# Patient Record
Sex: Male | Born: 1935 | Race: White | Hispanic: No | Marital: Married | Smoking: Former smoker
Health system: Southern US, Community
[De-identification: ages and names within clinical notes are randomized; demographics above are authoritative.]

## PROBLEM LIST (undated history)

## (undated) DIAGNOSIS — E785 Hyperlipidemia, unspecified: Secondary | ICD-10-CM

## (undated) DIAGNOSIS — T7840XA Allergy, unspecified, initial encounter: Secondary | ICD-10-CM

## (undated) DIAGNOSIS — N529 Male erectile dysfunction, unspecified: Secondary | ICD-10-CM

## (undated) DIAGNOSIS — H3320 Serous retinal detachment, unspecified eye: Secondary | ICD-10-CM

## (undated) DIAGNOSIS — K3189 Other diseases of stomach and duodenum: Secondary | ICD-10-CM

## (undated) DIAGNOSIS — G450 Vertebro-basilar artery syndrome: Secondary | ICD-10-CM

## (undated) DIAGNOSIS — G3184 Mild cognitive impairment, so stated: Secondary | ICD-10-CM

## (undated) DIAGNOSIS — I10 Essential (primary) hypertension: Secondary | ICD-10-CM

## (undated) DIAGNOSIS — A0472 Enterocolitis due to Clostridium difficile, not specified as recurrent: Secondary | ICD-10-CM

## (undated) DIAGNOSIS — M1990 Unspecified osteoarthritis, unspecified site: Secondary | ICD-10-CM

## (undated) DIAGNOSIS — K529 Noninfective gastroenteritis and colitis, unspecified: Secondary | ICD-10-CM

## (undated) DIAGNOSIS — J45909 Unspecified asthma, uncomplicated: Secondary | ICD-10-CM

## (undated) DIAGNOSIS — I739 Peripheral vascular disease, unspecified: Secondary | ICD-10-CM

## (undated) DIAGNOSIS — I2510 Atherosclerotic heart disease of native coronary artery without angina pectoris: Secondary | ICD-10-CM

## (undated) DIAGNOSIS — K219 Gastro-esophageal reflux disease without esophagitis: Secondary | ICD-10-CM

## (undated) DIAGNOSIS — H269 Unspecified cataract: Secondary | ICD-10-CM

## (undated) DIAGNOSIS — I6529 Occlusion and stenosis of unspecified carotid artery: Secondary | ICD-10-CM

## (undated) DIAGNOSIS — R42 Dizziness and giddiness: Secondary | ICD-10-CM

## (undated) HISTORY — DX: Enterocolitis due to Clostridium difficile, not specified as recurrent: A04.72

## (undated) HISTORY — DX: Mild cognitive impairment of uncertain or unknown etiology: G31.84

## (undated) HISTORY — DX: Allergy, unspecified, initial encounter: T78.40XA

## (undated) HISTORY — DX: Peripheral vascular disease, unspecified: I73.9

## (undated) HISTORY — DX: Unspecified cataract: H26.9

## (undated) HISTORY — DX: Vertebro-basilar artery syndrome: G45.0

## (undated) HISTORY — PX: RETINAL DETACHMENT SURGERY: SHX105

## (undated) HISTORY — DX: Serous retinal detachment, unspecified eye: H33.20

## (undated) HISTORY — DX: Gastro-esophageal reflux disease without esophagitis: K21.9

## (undated) HISTORY — DX: Essential (primary) hypertension: I10

## (undated) HISTORY — PX: EYE SURGERY: SHX253

## (undated) HISTORY — DX: Atherosclerotic heart disease of native coronary artery without angina pectoris: I25.10

## (undated) HISTORY — DX: Hyperlipidemia, unspecified: E78.5

## (undated) HISTORY — DX: Unspecified osteoarthritis, unspecified site: M19.90

## (undated) HISTORY — PX: UPPER GASTROINTESTINAL ENDOSCOPY: SHX188

## (undated) HISTORY — DX: Unspecified asthma, uncomplicated: J45.909

## (undated) HISTORY — DX: Male erectile dysfunction, unspecified: N52.9

## (undated) HISTORY — DX: Occlusion and stenosis of unspecified carotid artery: I65.29

## (undated) HISTORY — DX: Dizziness and giddiness: R42

## (undated) HISTORY — PX: COLONOSCOPY: SHX174

## (undated) HISTORY — DX: Noninfective gastroenteritis and colitis, unspecified: K52.9

---
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
## 1999-12-06 ENCOUNTER — Encounter: Payer: Self-pay | Admitting: Family Medicine

## 1999-12-06 ENCOUNTER — Encounter: Admission: RE | Admit: 1999-12-06 | Discharge: 1999-12-06 | Payer: Self-pay | Admitting: Family Medicine

## 2000-11-09 ENCOUNTER — Encounter: Admission: RE | Admit: 2000-11-09 | Discharge: 2001-02-07 | Payer: Self-pay | Admitting: Internal Medicine

## 2001-02-26 ENCOUNTER — Encounter: Admission: RE | Admit: 2001-02-26 | Discharge: 2001-02-26 | Payer: Self-pay | Admitting: Family Medicine

## 2001-02-26 ENCOUNTER — Encounter: Payer: Self-pay | Admitting: Family Medicine

## 2001-06-01 ENCOUNTER — Encounter: Admission: RE | Admit: 2001-06-01 | Discharge: 2001-08-30 | Payer: Self-pay | Admitting: Internal Medicine

## 2003-04-25 ENCOUNTER — Encounter: Payer: Self-pay | Admitting: Family Medicine

## 2003-04-25 ENCOUNTER — Encounter: Admission: RE | Admit: 2003-04-25 | Discharge: 2003-04-25 | Payer: Self-pay | Admitting: Family Medicine

## 2003-10-28 DIAGNOSIS — I2510 Atherosclerotic heart disease of native coronary artery without angina pectoris: Secondary | ICD-10-CM

## 2003-10-28 HISTORY — DX: Atherosclerotic heart disease of native coronary artery without angina pectoris: I25.10

## 2004-03-11 ENCOUNTER — Ambulatory Visit (HOSPITAL_COMMUNITY): Admission: RE | Admit: 2004-03-11 | Discharge: 2004-03-12 | Payer: Self-pay | Admitting: Cardiology

## 2004-11-04 ENCOUNTER — Encounter: Admission: RE | Admit: 2004-11-04 | Discharge: 2004-11-04 | Payer: Self-pay | Admitting: Cardiology

## 2005-10-27 HISTORY — PX: PR VEIN BYPASS GRAFT,AORTO-FEM-POP: 35551

## 2006-10-06 ENCOUNTER — Ambulatory Visit: Payer: Self-pay | Admitting: Family Medicine

## 2006-10-06 LAB — CONVERTED CEMR LAB
ALT: 28 units/L (ref 0–40)
AST: 29 units/L (ref 0–37)
Albumin: 3.3 g/dL — ABNORMAL LOW (ref 3.5–5.2)
Alkaline Phosphatase: 55 units/L (ref 39–117)
BUN: 11 mg/dL (ref 6–23)
CO2: 31 meq/L (ref 19–32)
Calcium: 9.1 mg/dL (ref 8.4–10.5)
Chloride: 97 meq/L (ref 96–112)
Creatinine, Ser: 1.2 mg/dL (ref 0.4–1.5)
GFR calc non Af Amer: 64 mL/min
Glomerular Filtration Rate, Af Am: 77 mL/min/{1.73_m2}
Glucose, Bld: 94 mg/dL (ref 70–99)
Potassium: 3.4 meq/L — ABNORMAL LOW (ref 3.5–5.1)
Sodium: 138 meq/L (ref 135–145)
Total Bilirubin: 0.8 mg/dL (ref 0.3–1.2)
Total Protein: 7 g/dL (ref 6.0–8.3)

## 2006-10-19 ENCOUNTER — Ambulatory Visit: Payer: Self-pay | Admitting: Family Medicine

## 2006-10-19 LAB — CONVERTED CEMR LAB: Potassium: 3.6 meq/L (ref 3.5–5.1)

## 2006-10-29 ENCOUNTER — Ambulatory Visit: Payer: Self-pay | Admitting: Gastroenterology

## 2006-11-17 ENCOUNTER — Ambulatory Visit: Payer: Self-pay | Admitting: Family Medicine

## 2006-11-17 LAB — CONVERTED CEMR LAB
BUN: 13 mg/dL (ref 6–23)
GFR calc Af Amer: 70 mL/min
Potassium: 4 meq/L (ref 3.5–5.1)

## 2006-11-20 ENCOUNTER — Encounter (INDEPENDENT_AMBULATORY_CARE_PROVIDER_SITE_OTHER): Payer: Self-pay | Admitting: *Deleted

## 2006-11-20 ENCOUNTER — Ambulatory Visit: Payer: Self-pay | Admitting: Gastroenterology

## 2006-11-20 LAB — HM COLONOSCOPY

## 2006-11-23 ENCOUNTER — Encounter: Payer: Self-pay | Admitting: Gastroenterology

## 2006-12-24 ENCOUNTER — Ambulatory Visit: Payer: Self-pay | Admitting: Gastroenterology

## 2006-12-24 LAB — CONVERTED CEMR LAB
ALT: 37 units/L (ref 0–40)
Albumin: 3.8 g/dL (ref 3.5–5.2)
Alkaline Phosphatase: 54 units/L (ref 39–117)
BUN: 16 mg/dL (ref 6–23)
Basophils Absolute: 0 10*3/uL (ref 0.0–0.1)
CO2: 32 meq/L (ref 19–32)
Calcium: 9.1 mg/dL (ref 8.4–10.5)
Eosinophils Relative: 2.5 % (ref 0.0–5.0)
Ferritin: 58 ng/mL (ref 22.0–322.0)
GFR calc Af Amer: 172 mL/min
HCT: 40.9 % (ref 39.0–52.0)
MCHC: 34.1 g/dL (ref 30.0–36.0)
Neutrophils Relative %: 65.7 % (ref 43.0–77.0)
RBC: 4.61 M/uL (ref 4.22–5.81)
RDW: 12.7 % (ref 11.5–14.6)
Saturation Ratios: 24.9 % (ref 20.0–50.0)
Sed Rate: 10 mm/hr (ref 0–20)
TSH: 2.39 microintl units/mL (ref 0.35–5.50)
Tissue Transglutaminase Ab, IgA: <3 units (ref <5)
Total Protein: 6.8 g/dL (ref 6.0–8.3)
WBC: 5.8 10*3/uL (ref 4.5–10.5)

## 2006-12-25 ENCOUNTER — Encounter: Payer: Self-pay | Admitting: Gastroenterology

## 2006-12-30 ENCOUNTER — Ambulatory Visit: Payer: Self-pay | Admitting: Vascular Surgery

## 2007-01-22 ENCOUNTER — Ambulatory Visit: Payer: Self-pay | Admitting: Family Medicine

## 2007-01-22 LAB — CONVERTED CEMR LAB
BUN: 14 mg/dL (ref 6–23)
Bilirubin, Direct: 0.1 mg/dL (ref 0.0–0.3)
CO2: 33 meq/L — ABNORMAL HIGH (ref 19–32)
GFR calc Af Amer: 108 mL/min
Glucose, Bld: 88 mg/dL (ref 70–99)
Potassium: 3.8 meq/L (ref 3.5–5.1)
Sodium: 142 meq/L (ref 135–145)
TSH: 2.78 microintl units/mL (ref 0.35–5.50)
Total Bilirubin: 1 mg/dL (ref 0.3–1.2)
Total Protein: 6.9 g/dL (ref 6.0–8.3)

## 2007-01-26 ENCOUNTER — Ambulatory Visit: Payer: Self-pay | Admitting: Gastroenterology

## 2007-03-10 ENCOUNTER — Ambulatory Visit: Payer: Self-pay | Admitting: Family Medicine

## 2007-03-10 LAB — CONVERTED CEMR LAB
Basophils Relative: 0.5 % (ref 0.0–1.0)
Eosinophils Absolute: 0.1 10*3/uL (ref 0.0–0.6)
Eosinophils Relative: 2 % (ref 0.0–5.0)
Monocytes Relative: 10.7 % (ref 3.0–11.0)
Platelets: 247 10*3/uL (ref 150–400)
RBC: 4.93 M/uL (ref 4.22–5.81)
WBC: 6.1 10*3/uL (ref 4.5–10.5)

## 2007-03-17 DIAGNOSIS — K5289 Other specified noninfective gastroenteritis and colitis: Secondary | ICD-10-CM | POA: Insufficient documentation

## 2007-03-17 DIAGNOSIS — I10 Essential (primary) hypertension: Secondary | ICD-10-CM | POA: Insufficient documentation

## 2007-03-17 DIAGNOSIS — E785 Hyperlipidemia, unspecified: Secondary | ICD-10-CM | POA: Insufficient documentation

## 2007-03-23 ENCOUNTER — Ambulatory Visit: Payer: Self-pay | Admitting: Psychology

## 2007-03-30 ENCOUNTER — Ambulatory Visit: Payer: Self-pay | Admitting: Gastroenterology

## 2007-04-09 ENCOUNTER — Encounter (INDEPENDENT_AMBULATORY_CARE_PROVIDER_SITE_OTHER): Payer: Self-pay | Admitting: Family Medicine

## 2007-04-27 DIAGNOSIS — G3184 Mild cognitive impairment, so stated: Secondary | ICD-10-CM | POA: Insufficient documentation

## 2007-06-08 ENCOUNTER — Telehealth (INDEPENDENT_AMBULATORY_CARE_PROVIDER_SITE_OTHER): Payer: Self-pay | Admitting: *Deleted

## 2007-06-29 ENCOUNTER — Ambulatory Visit: Payer: Self-pay | Admitting: Gastroenterology

## 2007-08-02 ENCOUNTER — Ambulatory Visit: Payer: Self-pay | Admitting: Internal Medicine

## 2007-08-02 DIAGNOSIS — I739 Peripheral vascular disease, unspecified: Secondary | ICD-10-CM | POA: Insufficient documentation

## 2007-08-02 DIAGNOSIS — G450 Vertebro-basilar artery syndrome: Secondary | ICD-10-CM | POA: Insufficient documentation

## 2007-08-02 DIAGNOSIS — R42 Dizziness and giddiness: Secondary | ICD-10-CM | POA: Insufficient documentation

## 2007-08-17 ENCOUNTER — Telehealth (INDEPENDENT_AMBULATORY_CARE_PROVIDER_SITE_OTHER): Payer: Self-pay | Admitting: *Deleted

## 2007-08-18 ENCOUNTER — Ambulatory Visit: Payer: Self-pay | Admitting: Family Medicine

## 2007-08-19 ENCOUNTER — Telehealth (INDEPENDENT_AMBULATORY_CARE_PROVIDER_SITE_OTHER): Payer: Self-pay | Admitting: *Deleted

## 2007-08-19 ENCOUNTER — Ambulatory Visit: Payer: Self-pay | Admitting: Family Medicine

## 2007-08-19 DIAGNOSIS — H60509 Unspecified acute noninfective otitis externa, unspecified ear: Secondary | ICD-10-CM | POA: Insufficient documentation

## 2007-08-20 ENCOUNTER — Telehealth (INDEPENDENT_AMBULATORY_CARE_PROVIDER_SITE_OTHER): Payer: Self-pay | Admitting: *Deleted

## 2007-08-27 ENCOUNTER — Telehealth (INDEPENDENT_AMBULATORY_CARE_PROVIDER_SITE_OTHER): Payer: Self-pay | Admitting: *Deleted

## 2007-08-30 ENCOUNTER — Ambulatory Visit: Payer: Self-pay | Admitting: Family Medicine

## 2007-09-28 ENCOUNTER — Encounter (INDEPENDENT_AMBULATORY_CARE_PROVIDER_SITE_OTHER): Payer: Self-pay | Admitting: Family Medicine

## 2007-10-18 ENCOUNTER — Ambulatory Visit: Payer: Self-pay | Admitting: Family Medicine

## 2007-10-18 DIAGNOSIS — J069 Acute upper respiratory infection, unspecified: Secondary | ICD-10-CM | POA: Insufficient documentation

## 2007-12-13 ENCOUNTER — Ambulatory Visit: Payer: Self-pay | Admitting: Family Medicine

## 2007-12-14 ENCOUNTER — Telehealth (INDEPENDENT_AMBULATORY_CARE_PROVIDER_SITE_OTHER): Payer: Self-pay | Admitting: *Deleted

## 2007-12-14 ENCOUNTER — Encounter (INDEPENDENT_AMBULATORY_CARE_PROVIDER_SITE_OTHER): Payer: Self-pay | Admitting: *Deleted

## 2007-12-14 LAB — CONVERTED CEMR LAB
ALT: 32 units/L (ref 0–53)
Chloride: 104 meq/L (ref 96–112)
Cholesterol: 120 mg/dL (ref 0–200)
Creatinine, Ser: 1.1 mg/dL (ref 0.4–1.5)
Glucose, Bld: 103 mg/dL — ABNORMAL HIGH (ref 70–99)
HDL: 43.1 mg/dL (ref >39.0)
LDL Cholesterol: 64 mg/dL (ref 0–99)
Potassium: 4.4 meq/L (ref 3.5–5.1)
Sodium: 141 meq/L (ref 135–145)
Triglycerides: 66 mg/dL (ref 0–149)
VLDL: 13 mg/dL (ref 0–40)

## 2007-12-20 ENCOUNTER — Telehealth (INDEPENDENT_AMBULATORY_CARE_PROVIDER_SITE_OTHER): Payer: Self-pay | Admitting: Family Medicine

## 2007-12-20 ENCOUNTER — Ambulatory Visit: Payer: Self-pay | Admitting: Family Medicine

## 2007-12-20 ENCOUNTER — Encounter: Admission: RE | Admit: 2007-12-20 | Discharge: 2007-12-20 | Payer: Self-pay | Admitting: Family Medicine

## 2008-01-11 ENCOUNTER — Ambulatory Visit: Payer: Self-pay | Admitting: Vascular Surgery

## 2008-10-06 ENCOUNTER — Telehealth (INDEPENDENT_AMBULATORY_CARE_PROVIDER_SITE_OTHER): Payer: Self-pay | Admitting: *Deleted

## 2008-12-05 ENCOUNTER — Ambulatory Visit: Payer: Self-pay | Admitting: Family Medicine

## 2008-12-06 ENCOUNTER — Encounter (INDEPENDENT_AMBULATORY_CARE_PROVIDER_SITE_OTHER): Payer: Self-pay | Admitting: *Deleted

## 2008-12-06 LAB — CONVERTED CEMR LAB
Albumin: 4.1 g/dL (ref 3.5–5.2)
BUN: 16 mg/dL (ref 6–23)
Calcium: 9.5 mg/dL (ref 8.4–10.5)
Chloride: 102 meq/L (ref 96–112)
Cholesterol: 112 mg/dL (ref 0–200)
Creatinine, Ser: 1.2 mg/dL (ref 0.4–1.5)
GFR calc Af Amer: 77 mL/min
GFR calc non Af Amer: 63 mL/min
LDL Cholesterol: 56 mg/dL (ref 0–99)
Total Bilirubin: 1.1 mg/dL (ref 0.3–1.2)
Total CHOL/HDL Ratio: 3
Triglycerides: 94 mg/dL (ref 0–149)
VLDL: 19 mg/dL (ref 0–40)

## 2009-06-05 ENCOUNTER — Ambulatory Visit: Payer: Self-pay | Admitting: Family Medicine

## 2009-06-05 DIAGNOSIS — L989 Disorder of the skin and subcutaneous tissue, unspecified: Secondary | ICD-10-CM | POA: Insufficient documentation

## 2009-06-06 ENCOUNTER — Encounter (INDEPENDENT_AMBULATORY_CARE_PROVIDER_SITE_OTHER): Payer: Self-pay | Admitting: *Deleted

## 2009-06-06 LAB — CONVERTED CEMR LAB
ALT: 39 units/L (ref 0–53)
Albumin: 4.1 g/dL (ref 3.5–5.2)
BUN: 17 mg/dL (ref 6–23)
Basophils Relative: 0.3 % (ref 0.0–3.0)
Bilirubin, Direct: 0.2 mg/dL (ref 0.0–0.3)
Chloride: 104 meq/L (ref 96–112)
Cholesterol: 110 mg/dL (ref 0–200)
Eosinophils Relative: 4 % (ref 0.0–5.0)
GFR calc non Af Amer: 69.94 mL/min (ref >60)
HCT: 43.9 % (ref 39.0–52.0)
HDL: 41.2 mg/dL (ref >39.00)
LDL Cholesterol: 52 mg/dL (ref 0–99)
Lymphs Abs: 1.7 10*3/uL (ref 0.7–4.0)
Monocytes Relative: 9.4 % (ref 3.0–12.0)
PSA: 2.37 ng/mL (ref 0.10–4.00)
Platelets: 181 10*3/uL (ref 150.0–400.0)
Potassium: 4.3 meq/L (ref 3.5–5.1)
RBC: 4.93 M/uL (ref 4.22–5.81)
Sodium: 143 meq/L (ref 135–145)
TSH: 2.3 microintl units/mL (ref 0.35–5.50)
Total Protein: 7.1 g/dL (ref 6.0–8.3)
Triglycerides: 84 mg/dL (ref 0.0–149.0)
VLDL: 16.8 mg/dL (ref 0.0–40.0)
WBC: 6.9 10*3/uL (ref 4.5–10.5)

## 2009-06-26 ENCOUNTER — Telehealth (INDEPENDENT_AMBULATORY_CARE_PROVIDER_SITE_OTHER): Payer: Self-pay | Admitting: *Deleted

## 2009-07-26 ENCOUNTER — Encounter: Payer: Self-pay | Admitting: Family Medicine

## 2009-09-27 ENCOUNTER — Encounter: Payer: Self-pay | Admitting: Internal Medicine

## 2009-11-23 ENCOUNTER — Ambulatory Visit: Payer: Self-pay | Admitting: Internal Medicine

## 2009-11-23 ENCOUNTER — Ambulatory Visit: Payer: Self-pay | Admitting: Family

## 2009-11-23 DIAGNOSIS — R053 Chronic cough: Secondary | ICD-10-CM | POA: Insufficient documentation

## 2009-11-23 DIAGNOSIS — R05 Cough: Secondary | ICD-10-CM

## 2009-12-05 ENCOUNTER — Ambulatory Visit: Payer: Self-pay | Admitting: Family

## 2009-12-05 LAB — CONVERTED CEMR LAB
ALT: 28 U/L
AST: 28 U/L
Albumin: 4 g/dL
Alkaline Phosphatase: 61 U/L
BUN: 16 mg/dL
Bilirubin, Direct: 0.1 mg/dL
CO2: 31 meq/L
Calcium: 9.2 mg/dL
Chloride: 104 meq/L
Cholesterol: 104 mg/dL
Creatinine, Ser: 0.9 mg/dL
GFR calc non Af Amer: 88.04 mL/min
Glucose, Bld: 76 mg/dL
HDL: 41.4 mg/dL
LDL Cholesterol: 42 mg/dL
Potassium: 4.1 meq/L
Sodium: 140 meq/L
Total Bilirubin: 0.5 mg/dL
Total CHOL/HDL Ratio: 3
Total Protein: 6.9 g/dL
Triglycerides: 102 mg/dL
VLDL: 20.4 mg/dL

## 2010-07-11 ENCOUNTER — Ambulatory Visit: Payer: Self-pay | Admitting: Family Medicine

## 2010-08-14 ENCOUNTER — Ambulatory Visit: Payer: Self-pay | Admitting: Vascular Surgery

## 2010-10-04 ENCOUNTER — Encounter: Payer: Self-pay | Admitting: Family Medicine

## 2010-10-04 ENCOUNTER — Encounter (INDEPENDENT_AMBULATORY_CARE_PROVIDER_SITE_OTHER): Payer: Self-pay | Admitting: *Deleted

## 2010-10-04 LAB — CONVERTED CEMR LAB
Alkaline Phosphatase: 61 units/L
BUN: 15 mg/dL
CO2, serum: 33 mmol/L
Chloride, Serum: 104 mmol/L
Cholesterol: 118 mg/dL
Creatinine, Ser: 0.97 mg/dL
HDL: 51 mg/dL
LDL Cholesterol: 49 mg/dL
Potassium, serum: 4.5 mmol/L
Sodium, serum: 140 mmol/L
Total Bilirubin: 0.6 mg/dL
Triglycerides: 83 mg/dL

## 2010-10-15 ENCOUNTER — Encounter (INDEPENDENT_AMBULATORY_CARE_PROVIDER_SITE_OTHER): Payer: Self-pay | Admitting: *Deleted

## 2010-10-29 ENCOUNTER — Ambulatory Visit
Admission: RE | Admit: 2010-10-29 | Discharge: 2010-10-29 | Payer: Self-pay | Source: Home / Self Care | Attending: Family Medicine | Admitting: Family Medicine

## 2010-11-17 ENCOUNTER — Encounter: Payer: Self-pay | Admitting: Neurology

## 2010-11-26 NOTE — Assessment & Plan Note (Signed)
Summary: 2 week fu/kdc   Vital Signs:  Patient profile:   75 year old male Weight:      159 pounds Pulse rate:   74 / minute BP sitting:   128 / 70  (left arm)  Vitals Entered By: Doristine Devoid (December 05, 2009 10:45 AM) CC: 2 week roa    CC:  2 week roa .  History of Present Illness: Mr [name] is a 77 year old male who presents today for follow up of his cough.  Last visit his was treated with zithromax, nasonex and as needed tessalon.  Notes that his symptoms have resolved.  Notes that his post-nasal drip has improved with the nasonex.  Denies fever.    Allergies: No Known Drug Allergies  Physical Exam  General:  Well-developed,well-nourished,in no acute distress; alert,appropriate and cooperative throughout examination Neck:  No deformities, masses, or tenderness noted. Lungs:  Normal respiratory effort, chest expands symmetrically. Lungs are clear to auscultation, no crackles or wheezes. Heart:  Normal rate and regular rhythm. S1 and S2 normal without gallop, murmur, click, rub or other extra sounds.   Impression & Recommendations:  Problem # 1:  COUGH (ICD-786.2) Will continue nasonex (patient given sample 0 maa 50 exp 07/2011)  I suspect that his cough was being exacerbated by post-nasal drip.    Problem # 2:  HYPERTENSION (ICD-401.9) Assessment: Unchanged Continue current meds, will check electrolytes as pt is on HCTZ His updated medication list for this problem includes:    Hydrochlorothiazide 25 Mg Tabs (Hydrochlorothiazide) .Marland Kitchen... 1 by mouth qd    Coreg 6.25 Mg Tabs (Carvedilol) .Marland Kitchen... Take one tablet two times a day  BP today: 128/70 Prior BP: 130/68 (11/23/2009)  Labs Reviewed: K+: 4.3 (06/05/2009) Creat: : 1.1 (06/05/2009)   Chol: 110 (06/05/2009)   HDL: 41.20 (06/05/2009)   LDL: 52 (06/05/2009)   TG: 84.0 (06/05/2009)  Orders: Venipuncture ([address]) TLB-BMP (Basic Metabolic Panel-BMET) (80048-METABOL)  Problem # 3:  HYPERLIPIDEMIA  (ICD-272.4) Assessment: Comment Only plan to check FLP and LFT's His updated medication list for this problem includes:    Lipitor 40 Mg Tabs (Atorvastatin calcium) .Marland Kitchen... 1 by mouth qhs    Zetia 10 Mg Tabs (Ezetimibe) .Marland Kitchen... 1 by mouth qd  Orders: TLB-Hepatic/Liver Function Pnl (80076-HEPATIC) TLB-Lipid Panel (80061-LIPID)  Complete Medication List: 1)  Lipitor 40 Mg Tabs (Atorvastatin calcium) .Marland Kitchen.. 1 by mouth qhs 2)  Hydrochlorothiazide 25 Mg Tabs (Hydrochlorothiazide) .Marland Kitchen.. 1 by mouth qd 3)  Zetia 10 Mg Tabs (Ezetimibe) .Marland Kitchen.. 1 by mouth qd 4)  Aricept 10 Mg Tabs (Donepezil hcl) .Marland Kitchen.. 1 by mouth qd 5)  Coreg 6.25 Mg Tabs (Carvedilol) .... Take one tablet two times a day 6)  Nasonex 50 Mcg/act Susp (Mometasone furoate) .... 2 sprays each nostril daily  Patient Instructions: 1)  Please complete lab work today prior to leaving 2)  Call if you develop recurrent cough. 3)  Follow up in 6 months for yearly exam- sooner if problems or concerns 4)  Keep your follow up appointment with Dr. Mayford Knife Prescriptions: NASONEX 50 MCG/ACT SUSP (MOMETASONE FUROATE) 2 sprays each nostril daily  #1 x 2   Entered and Authorized by:   Lemont Fillers FNP   Signed by:   Lemont Fillers FNP on 12/05/2009   Method used:   Electronically to        [facility] [phone]* (retail)       [address]       [place]  [place], [place]  [address]       Ph: [phone]       Fax: [phone]   RxID:   [id number]

## 2010-11-26 NOTE — Assessment & Plan Note (Signed)
Summary: FLU VAC.CBS   Nurse Visit   Allergies: No Known Drug Allergies  Orders Added: 1)  Flu Vaccine 42yrs + MEDICARE PATIENTS [Q2039] 2)  Administration Flu vaccine - MCR [G0008] Flu Vaccine Consent Questions     Do you have a history of severe allergic reactions to this vaccine? no    Any prior history of allergic reactions to egg and/or gelatin? no    Do you have a sensitivity to the preservative Thimersol? no    Do you have a past history of Guillan-Barre Syndrome? no    Do you currently have an acute febrile illness? no    Have you ever had a severe reaction to latex? no    Vaccine information given and explained to patient? yes    Are you currently pregnant? no    Lot Number:[id number]   Exp Date:04/26/2011   Site Given  Left Deltoid IM

## 2010-11-26 NOTE — Assessment & Plan Note (Signed)
Summary: cough/kdc   Vital Signs:  Patient profile:   76 year old male Weight:      162 pounds O2 Sat:      96 % on Room air Temp:     98.6 degrees F oral Pulse rate:   72 / minute BP sitting:   130 / 68  (left arm)  Vitals Entered By: Doristine Devoid (November 23, 2009 9:12 AM)  O2 Flow:  Room air  History of Present Illness: Mr [name] is a 76 year old male who presents today with c/o cough. This cough has been present for "months".  But notes that it became worse last night.  He does cough up phlegm, but has not noted color or character of phlegm "because I swallow it."  Denies associated fever.   Notes that his cough is worse at night.  Notes that he took 3 doses of Tussin DM last night without improvement.  Never smoked.  Denies any history of asthma.  He thinks that he may have some post- nasal drip, but denies significant nasal congestion.  Notes that when he does blow his nose it is clear.  He denies any symptoms of reflux.   Allergies: No Known Drug Allergies  Physical Exam  General:  Elderly white male, NAD Head:  Normocephalic and atraumatic without obvious abnormalities. No apparent alopecia or balding. Ears:  TM's partially occluded by cerumen Mouth:  Oral mucosa and oropharynx without lesions or exudates.   Neck:  No deformities, masses, or tenderness noted. Lungs:  Normal respiratory effort, chest expands symmetrically. Lungs are clear to auscultation, no crackles or wheezes. Heart:  Normal rate and regular rhythm. S1 and S2 normal without gallop, murmur, click, rub or other extra sounds.   Impression & Recommendations:  Problem # 1:  COUGH (ICD-786.2) Prescription given for nasonex- 0 MAA 49, exp oct 2012 (2 samples) I suspect post-nasal drip is playing a role.  Will give trial of nasonex.  Will also cover empirically for bronchitis with zithromax and order baseline chest x-ray to further evaluate.  Will treat cough with tessalon as needed.  Patient instructed to f/u  in 2 weeks, sooner if fever or symptoms worsen.   Orders: T-2 View CXR (71020TC)  Complete Medication List: 1)  Lipitor 40 Mg Tabs (Atorvastatin calcium) .Marland Kitchen.. 1 by mouth qhs 2)  Hydrochlorothiazide 25 Mg Tabs (Hydrochlorothiazide) .Marland Kitchen.. 1 by mouth qd 3)  Zetia 10 Mg Tabs (Ezetimibe) .Marland Kitchen.. 1 by mouth qd 4)  Aricept 10 Mg Tabs (Donepezil hcl) .Marland Kitchen.. 1 by mouth qd 5)  Coreg 6.25 Mg Tabs (Carvedilol) .... Take one tablet two times a day 6)  Zithromax Z-pak 250 Mg Tabs (Azithromycin) .... 2 tabs by mouth today, then one tablet by mouth daily x 4 more days. 7)  Tessalon Perles 100 Mg Caps (Benzonatate) .... One cap by mouth three times a day as needed cough 8)  Nasonex 50 Mcg/act Susp (Mometasone furoate) .... 2 sprays each nostril daily  Patient Instructions: 1)  Your chest x-ray may be completed a the [place] office at [address] or at the [facility] in [place] at the corner of [address] and [address]. 2)  Call if your symptoms worsen or if you develop fever over 101. 3)  Please schedule a follow-up appointment in 2 weeks. Prescriptions: TESSALON PERLES 100 MG CAPS (BENZONATATE) one cap by mouth three times a day as needed cough  #30 x 1   Entered and  Authorized by:   Lemont Fillers FNP   Signed by:   Lemont Fillers FNP on 11/23/2009   Method used:   Electronically to        [facility] [phone]* (retail)       [address]       [place], [place]  [address]       Ph: [phone]       Fax: [phone]   RxID:   [id number] ZITHROMAX Z-PAK 250 MG TABS (AZITHROMYCIN) 2 tabs by mouth today, then one tablet by mouth daily x 4 more days.  #1 pack x 0   Entered and Authorized by:   Lemont Fillers FNP   Signed by:   Lemont Fillers FNP on 11/23/2009   Method used:   Electronically to        [facility] [phone]* (retail)       [address]       [place], [place]  [address]       Ph: [phone]       Fax: [phone]   RxID:    [id number]

## 2010-11-26 NOTE — Letter (Signed)
   [facility] [address] [place], [place] [address] [phone]     December 05, 2009   [name] [address] [place], [place] [address]  RE:  LAB RESULTS  Dear  [name],  The following is an interpretation of your most recent lab tests.  Please take note of any instructions provided or changes to medications that have resulted from your lab work.  ELECTROLYTES:  Good - no changes needed  KIDNEY FUNCTION TESTS:  Good - no changes needed  LIVER FUNCTION TESTS:  Good - no changes needed  LIPID PANEL:  Good - no changes needed Triglyceride: 102.0   Cholesterol: 104   LDL: 42   HDL: 41.40   Chol/HDL%:  3   DIABETIC STUDIES:  Excellent - no changes needed Blood Glucose: 76     CBC:  Good - no changes needed   Sincerely Yours,    Lemont Fillers FNP

## 2010-11-28 NOTE — Assessment & Plan Note (Signed)
Summary: shingles shot///sph   Nurse Visit   Allergies: No Known Drug Allergies  Immunizations Administered:  Zostavax # 1:    Vaccine Type: Zostavax    Site: right [place]    Mfr: Merck    Dose: 0.53ml     Route: [place]    Given by: Jeremy Johann CMA    Exp. Date: 08/28/2011    Lot #: [id number]    VIS given: 08/08/05 given October 29, 2010.  Orders Added: 1)  Zoster (Shingles) Vaccine Live [90736] 2)  Admin 1st Vaccine [phone]

## 2010-11-28 NOTE — Miscellaneous (Signed)
   Clinical Lists Changes  Observations: Added new observation of TRIGLYC TOT: 83 mg/dL (16/07/9603 54:09) Added new observation of LDL: 49 mg/dL (81/19/1478 29:56) Added new observation of HDL: 51 mg/dL (21/30/8657 84:69) Added new observation of CHOLESTEROL: 118 mg/dL (62/95/2841 32:44) Added new observation of BILI TOTAL: 0.6 mg/dL (10/29/7251 66:44) Added new observation of ALK PHOS: 61 units/L (10/04/2010 15:28) Added new observation of SGPT (ALT): 33 units/L (10/04/2010 15:28) Added new observation of SGOT (AST): 34 units/L (10/04/2010 15:28) Added new observation of ALBUMIN: 4.4 g/dL (03/47/4259 56:38) Added new observation of CALCIUM: 9.0 mg/dL (75/64/3329 51:88) Added new observation of GLUCOSE SER: 98 mg/dL (41/66/0630 16:01) Added new observation of CREATININE: 0.97 mg/dL (09/32/3557 32:20) Added new observation of BUN: 15 mg/dL (25/42/7062 37:62) Added new observation of CO2 TOTAL: 33 mmol/L (10/04/2010 15:28) Added new observation of CHLORIDE: 104 mmol/L (10/04/2010 15:28) Added new observation of POTASSIUM: 4.5 mmol/L (10/04/2010 15:28) Added new observation of SODIUM: 140 mmol/L (10/04/2010 15:28)

## 2011-01-13 ENCOUNTER — Telehealth: Payer: Self-pay | Admitting: Family Medicine

## 2011-01-23 NOTE — Progress Notes (Signed)
Summary: Refill--Lipitor/[facility]  Phone Note Refill Request Message from:  Patient on January 13, 2011 9:48 AM  Refills Requested: Medication #1:  LIPITOR 40 MG  TABS 1 by mouth qhs Patient stopped by the office and requested refills of the above be sent to [facility].  Initial call taken by: Lucious Groves CMA,  January 13, 2011 9:48 AM    Prescriptions: LIPITOR 40 MG  TABS (ATORVASTATIN CALCIUM) 1 by mouth qhs  #90 Tablet x 2   Entered by:   Lucious Groves CMA   Authorized by:   Neena Rhymes MD   Signed by:   Lucious Groves CMA on 01/13/2011   Method used:   Electronically to        [facility]* (retail)             ,          Ph: [phone]       Fax: [phone]   RxID:   [id number]

## 2011-02-26 ENCOUNTER — Encounter: Payer: Self-pay | Admitting: Family Medicine

## 2011-02-26 ENCOUNTER — Ambulatory Visit (INDEPENDENT_AMBULATORY_CARE_PROVIDER_SITE_OTHER): Payer: Medicare Other | Admitting: Family Medicine

## 2011-02-26 VITALS — BP 150/64 | Temp 98.3°F | Wt 158.4 lb

## 2011-02-26 DIAGNOSIS — L237 Allergic contact dermatitis due to plants, except food: Secondary | ICD-10-CM | POA: Insufficient documentation

## 2011-02-26 DIAGNOSIS — L255 Unspecified contact dermatitis due to plants, except food: Secondary | ICD-10-CM

## 2011-02-26 MED ORDER — PREDNISONE 20 MG PO TABS: ORAL_TABLET | ORAL | Status: DC

## 2011-02-26 NOTE — Patient Instructions (Signed)
Take the Prednisone as directed for itching and inflammation- take w/ food to avoid upset stomach.  Take both tabs at the same time Continue the Triamcinolone cream as needed for the itching Benadryl at night to help w/ sleep and itch Make sure that everything that came in contact w/ the plant oil has been washed Call with any questions or concerns Hang in there!!!

## 2011-02-26 NOTE — Assessment & Plan Note (Signed)
Given extensive involvement of R arm will start steroid burst to control sxs.  Reviewed supportive care and red flags that should prompt return.  Pt expressed understanding and is in agreement w/ plan.

## 2011-02-26 NOTE — Progress Notes (Signed)
  Subjective:    Patient ID: [name], male    DOB: [birth year], 76 y.o.   MRN: [id number]  HPI Poison [place]- was pulling poison ivy out of the yard on Saturday and 'it got the best of me'.  Very itchy.  Isolated to R forearm and elbow.  Used gloves.  But was wearing short sleeved shirt while grabbing it to bag.  Has been using Triamcinolone/Eucerin mix and Zyrtec for itching w/ some relief.   Review of Systems For ROS see HPI     Objective:   Physical Exam  Skin: Skin is warm and dry. Rash noted.       + erythematous, vesicular rash on R wrist, forearm, elbow.  No other areas involved.          Assessment & Plan:

## 2011-02-27 ENCOUNTER — Telehealth: Payer: Self-pay | Admitting: Family Medicine

## 2011-02-27 MED ORDER — TRIAMCINOLONE ACETONIDE 0.1 % EX OINT: TOPICAL_OINTMENT | Freq: Two times a day (BID) | CUTANEOUS | Status: AC

## 2011-02-27 NOTE — Telephone Encounter (Signed)
Pt needs a rx for triamcinolone cream. Send to  [facility] on [address].

## 2011-02-27 NOTE — Telephone Encounter (Signed)
Pt.notified

## 2011-02-27 NOTE — Telephone Encounter (Signed)
Ok for triamcinolone 0.1% ointment.  Apply bid to affected area.  Disp 1 large tube w/ 1 refill.

## 2011-02-27 NOTE — Telephone Encounter (Signed)
Please advise of rx above.

## 2011-03-07 ENCOUNTER — Telehealth: Payer: Self-pay | Admitting: Family Medicine

## 2011-03-07 MED ORDER — PREDNISONE 20 MG PO TABS: ORAL_TABLET | ORAL | Status: DC

## 2011-03-07 NOTE — Telephone Encounter (Signed)
Pt wants to know if he can have his Prednisone 20 mg refilled because he still has poison ivy on his right arm. Original RX has no refills.[facility] [address]

## 2011-03-07 NOTE — Telephone Encounter (Signed)
Ok to refill or do you prefer ov?

## 2011-03-07 NOTE — Telephone Encounter (Signed)
Pt.notified

## 2011-03-07 NOTE — Telephone Encounter (Signed)
Ok for refill but if sxs don't improve w/ this round will need OV

## 2011-03-11 NOTE — Procedures (Signed)
CAROTID DUPLEX EXAM   INDICATION:  Carotid stenosis.   HISTORY:  Diabetes:  No.  Cardiac:  Stent, 2005.  Hypertension:  Yes.  Smoking:  No.  Previous Surgery:  CV History:  Amaurosis Fugax , Paresthesias , Hemiparesis                                       RIGHT             LEFT  Brachial systolic pressure:         125               130  Brachial Doppler waveforms:         Within normal limits                Within normal limits  Vertebral direction of flow:        Antegrade         Antegrade  DUPLEX VELOCITIES (cm/sec)  CCA peak systolic                   105               110  ECA peak systolic                   108               104  ICA peak systolic                   92                172  ICA end diastolic                   24                48  PLAQUE MORPHOLOGY:                  Heterogenous      Heterogenous  PLAQUE AMOUNT:                      Minimal           Minimal  PLAQUE LOCATION:                    Bifurcation       ICA   IMPRESSION:  1. Right internal carotid artery velocities suggest 1% to 39%      stenosis.  2. Left internal carotid artery velocities suggest 40% to 59%      stenosis.   ___________________________________________  Di Kindle. Edilia Bo, M.D.   EM/MEDQ  D:  08/14/2010  T:  08/14/2010  Job:  [id number]

## 2011-03-11 NOTE — Procedures (Signed)
CAROTID DUPLEX EXAM   INDICATION:  Followup of known carotid artery disease.  Patient is  asymptomatic.   HISTORY:  Diabetes:  No.  Cardiac:  Stent in 2005.  Hypertension:  Yes.  Smoking:  No.  Previous Surgery:  CV History:  Amaurosis Fugax No, Paresthesias No, Hemiparesis No                                       RIGHT             LEFT  Brachial systolic pressure:         120               124  Brachial Doppler waveforms:         WNL               WNL  Vertebral direction of flow:        Antegrade         Antegrade  DUPLEX VELOCITIES (cm/sec)  CCA peak systolic                   96                108  ECA peak systolic                   119               99  ICA peak systolic                   117               221  ICA end diastolic                   21                62  PLAQUE MORPHOLOGY:                  Heterogenous      Heterogenous  PLAQUE AMOUNT:                      Mild              Moderate-to-severe  PLAQUE LOCATION:                    Bifurcation, ICA  Bifurcation, ICA   IMPRESSION:  1. Right 40-59% internal carotid artery stenosis (low end of range).  2. Right 60-79% internal carotid artery stenosis.  3. Patent external carotid arteries.  4. Bilateral antegrade flow and vertebral arteries.   ___________________________________________  Di Kindle. Edilia Bo, M.D.   PB/MEDQ  D:  01/11/2008  T:  01/11/2008  Job:  [id number]

## 2011-03-11 NOTE — Assessment & Plan Note (Signed)
OFFICE VISIT   AUSTEN, OYSTER  DOB:  1935/02/06                                       08/14/2010  OZHYQ#:[id number]   I saw patient in the office today for continued follow-up of his carotid  disease.  He was last seen in 2008.  His most recent carotid duplex scan  was in March 2009.  He had a 40% to 59% right carotid stenosis in the  low end of that range, and on the left side he had evidence of a 60% to  79% stenosis.  He comes in for a routine follow-up visit.  Since I saw  him last, he has had no history of stroke, TIAs, expressive or receptive  aphasia, or amaurosis fugax.   His past medical history is significant for hypertension and  hypercholesterolemia, both of which are stable on his current  medications.  He denies any history of diabetes, history of previous  myocardial infarction, history of congestive heart failure, or history  of COPD.   SOCIAL HISTORY:  He is married.  He has 3 children.  He does not smoke  cigarettes.   REVIEW OF SYSTEMS:  CARDIOVASCULAR:  He had no chest pain, chest  pressure, palpitations, or arrhythmias.  PULMONARY:  He has had no productive cough, bronchitis, asthma, or  wheezing.   PHYSICAL EXAMINATION:  This is a pleasant 76 year old gentleman who  appears his stated age.  Blood pressure is 146/70.  Heart rate is 64,  saturation 97%.  Lungs are clear bilaterally to auscultation without  rales, rhonchi, or wheezing.  On cardiovascular exam, I do not detect  any carotid bruits.  He has a regular rate and rhythm.  He has palpable  femoral pulses with warm, well-perfused feet.  No ischemic ulcers.  Abdomen is soft and nontender with normal-pitched bowel sounds.  Musculoskeletal:  There are no major deformities or cyanosis.  Neurologic:  He has no focal weakness or paresthesias.   I independently interpreted his carotid duplex scan, which shows a less  than 39% right carotid stenosis and a 40% to 59% left carotid  stenosis.  His velocities have actually decreased, compared to his most recent  study.   I think it is safe to have a follow-up in 1 year, and I have ordered a  follow-up carotid duplex scan in 1 year.  I will see him back in 2 years  unless he has a change in his duplex scan.  He knows to call sooner if  he has problems.  In the meantime, he knows to continue taking his  aspirin.     Di Kindle. Edilia Bo, M.D.  Electronically Signed   CSD/MEDQ  D:  08/14/2010  T:  08/15/2010  Job:  [id number]

## 2011-03-11 NOTE — Assessment & Plan Note (Signed)
[facility]                                 ON-CALL NOTE   NAME:[name], [name]                          MRN:          [id number]  DATE:12/12/2007                            DOB:          10/13/1939    PHONE NUMBER:  [phone].  Patient of Dr. Blossom Hoops.   Wife calling because he is in severe pain.  Advised them take to  emergency room for evaluation.     Jeffrey A. Tawanna Cooler, MD  Electronically Signed    JAT/MedQ  DD: 12/12/2007  DT: 12/13/2007  Job #: [id number]

## 2011-03-11 NOTE — Letter (Signed)
March 30, 2007    [name], M.D.  [name]  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 He was on Canasa suppositories but has  been able to discontinue this preparation. I have asked him to continue  Lialda 2.4 grams a day for another 3 months and then to check back with  me for followup. He is to call if he has any recurrent proctitis type of  symptoms. Thank you for referring me this most interesting, pleasant  patient.    Sincerely,      Vania Rea. Jarold Motto, MD, Caleen Essex, FAGA  Electronically Signed    DRP/MedQ  DD: 03/30/2007  DT: 03/30/2007  Job #: [id number]

## 2011-03-14 NOTE — Assessment & Plan Note (Signed)
[facility]                        [name] NOTE   NAME:[name], [name]                          MRN:          [id number]  DATE:11/17/2006                            DOB:          08-18-1935    REASON FOR VISIT:  Followup on diarrhea.   [name] is a 76 year old male who saw me back in December with  diarrhea.  Subsequently, the stool culture came back with C. difficile.  He was treated with Flagyl and at the same time was referred to  Gastroenterology.  At the time that he saw Gastroenterology, the patient  reported that his symptoms had continued and additional antibiotics were  added.  His symptoms have not improved significantly, so he is scheduled  for a colonoscopy this Friday.  The patient is otherwise doing well and  has no other specific complaints except for dry skin.  The patient was  seen by Dr. Terri Piedra in the past for a similar problem and was prescribed  a lotion.  The patient believes that he still has the bottle at home.   MEDICATIONS:  1. Zetia 10 mg.  2. Hydrochlorothiazide 25 mg.  3. Lipitor 40 mg.  4. Coreg 6.25 mg b.i.d.   OBJECTIVE:  Weight 152, pulse 74, blood pressure 130/80.  GENERAL:  We have a pleasant male in no acute distress.  LUNGS:  Clear.  HEART:  Regular rate and rhythm.  ABDOMEN:  Soft and non-distended.  No palpable masses.  No  hepatosplenomegaly.  No rebound or guarding.   IMPRESSION:  1. 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  He is followed closely by Gastroenterology and      is scheduled to have a colonoscopy this Friday.  2. Xerosis.   PLAN:  1. Patient to follow up with me as needed and will continue with      Gastroenterology.  2. In regards to his dry skin, he is going to call me with lotion that      was prescribed by Dr. Terri Piedra and I will refill if appropriate.      Additionally, I reviewed skin care including good hydration  and      moisturizing of the skin.     [name], M.D.  Electronically Signed    LA/MedQ  DD: 11/17/2006  DT: 11/17/2006  Job #: [id number]

## 2011-03-14 NOTE — Assessment & Plan Note (Signed)
[facility]                         GASTROENTEROLOGY OFFICE NOTE   NAME:[name], [name]                          MRN:          [id number]  DATE:12/24/2006                            DOB:          [birth year]    Haron is doing better but still has several loose bowel movements a day  after completing courses of metronidazole and Xifaxan therapy for  presumed C. difficile disease. When he had a colonoscopy on January 25,  his colon exam was negative except for some mild proctitis confirmed by  rectal biopsies. Repeat C. difficile toxin on November 24, 2006 was  negative.   The patient relates that he has had loose stools for at least a year,  and he feels this is possibly related to his Coreg medication. He  additionally takes Zetia 10 mg a day, Lipitor 40 mg a day, HCTZ 25 mg a  day, and 325 mg of aspirin a day. He is currently using Align probiotic  therapy and Canasa suppositories 1 g at night.   He denies systemic complaints or any upper GI hepatobiliary problems. He  is following a fairly regular diet and denies any specific food  intolerances.   PHYSICAL EXAMINATION:  VITAL SIGNS:  He weighs 153 pounds which is down  3 pounds from October 29, 2006. Blood pressure is 138/68 and pulse was 80  and regular.  GENERAL:  Not repeated.   ASSESSMENT:  I am concerned about Mr. Negron continued diarrhea  despite treatment for C. difficile. It certainly is possible he has  underlying inflammatory bowel disease versus other occult causes of  chronic diarrhea. It certainly is possible this is a drug side effect  problem.   RECOMMENDATIONS:  1. Complete screening labs including IBD serologies.  2. Multiple stool exams including culture, O&P, repeat C. difficile      toxin, [place] fat staining.  3. Check serum trypsin level, sprue panel, serum carotin, and      metabolic profiles.  4. Continue all other medication as listed above with GI followup in  several weeks time.     Vania Rea. Jarold Motto, MD, Caleen Essex, FAGA  Electronically Signed    DRP/MedQ  DD: 12/24/2006  DT: 12/24/2006  Job #: [id number]   cc:   [name], M.D.

## 2011-03-14 NOTE — Cardiovascular Report (Signed)
NAME:  [name], [name] NO.:  [id number]   MEDICAL RECORD NO.:  [id number]                   PATIENT TYPE:  OIB   LOCATION:  6532                                 FACILITY:  [facility]   PHYSICIAN:  Francisca December, M.D.               DATE OF BIRTH:  Sep 08, 1935   DATE OF PROCEDURE:  03/11/2004  DATE OF DISCHARGE:                              CARDIAC CATHETERIZATION   PROCEDURE:  Percutaneous intervention/drug-eluting stent implantation  proximal right coronary artery.   CARDIOLOGIST:  Francisca December, M.D.   INDICATIONS:  [name] is a 76 year old man with typical exertional chest  pain and abnormal Cardiolite indicative of inferior wall ischemia.  Dr.  Armanda Magic has completed coronary angiography revealing a subtotal  stenosis of the proximal right coronary.  She is to undergo catheter based  revascularization at this time.   DESCRIPTION OF PROCEDURE:  Through the previously placed 6 French catheter  sheath a 6 [place] FR4 guiding catheter was advanced to the ascending aorta  where the right coronary os was engaged.  A 0.014 inch Sci-Med luge  intracoronary guidewire was passed across the lesion in the proximal right  coronary without difficulty.  The lesion was primarily stented using a  3.5/13 mm Cypher drug-eluting intracoronary stent.  This was deployed across  the lesion and inflated to a peak pressure of 14 atmospheres for 45 seconds.  The stent deployment balloon was removed, and a 3.75/8 mm Sci-Med Quantum  Maverick intracoronary balloon was placed within the stented segment.  This  was inflated to a peak pressure of 20 atmospheres for 30 seconds.  This  balloon was deflated and removed.  Following confirmation of adequate  patency in orthogonal views both and with and without the guidewire in place  the guiding catheter was removed.  Hemostasis was achieved by deployment of  the AngioSeal percutaneous closure system.  The patient was  transported to  the recovery area in stable condition with an intact distal pulse.   ANGIOGRAPHY:  As mentioned, the lesion treated was in the proximal portion  of the right coronary, about 5-6 mm in length, concentric and subtotally  stenotic.  This was about 95%.  Following balloon dilatation and stent  implantation in this low risk lesion, there was no residual stenosis.   FINAL IMPRESSION:  1. Atherosclerotic coronary vascular disease, single vessel.  2. Status post successful percutaneous intervention/drug-eluting stent     implantation proximal right coronary artery.                                               Francisca December, M.D.    JHE/MEDQ  D:  03/11/2004  T:  03/11/2004  Job:  [id number]   cc:   Armanda Magic, M.D.  [address], [address]  [place], [place] [address]  Fax: [phone]   Maryla Morrow. Modesto Charon, M.D.  [address]  [place]  [place] [address]  Fax: [phone]

## 2011-03-14 NOTE — Assessment & Plan Note (Signed)
[facility]                         GASTROENTEROLOGY OFFICE NOTE   NAME:[name], [name]                          MRN:          [id number]  DATE:10/29/2006                            DOB:          Jun 07, 1935    [name] is a 76 year old white male referred by Dr. Blossom [name] for  diarrhea of approximately 3 months' duration.   [name] has no abdominal pain, cramping, or systemic complaints.  He  has had 3 months of 8 to 10 bowel movements a day, a small-volume watery-  type stool without blood in his stool.  He has had no fever, chills,  arthritis, rashes, or other systemic complaints.  He denies infectious  disease exposure, travel, or sick family members at home.  He does have  grandchildren, but does not change diapers.  He has multiple cats at  home, but no dogs.  He has not used well water or mountain stream water.   He was seen by Dr. Blossom [name] and had a stool exam, which showed positive  Clostridium difficile toxin.  He has been started on metronidazole 250  mg t.i.d. for the last 2 days, and has had some improvement.  He denies  upper GI or hepatobiliary complaints.  He has no history of immune  deficiency or other known medical problems along those lines.   PAST MEDICAL HISTORY:  He does suffer from essential hypertension, has  had previous coronary artery disease with stent placed several years  ago.  In addition, he has hypercholesterolemia.  He had an appendectomy  in 1956.   FAMILY HISTORY:  Remarkable for colon cancer in his father.   SOCIAL HISTORY:  He is married.  He lives with his wife.  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  He does not smoke.  He uses  ethanol socially.  He denies problems with alcoholism.   REVIEW OF SYSTEMS:  Noncontributory.  Without active cardiovascular,  pulmonary, or systemic complaints.  Review of systems otherwise  noncontributory.   EXAMINATION:  He is a healthy-appearing nontoxic white  male in no  distress.  Appearing his stated age.  He is 5 feet 6 inches tall and weighs 155 pounds.  His blood pressure is  108/70 and pulse was 72 and regular.  I could not appreciate stigmata of chronic liver disease.  CHEST:  Clear.  No murmurs, gallops, or rubs noted.  He was in a regular rhythm.  There is no hepatosplenomegaly, abdominal masses, tenderness, or  distension.  Bowel sounds were normal.  Peripheral extremities were unremarkable.  Mental status was clear.  RECTAL:  Exam was deferred.   ASSESSMENT:  1. Clostridium difficile colitis of unexplained etiology without any      history whatsoever of immune deficiency or antibiotic exposure.  He      recently had laboratory data that showed normal CBC and metabolic      profile, except a slightly low albumin of 3.3g%.  It is certainly      possible that he has underlying inflammatory bowel disease,      although  I do think this is unlikely.  2. Family history of colon carcinoma in his father with need for      screening colonoscopy.  3. Hypertensive cardiovascular disease, coronary artery disease with      previous stenting in May of 2005.   RECOMMENDATIONS:  1. Metronidazole 250 mg q.i.d. for 10 days, along with Florastor      probiotic therapy in the form of saccharomyces replacement therapy.  2. Colonoscopy in 3 weeks' time.  3. Other medications as per Dr. Blossom [name].     Vania Rea. Jarold Motto, MD, Caleen Essex, FAGA  Electronically Signed    DRP/MedQ  DD: 10/29/2006  DT: 10/29/2006  Job #: [id number]   cc:   [name], M.D.

## 2011-03-14 NOTE — Assessment & Plan Note (Signed)
[name]                        [name] NOTE   NAME:[name], [name]                          MRN:          [id number]  DATE:01/22/2007                            DOB:          May 04, 1935    [name] presents today with his son.  It appears that his family is  concerned about his memory difficulty.  According to his son, [name]  appears to be having significant memory lapse.  It has been getting  worse over the last several months.  Of note, he was seen back in August  at [facility] where they did a pretty extensive evaluation which was  unremarkable.  He was thus recommended to a neurologist, but at that  point, patient did not follow up.  Additionally, they recommended  psychiatric evaluation.   [name] states that he feels his symptoms are due to significant  stressors at work.  He feels overwhelmed.  [name] states that he is  just not able to focus and this may be attributed to his lapse in  memory.  His son disagrees and feels it may be something else.  There is  some concern about significant mood swings, irritability and crying  spells.  There is no suicidal or homicidal ideations.   PAST MEDICAL HISTORY:  1. Hypertension.  2. Hyperlipidemia.  3. Coronary artery disease status post stent.  4. Colitis.   PAST SURGICAL HISTORY:  Appendectomy.   MEDICATIONS:  1. Zetia 10 mg.  2. Hydrochlorothiazide 20 mg.  3. Lipitor 40 mg.  4. Coreg 6.25 mg b.i.d.  5. Aspirin 81 mg.   REVIEW OF SYSTEMS:  As per HPI.   OBJECTIVE:  GENERAL APPEARANCE:  This is a pleasant male whose mood  appears slightly down but answers questions appropriately.  VITAL SIGNS:  Weight 165.8, pulse 78, blood pressure 142/72.  HEENT:  Unremarkable.  NECK:  Supple.  No lymphadenopathy, carotid bruits or JVD.  LUNGS:  Clear.  CARDIOVASCULAR:  Regular rate and rhythm.  No murmurs, rubs, or gallops.  NEUROLOGIC:  Patient alert and oriented x3.   Patient able to draw the  face of a clock at 4:15 without difficulty.   IMPRESSION:  A 76 year old male with a one-year history of memory loss.  There is significant family concern, although patient attributes it to  increased stress at work.  Etiology includes Alzheimer's versus  depression or a combination of both.  I reviewed medical records from  [place] and CT scan was performed which was unremarkable except for  sinusitis.  He also had some routine blood work which was also  unremarkable.   PLAN:  1. I did have a long discussion with both [name] and his son      regarding further evaluation.  I did discuss my differential but      patient was very adamant that he was not depressed.  He      nevertheless did agree to have further evaluation by neurology and      I will refer him to Dr. Clarisse Gouge.  2. Will obtain some  routine blood work including comprehensive      metabolic profile, CBC, RPR, Lyme titer, TSH, B12 and folate.  3. Further recommendations after the above evaluation.     [name], M.D.  Electronically Signed    LA/MedQ  DD: 01/22/2007  DT: 01/22/2007  Job #: [id number]

## 2011-03-14 NOTE — Cardiovascular Report (Signed)
NAME:  [name], [name] 03/11/2004                  ACCOUNT NO.:  [id number]   MEDICAL RECORD NO.:  [id number]                   PATIENT TYPE:  OIB   LOCATION:  2852                                 FACILITY:  [facility]   PHYSICIAN:  Armanda Magic, M.D.                  DATE OF BIRTH:  Jun 11, 1935   DATE OF PROCEDURE:  DATE OF DISCHARGE:                              CARDIAC CATHETERIZATION   PROCEDURE:  Left heart catheterization, coronary angiography, left  ventriculography.   CARDIOLOGIST:  Armanda Magic, M.D.   REFERRING PHYSICIAN:  Francis P. Modesto Charon, M.D.   INDICATIONS:  Chest pain and abnormal Cardiolite.   COMPLICATIONS:  None.   INTRAVENOUS ACCESS:  Via right femoral vein, 8 French sheath.   INDICATIONS:  This is a very pleasant 76 year old white male with a history  of hypertension and hyperlipidemia as well as peripheral vascular disease  with known 70% left carotid ICA stenosis followed by Dr. Edilia Bo who  presented with complaints of substernal chest pain.  Stress Cardiolite  showed reversible perfusion defect in the inferior wall with normal LV  function.  The patient now presents for cardiac catheterization.   DESCRIPTION OF PROCEDURE:  The patient was brought to the cardiac  catheterization laboratory in the fasting nonsedated state.  Informed  consent was obtained.  The patient was connected to continuous heart rate  and pulse oximetry monitoring, intermittent blood pressure monitoring.  The  right groin was prepped and draped in a sterile fashion.  Xylocaine 1% was  used for local anesthesia.  Using the modified Seldinger technique, a 6  French sheath was placed in the right femoral artery.  Under fluoroscopic a  7.5 [place] JL4 catheter was placed in the left coronary artery.  Multiple  cine films were taken in the 30 degree RAO and 40 degree LAO views.  The  catheter was then exchanged out over a guidewire for a 6 [place] JR4 catheter  which was placed under fluoroscopic  guidance to the right coronary artery.  Multiple cine films were taken in 30 degree RAO and 40 degree LAO views.  There was a proximal lesion in the RCA and 200 mcg of intracoronary  nitroglycerin was given.  Repeat coronary angiography was performed in the  30 degree RAO and 40 degree LAO views of the RCA.  The catheter was then  exchanged out over a guidewire for a 6 French angled pigtail catheter which  placed under fluoroscopic guidance in the left ventricular cavity.  The left  ventriculography was performed in a 30 degree RAO view using a total of 30  mL of contrast at 15 mL per second.  The catheter was then pulled back  across the aortic valve with no significant gradient.  At the end of the  procedure, the patient went on to PCI for Dr. Amil Amen of the RCA.   RESULTS:  1. The left main coronary artery is widely patent and bifurcates into the  left anterior descending artery and left circumflex artery.  2. The left anterior descending artery is widely patent throughout its     course until the mid distal portion where there is a 40% narrowing with     myocardial bridging.  There is a diagonal that arises from the proximal     LAD which is a very diagonal and it bifurcates into two daughter vessels     both of which are widely patent.  3. The left circumflex is widely patent throughout its course and transverse     the AV groove.  It gives rise to three obtuse marginal branches all of     which are widely patent.  4. The right coronary artery has a 95% proximal narrowing and then gives     rise to an acute marginal branch which is widely patent.  The mid RCA has     a 30% narrowing just after the takeoff of the acute marginal and then     bifurcates distally until the posterior descending and posterior lateral     RA both of which are widely patent.  5. Left ventriculography shows normal left ventricular systolic function.     The aortic pressure is 106/64 mmHg.  LV pressure is  106/11 mmHg, LVEDP 15     mmHg.   ASSESSMENT:  1. One vessel obstructive coronary disease.  2. Normal left ventricular function.  3. Hypertension.  4. Hyperlipidemia on Lipitor.   PLAN:  1. PCI of RCA per Dr. Amil Amen.  2. Aspirin and Plavix.  3. Continue other medications.                                               Armanda Magic, M.D.    TT/MEDQ  D:  03/11/2004  T:  03/11/2004  Job:  [id number]   cc:   Thelma Barge P. Modesto Charon, M.D.  [address]  [place]  [place] [address]  Fax: [phone]

## 2011-03-14 NOTE — Assessment & Plan Note (Signed)
[facility]                        [name] NOTE   NAME:[name], [name]                          MRN:          [id number]  DATE:10/06/2006                            DOB:          04-29-1935    REASON FOR VISIT:  Diarrhea for two months.   Mr. Simonis is a 76 year old male here to establish as a new patient.  He  reports that over the last two months he has been having diarrhea.  Frequency of bowel movements up to 10 times per day.  He reports that  two months ago his Coreg was switched over to a generic and he felt that  led to the change in his bowel movement.  One month ago, he was switched  back to the original Coreg but his symptoms continued.  He denies any  constipation, nausea, vomiting, weight loss, fevers or chills.  He  denies any change in diet or anorexia.   PAST MEDICAL HISTORY:  1. Hypertension.  2. Hyperlipidemia.  3. Coronary artery disease, status post stent of the RCA.  4. Erectile dysfunction.  5. Seborrheic keratosis.  6. History of Lyme disease in 2006.   PAST SURGICAL HISTORY:  1. Appendectomy in 1955.  2. Cardiac catheterization status post stent of the RCA in 2005.   MEDICATIONS:  1. Zetia 10 mg daily.  2. Hydrochlorothiazide 25 mg.  3. Lipitor 40 mg.  4. Coreg 6.25 mg b.i.d.  5. Keflex and hydrocodone p.r.n. that were previously prescribed by      his dentist.   ALLERGIES:  No known drug allergies.   FAMILY HISTORY:  Father passed away secondary to a stroke.  Also had a  history of colon cancer.  Mother passed away secondary to a stroke.  He  has one brother who was alive and well with a history of hyperlipidemia.  The patient is retired, married with three children who are alive and  well.  He quit cigars many years ago.  He drinks a glass of wine daily.   REVIEW OF SYSTEMS:  As per HPI, otherwise unremarkable.   OBJECTIVE:  Weight 150, temperature 97.8, pulse 72, blood pressure  130/80.  GENERAL:  We have a pleasant male in no acute distress, questions  appropriately.  Oropharynx was benign.  Neck was supple.  No  lymphadenopathy, carotid bruits or JVD.  LUNGS:  Clear.  HEART:  Regular rate and rhythm with normal S1-S2.  No murmurs, gallops,  or rubs heard on my examination today.  ABDOMEN:  Soft and nondistended.  No palpable.  No hepatosplenomegaly.  No rebound or guarding.   IMPRESSION:  A 76 year old male presenting with a two-month history of  diarrhea.  Etiology unclear.  He denied any recent travels or exposures  to any individuals with similar symptoms.   PLAN:  1. We will obtain a CBC, comprehensive metabolic profile and stool      cultures.  2. Further recommendations after review of the above data.  Most      likely will refer the patient to gastroenterology.  I reviewed some  of his medical records and noted that his last colon cancer      screening was done in 2001 with a flexible sigmoidoscopy.  3. The patient is to follow up with me in four to six weeks to review      any other health maintenance issues and other concerns.  The      patient agreed with plan and will follow up accordingly.     [name], M.D.  Electronically Signed    LA/MedQ  DD: 10/07/2006  DT: 10/08/2006  Job #: [id number]

## 2011-08-13 ENCOUNTER — Other Ambulatory Visit (INDEPENDENT_AMBULATORY_CARE_PROVIDER_SITE_OTHER): Payer: Medicare Other | Admitting: *Deleted

## 2011-08-13 DIAGNOSIS — I6529 Occlusion and stenosis of unspecified carotid artery: Secondary | ICD-10-CM

## 2011-08-25 ENCOUNTER — Ambulatory Visit (INDEPENDENT_AMBULATORY_CARE_PROVIDER_SITE_OTHER): Payer: Medicare Other | Admitting: Family Medicine

## 2011-08-25 ENCOUNTER — Encounter: Payer: Self-pay | Admitting: Family Medicine

## 2011-08-25 VITALS — BP 132/84 | HR 64 | Temp 98.1°F | Ht 66.0 in | Wt 159.6 lb

## 2011-08-25 DIAGNOSIS — Z23 Encounter for immunization: Secondary | ICD-10-CM

## 2011-08-25 DIAGNOSIS — R21 Rash and other nonspecific skin eruption: Secondary | ICD-10-CM

## 2011-08-25 MED ORDER — NYSTATIN-TRIAMCINOLONE 100000-0.1 UNIT/GM-% EX OINT: TOPICAL_OINTMENT | Freq: Two times a day (BID) | CUTANEOUS | Status: AC

## 2011-08-25 NOTE — Patient Instructions (Signed)
This appears to be a fungal rash vs something you came in contact with Use the Mycolog ointment- this will treat both- twice daily If no improvement in the next 2 weeks- call me and we'll set you up w/ dermatology Call with any questions or concerns Hang in there!!!

## 2011-08-25 NOTE — Assessment & Plan Note (Signed)
Area is most consistent w/ contact dermatitis vs fungal infxn.  Start Mycolog ointment to tx both.  Reviewed supportive care and red flags that should prompt return.  Pt expressed understanding and is in agreement w/ plan.

## 2011-08-25 NOTE — Progress Notes (Signed)
  Subjective:    Patient ID: [name], male    DOB: August 10, 1935, 76 y.o.   MRN: [id number]  HPI Rash- went to [facility] 10 days ago, noticed the next morning.  Called on Thursday, was told nothing was available.  Showed up on Friday asking to be worked in- was told no.  Located on abdomen.  Feels rash has spread.  Denies itching.  No pain.  Has area on R upper thigh.  Was initially itchy.   Review of Systems For ROS see HPI     Objective:   Physical Exam  Vitals reviewed. Constitutional: He appears well-developed and well-nourished.  Skin: Skin is warm and dry. Rash (very erythematous macular/papular rash on R abdomen, nearly confluent.  similar area on R thigh) noted. There is erythema.          Assessment & Plan:

## 2011-08-27 ENCOUNTER — Encounter: Payer: Self-pay | Admitting: Vascular Surgery

## 2011-08-28 ENCOUNTER — Other Ambulatory Visit: Payer: Self-pay | Admitting: Vascular Surgery

## 2011-08-28 DIAGNOSIS — I6529 Occlusion and stenosis of unspecified carotid artery: Secondary | ICD-10-CM

## 2011-09-01 ENCOUNTER — Ambulatory Visit (INDEPENDENT_AMBULATORY_CARE_PROVIDER_SITE_OTHER): Payer: Medicare Other | Admitting: Family Medicine

## 2011-09-01 ENCOUNTER — Encounter: Payer: Self-pay | Admitting: Family Medicine

## 2011-09-01 DIAGNOSIS — R05 Cough: Secondary | ICD-10-CM

## 2011-09-01 DIAGNOSIS — R059 Cough, unspecified: Secondary | ICD-10-CM

## 2011-09-01 DIAGNOSIS — R21 Rash and other nonspecific skin eruption: Secondary | ICD-10-CM

## 2011-09-01 MED ORDER — AZITHROMYCIN 250 MG PO TABS: 250.0000 mg | ORAL_TABLET | Freq: Every day | ORAL | Status: DC

## 2011-09-01 MED ORDER — BENZONATATE 200 MG PO CAPS: 200.0000 mg | ORAL_CAPSULE | Freq: Three times a day (TID) | ORAL | Status: DC | PRN

## 2011-09-01 MED ORDER — GUAIFENESIN-CODEINE 100-10 MG/5ML PO SYRP: 10.0000 mL | ORAL_SOLUTION | Freq: Two times a day (BID) | ORAL | Status: DC | PRN

## 2011-09-01 NOTE — Patient Instructions (Signed)
If the rash does not improve in the next 10-14 days- call and we'll send you to derm (i think it looks MUCH better) Start the Azithromycin for probable bronchitis Use the cough pills for daytime, the syrup for night Add Mucinex to thin your congestion Drink plenty of fluids REST! Hang in there!!!

## 2011-09-01 NOTE — Assessment & Plan Note (Signed)
Most likely bronchitis.  Start Azithro.  Reviewed supportive care and red flags that should prompt return.  Pt expressed understanding and is in agreement w/ plan.

## 2011-09-01 NOTE — Assessment & Plan Note (Signed)
Despite pt's concern that area is spreading it appears to be improving and drying up.  Advised him to continue topical medicine and if no improvement will refer to derm.  Pt expressed understanding and is in agreement w/ plan.

## 2011-09-01 NOTE — Progress Notes (Signed)
  Subjective:    Patient ID: [name], male    DOB: 02-Nov-1934, 76 y.o.   MRN: [id number]  HPI Rash- pt feels area has increased in size, is using cream as directed.  URI- 2-3 days of cough, last night cough kept him awake.  Denies sore throat, + chest congestion.  Cough is worse w/ lying down.  No fevers.  No nasal congestion, no ear pain, facial pain.  + sick contacts.   Review of Systems For ROS see HPI     Objective:   Physical Exam  Vitals reviewed. Constitutional: He appears well-developed and well-nourished. No distress.  HENT:  Head: Normocephalic and atraumatic.  Right Ear: Tympanic membrane normal.  Left Ear: Tympanic membrane normal.  Nose: No mucosal edema or rhinorrhea. Right sinus exhibits no maxillary sinus tenderness and no frontal sinus tenderness. Left sinus exhibits no maxillary sinus tenderness and no frontal sinus tenderness.  Mouth/Throat: Mucous membranes are normal. No oropharyngeal exudate, posterior oropharyngeal edema or posterior oropharyngeal erythema.  Eyes: Conjunctivae and EOM are normal. Pupils are equal, round, and reactive to light.  Neck: Normal range of motion. Neck supple.  Cardiovascular: Normal rate, regular rhythm and normal heart sounds.   Pulmonary/Chest: Effort normal and breath sounds normal. No respiratory distress. He has no wheezes.       + hacking cough  Lymphadenopathy:    He has no cervical adenopathy.  Skin: Skin is warm and dry. Rash (large area on abdomen is much less red, starting to dry up- no evidence of spread) noted.          Assessment & Plan:

## 2011-11-10 ENCOUNTER — Ambulatory Visit (INDEPENDENT_AMBULATORY_CARE_PROVIDER_SITE_OTHER): Payer: Medicare Other | Admitting: Family Medicine

## 2011-11-10 ENCOUNTER — Encounter: Payer: Self-pay | Admitting: Family Medicine

## 2011-11-10 VITALS — BP 115/75 | HR 74 | Temp 98.8°F | Ht 66.0 in | Wt 158.8 lb

## 2011-11-10 DIAGNOSIS — J069 Acute upper respiratory infection, unspecified: Secondary | ICD-10-CM

## 2011-11-10 MED ORDER — BENZONATATE 200 MG PO CAPS: 200.0000 mg | ORAL_CAPSULE | Freq: Three times a day (TID) | ORAL | Status: AC | PRN

## 2011-11-10 MED ORDER — GUAIFENESIN-CODEINE 100-10 MG/5ML PO SYRP: 10.0000 mL | ORAL_SOLUTION | Freq: Two times a day (BID) | ORAL | Status: DC | PRN

## 2011-11-10 MED ORDER — AZITHROMYCIN 250 MG PO TABS: 250.0000 mg | ORAL_TABLET | Freq: Every day | ORAL | Status: AC

## 2011-11-10 NOTE — Progress Notes (Signed)
  Subjective:    Patient ID: [name], male    DOB: 09-12-1935, 77 y.o.   MRN: [id number]  HPI Cough- sxs started Friday.  Worse when lying cough.  Cough is dry.  No fevers.  Mild nasal congestion.  No facial pressure, ear pain.  + sick contacts.  Taking mucinex and left over cough med (cheratussin) w/ some relief.   Review of Systems For ROS see HPI     Objective:   Physical Exam  Constitutional: He appears well-developed and well-nourished. No distress.  HENT:  Head: Normocephalic and atraumatic.  Right Ear: Tympanic membrane normal.  Left Ear: Tympanic membrane normal.  Nose: No mucosal edema or rhinorrhea. Right sinus exhibits no maxillary sinus tenderness and no frontal sinus tenderness. Left sinus exhibits no maxillary sinus tenderness and no frontal sinus tenderness.  Mouth/Throat: Mucous membranes are normal. No oropharyngeal exudate, posterior oropharyngeal edema or posterior oropharyngeal erythema.  Eyes: Conjunctivae and EOM are normal. Pupils are equal, round, and reactive to light.  Neck: Normal range of motion. Neck supple.  Cardiovascular: Normal rate, regular rhythm and normal heart sounds.   Pulmonary/Chest: Effort normal. No respiratory distress. He has wheezes (faint end expiratory wheezes).       No cough heard  Lymphadenopathy:    He has no cervical adenopathy.  Skin: Skin is warm and dry.          Assessment & Plan:

## 2011-11-10 NOTE — Patient Instructions (Signed)
This is most likely a bronchitis Start the Azithromycin as directed Continue the mucinex to thin your chest and nasal congestion Drink plenty of fluids Cough meds as needed- syrup will make you drowsy, pills will not REST! Hang in there!!!

## 2011-11-10 NOTE — Assessment & Plan Note (Signed)
Pt's wheezes and cough consistent w/ bronchitis.  Start abx.  Reviewed supportive care and red flags that should prompt return.  Pt expressed understanding and is in agreement w/ plan.

## 2012-05-19 ENCOUNTER — Telehealth: Payer: Self-pay | Admitting: Family Medicine

## 2012-05-19 MED ORDER — ATORVASTATIN CALCIUM 40 MG PO TABS: 40.0000 mg | ORAL_TABLET | Freq: Every day | ORAL | Status: DC

## 2012-05-19 NOTE — Telephone Encounter (Signed)
Refill: Atorvastatin tab 40 mg. Take 1 tablet at bedtime. 90 day supply °

## 2012-05-19 NOTE — Telephone Encounter (Signed)
rx sent to pharmacy by e-script for #90 per verbal from MD Tabori Called pt to schedule CPE per no recent CPE noted, pt accepted apt for CPE on 06-23-12 at 8:30am, pt aware to be fasting, MD Beverely Low made aware that apt has been scheduled

## 2012-05-24 NOTE — Telephone Encounter (Signed)
Pt., walked in stated he spoke to [facility] this morning and they told him they have not heard from [place] and have attempted to contact us twice last week as well as a fax sent on Friday 7.26.13 Pt has only 2-pills left and would like medication called to # [phone] per order# [id number] Patient cb# [phone], cell [phone]

## 2012-05-24 NOTE — Telephone Encounter (Signed)
Noted RX sent via escribe for #90 on 05-19-12, incoming fax from [facility] noted on 05-21-12  form filled out and faxed, have re-faxed form again today to advise rush and 2nd time sent.

## 2012-06-23 ENCOUNTER — Encounter: Payer: Self-pay | Admitting: Family Medicine

## 2012-06-23 ENCOUNTER — Ambulatory Visit (INDEPENDENT_AMBULATORY_CARE_PROVIDER_SITE_OTHER): Payer: Medicare Other | Admitting: Family Medicine

## 2012-06-23 ENCOUNTER — Encounter: Payer: Self-pay | Admitting: *Deleted

## 2012-06-23 VITALS — BP 108/75 | HR 71 | Temp 98.3°F | Ht 65.0 in | Wt 154.8 lb

## 2012-06-23 DIAGNOSIS — Z0000 Encounter for general adult medical examination without abnormal findings: Secondary | ICD-10-CM | POA: Insufficient documentation

## 2012-06-23 DIAGNOSIS — E785 Hyperlipidemia, unspecified: Secondary | ICD-10-CM

## 2012-06-23 DIAGNOSIS — I10 Essential (primary) hypertension: Secondary | ICD-10-CM

## 2012-06-23 DIAGNOSIS — Z125 Encounter for screening for malignant neoplasm of prostate: Secondary | ICD-10-CM

## 2012-06-23 LAB — CBC WITH DIFFERENTIAL/PLATELET
Eosinophils Relative: 4 % (ref 0.0–5.0)
HCT: 44.7 % (ref 39.0–52.0)
Hemoglobin: 14.9 g/dL (ref 13.0–17.0)
Lymphs Abs: 1.5 10*3/uL (ref 0.7–4.0)
MCV: 91.5 fl (ref 78.0–100.0)
Monocytes Absolute: 0.6 10*3/uL (ref 0.1–1.0)
Monocytes Relative: 10 % (ref 3.0–12.0)
Neutro Abs: 3.3 10*3/uL (ref 1.4–7.7)
Platelets: 174 10*3/uL (ref 150.0–400.0)
RDW: 13.7 % (ref 11.5–14.6)

## 2012-06-23 LAB — BASIC METABOLIC PANEL
BUN: 17 mg/dL (ref 6–23)
CO2: 27 mEq/L (ref 19–32)
Chloride: 103 mEq/L (ref 96–112)
Creatinine, Ser: 1.1 mg/dL (ref 0.4–1.5)
Glucose, Bld: 90 mg/dL (ref 70–99)

## 2012-06-23 LAB — PSA: PSA: 2.76 ng/mL (ref 0.10–4.00)

## 2012-06-23 LAB — HEPATIC FUNCTION PANEL
ALT: 33 U/L (ref 0–53)
Albumin: 4 g/dL (ref 3.5–5.2)
Total Bilirubin: 1 mg/dL (ref 0.3–1.2)
Total Protein: 6.7 g/dL (ref 6.0–8.3)

## 2012-06-23 LAB — TSH: TSH: 2.67 u[IU]/mL (ref 0.35–5.50)

## 2012-06-23 LAB — LIPID PANEL: Cholesterol: 105 mg/dL (ref 0–200)

## 2012-06-23 NOTE — Progress Notes (Signed)
  Subjective:    Patient ID: [name], male    DOB: 11-06-1934, 77 y.o.   MRN: [id number]  HPI Here today for CPE.  Risk Factors: Hyperlipidemia- chronic problem, on lipitor and zetia daily.  Denies abd pain, N/V, myalgias HTN- chronic problem, excellent BP control on HCTZ, Coreg.  No CP, SOB, HAs, visual changes, edema. CAD- following w/ Dr Mayford Knife yearly. Physical Activity: will exercise intermittently Fall Risk: very low, steady on feet Depression: no sxs Hearing: normal to conversational tones and whispered voice at 6 ft ADL's: independent Cognitive: normal linear thought process, memory and attention intact Home Safety: safe at home, lives w/ wife Height, Weight, BMI, Visual Acuity: see vitals, vision corrected to 20/20 s/p detached retina surgery Counseling: UTD on colonoscopy. Labs Ordered: See A&P Care Plan: See A&P    Review of Systems Patient reports no vision/hearing changes, anorexia, fever ,adenopathy, persistant/recurrent hoarseness, swallowing issues, chest pain, palpitations, edema, persistant/recurrent cough, hemoptysis, dyspnea (rest,exertional, paroxysmal nocturnal), gastrointestinal  bleeding (melena, rectal bleeding), abdominal pain, excessive heart burn, GU symptoms (dysuria, hematuria, voiding/incontinence issues) syncope, focal weakness, memory loss, numbness & tingling, skin/hair/nail changes, depression, anxiety, abnormal bruising/bleeding, musculoskeletal symptoms/signs.     Objective:   Physical Exam BP 108/75  Pulse 71  Temp 98.3 F (36.8 C) (Oral)  Ht 5\' 5"  (1.651 m)  Wt 154 lb 12.8 oz (70.217 kg)  BMI 25.76 kg/m2  SpO2 95%  General Appearance:    Alert, cooperative, no distress, appears stated age  Head:    Normocephalic, without obvious abnormality, atraumatic  Eyes:    PERRL, conjunctiva/corneas clear, EOM's intact, fundi    benign, both eyes       Ears:    Normal TM's and external ear canals, both ears  Nose:   Nares normal, septum midline,  mucosa normal, no drainage   or sinus tenderness  Throat:   Lips, mucosa, and tongue normal; teeth and gums normal  Neck:   Supple, symmetrical, trachea midline, no adenopathy;       thyroid:  No enlargement/tenderness/nodules  Back:     Symmetric, no curvature, ROM normal, no CVA tenderness  Lungs:     Clear to auscultation bilaterally, respirations unlabored  Chest wall:    No tenderness or deformity  Heart:    Regular rate and rhythm, S1 and S2 normal, no murmur, rub   or gallop  Abdomen:     Soft, non-tender, bowel sounds active all four quadrants,    no masses, no organomegaly  Genitalia:    Normal male without lesion, discharge or tenderness  Rectal:    Normal tone, normal prostate, no masses or tenderness  Extremities:   Extremities normal, atraumatic, no cyanosis or edema  Pulses:   2+ and symmetric all extremities  Skin:   Skin color, texture, turgor normal, no rashes or lesions  Lymph nodes:   Cervical, supraclavicular, and axillary nodes normal  Neurologic:   CNII-XII intact. Normal strength, sensation and reflexes      throughout          Assessment & Plan:

## 2012-06-23 NOTE — Patient Instructions (Addendum)
Follow up in 6 months to recheck cholesterol and BP We'll notify you of your lab results and make any changes if needed You look great!  Keep up the good work! Happy [birth year] Birthday!!!

## 2012-07-02 NOTE — Assessment & Plan Note (Signed)
Chronic problem.  Adequate control.  Asymptomatic.  Check labs.  No anticipated changes. 

## 2012-07-02 NOTE — Assessment & Plan Note (Signed)
Pt's PE WNL.  UTD on health maintenance.  Check labs.  Anticipatory guidance provided.  

## 2012-07-02 NOTE — Assessment & Plan Note (Signed)
Chronic problem.  Tolerating statin w/out difficulty.  Check labs.  Adjust meds prn  

## 2012-08-09 ENCOUNTER — Ambulatory Visit (INDEPENDENT_AMBULATORY_CARE_PROVIDER_SITE_OTHER): Payer: Medicare Other | Admitting: *Deleted

## 2012-08-09 DIAGNOSIS — Z23 Encounter for immunization: Secondary | ICD-10-CM

## 2012-08-24 ENCOUNTER — Other Ambulatory Visit: Payer: Self-pay | Admitting: Family Medicine

## 2012-08-24 MED ORDER — ATORVASTATIN CALCIUM 40 MG PO TABS: 40.0000 mg | ORAL_TABLET | Freq: Every day | ORAL | Status: DC

## 2012-08-24 NOTE — Telephone Encounter (Signed)
refill Atorvastatin (Tab) 40 MG Take 1 tablet (40 mg total) by mouth daily.  #90 last wrt 7.24.13 #90--last ov V70-06/23/12

## 2012-08-24 NOTE — Telephone Encounter (Signed)
rx sent to pharmacy by e-script Per recent CPE

## 2012-08-25 ENCOUNTER — Ambulatory Visit: Payer: Medicare Other

## 2012-08-25 ENCOUNTER — Other Ambulatory Visit: Payer: Medicare Other

## 2012-09-01 ENCOUNTER — Ambulatory Visit: Payer: Medicare Other | Admitting: Neurosurgery

## 2012-09-01 ENCOUNTER — Other Ambulatory Visit: Payer: Medicare Other

## 2012-09-01 ENCOUNTER — Encounter: Payer: Self-pay | Admitting: Neurosurgery

## 2012-09-02 ENCOUNTER — Encounter: Payer: Self-pay | Admitting: Neurosurgery

## 2012-09-02 ENCOUNTER — Ambulatory Visit (INDEPENDENT_AMBULATORY_CARE_PROVIDER_SITE_OTHER): Payer: Medicare Other | Admitting: Neurosurgery

## 2012-09-02 ENCOUNTER — Other Ambulatory Visit (INDEPENDENT_AMBULATORY_CARE_PROVIDER_SITE_OTHER): Payer: Medicare Other | Admitting: *Deleted

## 2012-09-02 VITALS — BP 129/72 | HR 59 | Resp 16 | Ht 66.0 in | Wt 156.0 lb

## 2012-09-02 DIAGNOSIS — I6529 Occlusion and stenosis of unspecified carotid artery: Secondary | ICD-10-CM

## 2012-09-02 NOTE — Progress Notes (Signed)
[facility] Carotid Office Note  CC: Carotid surveillance Referring Physician: Edilia Bo  History of Present Illness: 77 year old male patient of Dr. Adele Dan with no history of carotid intervention. The patient denies any signs or symptoms of CVA, TIA, amaurosis fugax or neural deficit. The patient denies any new medical diagnoses or recent surgery.  Past Medical History  Diagnosis Date  . Hyperlipidemia   . Hypertension   . CAD (coronary artery disease)   . Peripheral vascular disease   . Vertigo   . Cognitive impairment, mild, so stated   . Colitis   . Vertebrobasilar artery syndrome     ROS: [x]  Positive   [ ]  Denies    General: [ ]  Weight loss, [ ]  Fever, [ ]  chills Neurologic: [ ]  Dizziness, [ ]  Blackouts, [ ]  Seizure [ ]  Stroke, [ ]  "Mini stroke", [ ]  Slurred speech, [ ]  Temporary blindness; [ ]  weakness in arms or legs, [ ]  Hoarseness Cardiac: [ ]  Chest pain/pressure, [ ]  Shortness of breath at rest [ ]  Shortness of breath with exertion, [ ]  Atrial fibrillation or irregular heartbeat Vascular: [ ]  Pain in legs with walking, [ ]  Pain in legs at rest, [ ]  Pain in legs at night,  [ ]  Non-healing ulcer, [ ]  Blood clot in vein/DVT,   Pulmonary: [ ]  Home oxygen, [ ]  Productive cough, [ ]  Coughing up blood, [ ]  Asthma,  [ ]  Wheezing Musculoskeletal:  [ ]  Arthritis, [ ]  Low back pain, [ ]  Joint pain Hematologic: [ ]  Easy Bruising, [ ]  Anemia; [ ]  Hepatitis Gastrointestinal: [ ]  Blood in stool, [ ]  Gastroesophageal Reflux/heartburn, [ ]  Trouble swallowing Urinary: [ ]  chronic Kidney disease, [ ]  on HD - [ ]  MWF or [ ]  TTHS, [ ]  Burning with urination, [ ]  Difficulty urinating Skin: [ ]  Rashes, [ ]  Wounds Psychological: [ ]  Anxiety, [ ]  Depression   Social History History  Substance Use Topics  . Smoking status: Never Smoker   . Smokeless tobacco: Not on file  . Alcohol Use: Yes     Comment: wine nightly    Family History Family History    Problem Relation Age of Onset  . Stroke Mother   . Hyperlipidemia Mother   . Stroke Father   . Hyperlipidemia Father   . Hypertension Father     No Known Allergies  Current Outpatient Prescriptions  Medication Sig Dispense Refill  . atorvastatin (LIPITOR) 40 MG tablet Take 1 tablet (40 mg total) by mouth daily.  90 tablet  1  . carvedilol (COREG) 6.25 MG tablet Take 6.25 mg by mouth 2 (two) times daily.        . cephALEXin (KEFLEX) 500 MG capsule       . clobetasol cream (TEMOVATE) 0.05 %       . donepezil (ARICEPT) 10 MG tablet Take 10 mg by mouth daily.        Marland Kitchen ezetimibe (ZETIA) 10 MG tablet Take 10 mg by mouth daily.        . hydrochlorothiazide 25 MG tablet Take 25 mg by mouth daily.        . mometasone (NASONEX) 50 MCG/ACT nasal spray 2 sprays by Nasal route daily.        Marland Kitchen triamcinolone cream (KENALOG) 0.1 %         Physical Examination  Filed Vitals:   09/02/12 0924  BP: 129/72  Pulse: 59  Resp:     Body  mass index is 25.18 kg/(m^2).  General:  WDWN in NAD Gait: Normal HEENT: WNL Eyes: Pupils equal Pulmonary: normal non-labored breathing , without Rales, rhonchi,  wheezing Cardiac: RRR, without  Murmurs, rubs or gallops; Abdomen: soft, NT, no masses Skin: no rashes, ulcers noted  Vascular Exam Pulses: 3+ radial pulses bilaterally Carotid bruits: Carotid pulses to auscultation no bruits are heard Extremities without ischemic changes, no Gangrene , no cellulitis; no open wounds;  Musculoskeletal: no muscle wasting or atrophy   Neurologic: A&O X 3; Appropriate Affect ; SENSATION: normal; MOTOR FUNCTION:  moving all extremities equally. Speech is fluent/normal  Non-Invasive Vascular Imaging CAROTID DUPLEX 09/02/2012  Right ICA 0 - 19% stenosis Left ICA 20 - 39 % stenosis   ASSESSMENT/PLAN: Minimal bilateral carotid stenosis, asymptomatic, the patient will followup in one year with repeat carotid duplex. The patient's questions were encouraged and  answered, he is in agreement with this plan.  Lauree Chandler ANP      Clinic MD: Darrick Penna*

## 2012-09-02 NOTE — Addendum Note (Signed)
Addended by: Sharee Pimple on: 09/02/2012 04:15 PM   Modules accepted: Orders

## 2013-01-12 ENCOUNTER — Telehealth: Payer: Self-pay | Admitting: Family Medicine

## 2013-01-12 MED ORDER — ATORVASTATIN CALCIUM 40 MG PO TABS: ORAL_TABLET | ORAL | Status: DC

## 2013-01-12 NOTE — Telephone Encounter (Signed)
Refill: Atorvastatin tab 40 mg. Take 1 by mouth daily. 90 day supply

## 2013-01-27 ENCOUNTER — Ambulatory Visit (INDEPENDENT_AMBULATORY_CARE_PROVIDER_SITE_OTHER): Payer: Medicare Other | Admitting: Family Medicine

## 2013-01-27 ENCOUNTER — Encounter: Payer: Self-pay | Admitting: Family Medicine

## 2013-01-27 VITALS — BP 142/90 | HR 67 | Temp 98.1°F | Ht 66.0 in | Wt 155.8 lb

## 2013-01-27 DIAGNOSIS — M67919 Unspecified disorder of synovium and tendon, unspecified shoulder: Secondary | ICD-10-CM

## 2013-01-27 DIAGNOSIS — S20211A Contusion of right front wall of thorax, initial encounter: Secondary | ICD-10-CM

## 2013-01-27 DIAGNOSIS — M75102 Unspecified rotator cuff tear or rupture of left shoulder, not specified as traumatic: Secondary | ICD-10-CM | POA: Insufficient documentation

## 2013-01-27 DIAGNOSIS — S20219A Contusion of unspecified front wall of thorax, initial encounter: Secondary | ICD-10-CM

## 2013-01-27 MED ORDER — NAPROXEN 500 MG PO TABS: 500.0000 mg | ORAL_TABLET | Freq: Two times a day (BID) | ORAL | Status: AC

## 2013-01-27 MED ORDER — HYDROCODONE-ACETAMINOPHEN 5-325 MG PO TABS: 1.0000 | ORAL_TABLET | Freq: Four times a day (QID) | ORAL | Status: DC | PRN

## 2013-01-27 NOTE — Patient Instructions (Addendum)
We'll call you with your ortho appt Heating pad on the ribs ICE on the shoulder Naproxen twice daily w/ food for 7-10 days (anti-inflammatory) [place] as needed for pain Call with any questions or concerns Hang in there!

## 2013-01-27 NOTE — Assessment & Plan Note (Signed)
New.  Pt declines xray to assess for fx.  Heat prn.  vicodin prn.  Reviewed supportive care and red flags that should prompt return.  Pt expressed understanding and is in agreement w/ plan.

## 2013-01-27 NOTE — Assessment & Plan Note (Signed)
New.  Pt w/ clear rotator cuff sxs and + impingement signs.  Start scheduled NSAIDs.  Refer to ortho for complete evaluation.  Pt expressed understanding and is in agreement w/ plan.

## 2013-01-27 NOTE — Progress Notes (Signed)
  Subjective:    Patient ID: [name], male    DOB: [birth year], 78 y.o.   MRN: [id number]  HPI Rib pain- fell backwards off an elevated platform and hit a bench on R side.  Has injured ribs before.  Pain is 'horrific'.  Able to breathe comfortably.  Not interested in xray to assess for fx- 'there's nothing you can do for it anyway'  L shoulder pain- has been using Blu Emu w/out relief.  Developed pain 2 months ago.  Pain is worse w/ overhead motion, abduction, forward flexion.  Pain over deltoid.  Difficult to fall asleep but pain is not waking pt.  No weakness or numbness of arm or hand.  R hand dominant.  No known injury.   Review of Systems For ROS see HPI     Objective:   Physical Exam  Vitals reviewed. Constitutional: He is oriented to person, place, and time. He appears well-developed and well-nourished. No distress.  Cardiovascular: Intact distal pulses.   Pulmonary/Chest: Effort normal and breath sounds normal. No respiratory distress. He has no wheezes. He has no rales. He exhibits tenderness (over R ribs in mid-axillary line.  + hematoma present.  no bony abnormality or step-off noted).  Musculoskeletal:  L shoulder- normal visual inspection, no TTP over clavicle or AC joint.  + impingement signs.  Pain w/ forward flexion, abduction, internal rotation.  Neurological: He is alert and oriented to person, place, and time. He has normal reflexes. Coordination normal.          Assessment & Plan:

## 2013-05-31 ENCOUNTER — Other Ambulatory Visit: Payer: Self-pay | Admitting: *Deleted

## 2013-06-01 ENCOUNTER — Other Ambulatory Visit (INDEPENDENT_AMBULATORY_CARE_PROVIDER_SITE_OTHER): Payer: Medicare Other

## 2013-06-01 DIAGNOSIS — E785 Hyperlipidemia, unspecified: Secondary | ICD-10-CM

## 2013-06-01 DIAGNOSIS — I10 Essential (primary) hypertension: Secondary | ICD-10-CM

## 2013-06-01 LAB — HEPATIC FUNCTION PANEL
Bilirubin, Direct: 0 mg/dL (ref 0.0–0.3)
Total Bilirubin: 0.7 mg/dL (ref 0.3–1.2)
Total Protein: 6.8 g/dL (ref 6.0–8.3)

## 2013-06-01 LAB — LIPID PANEL
HDL: 41 mg/dL (ref >39.00)
LDL Cholesterol: 48 mg/dL (ref 0–99)
VLDL: 20.8 mg/dL (ref 0.0–40.0)

## 2013-06-01 LAB — BASIC METABOLIC PANEL
BUN: 16 mg/dL (ref 6–23)
Chloride: 101 mEq/L (ref 96–112)
Glucose, Bld: 82 mg/dL (ref 70–99)
Potassium: 3.5 mEq/L (ref 3.5–5.1)
Sodium: 139 mEq/L (ref 135–145)

## 2013-06-01 NOTE — Telephone Encounter (Signed)
Rx Refilled for # 90 05/31/2013.  And the patient has been scheduled to come in for labs 06/01/13.

## 2013-06-03 ENCOUNTER — Other Ambulatory Visit: Payer: Self-pay | Admitting: *Deleted

## 2013-06-03 MED ORDER — ATORVASTATIN CALCIUM 40 MG PO TABS: ORAL_TABLET | ORAL | Status: DC

## 2013-06-08 NOTE — Progress Notes (Signed)
Left message on voice mail that labs look great, no changes to current regime.

## 2013-06-13 ENCOUNTER — Encounter: Payer: Self-pay | Admitting: Lab

## 2013-06-14 ENCOUNTER — Ambulatory Visit (INDEPENDENT_AMBULATORY_CARE_PROVIDER_SITE_OTHER): Payer: Medicare Other | Admitting: Internal Medicine

## 2013-06-14 VITALS — BP 170/80 | HR 67 | Temp 98.4°F | Wt 154.8 lb

## 2013-06-14 DIAGNOSIS — M25561 Pain in right knee: Secondary | ICD-10-CM

## 2013-06-14 DIAGNOSIS — M25569 Pain in unspecified knee: Secondary | ICD-10-CM

## 2013-06-14 NOTE — Progress Notes (Signed)
  Subjective:    Patient ID: [name], male    DOB: [birth year], 78 y.o.   MRN: [id number]  HPI Acute visit 3 weeks ago there was some swelling and a bruise at the  proximal popliteal area on the right.  This swelling is now gone but he has pain with knee flexion.  Past Medical History  Diagnosis Date  . Hyperlipidemia   . Hypertension   . CAD (coronary artery disease)   . Peripheral vascular disease   . Vertigo   . Cognitive impairment, mild, so stated   . Colitis   . Vertebrobasilar artery syndrome    Past Surgical History  Procedure Laterality Date  . Appendectomy    . Pr vein bypass graft,aorto-fem-pop  2007   Review of Systems Denies any knee injury. No cough pain or swelling No claudication. No anterior knee pain.     Objective:   Physical Exam BP 170/80  Pulse 67  Temp(Src) 98.4 F (36.9 C) (Oral)  Wt 154 lb 12.8 oz (70.217 kg)  BMI 25 kg/m2  SpO2 95%  General -- alert, well-developed, NAD.   Extremities-- no pretibial edema bilaterally ; + Pedal pulses bilaterally, all toes normal color, warm, and good cap refill. Left knee normal Right knee: No effusion, redness, range of motion is normal but he did complain with pain with forced flexion. Popliteal area on the right is slightly puffy without mass, ecchymoses. Neurologic-- alert & oriented X3. Speech, gait normal.  Psych-- Cognition and judgment appear intact. Alert and cooperative with normal attention span and concentration. not anxious appearing and not depressed appearing.      Assessment & Plan:   R knee pain, Posterior right knee pain, Baker's cyst related? The patient has a history of peripheral vascular disease, vascular exam is good today. Plan:  Refer to [facility] orthopedic Tylenol as needed Call anytime if he has swelling in the popliteal area or pain distally from the knee

## 2013-06-15 ENCOUNTER — Encounter: Payer: Self-pay | Admitting: Internal Medicine

## 2013-08-11 ENCOUNTER — Ambulatory Visit (INDEPENDENT_AMBULATORY_CARE_PROVIDER_SITE_OTHER): Payer: Medicare Other | Admitting: Family Medicine

## 2013-08-11 ENCOUNTER — Encounter: Payer: Self-pay | Admitting: Family Medicine

## 2013-08-11 VITALS — BP 130/80 | HR 70 | Temp 98.3°F | Resp 16 | Wt 155.5 lb

## 2013-08-11 DIAGNOSIS — L97519 Non-pressure chronic ulcer of other part of right foot with unspecified severity: Secondary | ICD-10-CM | POA: Insufficient documentation

## 2013-08-11 DIAGNOSIS — L97509 Non-pressure chronic ulcer of other part of unspecified foot with unspecified severity: Secondary | ICD-10-CM

## 2013-08-11 DIAGNOSIS — Z23 Encounter for immunization: Secondary | ICD-10-CM

## 2013-08-11 NOTE — Progress Notes (Signed)
  Subjective:    Patient ID: [name], male    DOB: [birth year], 78 y.o.   MRN: [id number]  HPI Toe pain- R foot.  Pt w/ bunion of great toe, has corn/rough area on 1st IP joint that is pushing on/ulcerating PIP joint of 2nd toe.  Has been using Neosporin.  No pain of great toe.  + TTP over ulcer on 2nd toe.  Pt has been on 30 days of Keflex   Review of Systems For ROS see HPI     Objective:   Physical Exam  Vitals reviewed. Constitutional: He appears well-developed and well-nourished. No distress.  Cardiovascular: Intact distal pulses.   Musculoskeletal:  Bunion of R great toe Arthritic change w/ overlying callous of 1st IP joint w/ corresponding ulceration of 2nd PIP joint Base of ulcer is clean w/ surrounding callous  Skin: Skin is warm and dry.          Assessment & Plan:

## 2013-08-11 NOTE — Patient Instructions (Signed)
Apply peroxide to area, pat dry, apply neosporin and cover w/ bandaid Use your toe spacer Go to your podiatry appt tomorrow Call with any questions or concerns Hang in there!!!

## 2013-08-12 ENCOUNTER — Ambulatory Visit (INDEPENDENT_AMBULATORY_CARE_PROVIDER_SITE_OTHER): Payer: Medicare Other

## 2013-08-12 ENCOUNTER — Ambulatory Visit: Payer: Self-pay

## 2013-08-12 DIAGNOSIS — M2011 Hallux valgus (acquired), right foot: Secondary | ICD-10-CM

## 2013-08-12 DIAGNOSIS — L97509 Non-pressure chronic ulcer of other part of unspecified foot with unspecified severity: Secondary | ICD-10-CM

## 2013-08-12 DIAGNOSIS — M79609 Pain in unspecified limb: Secondary | ICD-10-CM

## 2013-08-12 DIAGNOSIS — M2040 Other hammer toe(s) (acquired), unspecified foot: Secondary | ICD-10-CM

## 2013-08-12 DIAGNOSIS — M2010 Hallux valgus (acquired), unspecified foot: Secondary | ICD-10-CM

## 2013-08-12 DIAGNOSIS — M2041 Other hammer toe(s) (acquired), right foot: Secondary | ICD-10-CM

## 2013-08-12 MED ORDER — SILVER SULFADIAZINE 1 % EX CREA: TOPICAL_CREAM | Freq: Every day | CUTANEOUS | Status: DC

## 2013-08-12 MED ORDER — CEPHALEXIN 500 MG PO CAPS: 500.0000 mg | ORAL_CAPSULE | Freq: Three times a day (TID) | ORAL | Status: DC

## 2013-08-12 NOTE — Patient Instructions (Signed)
ANTIBACTERIAL SOAP INSTRUCTIONS  THE DAY AFTER PROCEDURE  Please follow the instructions your doctor has marked.   Shower as usual. Before getting out, place a drop of antibacterial liquid soap (Dial) on a wet, clean washcloth.  Gently wipe washcloth over affected area.  Afterward, rinse the area with warm water.  Blot the area dry with a soft cloth and cover with antibiotic ointment (neosporin, polysporin, bacitracin) and band aid or gauze and tape  Place 3-4 drops of antibacterial liquid soap in a quart of warm tap water.  Submerge foot into water for 20 minutes.  If bandage was applied after your procedure, leave on to allow for easy lift off, then remove and continue with soak for the remaining time.  Next, blot area dry with a soft cloth and cover with a bandage.  Apply other medications as directed by your doctor, such as cortisporin otic solution (eardrops) or neosporin antibiotic ointment  Apply Silvadene cream and gauze dressing as instructed daily after soaking

## 2013-08-12 NOTE — Progress Notes (Signed)
  Subjective:    Patient ID: [name], male    DOB: [birth year], 78 y.o.   MRN: [id number]  Toe Pain  The pain is present in the right toes. The pain is at a severity of 0/10. The pain is mild.   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 This caused an irritation and ulceration with a keratosis second digit right foot. Patient been using a toe spacer   Review of Systems  Constitutional: Negative.   HENT: Negative.   Eyes: Negative.   Respiratory: Negative.   Cardiovascular: Negative.   Gastrointestinal: Negative.   Endocrine: Negative.   Genitourinary: Negative.   Musculoskeletal: Negative.   Skin: Negative.   Allergic/Immunologic: Negative.   Neurological: Negative.   Hematological: Negative.   Psychiatric/Behavioral: Negative.        Objective:   Physical Exam Vascular exam as follows pedal pulses difficult to palpate DP nonpalpable bilateral 0/4, PT +2/4 bilateral. Temperature cool turgor normal capillary refill timed slightly delayed to the hallux and lesser digits 4-5 seconds bilateral. Neurologically epicritic and proprioceptive sensations. He intact there is normal plantar response and DTRs. Patient does complain of night cramps. Patient is also has a history of coronary disease had stent placements every years ago done by Dr. Edilia Bo for coronary artery disease. Dermatologically skin color pigment normal hair growth diminished distally nail somewhat criptotic bilateral there is keratosis of ulceration proximal IP joint second digit right foot secondary to lateral deviation of the right hallux and bunion deformity. 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 Possible fracture of the second  digit is noted mild osteopenia on x-rays    Assessment & Plan:  Assessment ulcer with localized cellulitis second toe right foot secondary to hammertoe and bunion deformity. Plan at this time is the ulcer site is debrided down to the dermal subdermal junction Silvadene and gauze dressing are applied at this time. Tube foam. padding was dispensed a cushioned bunion and separate the toes wound care daily soaking instructions are given to patient and prescription for antibiotic is given. Followup with in 2 weeks for reevaluation patient may be candidate for surgical intervention/bunion hammertoe correction some point in the future.  Alvan Dame DPM

## 2013-08-26 ENCOUNTER — Ambulatory Visit (INDEPENDENT_AMBULATORY_CARE_PROVIDER_SITE_OTHER): Payer: Medicare Other

## 2013-08-26 VITALS — BP 134/65 | HR 68 | Resp 16

## 2013-08-26 DIAGNOSIS — M2041 Other hammer toe(s) (acquired), right foot: Secondary | ICD-10-CM

## 2013-08-26 DIAGNOSIS — M2010 Hallux valgus (acquired), unspecified foot: Secondary | ICD-10-CM

## 2013-08-26 DIAGNOSIS — L97519 Non-pressure chronic ulcer of other part of right foot with unspecified severity: Secondary | ICD-10-CM

## 2013-08-26 DIAGNOSIS — L97509 Non-pressure chronic ulcer of other part of unspecified foot with unspecified severity: Secondary | ICD-10-CM

## 2013-08-26 DIAGNOSIS — M2011 Hallux valgus (acquired), right foot: Secondary | ICD-10-CM

## 2013-08-26 DIAGNOSIS — M2040 Other hammer toe(s) (acquired), unspecified foot: Secondary | ICD-10-CM

## 2013-08-26 NOTE — Progress Notes (Signed)
  Subjective:    Patient ID: [name], male    DOB: 1935-06-30, 78 y.o.   MRN: [id number]  HPI Comments:  "It's doing better"   Follow up ulcer - 2nd toe right   ulcer medial surface second toe left foot is improving has been cleansing and applying Silvadene as instructed in daily dressing changes. Patient continues to have lateral deviation of hallux with deformity. Patient states he Dr. Edilia Bo for his cardiovascular followup I encouraged him to discuss the possible lower extremity arterial Doppler for evaluation of his lower extremity vascular status.    Review of Systems deferred at this time     Objective:   Physical Exam No changes since last visit pedal pulses palpable DP plus one over 4 and thready PT +2 over 4 bilateral skin temperature warm turgor normal there is also hemorrhage a keratosis medial surface second digit right which is debrided down to the dermal level no discharge or drainage decreased edema and erythema noted. There is still residual HAV deformity and rigid digital contractures noted. Patient would continue the a candidate for future bunion correction literature about bunions dispensed at this time.      Assessment & Plan:  Resolving/improving ulceration second toe right secondary digital deformity and bunion deformity. Patient will followup with Dr. Edilia Bo and get vascular assessment some point in the future. Followup in the future on an as-needed basis for possible surgical consult for bunion correction. Will maintain local wound care Silvadene and Band-Aid dressing to the toe. Maintain tubercle padding to keep the toe cushioned and separated. Maintain accommodative shoe gear at all time.  Alvan Dame DPM

## 2013-08-26 NOTE — Patient Instructions (Signed)

## 2013-08-27 NOTE — Assessment & Plan Note (Signed)
New.  Pt has been on 30 days of abx.  No evidence of current infxn but due to depth of ulcer and surrounding callous formation, will refer to podiatry urgently for tx.  Reviewed wound care.  Reviewed supportive care and red flags that should prompt return.  Pt expressed understanding and is in agreement w/ plan.

## 2013-09-06 ENCOUNTER — Encounter: Payer: Self-pay | Admitting: Family

## 2013-09-07 ENCOUNTER — Ambulatory Visit (HOSPITAL_COMMUNITY)
Admission: RE | Admit: 2013-09-07 | Discharge: 2013-09-07 | Disposition: A | Discharge status: Home / Self Care | Payer: Medicare Other | Source: Ambulatory Visit | Attending: Family | Admitting: Family

## 2013-09-07 ENCOUNTER — Ambulatory Visit (INDEPENDENT_AMBULATORY_CARE_PROVIDER_SITE_OTHER): Payer: Medicare Other | Admitting: Family

## 2013-09-07 ENCOUNTER — Encounter: Payer: Self-pay | Admitting: Family

## 2013-09-07 DIAGNOSIS — I6529 Occlusion and stenosis of unspecified carotid artery: Secondary | ICD-10-CM

## 2013-09-07 NOTE — Addendum Note (Signed)
Addended by: Lorin Mercy K on: 09/07/2013 11:00 AM   Modules accepted: Orders

## 2013-09-07 NOTE — Progress Notes (Signed)
Established Carotid Patient  History of Present Illness  [name] is a 78 y.o. male patient that Dr. Edilia Bo has been following for known carotid stenosis, he returns today for routine surveillance.  Patient has not had previous CEA. Saw Dr. Mayford Knife for chest pain, had cardiac stent placed in 2006 or 2007 and had no further chest pain, denies hx of MI. He has been followed yearly for carotid surveillance since his cardiac stent placement.  Patient has Negative history of TIA or stroke symptom.  The patient denies amaurosis fugax or monocular blindness.  The patient  denies facial drooping.  Pt. denies hemiplegia.  The patient denies receptive or expressive aphasia.  Pt. denies extremity weakness.   reports New Medical or Surgical History: planning right bunionectomy.  Pt Diabetic: No Pt smoker: non-smoker  Pt meds include: Statin : Yes Betablocker: Yes ASA: Yes Other anticoagulants/antiplatelets: no   Past Medical History  Diagnosis Date  . Hyperlipidemia   . Hypertension   . CAD (coronary artery disease)   . Peripheral vascular disease   . Vertigo   . Cognitive impairment, mild, so stated   . Colitis   . Vertebrobasilar artery syndrome   . Arthritis   . Elevated cholesterol     Social History History  Substance Use Topics  . Smoking status: Never Smoker   . Smokeless tobacco: Not on file  . Alcohol Use: Yes     Comment: wine nightly    Family History Family History  Problem Relation Age of Onset  . Stroke Mother   . Hyperlipidemia Mother   . Stroke Father   . Hyperlipidemia Father   . Hypertension Father     Surgical History Past Surgical History  Procedure Laterality Date  . Appendectomy    . Pr vein bypass graft,aorto-fem-pop  2007    No Known Allergies  Current Outpatient Prescriptions  Medication Sig Dispense Refill  . aspirin 325 MG tablet Take 325 mg by mouth at bedtime.      Marland Kitchen atorvastatin (LIPITOR) 40 MG tablet 1 TAB BY MOUTH  DAILY---LABS ARE DUE NOW  90 tablet  1  . carvedilol (COREG) 6.25 MG tablet Take 6.25 mg by mouth 2 (two) times daily.        . cephALEXin (KEFLEX) 500 MG capsule Take 1 capsule (500 mg total) by mouth 3 (three) times daily.  30 capsule  0  . clobetasol cream (TEMOVATE) 0.05 %       . donepezil (ARICEPT) 10 MG tablet Take 10 mg by mouth daily.        Marland Kitchen ezetimibe (ZETIA) 10 MG tablet Take 10 mg by mouth daily.        . fish oil-omega-3 fatty acids 1000 MG capsule Take 1 g by mouth 2 (two) times daily.      . hydrochlorothiazide 25 MG tablet Take 25 mg by mouth daily.        Marland Kitchen LOTEMAX 0.5 % GEL       . mometasone (NASONEX) 50 MCG/ACT nasal spray 2 sprays by Nasal route daily.        . naproxen (NAPROSYN) 500 MG tablet Take 1 tablet (500 mg total) by mouth 2 (two) times daily with a meal.  60 tablet  0  . silver sulfADIAZINE (SILVADENE) 1 % cream Apply topically daily.  50 g  0  . triamcinolone cream (KENALOG) 0.1 %        No current facility-administered medications for this visit.    Review of  Systems : [x]  Positive   [ ]  Denies  General:[ ]  Weight loss,  [ ]  Weight gain, [ ]  Loss of appetite, [ ]  Fever, [ ]  chills  Neurologic: [ ]  Dizziness, [ ]  Blackouts, [ ]  Headaches, [ ]  Seizure [ ]  Stroke, [ ]  "Mini stroke", [ ]  Slurred speech, [ ]  Temporary blindness;  [ ] weakness,  Ear/Nose/Throat: [ ]  Change in hearing, [ ]  Nose bleeds, [ ]  Hoarseness  Vascular:[ ]  Pain in legs with walking, [ ]  Pain in feet while lying flat , [ ]   Non-healing ulcer, [ ]  Blood clot in vein,    Pulmonary: [ ]  Home oxygen, [ ]   Productive cough, [ ]  Bronchitis, [ ]  Coughing up blood,  [ ]  Asthma, [ ]  Wheezing  Musculoskeletal:  [ ]  Arthritis, [ ]  Joint pain, [ ]  low back pain  Cardiac: [ ]  Chest pain, [ ]  Shortness of breath when lying flat, [ ]  Shortness of breath with exertion, [ ]  Palpitations, [ ]  Heart murmur, [ ]   Atrial fibrillation  Hematologic:[ ]  Easy Bruising, [ ]  Anemia; [ ]   Hepatitis  Psychiatric: [ ]   Depression, [ ]  Anxiety   Gastrointestinal: [ ]  Black stool, [ ]  Blood in stool, [ ]  Peptic ulcer disease,  [ ]  Gastroesophageal Reflux, [ ]  Trouble swallowing, [ ]  Diarrhea, [ ]  Constipation  Urinary: [ ]  chronic Kidney disease, [ ]  on HD, [ ]  Burning with urination, [ ]  Frequent urination, [ ]  Difficulty urinating;   Skin: [ ]  Rashes, [ ]  Wounds    Physical Examination  Filed Vitals:   09/07/13 1004  BP: 138/69  Pulse: 59  Resp: 16   Filed Weights   09/07/13 1004  Weight: 152 lb (68.947 kg)   Body mass index is 24.55 kg/(m^2).  General: WDWN male in NAD GAIT: normal Eyes: PERRLA Pulmonary:  CTAB, Negative  Rales, Negative rhonchi, & Negative wheezing.  Cardiac: regular Rhythm ,  Negative Murmurs.  VASCULAR EXAM Carotid Bruits Left Right   Negative Negative   Radial pulses are 3+ palpable and equal.                                                                                                                            LE Pulses LEFT RIGHT       POPLITEAL   palpable    palpable       POSTERIOR TIBIAL   palpable    palpable        DORSALIS PEDIS      ANTERIOR TIBIAL not palpable   palpable        PERONEAL  Palpable   not Palpable     Gastrointestinal: soft, nontender, BS WNL, no r/g,  negative masses.  Musculoskeletal: Negative muscle atrophy/wasting. M/S 5/5 throughout, Extremities without ischemic changes. Right foot prominent bunion. Note blister on medial aspect right third toe that pt. states is new; note scabbed area on medial aspect right second  toe with no signs of infection.  Neurologic: A&O X 3; Appropriate Affect ; SENSATION ;normal;  Speech is normal CN 2-12 intact, Pain and light touch intact in extremities, Motor exam as listed above.   Non-Invasive Vascular Imaging CAROTID DUPLEX 09/07/2013   Right ICA: <40% stenosis. Left ICA: <40% stenosis.  Previous carotid studies demonstrated: RICA no stenosis, LICA  <40% stenosis.  Assessment: [name] is a 78 y.o. male who presents with asymptomatic  minimal bilateral ICA  Stenosis. A year ago he had no plaque in right ICA by Duplex exam, seems to have minimal plaque on today's exam.  Patient states his podiatrist wants to know if he has adequate circulation in his right foot for healing after a bunionectomy. Based on today's exam and ROS, and without the benefit of ABI's or Duplex exam of right leg, it appears that he has no risk factors for poor wound healing, has no signs of ischemia in either foot.  Plan: Follow-up in 1 year with Carotid Duplex scan.   I discussed in depth with the patient the nature of atherosclerosis, and emphasized the importance of maximal medical management including strict control of blood pressure, blood glucose, and lipid levels, obtaining regular exercise, and continued cessation of smoking.  The patient is aware that without maximal medical management the underlying atherosclerotic disease process will progress, limiting the benefit of any interventions. The patient was given information about stroke prevention and what symptoms should prompt the patient to seek immediate medical care. Thank you for allowing [place] to participate in this patient's care.  Charisse March, RN, MSN, FNP-C [facility] Office: [phone]  Clinic Physician: Edilia Bo  09/07/2013 9:09 AM

## 2013-09-07 NOTE — Patient Instructions (Signed)
Stroke Prevention Some medical conditions and behaviors are associated with an increased chance of having a stroke. You may prevent a stroke by making healthy choices and managing medical conditions. Reduce your risk of having a stroke by:  Staying physically active. Get at least 30 minutes of activity on most or all days.  Not smoking. It may also be helpful to avoid exposure to secondhand smoke.  Limiting alcohol use. Moderate alcohol use is considered to be:  No more than 2 drinks per day for men.  No more than 1 drink per day for nonpregnant women.  Eating healthy foods.  Include 5 or more servings of fruits and vegetables a day.  Certain diets may be prescribed to address high blood pressure, high cholesterol, diabetes, or obesity.  Managing your cholesterol levels.  A low-saturated fat, low-trans fat, low-cholesterol, and high-fiber diet may control cholesterol levels.  Take any prescribed medicines to control cholesterol as directed by your caregiver.  Managing your diabetes.  A controlled-carbohydrate, controlled-sugar diet is recommended to manage diabetes.  Take any prescribed medicines to control diabetes as directed by your caregiver.  Controlling your high blood pressure (hypertension).  A low-salt (sodium), low-saturated fat, low-trans fat, and low-cholesterol diet is recommended to manage high blood pressure.  Take any prescribed medicines to control hypertension as directed by your caregiver.  Maintaining a healthy weight.  A reduced-calorie, low-sodium, low-saturated fat, low-trans fat, low-cholesterol diet is recommended to manage weight.  Stopping drug abuse.  Avoiding birth control pills.  Talk to your caregiver about the risks of taking birth control pills if you are over 35 years old, smoke, get migraines, or have ever had a blood clot.  Getting evaluated for sleep disorders (sleep apnea).  Talk to your caregiver about getting a sleep evaluation  if you snore a lot or have excessive sleepiness.  Taking medicines as directed by your caregiver.  For some people, aspirin or blood thinners (anticoagulants) are helpful in reducing the risk of forming abnormal blood clots that can lead to stroke. If you have the irregular heart rhythm of atrial fibrillation, you should be on a blood thinner unless there is a good reason you cannot take them.  Understand all your medicine instructions. SEEK IMMEDIATE MEDICAL CARE IF:   You have sudden weakness or numbness of the face, arm, or leg, especially on one side of the body.  You have sudden confusion.  You have trouble speaking (aphasia) or understanding.  You have sudden trouble seeing in one or both eyes.  You have sudden trouble walking.  You have dizziness.  You have a loss of balance or coordination.  You have a sudden, severe headache with no known cause.  You have new chest pain or an irregular heartbeat. Any of these symptoms may represent a serious problem that is an emergency. Do not wait to see if the symptoms will go away. Get medical help right away. Call your local emergency services (911 in [place] Do not drive yourself to the hospital. Document Released: 11/20/2004 Document Revised: 01/05/2012 Document Reviewed: 04/15/2013 [employer] Patient Information 2014 [employer].  

## 2013-09-14 ENCOUNTER — Telehealth: Payer: Self-pay

## 2013-09-14 NOTE — Telephone Encounter (Signed)
Flu vaccine--07/2013 Tdap--11/2007 Shingles--10/2010 PNA-past due CCS---10/2006--Dr Jarold Motto PSA--05/2012--2.76

## 2013-09-15 ENCOUNTER — Encounter: Payer: Self-pay | Admitting: Family Medicine

## 2013-09-15 ENCOUNTER — Encounter: Payer: Self-pay | Admitting: General Practice

## 2013-09-15 ENCOUNTER — Ambulatory Visit (INDEPENDENT_AMBULATORY_CARE_PROVIDER_SITE_OTHER): Payer: Medicare Other | Admitting: Family Medicine

## 2013-09-15 VITALS — BP 140/70 | HR 62 | Temp 98.2°F | Resp 16 | Ht 66.0 in | Wt 153.0 lb

## 2013-09-15 DIAGNOSIS — I10 Essential (primary) hypertension: Secondary | ICD-10-CM

## 2013-09-15 DIAGNOSIS — E785 Hyperlipidemia, unspecified: Secondary | ICD-10-CM

## 2013-09-15 DIAGNOSIS — Z0000 Encounter for general adult medical examination without abnormal findings: Secondary | ICD-10-CM

## 2013-09-15 DIAGNOSIS — H6120 Impacted cerumen, unspecified ear: Secondary | ICD-10-CM

## 2013-09-15 DIAGNOSIS — H6121 Impacted cerumen, right ear: Secondary | ICD-10-CM

## 2013-09-15 DIAGNOSIS — Z125 Encounter for screening for malignant neoplasm of prostate: Secondary | ICD-10-CM

## 2013-09-15 LAB — BASIC METABOLIC PANEL
BUN: 17 mg/dL (ref 6–23)
Chloride: 101 mEq/L (ref 96–112)
GFR: 78.06 mL/min (ref >60.00)
Potassium: 3.5 mEq/L (ref 3.5–5.1)
Sodium: 138 mEq/L (ref 135–145)

## 2013-09-15 LAB — CBC WITH DIFFERENTIAL/PLATELET
Basophils Absolute: 0 10*3/uL (ref 0.0–0.1)
Eosinophils Absolute: 0.2 10*3/uL (ref 0.0–0.7)
Lymphocytes Relative: 24.8 % (ref 12.0–46.0)
MCHC: 33.9 g/dL (ref 30.0–36.0)
MCV: 89.7 fl (ref 78.0–100.0)
Monocytes Absolute: 0.7 10*3/uL (ref 0.1–1.0)
Neutrophils Relative %: 61.9 % (ref 43.0–77.0)
Platelets: 175 10*3/uL (ref 150.0–400.0)
RBC: 4.77 Mil/uL (ref 4.22–5.81)
RDW: 13.7 % (ref 11.5–14.6)

## 2013-09-15 LAB — HEPATIC FUNCTION PANEL
ALT: 32 U/L (ref 0–53)
AST: 33 U/L (ref 0–37)
Bilirubin, Direct: 0.1 mg/dL (ref 0.0–0.3)
Total Bilirubin: 0.9 mg/dL (ref 0.3–1.2)

## 2013-09-15 LAB — PSA: PSA: 2.48 ng/mL (ref 0.10–4.00)

## 2013-09-15 LAB — LIPID PANEL
HDL: 44.9 mg/dL (ref >39.00)
LDL Cholesterol: 51 mg/dL (ref 0–99)
Triglycerides: 99 mg/dL (ref 0.0–149.0)
VLDL: 19.8 mg/dL (ref 0.0–40.0)

## 2013-09-15 LAB — TSH: TSH: 2.24 u[IU]/mL (ref 0.35–5.50)

## 2013-09-15 NOTE — Patient Instructions (Signed)
Follow up in 6 months to recheck BP and cholesterol We'll notify you of your lab results and make any changes if needed Keep up the good work!  You look great! Drip peroxide into the ear daily to dissolve the wax Call with any questions or concerns Happy Holidays!!!

## 2013-09-15 NOTE — Progress Notes (Signed)
  Subjective:    Patient ID: [name], male    DOB: May 01, 1935, 78 y.o.   MRN: [id number]  HPI Pre visit review using our clinic review tool, if applicable. No additional management support is needed unless otherwise documented below in the visit note.  Here today for CPE.  Risk Factors: HTN- chronic problem, on HCTZ, Coreg.  No CP, SOB, HAs, visual changes, edema.  Hyperlipidemia- chronic problem, on Zetia, Lipitor, fish oil.  No abd pain, N/V, myalgias.  CAD- chronic problem, following w/ cardiology.  Goal is BP and lipid control.  Physical Activity: exercising regularly Fall Risk: low Depression: no current sxs Hearing: normal to conversational tones and whispered voice ADL's: independent Cognitive: normal linear thought process, memory and attention intact Home Safety: safe at home, lives w/ wife Height, Weight, BMI, Visual Acuity: see vitals, vision corrected to 20/20 w/ glasses Counseling: UTD on colonoscopy Labs Ordered: See A&P Care Plan: See A&P    Review of Systems Patient reports no vision/hearing changes, anorexia, fever ,adenopathy, persistant/recurrent hoarseness, swallowing issues, chest pain, palpitations, edema, persistant/recurrent cough, hemoptysis, dyspnea (rest,exertional, paroxysmal nocturnal), gastrointestinal  bleeding (melena, rectal bleeding), abdominal pain, excessive heart burn, GU symptoms (dysuria, hematuria, voiding/incontinence issues) syncope, focal weakness, memory loss, numbness & tingling, skin/hair/nail changes, depression, anxiety, abnormal bruising/bleeding, musculoskeletal symptoms/signs.     Objective:   Physical Exam BP 140/70  Pulse 62  Temp(Src) 98.2 F (36.8 C) (Oral)  Resp 16  Ht 5\' 6"  (1.676 m)  Wt 153 lb (69.4 kg)  BMI 24.71 kg/m2  SpO2 98%  General Appearance:    Alert, cooperative, no distress, appears stated age  Head:    Normocephalic, without obvious abnormality, atraumatic  Eyes:    PERRL, conjunctiva/corneas clear,  EOM's intact, fundi    benign, both eyes       Ears:    R TM obscured by cerumen, large amount removed w/ curette   L TM WNL  Nose:   Nares normal, septum midline, mucosa normal, no drainage   or sinus tenderness  Throat:   Lips, mucosa, and tongue normal; teeth and gums normal  Neck:   Supple, symmetrical, trachea midline, no adenopathy;       thyroid:  No enlargement/tenderness/nodules  Back:     Symmetric, no curvature, ROM normal, no CVA tenderness  Lungs:     Clear to auscultation bilaterally, respirations unlabored  Chest wall:    No tenderness or deformity  Heart:    Regular rate and rhythm, S1 and S2 normal, no murmur, rub   or gallop  Abdomen:     Soft, non-tender, bowel sounds active all four quadrants,    no masses, no organomegaly  Genitalia:    Normal male without lesion, discharge or tenderness  Rectal:    Normal tone, normal prostate, no masses or tenderness  Extremities:   Extremities normal, atraumatic, no cyanosis or edema  Pulses:   2+ and symmetric all extremities  Skin:   Skin color, texture, turgor normal, no rashes or lesions  Lymph nodes:   Cervical, supraclavicular, and axillary nodes normal  Neurologic:   CNII-XII intact. Normal strength, sensation and reflexes      throughout          Assessment & Plan:

## 2013-09-15 NOTE — Assessment & Plan Note (Addendum)
New.  R ear.  Unsuccessful attempt at curette.  Unsuccessful irrigation.  Pt to use H2O2 drops in ear to soften and dissolve cerumen.  Pt expressed understanding and is in agreement w/ plan.

## 2013-09-15 NOTE — Telephone Encounter (Signed)
No CB pre visit 

## 2013-09-18 NOTE — Assessment & Plan Note (Signed)
Pt's PE WNL w/ exception of R ear cerumen.  UTD on health maintenance.  Check labs.  Anticipatory guidance provided.

## 2013-09-18 NOTE — Assessment & Plan Note (Signed)
Chronic problem.  Adequate control.  Asymptomatic.  Check labs.  No anticipated med changes 

## 2013-09-18 NOTE — Assessment & Plan Note (Signed)
Chronic problem.  Tolerating meds w/out difficulty.  Check labs.  Adjust meds prn  

## 2013-09-26 ENCOUNTER — Ambulatory Visit: Payer: Medicare Other | Admitting: Cardiology

## 2013-09-30 ENCOUNTER — Ambulatory Visit (INDEPENDENT_AMBULATORY_CARE_PROVIDER_SITE_OTHER): Payer: Medicare Other

## 2013-09-30 VITALS — BP 138/73 | HR 68

## 2013-09-30 DIAGNOSIS — M2011 Hallux valgus (acquired), right foot: Secondary | ICD-10-CM

## 2013-09-30 DIAGNOSIS — L97509 Non-pressure chronic ulcer of other part of unspecified foot with unspecified severity: Secondary | ICD-10-CM

## 2013-09-30 DIAGNOSIS — M2010 Hallux valgus (acquired), unspecified foot: Secondary | ICD-10-CM

## 2013-09-30 NOTE — Progress Notes (Signed)
   Subjective:    Patient ID: Marcas Bowsher, male    DOB: 1935/07/31, 78 y.o.   MRN: [id number]  HPI my second toe on the right foot and is much better    Review of Systems no new findings     Objective:   Physical Exam Neurovascular status intact and unchanged. The ulcer medial second digit right foot is resolved there is no discharge or drainage. Maintain tubercle padding which is issued to patient this time. Patient also some dry eschar tissue and most eczematous or psoriatic type plaque distal tuft of the digits sub-first MTP joint right. No new signs of infection no ulceration noted patient continues to have problems with bunion deformity and hammertoe deformities will maintain padding and accommodative shoes       Assessment & Plan:  Result ulcer second toe right foot. Dispensed and tubercle padding to keep the area cushioned. For dry fissured skin areas maintain a lotion such as Eucerin or any other hand lotion is appropriate. Keep the tubercle padding on her toes maintain accommodative shoes at all times discharge to an as-needed basis for future followup  Alvan Dame DPM

## 2013-09-30 NOTE — Patient Instructions (Signed)
ANTIBACTERIAL SOAP INSTRUCTIONS  THE DAY AFTER PROCEDURE  Please follow the instructions your doctor has marked.   Shower as usual. Before getting out, place a drop of antibacterial liquid soap (Dial) on a wet, clean washcloth.  Gently wipe washcloth over affected area.  Afterward, rinse the area with warm water.  Blot the area dry with a soft cloth and cover with antibiotic ointment (neosporin, polysporin, bacitracin) and band aid or gauze and tape  Place 3-4 drops of antibacterial liquid soap in a quart of warm tap water.  Submerge foot into water for 20 minutes.  If bandage was applied after your procedure, leave on to allow for easy lift off, then remove and continue with soak for the remaining time.  Next, blot area dry with a soft cloth and cover with a bandage.  Apply other medications as directed by your doctor, such as cortisporin otic solution (eardrops) or neosporin antibiotic ointment  Use any lotion on a daily basis for the dry skin areas consider Eucerin cream or Vaseline intensive care cream or lotion.

## 2013-10-07 ENCOUNTER — Encounter: Payer: Self-pay | Admitting: Cardiology

## 2013-10-07 ENCOUNTER — Ambulatory Visit (INDEPENDENT_AMBULATORY_CARE_PROVIDER_SITE_OTHER): Payer: Medicare Other | Admitting: Cardiology

## 2013-10-07 VITALS — BP 126/66 | HR 70 | Ht 66.0 in | Wt 154.0 lb

## 2013-10-07 DIAGNOSIS — E785 Hyperlipidemia, unspecified: Secondary | ICD-10-CM

## 2013-10-07 DIAGNOSIS — I10 Essential (primary) hypertension: Secondary | ICD-10-CM

## 2013-10-07 DIAGNOSIS — I2510 Atherosclerotic heart disease of native coronary artery without angina pectoris: Secondary | ICD-10-CM

## 2013-10-07 NOTE — Progress Notes (Signed)
[address] [place], [place]  [address] Phone: [phone] Fax:  [phone]  Date:  10/07/2013   ID:  Kamryn Gauthier, DOB 1935-07-27, MRN [id number]  PCP:  Neena Rhymes, MD  Cardiologist:  Armanda Magic, MD     History of Present Illness: [name] is a 78 y.o. male with a history of ASCAD, HTN and dyslipidemia who presents today for followup.  He is doing well.  He denies any chest pain, SOB, DOE, LE edema, dizziness, palpitations or syncope.   Wt Readings from Last 3 Encounters:  10/07/13 154 lb (69.854 kg)  09/15/13 153 lb (69.4 kg)  09/07/13 152 lb (68.947 kg)     Past Medical History  Diagnosis Date  . Hyperlipidemia   . Hypertension   . Peripheral vascular disease   . Vertigo   . Cognitive impairment, mild, so stated   . Colitis   . Vertebrobasilar artery syndrome   . Arthritis   . Elevated cholesterol   . Carotid artery occlusion   . DVT (deep venous thrombosis)   . CAD (coronary artery disease) 2005    s/p PCI of RCA  . Erectile dysfunction   . GERD (gastroesophageal reflux disease)     Current Outpatient Prescriptions  Medication Sig Dispense Refill  . aspirin 325 MG tablet Take 325 mg by mouth at bedtime.      Marland Kitchen atorvastatin (LIPITOR) 40 MG tablet 1 TAB BY MOUTH DAILY---LABS ARE DUE NOW  90 tablet  1  . carvedilol (COREG) 6.25 MG tablet Take 6.25 mg by mouth 2 (two) times daily.        . clobetasol cream (TEMOVATE) 0.05 %       . donepezil (ARICEPT) 10 MG tablet Take 10 mg by mouth daily.        Marland Kitchen ezetimibe (ZETIA) 10 MG tablet Take 10 mg by mouth daily.        . fish oil-omega-3 fatty acids 1000 MG capsule Take 1 g by mouth 2 (two) times daily.      . hydrochlorothiazide 25 MG tablet Take 25 mg by mouth daily.        Marland Kitchen LOTEMAX 0.5 % GEL       . naproxen (NAPROSYN) 500 MG tablet Take 1 tablet (500 mg total) by mouth 2 (two) times daily with a meal.  60 tablet  0  . mometasone (NASONEX) 50 MCG/ACT nasal spray 2 sprays by Nasal route  daily.        . silver sulfADIAZINE (SILVADENE) 1 % cream Apply topically daily.  50 g  0  . triamcinolone cream (KENALOG) 0.1 %        No current facility-administered medications for this visit.    Allergies:   No Known Allergies  Social History:  The patient  reports that he has quit smoking. He has never used smokeless tobacco. He reports that he drinks alcohol. He reports that he does not use illicit drugs.   Family History:  The patient's family history includes Hyperlipidemia in his father and mother; Hypertension in his father and mother; Stroke in his father and mother.   ROS:  Please see the history of present illness.      All other systems reviewed and negative.   PHYSICAL EXAM: VS:  BP 126/66  Pulse 70  Ht 5\' 6"  (1.676 m)  Wt 154 lb (69.854 kg)  BMI 24.87 kg/m2 Well nourished, well developed, in no acute distress HEENT: normal Neck: no JVD Cardiac:  normal S1, S2; RRR; no murmur Lungs:  clear to auscultation bilaterally, no wheezing, rhonchi or rales Abd: soft, nontender, no hepatomegaly Ext: no edema Skin: warm and dry Neuro:  CNs 2-12 intact, no focal abnormalities noted  EKG:  NSR with no ST changes     ASSESSMENT AND PLAN:  1. ASCAD  - continue ASA 2. HTN - well controlled  - continue carvedilol/HCTZ 3. Dyslipidemia - LDL at goal  - continue Zetia/fish oil and Lipitor  Followup with me in 1 year  Signed, Armanda Magic, MD 10/07/2013 8:35 AM

## 2013-10-07 NOTE — Patient Instructions (Signed)
Your physician recommends that you continue on your current medications as directed. Please refer to the Current Medication list given to you today.  Your physician wants you to follow-up in: 1 Year with Dr Turner You will receive a reminder letter in the mail two months in advance. If you don't receive a letter, please call our office to schedule the follow-up appointment.  

## 2013-11-14 ENCOUNTER — Other Ambulatory Visit: Payer: Self-pay

## 2013-11-14 MED ORDER — HYDROCHLOROTHIAZIDE 25 MG PO TABS: 25.0000 mg | ORAL_TABLET | Freq: Every day | ORAL | Status: DC

## 2013-12-02 ENCOUNTER — Other Ambulatory Visit: Payer: Self-pay | Admitting: General Practice

## 2013-12-02 MED ORDER — ATORVASTATIN CALCIUM 40 MG PO TABS: ORAL_TABLET | ORAL | Status: DC

## 2013-12-06 ENCOUNTER — Other Ambulatory Visit: Payer: Self-pay | Admitting: *Deleted

## 2013-12-06 NOTE — Telephone Encounter (Signed)
Rx was refilled on (12-02-13).//AB/CMA

## 2013-12-14 ENCOUNTER — Telehealth: Payer: Self-pay

## 2013-12-14 ENCOUNTER — Other Ambulatory Visit: Payer: Self-pay | Admitting: General Practice

## 2013-12-14 MED ORDER — ATORVASTATIN CALCIUM 40 MG PO TABS: ORAL_TABLET | ORAL | Status: DC

## 2013-12-14 NOTE — Telephone Encounter (Signed)
Spoke with pt and advised that we will check labs at next appt.

## 2013-12-14 NOTE — Telephone Encounter (Signed)
The patient called extremely upset because he states he received his Lipitor 40mg  medication through [facility] after attempting to have it refilled for several weeks.  He is wanting to know why there are no refills on this medication.  He states he usually receives this medication with several refills attached.  He is hoping for a call back as soon as possible -   Callback - [phone] , cell - [phone]

## 2014-01-25 ENCOUNTER — Other Ambulatory Visit: Payer: Self-pay

## 2014-01-25 MED ORDER — EZETIMIBE 10 MG PO TABS: 10.0000 mg | ORAL_TABLET | Freq: Every day | ORAL | Status: DC

## 2014-01-25 MED ORDER — CARVEDILOL 6.25 MG PO TABS: 6.2500 mg | ORAL_TABLET | Freq: Two times a day (BID) | ORAL | Status: DC

## 2014-08-03 ENCOUNTER — Encounter: Payer: Self-pay | Admitting: Cardiology

## 2014-08-10 ENCOUNTER — Other Ambulatory Visit: Payer: Self-pay

## 2014-08-10 MED ORDER — HYDROCHLOROTHIAZIDE 25 MG PO TABS: 25.0000 mg | ORAL_TABLET | Freq: Every day | ORAL | Status: DC

## 2014-09-08 ENCOUNTER — Encounter: Payer: Self-pay | Admitting: General Practice

## 2014-09-08 ENCOUNTER — Other Ambulatory Visit: Payer: Self-pay | Admitting: General Practice

## 2014-09-08 MED ORDER — ATORVASTATIN CALCIUM 40 MG PO TABS: ORAL_TABLET | ORAL | Status: DC

## 2014-09-12 ENCOUNTER — Encounter: Payer: Self-pay | Admitting: Family

## 2014-09-13 ENCOUNTER — Ambulatory Visit: Payer: Medicare Other | Admitting: Family

## 2014-09-13 ENCOUNTER — Ambulatory Visit (INDEPENDENT_AMBULATORY_CARE_PROVIDER_SITE_OTHER): Payer: Medicare Other | Admitting: Family

## 2014-09-13 ENCOUNTER — Ambulatory Visit (HOSPITAL_COMMUNITY)
Admission: RE | Admit: 2014-09-13 | Discharge: 2014-09-13 | Disposition: A | Discharge status: Home / Self Care | Payer: Medicare Other | Source: Ambulatory Visit | Attending: Family | Admitting: Family

## 2014-09-13 ENCOUNTER — Other Ambulatory Visit (HOSPITAL_COMMUNITY): Payer: Medicare Other

## 2014-09-13 ENCOUNTER — Encounter: Payer: Self-pay | Admitting: Family

## 2014-09-13 VITALS — BP 125/68 | HR 69 | Resp 16 | Ht 66.0 in | Wt 154.0 lb

## 2014-09-13 DIAGNOSIS — I6523 Occlusion and stenosis of bilateral carotid arteries: Secondary | ICD-10-CM | POA: Diagnosis not present

## 2014-09-13 NOTE — Patient Instructions (Signed)
Stroke Prevention Some medical conditions and behaviors are associated with an increased chance of having a stroke. You may prevent a stroke by making healthy choices and managing medical conditions. HOW CAN I REDUCE MY RISK OF HAVING A STROKE?   Stay physically active. Get at least 30 minutes of activity on most or all days.  Do not smoke. It may also be helpful to avoid exposure to secondhand smoke.  Limit alcohol use. Moderate alcohol use is considered to be:  No more than 2 drinks per day for men.  No more than 1 drink per day for nonpregnant women.  Eat healthy foods. This involves:  Eating 5 or more servings of fruits and vegetables a day.  Making dietary changes that address high blood pressure (hypertension), high cholesterol, diabetes, or obesity.  Manage your cholesterol levels.  Making food choices that are high in fiber and low in saturated fat, trans fat, and cholesterol may control cholesterol levels.  Take any prescribed medicines to control cholesterol as directed by your health care provider.  Manage your diabetes.  Controlling your carbohydrate and sugar intake is recommended to manage diabetes.  Take any prescribed medicines to control diabetes as directed by your health care provider.  Control your hypertension.  Making food choices that are low in salt (sodium), saturated fat, trans fat, and cholesterol is recommended to manage hypertension.  Take any prescribed medicines to control hypertension as directed by your health care provider.  Maintain a healthy weight.  Reducing calorie intake and making food choices that are low in sodium, saturated fat, trans fat, and cholesterol are recommended to manage weight.  Stop drug abuse.  Avoid taking birth control pills.  Talk to your health care provider about the risks of taking birth control pills if you are over 35 years old, smoke, get migraines, or have ever had a blood clot.  Get evaluated for sleep  disorders (sleep apnea).  Talk to your health care provider about getting a sleep evaluation if you snore a lot or have excessive sleepiness.  Take medicines only as directed by your health care provider.  For some people, aspirin or blood thinners (anticoagulants) are helpful in reducing the risk of forming abnormal blood clots that can lead to stroke. If you have the irregular heart rhythm of atrial fibrillation, you should be on a blood thinner unless there is a good reason you cannot take them.  Understand all your medicine instructions.  Make sure that other conditions (such as anemia or atherosclerosis) are addressed. SEEK IMMEDIATE MEDICAL CARE IF:   You have sudden weakness or numbness of the face, arm, or leg, especially on one side of the body.  Your face or eyelid droops to one side.  You have sudden confusion.  You have trouble speaking (aphasia) or understanding.  You have sudden trouble seeing in one or both eyes.  You have sudden trouble walking.  You have dizziness.  You have a loss of balance or coordination.  You have a sudden, severe headache with no known cause.  You have new chest pain or an irregular heartbeat. Any of these symptoms may represent a serious problem that is an emergency. Do not wait to see if the symptoms will go away. Get medical help at once. Call your local emergency services (911 in [place] Do not drive yourself to the hospital. Document Released: 11/20/2004 Document Revised: 02/27/2014 Document Reviewed: 04/15/2013 Exit[employer] Patient Information 2015 [employer]. This information is not intended to replace advice given   to you by your health care provider. Make sure you discuss any questions you have with your health care provider.  

## 2014-09-13 NOTE — Progress Notes (Signed)
Established Carotid Patient   History of Present Illness  [name] is a 79 y.o. male patient that Dr. Edilia Boickson has followed yearly for carotid artery surveillance since his cardiac stent placement and returns today for follow up.  Patient has not had previous carotid artery intervention. Saw Dr. Mayford Knifeurner for chest pain, had cardiac stent placed in 2006 or 2007 and had no further chest pain, denies hx of MI.  Patient has Negative history of TIA or stroke symptom. The patient denies amaurosis fugax or monocular blindness. The patient denies facial drooping. Pt. denies hemiplegia. The patient denies receptive or expressive aphasia. Pt. denies extremity weakness.  The patient denies claudication symptoms with walking.  Pt reports no New Medical or Surgical History.  Pt Diabetic: No Pt smoker: non-smoker  Pt meds include: Statin : Yes Betablocker: Yes ASA: Yes Other anticoagulants/antiplatelets: no  Past Medical History  Diagnosis Date  . Hyperlipidemia   . Hypertension   . Peripheral vascular disease   . Vertigo   . Cognitive impairment, mild, so stated   . Colitis   . Vertebrobasilar artery syndrome   . Arthritis   . Elevated cholesterol   . Carotid artery occlusion   . DVT (deep venous thrombosis)   . CAD (coronary artery disease) 2005    s/p PCI of RCA  . Erectile dysfunction   . GERD (gastroesophageal reflux disease)     Social History History  Substance Use Topics  . Smoking status: Former Games developermoker  . Smokeless tobacco: Never Used  . Alcohol Use: Yes     Comment: wine nightly    Family History Family History  Problem Relation Age of Onset  . Stroke Mother   . Hyperlipidemia Mother   . Hypertension Mother   . Stroke Father   . Hyperlipidemia Father   . Hypertension Father     Surgical History Past Surgical History  Procedure Laterality Date  . Appendectomy    . Pr vein bypass graft,aorto-fem-pop  2007    No Known Allergies  Current  Outpatient Prescriptions  Medication Sig Dispense Refill  . aspirin 325 MG tablet Take 325 mg by mouth at bedtime.    Marland Kitchen. atorvastatin (LIPITOR) 40 MG tablet 1 TAB BY MOUTH DAILY 90 tablet 0  . carvedilol (COREG) 6.25 MG tablet Take 1 tablet (6.25 mg total) by mouth 2 (two) times daily. 180 tablet 2  . clobetasol cream (TEMOVATE) 0.05 %     . donepezil (ARICEPT) 10 MG tablet Take 10 mg by mouth daily.      Marland Kitchen. ezetimibe (ZETIA) 10 MG tablet Take 1 tablet (10 mg total) by mouth daily. 90 tablet 3  . fish oil-omega-3 fatty acids 1000 MG capsule Take 1 g by mouth 2 (two) times daily.    . hydrochlorothiazide (HYDRODIURIL) 25 MG tablet Take 1 tablet (25 mg total) by mouth daily. 90 tablet 2  . LOTEMAX 0.5 % GEL     . mometasone (NASONEX) 50 MCG/ACT nasal spray 2 sprays by Nasal route daily.      . silver sulfADIAZINE (SILVADENE) 1 % cream Apply topically daily. 50 g 0  . triamcinolone cream (KENALOG) 0.1 %      No current facility-administered medications for this visit.    Review of Systems : See HPI for pertinent positives and negatives.  Physical Examination  Filed Vitals:   09/13/14 1016 09/13/14 1019  BP: 143/74 125/68  Pulse: 69 69  Resp:  16  Height:  5\' 6"  (1.676 m)  Weight:  154 lb (69.854 kg)  SpO2:  98%   Body mass index is 24.87 kg/(m^2).  General: WDWN male in NAD GAIT: normal Eyes: PERRLA Pulmonary: CTAB, Negative Rales, Negative rhonchi, & Negative wheezing.  Cardiac: regular Rhythm , Negative Murmurs.  VASCULAR EXAM Carotid Bruits Left Right   Negative Negative  Radial pulses are 2+ palpable and equal. Aorta is not palpable.      LE Pulses LEFT RIGHT   POPLITEAL not  palpable   not palpable   POSTERIOR TIBIAL  palpable   palpable    DORSALIS PEDIS  ANTERIOR TIBIAL not palpable   palpable     Gastrointestinal: soft, nontender, BS WNL, no r/g, no palpated masses.  Musculoskeletal: Negative muscle atrophy/wasting. M/S 5/5 throughout, Extremities without ischemic changes. No peripheral edema.   Neurologic: A&O X 3; Appropriate Affect ; SENSATION ;normal;  Speech is normal CN 2-12 intact, Pain and light touch intact in extremities, Motor exam as listed above.    Non-Invasive Vascular Imaging CAROTID DUPLEX 09/13/2014   CEREBROVASCULAR DUPLEX EVALUATION    INDICATION: Carotid artery stenosis     PREVIOUS INTERVENTION(S): NA    DUPLEX EXAM:     RIGHT  LEFT  Peak Systolic Velocities (cm/s) End Diastolic Velocities (cm/s) Plaque LOCATION Peak Systolic Velocities (cm/s) End Diastolic Velocities (cm/s) Plaque  106 16  CCA PROXIMAL 117 12   105 17 HT CCA MID 122 13 HT  101 20 HT CCA DISTAL 111 15 HM  125 11 HT ECA 108 11   142 24 HT ICA PROXIMAL 158 30 HM  112 28  ICA MID 160 30   124 19  ICA DISTAL 105 31     1.35 ICA / CCA Ratio (PSV) 1.42  Antegrade Vertebral Flow Antegrade  146 Brachial Systolic Pressure (mmHg) 140  Triphasic Brachial Artery Waveforms Triphasic    Plaque Morphology:  HM = Homogeneous, HT = Heterogeneous, CP = Calcific Plaque, SP = Smooth Plaque, IP = Irregular Plaque     ADDITIONAL FINDINGS:     IMPRESSION: Bilateral internal carotid artery stenosis present in the less than 40% range.    Compared to the previous exam:  Unchanged since previous study on 09/07/2013.      Assessment: [name] is a 79 y.o. male who presents with asymptomatic minimal bilateral ICA stenosis.  Unchanged since previous study on 09/07/2013.    Plan: Follow-up in 1 year with Carotid Duplex.   I discussed in depth with the patient the nature of atherosclerosis, and emphasized the importance of maximal medical management including strict control of blood pressure, blood glucose, and lipid levels, obtaining regular exercise, and continued  cessation of smoking.  The patient is aware that without maximal medical management the underlying atherosclerotic disease process will progress, limiting the benefit of any interventions. The patient was given information about stroke prevention and what symptoms should prompt the patient to seek immediate medical care. Thank you for allowing us to participate in this patient's care.  Charisse MarchSuzanne Marnae Madani, RN, MSN, FNP-C [facility] [place] Office: [phone]  Clinic Physician: Edilia BoDickson   09/13/2014 10:00 AM

## 2014-09-29 ENCOUNTER — Encounter: Payer: Self-pay | Admitting: Family Medicine

## 2014-09-29 ENCOUNTER — Ambulatory Visit (INDEPENDENT_AMBULATORY_CARE_PROVIDER_SITE_OTHER): Payer: Medicare Other | Admitting: Family Medicine

## 2014-09-29 VITALS — BP 124/76 | HR 74 | Temp 98.2°F | Resp 16 | Wt 154.1 lb

## 2014-09-29 DIAGNOSIS — I10 Essential (primary) hypertension: Secondary | ICD-10-CM

## 2014-09-29 DIAGNOSIS — E785 Hyperlipidemia, unspecified: Secondary | ICD-10-CM

## 2014-09-29 NOTE — Assessment & Plan Note (Signed)
Chronic problem.  Tolerating statin and Zetia w/o difficulty.  Check labs.  Adjust meds prn  

## 2014-09-29 NOTE — Assessment & Plan Note (Signed)
Chronic problem.  Well controlled.  Asymptomatic.  Check labs.  No anticipated med changes. 

## 2014-09-29 NOTE — Progress Notes (Signed)
   Subjective:    Patient ID: Viviana SimplerJack Denise, male    DOB: [birth year], 79 y.o.   MRN: [id number]  HPI HTN- chronic problem, on HCTZ and Coreg.  Denies CP, SOB, HAs, visual changes, edema.  Hyperlipidemia- chronic problem, on Lipitor and Zetia.  No abd pain, N/V, myalgias.  UTD on flu shot   Review of Systems For ROS see HPI     Objective:   Physical Exam  Constitutional: He is oriented to person, place, and time. He appears well-developed and well-nourished. No distress.  HENT:  Head: Normocephalic and atraumatic.  Eyes: Conjunctivae and EOM are normal. Pupils are equal, round, and reactive to light.  Neck: Normal range of motion. Neck supple. No thyromegaly present.  Cardiovascular: Normal rate, regular rhythm, normal heart sounds and intact distal pulses.   No murmur heard. Pulmonary/Chest: Effort normal and breath sounds normal. No respiratory distress.  Abdominal: Soft. Bowel sounds are normal. He exhibits no distension.  Musculoskeletal: He exhibits no edema.  Lymphadenopathy:    He has no cervical adenopathy.  Neurological: He is alert and oriented to person, place, and time. No cranial nerve deficit.  Skin: Skin is warm and dry.  Psychiatric: He has a normal mood and affect. His behavior is normal.  Vitals reviewed.         Assessment & Plan:

## 2014-09-29 NOTE — Progress Notes (Signed)
Pre visit review using our clinic review tool, if applicable. No additional management support is needed unless otherwise documented below in the visit note. 

## 2014-10-01 LAB — BASIC METABOLIC PANEL
BUN: 20 mg/dL (ref 6–23)
CHLORIDE: 102 meq/L (ref 96–112)
CO2: 31 mEq/L (ref 19–32)
Calcium: 9.4 mg/dL (ref 8.4–10.5)
Creatinine, Ser: 1.1 mg/dL (ref 0.4–1.5)
GFR: 66.83 mL/min (ref >60.00)
GLUCOSE: 89 mg/dL (ref 70–99)
Potassium: 5 mEq/L (ref 3.5–5.1)
Sodium: 140 mEq/L (ref 135–145)

## 2014-10-01 LAB — HEPATIC FUNCTION PANEL
ALBUMIN: 4.2 g/dL (ref 3.5–5.2)
ALT: 42 U/L (ref 0–53)
AST: 42 U/L — ABNORMAL HIGH (ref 0–37)
Alkaline Phosphatase: 58 U/L (ref 39–117)
BILIRUBIN TOTAL: 1.1 mg/dL (ref 0.2–1.2)
Bilirubin, Direct: 0.1 mg/dL (ref 0.0–0.3)
Total Protein: 6.8 g/dL (ref 6.0–8.3)

## 2014-10-01 LAB — CBC WITH DIFFERENTIAL/PLATELET
BASOS PCT: 0.3 % (ref 0.0–3.0)
Basophils Absolute: 0 10*3/uL (ref 0.0–0.1)
Eosinophils Absolute: 0.1 10*3/uL (ref 0.0–0.7)
Eosinophils Relative: 2 % (ref 0.0–5.0)
HCT: 46 % (ref 39.0–52.0)
Hemoglobin: 15.3 g/dL (ref 13.0–17.0)
Lymphocytes Relative: 28.1 % (ref 12.0–46.0)
Lymphs Abs: 1.7 10*3/uL (ref 0.7–4.0)
MCHC: 33.4 g/dL (ref 30.0–36.0)
MCV: 90.1 fl (ref 78.0–100.0)
MONOS PCT: 8.2 % (ref 3.0–12.0)
Monocytes Absolute: 0.5 10*3/uL (ref 0.1–1.0)
Neutro Abs: 3.6 10*3/uL (ref 1.4–7.7)
Neutrophils Relative %: 61.4 % (ref 43.0–77.0)
Platelets: 192 10*3/uL (ref 150.0–400.0)
RBC: 5.1 Mil/uL (ref 4.22–5.81)
RDW: 13.4 % (ref 11.5–15.5)
WBC: 5.9 10*3/uL (ref 4.0–10.5)

## 2014-10-01 LAB — LIPID PANEL
Cholesterol: 113 mg/dL (ref 0–200)
HDL: 49.4 mg/dL (ref >39.00)
LDL Cholesterol: 46 mg/dL (ref 0–99)
NonHDL: 63.6
TRIGLYCERIDES: 86 mg/dL (ref 0.0–149.0)
Total CHOL/HDL Ratio: 2
VLDL: 17.2 mg/dL (ref 0.0–40.0)

## 2014-10-02 ENCOUNTER — Encounter: Payer: Self-pay | Admitting: General Practice

## 2014-10-09 ENCOUNTER — Ambulatory Visit: Payer: Medicare Other | Admitting: Cardiology

## 2014-10-31 ENCOUNTER — Ambulatory Visit (INDEPENDENT_AMBULATORY_CARE_PROVIDER_SITE_OTHER): Payer: Medicare Other | Admitting: Cardiology

## 2014-10-31 ENCOUNTER — Encounter: Payer: Self-pay | Admitting: Cardiology

## 2014-10-31 VITALS — BP 146/68 | HR 76 | Ht 66.0 in | Wt 157.0 lb

## 2014-10-31 DIAGNOSIS — R011 Cardiac murmur, unspecified: Secondary | ICD-10-CM

## 2014-10-31 DIAGNOSIS — I2510 Atherosclerotic heart disease of native coronary artery without angina pectoris: Secondary | ICD-10-CM

## 2014-10-31 DIAGNOSIS — E785 Hyperlipidemia, unspecified: Secondary | ICD-10-CM

## 2014-10-31 DIAGNOSIS — I10 Essential (primary) hypertension: Secondary | ICD-10-CM

## 2014-10-31 NOTE — Patient Instructions (Addendum)
Your physician recommends that you continue on your current medications as directed. Please refer to the Current Medication list given to you today.  Your physician has requested that you have an echocardiogram. Echocardiography is a painless test that uses sound waves to create images of your heart. It provides your doctor with information about the size and shape of your heart and how well your heart's chambers and valves are working. This procedure takes approximately one hour. There are no restrictions for this procedure.  Your physician wants you to follow-up in: 1 year with Dr. Turner.  You will receive a reminder letter in the mail two months in advance. If you don't receive a letter, please call our office to schedule the follow-up appointment.  

## 2014-10-31 NOTE — Progress Notes (Signed)
[address] [place], [place]  [address] Phone: [phone] Fax:  [phone]  Date:  10/31/2014   ID:  [name] Pickup, DOB [birth year], MRN [id number]  PCP:  Neena RhymesKatherine Tabori, MD  Cardiologist:  Armanda Magicraci Turner, MD    History of Present Illness: [name] Toole is a 80 y.o. male with a history of ASCAD, HTN and dyslipidemia who presents today for followup. He is doing well. He denies any chest pain, SOB, DOE, LE edema, dizziness, palpitations or syncope.    Wt Readings from Last 3 Encounters:  10/31/14 157 lb (71.215 kg)  09/29/14 154 lb 2 oz (69.911 kg)  09/13/14 154 lb (69.854 kg)     Past Medical History  Diagnosis Date  . Hyperlipidemia   . Hypertension   . Peripheral vascular disease   . Vertigo   . Cognitive impairment, mild, so stated   . Colitis   . Vertebrobasilar artery syndrome   . Arthritis   . Elevated cholesterol   . Carotid artery occlusion   . DVT (deep venous thrombosis)   . CAD (coronary artery disease) 2005    s/p PCI of RCA  . Erectile dysfunction   . GERD (gastroesophageal reflux disease)     Current Outpatient Prescriptions  Medication Sig Dispense Refill  . aspirin 325 MG tablet Take 325 mg by mouth at bedtime.    Marland Kitchen. atorvastatin (LIPITOR) 40 MG tablet 1 TAB BY MOUTH DAILY 90 tablet 0  . carvedilol (COREG) 6.25 MG tablet Take 1 tablet (6.25 mg total) by mouth 2 (two) times daily. 180 tablet 2  . donepezil (ARICEPT) 10 MG tablet Take 10 mg by mouth daily.      Marland Kitchen. ezetimibe (ZETIA) 10 MG tablet Take 1 tablet (10 mg total) by mouth daily. 90 tablet 3  . fish oil-omega-3 fatty acids 1000 MG capsule Take 1 g by mouth 2 (two) times daily.    . hydrochlorothiazide (HYDRODIURIL) 25 MG tablet Take 1 tablet (25 mg total) by mouth daily. 90 tablet 2   No current facility-administered medications for this visit.    Allergies:   No Known Allergies  Social History:  The patient  reports that he quit smoking about 53 years ago. He has never used  smokeless tobacco. He reports that he drinks alcohol. He reports that he does not use illicit drugs.   Family History:  The patient's family history includes Deep vein thrombosis in his father and mother; Hyperlipidemia in his father and mother; Hypertension in his father and mother; Stroke in his father and mother.   ROS:  Please see the history of present illness.      All other systems reviewed and negative.   PHYSICAL EXAM: VS:  BP 146/68 mmHg  Pulse 76  Ht 5\' 6"  (1.676 m)  Wt 157 lb (71.215 kg)  BMI 25.35 kg/m2 Well nourished, well developed, in no acute distress HEENT: normal Neck: no JVD Cardiac:  normal S1, S2; RRR; 2/6 SM at RUSB to LLSB Lungs:  clear to auscultation bilaterally, no wheezing, rhonchi or rales Abd: soft, nontender, no hepatomegaly Ext: no edema Skin: warm and dry Neuro:  CNs 2-12 intact, no focal abnormalities noted  EKG:  NSR with no ST changes      ASSESSMENT AND PLAN:  1. ASCAD with no angina - continue ASA but decrease to 81mg  daily 2. HTN - borderline controlled.   - continue carvedilol/HCTZ 3. Dyslipidemia - LDL at goal - continue Zetia/fish oil and Lipitor  4.  Heart murmur - will get 2D echo to evaluate  Followup with me in 1 year   Signed, Armanda Magic, MD [facility] 10/31/2014 3:06 PM

## 2014-11-02 ENCOUNTER — Ambulatory Visit (HOSPITAL_COMMUNITY): Payer: Medicare Other | Attending: Cardiology

## 2014-11-02 DIAGNOSIS — R011 Cardiac murmur, unspecified: Secondary | ICD-10-CM

## 2014-11-02 DIAGNOSIS — E785 Hyperlipidemia, unspecified: Secondary | ICD-10-CM | POA: Insufficient documentation

## 2014-11-02 DIAGNOSIS — Z87891 Personal history of nicotine dependence: Secondary | ICD-10-CM | POA: Diagnosis not present

## 2014-11-02 DIAGNOSIS — K529 Noninfective gastroenteritis and colitis, unspecified: Secondary | ICD-10-CM | POA: Insufficient documentation

## 2014-11-02 DIAGNOSIS — R42 Dizziness and giddiness: Secondary | ICD-10-CM | POA: Diagnosis not present

## 2014-11-02 DIAGNOSIS — I071 Rheumatic tricuspid insufficiency: Secondary | ICD-10-CM | POA: Diagnosis not present

## 2014-11-02 DIAGNOSIS — I10 Essential (primary) hypertension: Secondary | ICD-10-CM | POA: Diagnosis not present

## 2014-11-02 DIAGNOSIS — I2510 Atherosclerotic heart disease of native coronary artery without angina pectoris: Secondary | ICD-10-CM | POA: Insufficient documentation

## 2014-11-02 DIAGNOSIS — R05 Cough: Secondary | ICD-10-CM | POA: Insufficient documentation

## 2014-11-02 NOTE — Progress Notes (Signed)
2D Echo completed. 11/02/2014 

## 2014-11-06 ENCOUNTER — Other Ambulatory Visit: Payer: Self-pay

## 2014-11-06 MED ORDER — CARVEDILOL 6.25 MG PO TABS: 6.2500 mg | ORAL_TABLET | Freq: Two times a day (BID) | ORAL | Status: DC

## 2014-11-10 ENCOUNTER — Encounter: Payer: Self-pay | Admitting: Family Medicine

## 2014-11-10 ENCOUNTER — Ambulatory Visit (INDEPENDENT_AMBULATORY_CARE_PROVIDER_SITE_OTHER): Payer: Medicare Other | Admitting: Family Medicine

## 2014-11-10 VITALS — BP 134/84 | HR 87 | Temp 98.4°F | Resp 16 | Wt 154.0 lb

## 2014-11-10 DIAGNOSIS — J069 Acute upper respiratory infection, unspecified: Secondary | ICD-10-CM

## 2014-11-10 MED ORDER — BENZONATATE 200 MG PO CAPS: 200.0000 mg | ORAL_CAPSULE | Freq: Three times a day (TID) | ORAL | Status: DC | PRN

## 2014-11-10 MED ORDER — GUAIFENESIN-CODEINE 100-10 MG/5ML PO SYRP: 10.0000 mL | ORAL_SOLUTION | Freq: Three times a day (TID) | ORAL | Status: DC | PRN

## 2014-11-10 NOTE — Progress Notes (Signed)
Pre visit review using our clinic review tool, if applicable. No additional management support is needed unless otherwise documented below in the visit note. 

## 2014-11-10 NOTE — Progress Notes (Signed)
   Subjective:    Patient ID: [name], male    DOB: 1935-10-08, 80 y.o.   MRN: [id number]  HPI Cough- worse at night, 'i cough all night'.  Chest is now sore from coughing.  sxs started 4-5 days ago.  Taking OTC Mucinex DM, Tussin CF, Tussin DM w/o relief.  No fevers.  No sinus pain/pressure.  Taking Zyrtec daily.  No ear pain.  No known sick contacts.  No hx of asthma or COPD.   Review of Systems For ROS see HPI     Objective:   Physical Exam  Constitutional: He appears well-developed and well-nourished. No distress.  HENT:  Head: Normocephalic and atraumatic.  Right Ear: Tympanic membrane normal.  Left Ear: Tympanic membrane normal.  Nose: No mucosal edema or rhinorrhea. Right sinus exhibits no maxillary sinus tenderness and no frontal sinus tenderness. Left sinus exhibits no maxillary sinus tenderness and no frontal sinus tenderness.  Mouth/Throat: Mucous membranes are normal. No oropharyngeal exudate, posterior oropharyngeal edema or posterior oropharyngeal erythema.  Eyes: Conjunctivae and EOM are normal. Pupils are equal, round, and reactive to light.  Neck: Normal range of motion. Neck supple.  Cardiovascular: Normal rate, regular rhythm and normal heart sounds.   Pulmonary/Chest: Effort normal and breath sounds normal. No respiratory distress. He has no wheezes.  + hacking cough  Lymphadenopathy:    He has no cervical adenopathy.  Skin: Skin is warm and dry.  Vitals reviewed.         Assessment & Plan:

## 2014-11-10 NOTE — Patient Instructions (Signed)
Follow up as needed This appears to be a viral/allergy combo Drink plenty of fluids Continue the Zyrtec daily Use the Tessalon in combination w/ the Mucinex DM for daytime cough Use the Cheratussin for nights/weekends REST! Call with any questions or concerns Have a great time at the game!!!  Keep Pounding!

## 2014-11-10 NOTE — Assessment & Plan Note (Signed)
Pt's sxs and PE consistent w/ viral/allergy combo.  He shows no signs of bacterial infxn and hx has no red flags.  Start cough meds for symptomatic relief and reviewed supportive care.  Pt expressed understanding and is in agreement w/ plan.

## 2014-12-08 ENCOUNTER — Other Ambulatory Visit: Payer: Self-pay | Admitting: Family Medicine

## 2014-12-08 NOTE — Telephone Encounter (Signed)
Med filled.  

## 2015-01-12 ENCOUNTER — Telehealth: Payer: Self-pay | Admitting: General Practice

## 2015-01-12 NOTE — Telephone Encounter (Signed)
Ok for [employer] at nurse visit

## 2015-01-12 NOTE — Telephone Encounter (Signed)
Pt wife wanted to make sure it was ok for pt to receive prevnar 13 vaccination? Pneumovax was given in 2003.

## 2015-01-24 ENCOUNTER — Telehealth: Payer: Self-pay | Admitting: Family Medicine

## 2015-01-24 NOTE — Telephone Encounter (Signed)
Caller name: Pere[place]ine Relation to pt: self Call back number: [phone] Pharmacy:  Reason for call:   Patient is wanting to get the pneumonia injection? Please advise.

## 2015-01-24 NOTE — Telephone Encounter (Signed)
Appointment scheduled for tomorrow. Please enter order. Thanks!

## 2015-01-24 NOTE — Telephone Encounter (Signed)
Ok for [employer] 13 ok per provider for nurse visit.

## 2015-01-25 ENCOUNTER — Ambulatory Visit (INDEPENDENT_AMBULATORY_CARE_PROVIDER_SITE_OTHER): Payer: Medicare Other | Admitting: *Deleted

## 2015-01-25 DIAGNOSIS — Z23 Encounter for immunization: Secondary | ICD-10-CM

## 2015-01-25 NOTE — Progress Notes (Signed)
Pre visit review using our clinic review tool, if applicable. No additional management support is needed unless otherwise documented below in the visit note.  Patient tolerated injection well.  

## 2015-03-05 ENCOUNTER — Telehealth: Payer: Self-pay | Admitting: Cardiology

## 2015-03-05 ENCOUNTER — Telehealth: Payer: Self-pay

## 2015-03-05 DIAGNOSIS — E785 Hyperlipidemia, unspecified: Secondary | ICD-10-CM

## 2015-03-05 NOTE — Telephone Encounter (Signed)
Walk in pt form- pt left Note[facility]

## 2015-03-05 NOTE — Telephone Encounter (Signed)
He is already on a statin.  Please find out if he is willing to increase Lipitor to 80mg  daily

## 2015-03-05 NOTE — Telephone Encounter (Signed)
Received walk-in form from patient about changing Zetia.  Patient st he talked to his pharmacy and Zetia is too expensive. He can no longer afford it. He is requesting a change to Lovastatin or Pravastatin as therapeutic alternatives that are less expensive.   To Dr. Mayford Knifeurner.

## 2015-03-06 MED ORDER — ATORVASTATIN CALCIUM 80 MG PO TABS: 80.0000 mg | ORAL_TABLET | Freq: Every day | ORAL | Status: DC

## 2015-03-06 NOTE — Telephone Encounter (Signed)
Instructed patient to INCREASE LIPITOR to 80 mg daily and to STOP ZETIA. FLP and ALT scheduled for July 12. Patient agrees with treatment plan.

## 2015-03-06 NOTE — Telephone Encounter (Signed)
Left message to call back  

## 2015-03-12 ENCOUNTER — Telehealth: Payer: Self-pay | Admitting: Family Medicine

## 2015-03-12 NOTE — Telephone Encounter (Signed)
Pre Visit letter sent  °

## 2015-03-22 ENCOUNTER — Encounter: Payer: Self-pay | Admitting: *Deleted

## 2015-03-22 ENCOUNTER — Telehealth: Payer: Self-pay | Admitting: *Deleted

## 2015-03-22 NOTE — Telephone Encounter (Signed)
Pre-Visit Call completed with patient and chart updated.   Pre-Visit Info documented in Specialty Comments under [employer].    

## 2015-03-23 ENCOUNTER — Encounter: Payer: Self-pay | Admitting: Family Medicine

## 2015-03-23 ENCOUNTER — Ambulatory Visit (INDEPENDENT_AMBULATORY_CARE_PROVIDER_SITE_OTHER): Payer: Medicare Other | Admitting: Family Medicine

## 2015-03-23 VITALS — BP 132/82 | HR 76 | Temp 98.0°F | Resp 16 | Ht 66.0 in | Wt 153.2 lb

## 2015-03-23 DIAGNOSIS — Z0000 Encounter for general adult medical examination without abnormal findings: Secondary | ICD-10-CM

## 2015-03-23 DIAGNOSIS — E785 Hyperlipidemia, unspecified: Secondary | ICD-10-CM | POA: Diagnosis not present

## 2015-03-23 DIAGNOSIS — Z125 Encounter for screening for malignant neoplasm of prostate: Secondary | ICD-10-CM

## 2015-03-23 DIAGNOSIS — I2510 Atherosclerotic heart disease of native coronary artery without angina pectoris: Secondary | ICD-10-CM | POA: Diagnosis not present

## 2015-03-23 DIAGNOSIS — G3184 Mild cognitive impairment, so stated: Secondary | ICD-10-CM | POA: Diagnosis not present

## 2015-03-23 DIAGNOSIS — I10 Essential (primary) hypertension: Secondary | ICD-10-CM | POA: Diagnosis not present

## 2015-03-23 DIAGNOSIS — I6529 Occlusion and stenosis of unspecified carotid artery: Secondary | ICD-10-CM

## 2015-03-23 LAB — CBC WITH DIFFERENTIAL/PLATELET
BASOS ABS: 0 10*3/uL (ref 0.0–0.1)
BASOS PCT: 0.6 % (ref 0.0–3.0)
EOS PCT: 1 % (ref 0.0–5.0)
Eosinophils Absolute: 0.1 10*3/uL (ref 0.0–0.7)
HCT: 44.3 % (ref 39.0–52.0)
Hemoglobin: 14.9 g/dL (ref 13.0–17.0)
LYMPHS ABS: 1.7 10*3/uL (ref 0.7–4.0)
LYMPHS PCT: 32.1 % (ref 12.0–46.0)
MCHC: 33.5 g/dL (ref 30.0–36.0)
MCV: 89.3 fl (ref 78.0–100.0)
MONO ABS: 0.5 10*3/uL (ref 0.1–1.0)
MONOS PCT: 9.9 % (ref 3.0–12.0)
NEUTROS PCT: 56.4 % (ref 43.0–77.0)
Neutro Abs: 3 10*3/uL (ref 1.4–7.7)
PLATELETS: 177 10*3/uL (ref 150.0–400.0)
RBC: 4.96 Mil/uL (ref 4.22–5.81)
RDW: 13.4 % (ref 11.5–15.5)
WBC: 5.3 10*3/uL (ref 4.0–10.5)

## 2015-03-23 LAB — TSH: TSH: 2.56 u[IU]/mL (ref 0.35–4.50)

## 2015-03-23 LAB — HEPATIC FUNCTION PANEL
ALT: 37 U/L (ref 0–53)
AST: 37 U/L (ref 0–37)
Albumin: 4.6 g/dL (ref 3.5–5.2)
Alkaline Phosphatase: 70 U/L (ref 39–117)
BILIRUBIN TOTAL: 0.9 mg/dL (ref 0.2–1.2)
Bilirubin, Direct: 0.1 mg/dL (ref 0.0–0.3)
Total Protein: 7.3 g/dL (ref 6.0–8.3)

## 2015-03-23 LAB — BASIC METABOLIC PANEL
BUN: 17 mg/dL (ref 6–23)
CALCIUM: 9.4 mg/dL (ref 8.4–10.5)
CO2: 31 meq/L (ref 19–32)
Chloride: 99 mEq/L (ref 96–112)
Creatinine, Ser: 1.01 mg/dL (ref 0.40–1.50)
GFR: 75.97 mL/min (ref >60.00)
Glucose, Bld: 91 mg/dL (ref 70–99)
POTASSIUM: 3.7 meq/L (ref 3.5–5.1)
SODIUM: 138 meq/L (ref 135–145)

## 2015-03-23 LAB — LIPID PANEL
Cholesterol: 140 mg/dL (ref 0–200)
HDL: 44.3 mg/dL (ref >39.00)
LDL Cholesterol: 64 mg/dL (ref 0–99)
NONHDL: 95.7
Total CHOL/HDL Ratio: 3
Triglycerides: 159 mg/dL — ABNORMAL HIGH (ref 0.0–149.0)
VLDL: 31.8 mg/dL (ref 0.0–40.0)

## 2015-03-23 LAB — PSA: PSA: 5.15 ng/mL — ABNORMAL HIGH (ref 0.10–4.00)

## 2015-03-23 NOTE — Assessment & Plan Note (Signed)
Chronic problem.  Following w/ Dr Mayford Knifeurner (Cards).  Currently asymptomatic.  On beta blocker, ASA, statin.  Will follow along.

## 2015-03-23 NOTE — Assessment & Plan Note (Signed)
Chronic problem.  Stable.  Following w/ Dr Vela ProseLewitt.  On Aricept daily to prevent additional losses.

## 2015-03-23 NOTE — Assessment & Plan Note (Signed)
Chronic problem.  Currently asymptomatic.  Following w/ Dr Edilia Boickson.  UTD on imaging.  On ASA daily and statin.  Will follow along.

## 2015-03-23 NOTE — Progress Notes (Signed)
   Subjective:    Patient ID: [name], male    DOB: [birth year], 80 y.o.   MRN: [id number]  HPI Here today for CPE.  Risk Factors: CAD- chronic problem, following w/ Dr Mayford Knifeurner.  On beta blocker and ASA daily.  Denies CP, SOB. HTN- chronic problem, adequate control on HCTZ, Coreg.  Denies CP, SOB, HAs, visual changes, edema. Hyperlipidemia- chronic problem, on Lipitor.  Cards increased to 80mg  daily.  Denies abd pain, N/V, myalgias. Carotid artery stenosis- UTD on carotid artery dopplers (Nov 15), Following w/ Dr Edilia Boickson Physical Activity: exercising regularly Fall Risk: low risk Depression: no current sxs Hearing: normal to conversational tones, mildly decreased to whispered voice ADL's: independent Cognitive: mild cognitive impairment, on Aricept from Dr Vela ProseLewitt (Neuro) Home Safety: safe at home, lives w/ wife Height, Weight, BMI, Visual Acuity: see vitals, vision corrected to 20/20 w/ glasses Counseling: UTD on immunizations, UTD on colonoscopy (2008[facility] [place]) Labs Ordered: See A&P Care Plan: See A&P    Review of Systems Patient reports no vision/hearing changes, anorexia, fever ,adenopathy, persistant/recurrent hoarseness, swallowing issues, chest pain, palpitations, edema, persistant/recurrent cough, hemoptysis, dyspnea (rest,exertional, paroxysmal nocturnal), gastrointestinal  bleeding (melena, rectal bleeding), abdominal pain, excessive heart burn, GU symptoms (dysuria, hematuria, voiding/incontinence issues) syncope, focal weakness, memory loss, numbness & tingling, skin/hair/nail changes, depression, anxiety, abnormal bruising/bleeding, musculoskeletal symptoms/signs.     Objective:   Physical Exam BP 132/82 mmHg  Pulse 76  Temp(Src) 98 F (36.7 C) (Oral)  Resp 16  Ht 5\' 6"  (1.676 m)  Wt 153 lb 4 oz (69.514 kg)  BMI 24.75 kg/m2  SpO2 96%  General Appearance:    Alert, cooperative, no distress, appears stated age  Head:    Normocephalic, without obvious abnormality,  atraumatic  Eyes:    PERRL, conjunctiva/corneas clear, EOM's intact, fundi    benign, both eyes       Ears:    Normal TM's and external ear canals, both ears  Nose:   Nares normal, septum midline, mucosa normal, no drainage   or sinus tenderness  Throat:   Lips, mucosa, and tongue normal; teeth and gums normal  Neck:   Supple, symmetrical, trachea midline, no adenopathy;       thyroid:  No enlargement/tenderness/nodules  Back:     Symmetric, no curvature, ROM normal, no CVA tenderness  Lungs:     Clear to auscultation bilaterally, respirations unlabored  Chest wall:    No tenderness or deformity  Heart:    Regular rate and rhythm, S1 and S2 normal, no murmur, rub   or gallop  Abdomen:     Soft, non-tender, bowel sounds active all four quadrants,    no masses, no organomegaly  Genitalia:    Normal male without lesion, discharge or tenderness  Rectal:    Normal tone, normal prostate, no masses or tenderness  Extremities:   Extremities normal, atraumatic, no cyanosis or edema  Pulses:   2+ and symmetric all extremities  Skin:   Skin color, texture, turgor normal, no rashes or lesions  Lymph nodes:   Cervical, supraclavicular, and axillary nodes normal  Neurologic:   CNII-XII intact. Normal strength, sensation and reflexes      throughout          Assessment & Plan:

## 2015-03-23 NOTE — Progress Notes (Signed)
Pre visit review using our clinic review tool, if applicable. No additional management support is needed unless otherwise documented below in the visit note. 

## 2015-03-23 NOTE — Assessment & Plan Note (Signed)
Chronic problem.  Adequate control.  Asymptomatic.  Check labs.  No anticipated med changes 

## 2015-03-23 NOTE — Assessment & Plan Note (Signed)
Pt's PE WNL.  UTD on colonoscopy- due 2018.  UTD on vaccines.  Care team updated.  Check labs.  Anticipatory guidance provided.

## 2015-03-23 NOTE — Patient Instructions (Signed)
Follow up in 6 months to recheck BP and cholesterol We'll notify you of your lab results and make any changes if needed You are up to date on all your vaccines and are not due for colonoscopy until 2018 Keep up the good work!  You look great! Call with any questions or concerns Have a great time at the beach!!!

## 2015-03-23 NOTE — Assessment & Plan Note (Signed)
Chronic problem.  Tolerating statin w/o difficulty.  Check labs.  Adjust meds prn  

## 2015-03-27 ENCOUNTER — Other Ambulatory Visit: Payer: Self-pay | Admitting: Family Medicine

## 2015-03-27 DIAGNOSIS — R972 Elevated prostate specific antigen [PSA]: Secondary | ICD-10-CM

## 2015-04-02 ENCOUNTER — Encounter: Payer: Medicare Other | Admitting: Family Medicine

## 2015-04-06 ENCOUNTER — Other Ambulatory Visit (INDEPENDENT_AMBULATORY_CARE_PROVIDER_SITE_OTHER): Payer: Medicare Other

## 2015-04-06 ENCOUNTER — Other Ambulatory Visit: Payer: Self-pay | Admitting: Family Medicine

## 2015-04-06 DIAGNOSIS — R972 Elevated prostate specific antigen [PSA]: Secondary | ICD-10-CM

## 2015-04-06 LAB — PSA: PSA: 4.62 ng/mL — ABNORMAL HIGH (ref 0.10–4.00)

## 2015-04-10 ENCOUNTER — Encounter: Payer: Self-pay | Admitting: Family Medicine

## 2015-04-27 ENCOUNTER — Other Ambulatory Visit: Payer: Self-pay

## 2015-04-27 MED ORDER — HYDROCHLOROTHIAZIDE 25 MG PO TABS: 25.0000 mg | ORAL_TABLET | Freq: Every day | ORAL | Status: DC

## 2015-05-03 ENCOUNTER — Telehealth: Payer: Self-pay

## 2015-05-03 NOTE — Telephone Encounter (Signed)
Patient presents to the office for explanation concerning Urology referral. Reviewed lab results with patient over the last year. Explained the PSA and why we would refer. Patient voices understanding and agrees with plan.

## 2015-05-08 ENCOUNTER — Other Ambulatory Visit (INDEPENDENT_AMBULATORY_CARE_PROVIDER_SITE_OTHER): Payer: Medicare Other | Admitting: *Deleted

## 2015-05-08 DIAGNOSIS — E785 Hyperlipidemia, unspecified: Secondary | ICD-10-CM

## 2015-05-08 LAB — LIPID PANEL
CHOL/HDL RATIO: 3
Cholesterol: 129 mg/dL (ref 0–200)
HDL: 41 mg/dL (ref >39.00)
LDL CALC: 60 mg/dL (ref 0–99)
NONHDL: 88
TRIGLYCERIDES: 139 mg/dL (ref 0.0–149.0)
VLDL: 27.8 mg/dL (ref 0.0–40.0)

## 2015-05-08 LAB — ALT: ALT: 36 U/L (ref 0–53)

## 2015-06-13 ENCOUNTER — Encounter: Payer: Self-pay | Admitting: Family Medicine

## 2015-06-13 ENCOUNTER — Ambulatory Visit (INDEPENDENT_AMBULATORY_CARE_PROVIDER_SITE_OTHER): Payer: Medicare Other | Admitting: Family Medicine

## 2015-06-13 VITALS — BP 136/86 | HR 90 | Temp 98.1°F | Resp 16 | Wt 156.1 lb

## 2015-06-13 DIAGNOSIS — M5431 Sciatica, right side: Secondary | ICD-10-CM

## 2015-06-13 MED ORDER — PREDNISONE 10 MG PO TABS
ORAL_TABLET | ORAL | Status: DC
Start: 2015-06-13 — End: 2015-09-10

## 2015-06-13 NOTE — Progress Notes (Signed)
   Subjective:    Patient ID: [name], male    DOB: [birth year], 80 y.o.   MRN: [id number]  HPI Hip Pain- pt localizes pain to R sciatic notch.  Pain will radiate down R leg.  Pain is intermittent.  Pain improves w/ leaning forward and shuffling.  Pain worse w/ standing upright.  sxs started ~1 month ago.  Pt denies heavy lifting or change in activity level.  No weakness or numbness of R leg.  Pain is described as 'lightning like burning'  'it has a strong hurt to it'.  Pain is worse after prolonged sitting.   Review of Systems For ROS see HPI     Objective:   Physical Exam  Constitutional: He appears well-developed and well-nourished. No distress.  HENT:  Head: Normocephalic and atraumatic.  Cardiovascular: Intact distal pulses.   Musculoskeletal: He exhibits no edema or tenderness (no TTP over sciatic notch today but pt reports this is where it hurts).  Neurological: He has normal reflexes. No cranial nerve deficit. Coordination normal.  (-) SLR  Skin: Skin is warm and dry.  Psychiatric: He has a normal mood and affect. His behavior is normal. Thought content normal.  Vitals reviewed.         Assessment & Plan:

## 2015-06-13 NOTE — Patient Instructions (Signed)
Follow up as needed Start the Prednisone as directed.  Take w/ food. Heating pad as needed for pain/stiffness Call with any questions or concerns Happy Bel[employer] Birthday!!!

## 2015-06-13 NOTE — Progress Notes (Signed)
Pre visit review using our clinic review tool, if applicable. No additional management support is needed unless otherwise documented below in the visit note. 

## 2015-06-17 NOTE — Assessment & Plan Note (Signed)
New.  Pt's pain- although not present on PE today- is consistent w/ sciatica.  Start pred taper.  If no improvement, will need referral to ortho for complete evaluation and tx.  Reviewed supportive care and red flags that should prompt return.  Pt expressed understanding and is in agreement w/ plan.

## 2015-07-11 ENCOUNTER — Ambulatory Visit: Payer: Medicare Other

## 2015-07-11 DIAGNOSIS — Z23 Encounter for immunization: Secondary | ICD-10-CM

## 2015-07-11 MED ORDER — INFLUENZA VAC SPLIT QUAD 0.5 ML IM SUSY
0.5000 mL | PREFILLED_SYRINGE | Freq: Once | INTRAMUSCULAR | Status: AC
  Administered 2015-07-11: 0.5 mL via INTRAMUSCULAR

## 2015-08-10 ENCOUNTER — Other Ambulatory Visit: Payer: Self-pay

## 2015-08-10 MED ORDER — CARVEDILOL 6.25 MG PO TABS: 6.2500 mg | ORAL_TABLET | Freq: Two times a day (BID) | ORAL | Status: DC

## 2015-08-13 ENCOUNTER — Other Ambulatory Visit: Payer: Self-pay | Admitting: Cardiology

## 2015-08-13 MED ORDER — CARVEDILOL 6.25 MG PO TABS: 6.2500 mg | ORAL_TABLET | Freq: Two times a day (BID) | ORAL | Status: DC

## 2015-09-10 ENCOUNTER — Encounter: Payer: Self-pay | Admitting: Family Medicine

## 2015-09-10 ENCOUNTER — Encounter (INDEPENDENT_AMBULATORY_CARE_PROVIDER_SITE_OTHER): Payer: Self-pay

## 2015-09-10 ENCOUNTER — Ambulatory Visit (INDEPENDENT_AMBULATORY_CARE_PROVIDER_SITE_OTHER): Payer: Medicare Other | Admitting: Family Medicine

## 2015-09-10 VITALS — BP 130/78 | HR 76 | Temp 98.0°F | Resp 16 | Ht 66.0 in | Wt 157.0 lb

## 2015-09-10 DIAGNOSIS — E785 Hyperlipidemia, unspecified: Secondary | ICD-10-CM

## 2015-09-10 DIAGNOSIS — I10 Essential (primary) hypertension: Secondary | ICD-10-CM

## 2015-09-10 LAB — BASIC METABOLIC PANEL
BUN: 18 mg/dL (ref 6–23)
CHLORIDE: 104 meq/L (ref 96–112)
CO2: 32 mEq/L (ref 19–32)
CREATININE: 0.98 mg/dL (ref 0.40–1.50)
Calcium: 9.4 mg/dL (ref 8.4–10.5)
GFR: 78.57 mL/min (ref >60.00)
Glucose, Bld: 95 mg/dL (ref 70–99)
POTASSIUM: 4.7 meq/L (ref 3.5–5.1)
Sodium: 142 mEq/L (ref 135–145)

## 2015-09-10 LAB — HEPATIC FUNCTION PANEL
ALBUMIN: 4.2 g/dL (ref 3.5–5.2)
ALT: 38 U/L (ref 0–53)
AST: 35 U/L (ref 0–37)
Alkaline Phosphatase: 58 U/L (ref 39–117)
BILIRUBIN TOTAL: 0.7 mg/dL (ref 0.2–1.2)
Bilirubin, Direct: 0.1 mg/dL (ref 0.0–0.3)
Total Protein: 6.6 g/dL (ref 6.0–8.3)

## 2015-09-10 LAB — CBC WITH DIFFERENTIAL/PLATELET
BASOS PCT: 0.6 % (ref 0.0–3.0)
Basophils Absolute: 0 10*3/uL (ref 0.0–0.1)
EOS ABS: 0.1 10*3/uL (ref 0.0–0.7)
Eosinophils Relative: 1.4 % (ref 0.0–5.0)
HEMATOCRIT: 42.6 % (ref 39.0–52.0)
HEMOGLOBIN: 14.3 g/dL (ref 13.0–17.0)
Lymphocytes Relative: 29.7 % (ref 12.0–46.0)
Lymphs Abs: 1.5 10*3/uL (ref 0.7–4.0)
MCHC: 33.5 g/dL (ref 30.0–36.0)
MCV: 90.1 fl (ref 78.0–100.0)
Monocytes Absolute: 0.5 10*3/uL (ref 0.1–1.0)
Monocytes Relative: 10.6 % (ref 3.0–12.0)
Neutro Abs: 2.9 10*3/uL (ref 1.4–7.7)
Neutrophils Relative %: 57.7 % (ref 43.0–77.0)
Platelets: 184 10*3/uL (ref 150.0–400.0)
RBC: 4.73 Mil/uL (ref 4.22–5.81)
RDW: 13.5 % (ref 11.5–15.5)
WBC: 5.1 10*3/uL (ref 4.0–10.5)

## 2015-09-10 LAB — LIPID PANEL
Cholesterol: 123 mg/dL (ref 0–200)
HDL: 48.2 mg/dL (ref >39.00)
LDL Cholesterol: 58 mg/dL (ref 0–99)
NONHDL: 74.67
Total CHOL/HDL Ratio: 3
Triglycerides: 81 mg/dL (ref 0.0–149.0)
VLDL: 16.2 mg/dL (ref 0.0–40.0)

## 2015-09-10 NOTE — Progress Notes (Signed)
   Subjective:    Patient ID: [name], male    DOB: 1935/04/08, 80 y.o.   MRN: [id number]  HPI HTN- chronic problem, on Coreg and HCTZ daily.  No CP, SOB, HAs, visual changes, edema.  Hyperlipidemia- chronic problem, Lipitor and fish oil daily.  No abd pain, N/V, myalgias.  Exercising regularly.   Review of Systems For ROS see HPI     Objective:   Physical Exam  Constitutional: He is oriented to person, place, and time. He appears well-developed and well-nourished. No distress.  HENT:  Head: Normocephalic and atraumatic.  Eyes: Conjunctivae and EOM are normal. Pupils are equal, round, and reactive to light.  Neck: Normal range of motion. Neck supple. No thyromegaly present.  Cardiovascular: Normal rate, regular rhythm, normal heart sounds and intact distal pulses.   No murmur heard. Pulmonary/Chest: Effort normal and breath sounds normal. No respiratory distress.  Abdominal: Soft. Bowel sounds are normal. He exhibits no distension.  Musculoskeletal: He exhibits no edema.  Lymphadenopathy:    He has no cervical adenopathy.  Neurological: He is alert and oriented to person, place, and time. No cranial nerve deficit.  Skin: Skin is warm and dry.  Psychiatric: He has a normal mood and affect. His behavior is normal.  Vitals reviewed.         Assessment & Plan:

## 2015-09-10 NOTE — Assessment & Plan Note (Signed)
Chronic problem.  Well controlled.  Asymptomatic.  Check labs.  No anticipated med changes. 

## 2015-09-10 NOTE — Assessment & Plan Note (Signed)
Chronic problem.  Tolerating statin w/o difficulty.  Check labs.  Adjust meds prn  

## 2015-09-10 NOTE — Progress Notes (Signed)
Pre visit review using our clinic review tool, if applicable. No additional management support is needed unless otherwise documented below in the visit note. 

## 2015-09-10 NOTE — Patient Instructions (Signed)
Schedule your complete physical in 6 months We'll notify you of your lab results and make any changes if needed Keep up the good work on healthy diet and regular exercise- you look great! Call with any questions or concerns If you want to join us at the new [place] office, any scheduled appointments will automatically transfer and we will see you at [address], [place], [place] [address] (OPENING 10/30/15) Happy Holidays!!! 

## 2015-09-19 ENCOUNTER — Ambulatory Visit: Payer: Medicare Other | Admitting: Family

## 2015-09-19 ENCOUNTER — Other Ambulatory Visit (HOSPITAL_COMMUNITY): Payer: Medicare Other

## 2015-10-25 ENCOUNTER — Encounter: Payer: Self-pay | Admitting: Family

## 2015-11-02 ENCOUNTER — Ambulatory Visit (HOSPITAL_COMMUNITY)
Admission: RE | Admit: 2015-11-02 | Discharge: 2015-11-02 | Disposition: A | Discharge status: Home / Self Care | Payer: Medicare Other | Source: Ambulatory Visit | Attending: Family | Admitting: Family

## 2015-11-02 ENCOUNTER — Ambulatory Visit (INDEPENDENT_AMBULATORY_CARE_PROVIDER_SITE_OTHER): Payer: Medicare Other | Admitting: Family

## 2015-11-02 ENCOUNTER — Encounter: Payer: Self-pay | Admitting: Family

## 2015-11-02 VITALS — BP 125/70 | HR 64 | Temp 99.9°F | Resp 16 | Ht 66.0 in | Wt 154.0 lb

## 2015-11-02 DIAGNOSIS — I6523 Occlusion and stenosis of bilateral carotid arteries: Secondary | ICD-10-CM

## 2015-11-02 DIAGNOSIS — I10 Essential (primary) hypertension: Secondary | ICD-10-CM | POA: Insufficient documentation

## 2015-11-02 DIAGNOSIS — E785 Hyperlipidemia, unspecified: Secondary | ICD-10-CM | POA: Insufficient documentation

## 2015-11-02 NOTE — Progress Notes (Signed)
Chief Complaint: Extracranial Carotid Artery Stenosis   History of Present Illness  [name] is a 81 y.o. male patient that Dr. Edilia [name] has followed yearly for carotid artery surveillance since his cardiac stent placement and returns today for follow up.  Patient has not had previous carotid artery intervention. He saw Dr. Mayford [name] for chest pain, had cardiac stent placed in 2006 or 2007 and had no further chest pain, denies hx of MI.  He denies any history of TIA or stroke symptoms.Specifically the patient denies a history of amaurosis fugax or monocular blindness, unilateral facial drooping, hemiparesis, or receptive or expressive aphasia.   The patient denies claudication symptoms with walking.  Pt reports New Medical or Surgical History: sustained dog bites about 2 weeks ago and was treated at an urgent care center.  Pt Diabetic: No Pt smoker: non-smoker  Pt meds include: Statin : Yes Betablocker: Yes ASA: Yes Other anticoagulants/antiplatelets: no    Past Medical History  Diagnosis Date  . Hyperlipidemia   . Hypertension   . Peripheral vascular disease (HCC)   . Vertigo   . Cognitive impairment, mild, so stated   . Colitis   . Vertebrobasilar artery syndrome   . Arthritis   . Elevated cholesterol   . Carotid artery occlusion   . DVT (deep venous thrombosis) (HCC)   . CAD (coronary artery disease) 2005    s/p PCI of RCA  . Erectile dysfunction   . GERD (gastroesophageal reflux disease)     Social History Social History  Substance Use Topics  . Smoking status: Former Smoker    Quit date: 05/13/1961  . Smokeless tobacco: Never Used  . Alcohol Use: Yes     Comment: wine nightly    Family History Family History  Problem Relation Age of Onset  . Stroke Mother   . Hyperlipidemia Mother   . Hypertension Mother   . Deep vein thrombosis Mother   . Stroke Father   . Hyperlipidemia Father   . Hypertension Father   . Deep vein thrombosis Father      Surgical History Past Surgical History  Procedure Laterality Date  . Pr vein bypass graft,aorto-fem-pop  2007  . Appendectomy      Pt. was 81 yrs old    No Known Allergies  Current Outpatient Prescriptions  Medication Sig Dispense Refill  . aspirin 81 MG tablet Take 81 mg by mouth daily.    Marland Kitchen. atorvastatin (LIPITOR) 80 MG tablet Take 1 tablet (80 mg total) by mouth daily. 90 tablet 3  . carvedilol (COREG) 6.25 MG tablet Take 1 tablet (6.25 mg total) by mouth 2 (two) times daily. 180 tablet 2  . donepezil (ARICEPT) 10 MG tablet Take 10 mg by mouth daily.      . fish oil-omega-3 fatty acids 1000 MG capsule Take 1 g by mouth 2 (two) times daily.    . hydrochlorothiazide (HYDRODIURIL) 25 MG tablet Take 1 tablet (25 mg total) by mouth daily. 90 tablet 2   No current facility-administered medications for this visit.    Review of Systems : See HPI for pertinent positives and negatives.  Physical Examination  Filed Vitals:   11/02/15 1310 11/02/15 1312 11/02/15 1316  BP: 137/69 142/75 125/70  Pulse: 64 64 64  Temp: 99.9 F (37.7 C)    Resp: 16    Height: 5\' 6"  (1.676 m)    Weight: 154 lb (69.854 kg)    SpO2: 97%     Body mass index is  24.87 kg/(m^2).   General: WDWN male in NAD GAIT: normal Eyes: PERRLA Pulmonary: CTAB, no rales,  rhonchi, or wheezing.  Cardiac: regular rhythm, no detected murmur  VASCULAR EXAM Carotid Bruits Left Right   Negative Negative  Radial pulses are 2+ palpable and equal. Aorta is not palpable.      LE Pulses LEFT RIGHT   POPLITEAL not palpable   not palpable   POSTERIOR TIBIAL  palpable   palpable    DORSALIS PEDIS  ANTERIOR TIBIAL not palpable  palpable     Gastrointestinal: soft, nontender, BS WNL, no r/g, no palpated  masses.  Musculoskeletal: No muscle atrophy/wasting. M/S 5/5 throughout, Extremities without ischemic changes. No peripheral edema.  Neurologic: A&O X 3; Appropriate Affect ; SENSATION ;normal;  Speech is normal CN 2-12 intact, Pain and light touch intact in extremities, Motor exam as listed above.          Non-Invasive Vascular Imaging CAROTID DUPLEX 11/02/2015   CEREBROVASCULAR DUPLEX EVALUATION    INDICATION: Carotid artery disease     PREVIOUS INTERVENTION(S):     DUPLEX EXAM:     RIGHT  LEFT  Peak Systolic Velocities (cm/s) End Diastolic Velocities (cm/s) Plaque LOCATION Peak Systolic Velocities (cm/s) End Diastolic Velocities (cm/s) Plaque  100 14  CCA PROXIMAL 140 21   108 22 HT CCA MID 132 20 HT  82 16 HT CCA DISTAL 90 20 HM  110 6 HT ECA 108 6   98 20 HT ICA PROXIMAL 192 43 HT  124 31  ICA MID 202 54   103 23  ICA DISTAL 153 40     1.14 ICA / CCA Ratio (PSV) 1.53  Antegrade  Vertebral Flow Antegrade    Brachial Systolic Pressure (mmHg)   Multiphasic (Subclavian artery) Brachial Artery Waveforms Multiphasic (Subclavian artery)    Plaque Morphology:  HM = Homogeneous, HT = Heterogeneous, CP = Calcific Plaque, SP = Smooth Plaque, IP = Irregular Plaque     ADDITIONAL FINDINGS:     IMPRESSION: Right internal carotid artery velocities suggest a <40% stenosis.  Left internal carotid artery velocities suggest a 40-59% stenosis.     Compared to the previous exam:  Disease progression involving the left internal carotid artery when compared to the previous exam on 09/13/2014.      Assessment: [name] is a 81 y.o. male who has no history of stroke or TIA. Today's carotid duplex suggests <40% right ICA stenosis and  40-59% stenosis.  Disease progression involving the left internal carotid artery when compared to the previous exam on 09/13/2014.   Plan: Follow-up in 1 year with Carotid Duplex scan.   I discussed in depth with the patient the nature of  atherosclerosis, and emphasized the importance of maximal medical management including strict control of blood pressure, blood glucose, and lipid levels, obtaining regular exercise, and continued cessation of smoking.  The patient is aware that without maximal medical management the underlying atherosclerotic disease process will progress, limiting the benefit of any interventions. The patient was given information about stroke prevention and what symptoms should prompt the patient to seek immediate medical care. Thank you for allowing [place] to participate in this patient's care.  Charisse March, RN, MSN, FNP-C [facility] Office: [phone]  Clinic Physician: Imogene Burn  11/02/2015 1:06 PM

## 2015-11-02 NOTE — Progress Notes (Signed)
Filed Vitals:   11/02/15 1310 11/02/15 1312 11/02/15 1316  BP: 137/69 142/75 125/70  Pulse: 64 64 64  Temp: 99.9 F (37.7 C)    Resp: 16    Height: 5\' 6"  (1.676 m)    Weight: 154 lb (69.854 kg)    SpO2: 97%

## 2015-11-02 NOTE — Patient Instructions (Signed)
Stroke Prevention Some medical conditions and behaviors are associated with an increased chance of having a stroke. You may prevent a stroke by making healthy choices and managing medical conditions. HOW CAN I REDUCE MY RISK OF HAVING A STROKE?   Stay physically active. Get at least 30 minutes of activity on most or all days.  Do not smoke. It may also be helpful to avoid exposure to secondhand smoke.  Limit alcohol use. Moderate alcohol use is considered to be:  No more than 2 drinks per day for men.  No more than 1 drink per day for nonpregnant women.  Eat healthy foods. This involves:  Eating 5 or more servings of fruits and vegetables a day.  Making dietary changes that address high blood pressure (hypertension), high cholesterol, diabetes, or obesity.  Manage your cholesterol levels.  Making food choices that are high in fiber and low in saturated fat, trans fat, and cholesterol may control cholesterol levels.  Take any prescribed medicines to control cholesterol as directed by your health care provider.  Manage your diabetes.  Controlling your carbohydrate and sugar intake is recommended to manage diabetes.  Take any prescribed medicines to control diabetes as directed by your health care provider.  Control your hypertension.  Making food choices that are low in salt (sodium), saturated fat, trans fat, and cholesterol is recommended to manage hypertension.  Ask your health care provider if you need treatment to lower your blood pressure. Take any prescribed medicines to control hypertension as directed by your health care provider.  If you are 18-39 years of age, have your blood pressure checked every 3-5 years. If you are 40 years of age or older, have your blood pressure checked every year.  Maintain a healthy weight.  Reducing calorie intake and making food choices that are low in sodium, saturated fat, trans fat, and cholesterol are recommended to manage  weight.  Stop drug abuse.  Avoid taking birth control pills.  Talk to your health care provider about the risks of taking birth control pills if you are over 35 years old, smoke, get migraines, or have ever had a blood clot.  Get evaluated for sleep disorders (sleep apnea).  Talk to your health care provider about getting a sleep evaluation if you snore a lot or have excessive sleepiness.  Take medicines only as directed by your health care provider.  For some people, aspirin or blood thinners (anticoagulants) are helpful in reducing the risk of forming abnormal blood clots that can lead to stroke. If you have the irregular heart rhythm of atrial fibrillation, you should be on a blood thinner unless there is a good reason you cannot take them.  Understand all your medicine instructions.  Make sure that other conditions (such as anemia or atherosclerosis) are addressed. SEEK IMMEDIATE MEDICAL CARE IF:   You have sudden weakness or numbness of the face, arm, or leg, especially on one side of the body.  Your face or eyelid droops to one side.  You have sudden confusion.  You have trouble speaking (aphasia) or understanding.  You have sudden trouble seeing in one or both eyes.  You have sudden trouble walking.  You have dizziness.  You have a loss of balance or coordination.  You have a sudden, severe headache with no known cause.  You have new chest pain or an irregular heartbeat. Any of these symptoms may represent a serious problem that is an emergency. Do not wait to see if the symptoms will   go away. Get medical help at once. Call your local emergency services (911 in [place] Do not drive yourself to the hospital.   This information is not intended to replace advice given to you by your health care provider. Make sure you discuss any questions you have with your health care provider.   Document Released: 11/20/2004 Document Revised: 11/03/2014 Document Reviewed:  04/15/2013 Elsevier Interactive Patient Education 2016 Elsevier Inc.  

## 2015-12-05 ENCOUNTER — Telehealth: Payer: Self-pay | Admitting: Family Medicine

## 2015-12-05 NOTE — Telephone Encounter (Signed)
Caller name: Bonita Quin Relationship to patient: [phone] Can be reached: Pharmacy:  Reason for call: Request refill on  Medication   predniSONE (DELTASONE) 10 MG tablet [[id number]]       predniSONE (DELTASONE) 10 MG tablet [[id number]] DISCONTINUED     Order Details    Dose, Route, Frequency: As Directed    Dispense Quantity:  18 tablet Refills:  0 Fills Remaining:  0          Sig: 3 tabs x3 days and then 2 tabs x3 days and then 1 tab x3 days. Take w/ food.         Discontinue Date:  09/10/2015 4401 Discontinue User:  Jackson Latino, CMA Discontinue Reason:  --   Written Date:  06/13/15 Expiration Date:  06/12/16     Start Date:  06/13/15 End Date:  09/10/15     Ordering Provider:  -- Authorizing Provider:  Sheliah Hatch, MD Ordering User:  Sheliah Hatch, MD                    Pharmacy:  [facility] [phone] - [place], [place] - [address]      Pharmacy Comments:  --         Quantity Remaining:  0 tablet Quantity Filled:  0 tablet              Order Class     Normal     All Administrations of predniSONE (DELTASONE) 10 MG tablet     No Administrations Recorded              Warnings History     No Interaction Warnings Shown      Order History  Outpatient    Date/Time Action Taken User Additional Information    06/13/15 1429 Sign Sheliah Hatch, MD     09/10/15 0853 Discontinue Geannie Risen, CMA       This Order Has Been Discontinued     Order Status By On Reason    Discontinued Geannie Risen, CMA 09/10/15 0853 None      Medication Detail       Disp Refills Start End      predniSONE (DELTASONE) 10 MG tablet (Discontinued) 18 tablet 0 06/13/2015 09/10/2015     Sig: 3 tabs x3 days and then 2 tabs x3 days and then 1 tab x3 days. Take w/ food.     E-Prescribing Status: Receipt confirmed by pharmacy (06/13/2015 2:29 PM EDT)

## 2015-12-06 ENCOUNTER — Encounter: Payer: Self-pay | Admitting: Family Medicine

## 2015-12-06 ENCOUNTER — Ambulatory Visit (INDEPENDENT_AMBULATORY_CARE_PROVIDER_SITE_OTHER): Payer: Medicare Other | Admitting: Family Medicine

## 2015-12-06 VITALS — BP 150/70 | HR 64 | Temp 97.5°F | Ht 66.0 in | Wt 156.2 lb

## 2015-12-06 DIAGNOSIS — M5431 Sciatica, right side: Secondary | ICD-10-CM | POA: Diagnosis not present

## 2015-12-06 MED ORDER — PREDNISONE 10 MG PO TABS
ORAL_TABLET | ORAL | Status: DC
Start: 2015-12-06 — End: 2016-02-29

## 2015-12-06 NOTE — Progress Notes (Signed)
   Subjective:    Patient ID: [name], male    DOB: 1935-01-03, 81 y.o.   MRN: [id number]  HPI R sided sciatica- pt has hx of similar that responded well in the past to prednisone.  sxs returned 4-5 days ago.  Pain is R sided, radiates into upper thigh.  'there's a hitch'.  No pain w/ forward flexion.  + pain w/ extension.  No weakness, numbness, changes to bowel or bladder habits.   Review of Systems For ROS see HPI     Objective:   Physical Exam  Constitutional: He is oriented to person, place, and time. He appears well-developed and well-nourished. No distress.  HENT:  Head: Normocephalic and atraumatic.  Cardiovascular: Intact distal pulses.   Musculoskeletal: He exhibits tenderness. Edema: TTP over R sciatic notch.  Neurological: He is alert and oriented to person, place, and time. He has normal reflexes.  (-) SLR bilaterally  Skin: Skin is warm and dry.  Psychiatric: He has a normal mood and affect. His behavior is normal. Thought content normal.  Vitals reviewed.         Assessment & Plan:

## 2015-12-06 NOTE — Assessment & Plan Note (Signed)
Recurrent issue for pt.  (-) SLR bilaterally.  + TTP over R sciatic notch.  Restart Prednisone as this has worked in the past.  Reviewed supportive care and red flags that should prompt return.  Pt expressed understanding and is in agreement w/ plan.

## 2015-12-06 NOTE — Patient Instructions (Signed)
Follow up as needed Start the prednisone as directed- take w/ food Heating pad for pain relief Call with any questions or concerns If you want to join [place] at the new [place] office, any scheduled appointments will automatically transfer and we will see you at [address] [place] [address] [place], [place] [address] [facility]) Happy Valentine's Day!

## 2015-12-06 NOTE — Progress Notes (Signed)
Pre visit review using our clinic review tool, if applicable. No additional management support is needed unless otherwise documented below in the visit note. 

## 2015-12-18 DIAGNOSIS — G3184 Mild cognitive impairment, so stated: Secondary | ICD-10-CM | POA: Diagnosis not present

## 2015-12-18 DIAGNOSIS — M545 Low back pain: Secondary | ICD-10-CM | POA: Diagnosis not present

## 2015-12-18 DIAGNOSIS — R030 Elevated blood-pressure reading, without diagnosis of hypertension: Secondary | ICD-10-CM | POA: Diagnosis not present

## 2015-12-19 DIAGNOSIS — M545 Low back pain: Secondary | ICD-10-CM | POA: Diagnosis not present

## 2015-12-19 DIAGNOSIS — M62830 Muscle spasm of back: Secondary | ICD-10-CM | POA: Diagnosis not present

## 2015-12-19 DIAGNOSIS — M5431 Sciatica, right side: Secondary | ICD-10-CM | POA: Diagnosis not present

## 2015-12-19 DIAGNOSIS — M9903 Segmental and somatic dysfunction of lumbar region: Secondary | ICD-10-CM | POA: Diagnosis not present

## 2015-12-24 DIAGNOSIS — M545 Low back pain: Secondary | ICD-10-CM | POA: Diagnosis not present

## 2015-12-24 DIAGNOSIS — M62830 Muscle spasm of back: Secondary | ICD-10-CM | POA: Diagnosis not present

## 2015-12-24 DIAGNOSIS — M5431 Sciatica, right side: Secondary | ICD-10-CM | POA: Diagnosis not present

## 2015-12-24 DIAGNOSIS — M9903 Segmental and somatic dysfunction of lumbar region: Secondary | ICD-10-CM | POA: Diagnosis not present

## 2015-12-26 DIAGNOSIS — M62830 Muscle spasm of back: Secondary | ICD-10-CM | POA: Diagnosis not present

## 2015-12-26 DIAGNOSIS — M9903 Segmental and somatic dysfunction of lumbar region: Secondary | ICD-10-CM | POA: Diagnosis not present

## 2015-12-26 DIAGNOSIS — M5431 Sciatica, right side: Secondary | ICD-10-CM | POA: Diagnosis not present

## 2015-12-26 DIAGNOSIS — M545 Low back pain: Secondary | ICD-10-CM | POA: Diagnosis not present

## 2015-12-31 DIAGNOSIS — M9903 Segmental and somatic dysfunction of lumbar region: Secondary | ICD-10-CM | POA: Diagnosis not present

## 2015-12-31 DIAGNOSIS — M5431 Sciatica, right side: Secondary | ICD-10-CM | POA: Diagnosis not present

## 2015-12-31 DIAGNOSIS — M62830 Muscle spasm of back: Secondary | ICD-10-CM | POA: Diagnosis not present

## 2015-12-31 DIAGNOSIS — M545 Low back pain: Secondary | ICD-10-CM | POA: Diagnosis not present

## 2016-01-10 DIAGNOSIS — M62830 Muscle spasm of back: Secondary | ICD-10-CM | POA: Diagnosis not present

## 2016-01-10 DIAGNOSIS — M545 Low back pain: Secondary | ICD-10-CM | POA: Diagnosis not present

## 2016-01-10 DIAGNOSIS — M9903 Segmental and somatic dysfunction of lumbar region: Secondary | ICD-10-CM | POA: Diagnosis not present

## 2016-01-10 DIAGNOSIS — M5431 Sciatica, right side: Secondary | ICD-10-CM | POA: Diagnosis not present

## 2016-01-16 DIAGNOSIS — M62830 Muscle spasm of back: Secondary | ICD-10-CM | POA: Diagnosis not present

## 2016-01-16 DIAGNOSIS — M545 Low back pain: Secondary | ICD-10-CM | POA: Diagnosis not present

## 2016-01-16 DIAGNOSIS — M5431 Sciatica, right side: Secondary | ICD-10-CM | POA: Diagnosis not present

## 2016-01-16 DIAGNOSIS — M9903 Segmental and somatic dysfunction of lumbar region: Secondary | ICD-10-CM | POA: Diagnosis not present

## 2016-01-23 DIAGNOSIS — M545 Low back pain: Secondary | ICD-10-CM | POA: Diagnosis not present

## 2016-01-23 DIAGNOSIS — M5431 Sciatica, right side: Secondary | ICD-10-CM | POA: Diagnosis not present

## 2016-01-23 DIAGNOSIS — M9903 Segmental and somatic dysfunction of lumbar region: Secondary | ICD-10-CM | POA: Diagnosis not present

## 2016-01-23 DIAGNOSIS — M62830 Muscle spasm of back: Secondary | ICD-10-CM | POA: Diagnosis not present

## 2016-01-30 DIAGNOSIS — M62830 Muscle spasm of back: Secondary | ICD-10-CM | POA: Diagnosis not present

## 2016-01-30 DIAGNOSIS — M5431 Sciatica, right side: Secondary | ICD-10-CM | POA: Diagnosis not present

## 2016-01-30 DIAGNOSIS — M9903 Segmental and somatic dysfunction of lumbar region: Secondary | ICD-10-CM | POA: Diagnosis not present

## 2016-01-30 DIAGNOSIS — M545 Low back pain: Secondary | ICD-10-CM | POA: Diagnosis not present

## 2016-02-06 DIAGNOSIS — M9903 Segmental and somatic dysfunction of lumbar region: Secondary | ICD-10-CM | POA: Diagnosis not present

## 2016-02-06 DIAGNOSIS — M5431 Sciatica, right side: Secondary | ICD-10-CM | POA: Diagnosis not present

## 2016-02-06 DIAGNOSIS — M62830 Muscle spasm of back: Secondary | ICD-10-CM | POA: Diagnosis not present

## 2016-02-06 DIAGNOSIS — M545 Low back pain: Secondary | ICD-10-CM | POA: Diagnosis not present

## 2016-02-22 ENCOUNTER — Other Ambulatory Visit: Payer: Self-pay | Admitting: *Deleted

## 2016-02-22 ENCOUNTER — Telehealth: Payer: Self-pay | Admitting: Cardiology

## 2016-02-22 MED ORDER — HYDROCHLOROTHIAZIDE 25 MG PO TABS: 25.0000 mg | ORAL_TABLET | Freq: Every day | ORAL | Status: DC

## 2016-02-22 NOTE — Telephone Encounter (Signed)
Pt see's Dr. Mayford Knifeurner in [place]. Please review for refill.

## 2016-02-22 NOTE — Telephone Encounter (Signed)
°  New Prob    *STAT* If patient is at the pharmacy, call can be transferred to refill team.   1. Which medications need to be refilled? (please list name of each medication and dose if known) hydrochlorothiazide (HYDRODIURIL) 25 MG tablet   2. Which pharmacy/location (including street and city if local pharmacy) is medication to be sent to? [facility]  3. Do they need a 30 day or 90 day supply? 90 days   Appointment scheduled for June 5 with Dr. Mayford Knifeurner.

## 2016-02-29 ENCOUNTER — Encounter: Payer: Self-pay | Admitting: Family Medicine

## 2016-02-29 ENCOUNTER — Ambulatory Visit (INDEPENDENT_AMBULATORY_CARE_PROVIDER_SITE_OTHER): Payer: Medicare Other | Admitting: Family Medicine

## 2016-02-29 VITALS — BP 138/82 | HR 79 | Temp 98.1°F | Resp 16 | Ht 66.0 in | Wt 156.0 lb

## 2016-02-29 DIAGNOSIS — M5431 Sciatica, right side: Secondary | ICD-10-CM | POA: Diagnosis not present

## 2016-02-29 MED ORDER — PREDNISONE 10 MG PO TABS: ORAL_TABLET | ORAL | Status: DC

## 2016-02-29 NOTE — Assessment & Plan Note (Signed)
Recurrent problem for pt.  No longer resolving w/ prednisone.  Restart prednisone and refer to Ortho for ongoing management.  Pt expressed understanding and is in agreement w/ plan.

## 2016-02-29 NOTE — Patient Instructions (Signed)
Follow up as needed Start the Prednisone as directed- take w/ food Tylenol as needed for pain (no ibuprofen due to the prednisone) We'll call you with your ortho appt for the sciatica Call with any questions or concerns Hang in there!!!

## 2016-02-29 NOTE — Progress Notes (Signed)
   Subjective:    Patient ID: [name], male    DOB: 19-Nov-1934, 81 y.o.   MRN: [id number]  HPI Sciatica- pt last treated for this in Feb.  sxs have worsened over the last month.  Icing area nightly w/ mild relief.  No weakness or numbness of R leg.  No bowel or bladder incontinence.  Some relief w/ chiropractic tx's but this is only temporary.  Worse w/ leaning forward.  Painful to turn over at night but able to lie flat.   Review of Systems For ROS see HPI     Objective:   Physical Exam  Constitutional: He is oriented to person, place, and time. He appears well-developed and well-nourished. No distress.  HENT:  Head: Normocephalic and atraumatic.  Cardiovascular: Intact distal pulses.   Musculoskeletal: He exhibits tenderness (TTP over R sciatic notch). He exhibits no edema.  Neurological: He is alert and oriented to person, place, and time. He has normal reflexes. No cranial nerve deficit. Coordination normal.  (-) SLR bilaterally  Vitals reviewed.         Assessment & Plan:

## 2016-02-29 NOTE — Progress Notes (Signed)
Pre visit review using our clinic review tool, if applicable. No additional management support is needed unless otherwise documented below in the visit note. 

## 2016-03-03 ENCOUNTER — Encounter: Payer: Self-pay | Admitting: Family Medicine

## 2016-03-03 ENCOUNTER — Telehealth: Payer: Self-pay | Admitting: Family Medicine

## 2016-03-03 DIAGNOSIS — M5431 Sciatica, right side: Secondary | ICD-10-CM

## 2016-03-03 NOTE — Telephone Encounter (Signed)
Referral placed.

## 2016-03-03 NOTE — Telephone Encounter (Signed)
Ok to refer to Dr Katrinka BlazingSmith

## 2016-03-03 NOTE — Telephone Encounter (Signed)
Wife states that they do not want to go to Dr Ethelene Halamos, however pt wants a referral to Dr. Antoine PrimasZachary [name] with [facility].

## 2016-03-03 NOTE — Telephone Encounter (Signed)
Ok to change referral? Or can we just schedule pt?

## 2016-03-04 DIAGNOSIS — R972 Elevated prostate specific antigen [PSA]: Secondary | ICD-10-CM | POA: Diagnosis not present

## 2016-03-05 ENCOUNTER — Telehealth: Payer: Self-pay | Admitting: Cardiology

## 2016-03-05 NOTE — Telephone Encounter (Signed)
NEW MESSAGE   *STAT* If patient is at the pharmacy, call can be transferred to refill team.   1. Which medications need to be refilled? (please list name of each medication and dose if known) atorvastatin 80mg   2. Which pharmacy/location (including street and city if local pharmacy) is medication to be sent to? [facility] [phone]  3. Do they need a 30 day or 90 day supply? 90 day supply with 3 refills

## 2016-03-12 DIAGNOSIS — Z0000 Encounter for general adult medical examination without abnormal findings: Secondary | ICD-10-CM | POA: Diagnosis not present

## 2016-03-12 DIAGNOSIS — N401 Enlarged prostate with lower urinary tract symptoms: Secondary | ICD-10-CM | POA: Diagnosis not present

## 2016-03-12 DIAGNOSIS — R972 Elevated prostate specific antigen [PSA]: Secondary | ICD-10-CM | POA: Diagnosis not present

## 2016-03-12 DIAGNOSIS — N138 Other obstructive and reflux uropathy: Secondary | ICD-10-CM | POA: Diagnosis not present

## 2016-03-21 ENCOUNTER — Ambulatory Visit: Payer: Medicare Other | Admitting: Family Medicine

## 2016-03-26 ENCOUNTER — Encounter: Payer: Self-pay | Admitting: Family Medicine

## 2016-03-26 ENCOUNTER — Ambulatory Visit (INDEPENDENT_AMBULATORY_CARE_PROVIDER_SITE_OTHER): Payer: Medicare Other | Admitting: Family Medicine

## 2016-03-26 ENCOUNTER — Ambulatory Visit (INDEPENDENT_AMBULATORY_CARE_PROVIDER_SITE_OTHER)
Admission: RE | Admit: 2016-03-26 | Discharge: 2016-03-26 | Disposition: A | Discharge status: Home / Self Care | Payer: Medicare Other | Source: Ambulatory Visit | Attending: Family Medicine | Admitting: Family Medicine

## 2016-03-26 VITALS — BP 128/72 | HR 72 | Ht 66.0 in | Wt 160.0 lb

## 2016-03-26 DIAGNOSIS — M48062 Spinal stenosis, lumbar region with neurogenic claudication: Secondary | ICD-10-CM | POA: Insufficient documentation

## 2016-03-26 DIAGNOSIS — M5136 Other intervertebral disc degeneration, lumbar region: Secondary | ICD-10-CM | POA: Diagnosis not present

## 2016-03-26 DIAGNOSIS — M4806 Spinal stenosis, lumbar region: Secondary | ICD-10-CM | POA: Diagnosis not present

## 2016-03-26 MED ORDER — MELOXICAM 15 MG PO TABS: 15.0000 mg | ORAL_TABLET | Freq: Every day | ORAL | Status: DC

## 2016-03-26 MED ORDER — GABAPENTIN 100 MG PO CAPS: 200.0000 mg | ORAL_CAPSULE | Freq: Every day | ORAL | Status: DC

## 2016-03-26 NOTE — Progress Notes (Signed)
Pre visit review using our clinic review tool, if applicable. No additional management support is needed unless otherwise documented below in the visit note. 

## 2016-03-26 NOTE — Assessment & Plan Note (Addendum)
I believe the patient's signs and symptoms is actually consistent with more of a unilateral spinal stenosis. Differential also includes a gluteal tendinitis or lateral piriformis syndrome. These are less likely. Patient has more pain with extension of the back and does have more of the radicular symptoms going into the right buttocks but not going down the leg. No significant weakness. Patient's antalgic gait is concerning for possible hip pathology as well but patient does not have any groin pain with testing. We discussed icing regimen. Patient will start on gabapentin low dose as well as a short course of anti-inflammatories. X-rays are pending. Patient will start some over-the-counter natural supplementations as well as in follow-up with me again in 2-3 weeks for further evaluation. At that time we will either start home exercises or formal physical therapy. If worsening symptoms we may need to consider advanced imaging.

## 2016-03-26 NOTE — Progress Notes (Signed)
Tawana ScaleZach Airyana Sprunger D.O. [facility] [address] [place], [place] [address] Phone: [phone] Subjective:    I'm seeing this patient by the request  of:  Neena RhymesKatherine Tabori, MD   CC: Low back pain with radiation  OZH:[id number]:Subjective Viviana SimplerJack Kaman is a 81 y.o. male coming in with complaint of low back pain. Did greater than 3 months. Seems to be worsening. Did respond well to prednisone at first. Patient has had 3 rounds of prednisone each tenderness worked less and less. States that the pain seems to be more in the lower back and radiates to his right buttocks area. Denies going down his leg. States that it feels better when he walks in a flexed state and worse when he stands up straight. Denies any weakness but sometimes feels that he has to sit down quickly or he may fall. Denies any swelling or any numbness. States that it is very uncomfortable at night rates the severity pain is 8 out of 10. Does not hurt and without significant movement.   .    Past Medical History  Diagnosis Date  . Hyperlipidemia   . Hypertension   . Peripheral vascular disease (HCC)   . Vertigo   . Cognitive impairment, mild, so stated   . Colitis   . Vertebrobasilar artery syndrome   . Arthritis   . Elevated cholesterol   . Carotid artery occlusion   . DVT (deep venous thrombosis) (HCC)   . CAD (coronary artery disease) 2005    s/p PCI of RCA  . Erectile dysfunction   . GERD (gastroesophageal reflux disease)    Past Surgical History  Procedure Laterality Date  . Pr vein bypass graft,aorto-fem-pop  2007  . Appendectomy      Pt. was 81 yrs old   Social History   Social History  . Marital Status: Married    Spouse Name: N/A  . Number of Children: N/A  . Years of Education: N/A   Social History Main Topics  . Smoking status: Former Smoker    Quit date: 05/13/1961  . Smokeless tobacco: Never Used  . Alcohol Use: 0.0 oz/week    0 Standard drinks or equivalent per week     Comment: wine nightly    . Drug Use: No  . Sexual Activity: Not Asked   Other Topics Concern  . None   Social History Narrative   No Known Allergies Family History  Problem Relation Age of Onset  . Stroke Mother   . Hyperlipidemia Mother   . Hypertension Mother   . Deep vein thrombosis Mother   . Stroke Father   . Hyperlipidemia Father   . Hypertension Father   . Deep vein thrombosis Father     Past medical history, social, surgical and family history all reviewed in electronic medical record.  No pertanent information unless stated regarding to the chief complaint.   Review of Systems: No headache, visual changes, nausea, vomiting, diarrhea, constipation, dizziness, abdominal pain, skin rash, fevers, chills, night sweats, weight loss, swollen lymph nodes, body aches, joint swelling, muscle aches, chest pain, shortness of breath, mood changes.   Objective Blood pressure 128/72, pulse 72, height 5\' 6"  (1.676 m), weight 160 lb (72.576 kg), SpO2 96 %.  General: No apparent distress alert and oriented x3 mood and affect normal, dressed appropriately.  HEENT: Pupils equal, extraocular movements intact  Respiratory: Patient's speak in full sentences and does not appear short of breath  Cardiovascular: No lower extremity edema, non tender, no  erythema  Skin: Warm dry intact with no signs of infection or rash on extremities or on axial skeleton.  Abdomen: Soft nontender  Neuro: Cranial nerves II through XII are intact, neurovascularly intact in all extremities with 2+ DTRs and 2+ pulses.  Lymph: No lymphadenopathy of posterior or anterior cervical chain or axillae bilaterally.  Gait Significant antalgic gait patient was to walk in a flexed position. MSK:  Non tender with full range of motion and good stability and symmetric strength and tone of shoulders, elbows, wrist, hip, knee and ankles bilaterally.  Back Exam:  Inspection: Unremarkable  Motion: Flexion of 30, extension of 15, rotation of 15  bilaterally and side bending to 20 bilaterally. Rotation to 45 deg bilaterally  SLR laying: Mild positive right side XSLR laying: Negative  Palpable tenderness: None. FABER: Positive right side Sensory change: Gross sensation intact to all lumbar and sacral dermatomes.  Reflexes: 2+ at both patellar tendons, 2+ at achilles tendons, Babinski's downgoing.  Strength at foot  Plantar-flexion: 5/5 Dorsi-flexion: 5/5 Eversion: 5/5 Inversion: 5/5  Leg strength  5 Out of 5 strength for seems to be symmetric.    Impression and Recommendations:     This case required medical decision making of moderate complexity.      Note: This dictation was prepared with Dragon dictation along with smaller phrase technology. Any transcriptional errors that result from this process are unintentional.

## 2016-03-26 NOTE — Patient Instructions (Signed)
Very nice to meet you Xray downstairs today  Ice 20 minutes 2 times daily. Usually after activity and before bed. Gabapentin 100mg  at night for 3 nights then 200mg  thereafter Meloxicam daily for 10 days then as needed Take tylenol 500 mg three times a day is the best evidence based medicine we have for arthritis.  Vitamin D 2000 IU daily Tumeric 500mg  twice daily.  Tart cherry extract at night any dose.  Water aerobics and cycling with low resistance are the best two types of exercise for arthritis. Come back and see me in 3 weeks.

## 2016-03-27 ENCOUNTER — Other Ambulatory Visit: Payer: Self-pay | Admitting: *Deleted

## 2016-03-27 MED ORDER — ATORVASTATIN CALCIUM 80 MG PO TABS: 80.0000 mg | ORAL_TABLET | Freq: Every day | ORAL | Status: DC

## 2016-03-28 ENCOUNTER — Telehealth: Payer: Self-pay | Admitting: Cardiology

## 2016-03-28 NOTE — Telephone Encounter (Signed)
[facility]

## 2016-03-31 ENCOUNTER — Encounter: Payer: Self-pay | Admitting: Cardiology

## 2016-03-31 ENCOUNTER — Ambulatory Visit (INDEPENDENT_AMBULATORY_CARE_PROVIDER_SITE_OTHER): Payer: Medicare Other | Admitting: Cardiology

## 2016-03-31 VITALS — BP 164/66 | HR 63 | Ht 66.0 in | Wt 160.0 lb

## 2016-03-31 DIAGNOSIS — I2510 Atherosclerotic heart disease of native coronary artery without angina pectoris: Secondary | ICD-10-CM

## 2016-03-31 DIAGNOSIS — I6529 Occlusion and stenosis of unspecified carotid artery: Secondary | ICD-10-CM | POA: Diagnosis not present

## 2016-03-31 DIAGNOSIS — E785 Hyperlipidemia, unspecified: Secondary | ICD-10-CM | POA: Diagnosis not present

## 2016-03-31 DIAGNOSIS — I2583 Coronary atherosclerosis due to lipid rich plaque: Principal | ICD-10-CM

## 2016-03-31 DIAGNOSIS — I10 Essential (primary) hypertension: Secondary | ICD-10-CM

## 2016-03-31 LAB — LIPID PANEL
CHOL/HDL RATIO: 4.4 ratio (ref <=5.0)
Cholesterol: 189 mg/dL (ref 125–200)
HDL: 43 mg/dL (ref >=40)
LDL CALC: 103 mg/dL (ref <130)
Triglycerides: 215 mg/dL — ABNORMAL HIGH (ref <150)
VLDL: 43 mg/dL — ABNORMAL HIGH (ref <30)

## 2016-03-31 LAB — HEPATIC FUNCTION PANEL
ALBUMIN: 4.1 g/dL (ref 3.6–5.1)
ALK PHOS: 75 U/L (ref 40–115)
ALT: 27 U/L (ref 9–46)
AST: 26 U/L (ref 10–35)
BILIRUBIN DIRECT: 0.1 mg/dL (ref <=0.2)
BILIRUBIN TOTAL: 0.5 mg/dL (ref 0.2–1.2)
Indirect Bilirubin: 0.4 mg/dL (ref 0.2–1.2)
Total Protein: 6.1 g/dL (ref 6.1–8.1)

## 2016-03-31 LAB — BASIC METABOLIC PANEL
BUN: 21 mg/dL (ref 7–25)
CHLORIDE: 103 mmol/L (ref 98–110)
CO2: 27 mmol/L (ref 20–31)
CREATININE: 0.92 mg/dL (ref 0.70–1.18)
Calcium: 8.9 mg/dL (ref 8.6–10.3)
GLUCOSE: 85 mg/dL (ref 65–99)
Potassium: 4.3 mmol/L (ref 3.5–5.3)
Sodium: 139 mmol/L (ref 135–146)

## 2016-03-31 NOTE — Patient Instructions (Addendum)
Medication Instructions:  Your physician recommends that you continue on your current medications as directed. Please refer to the Current Medication list given to you today.   Labwork: TODAY: BMET, BNP, Lipids  Testing/Procedures: None  Follow-Up: Your physician wants you to follow-up in: 1 year with Dr. Mayford Knifeurner. You will receive a reminder letter in the mail two months in advance. If you don't receive a letter, please call our office to schedule the follow-up appointment.   Any Other Special Instructions Will Be Listed Below (If Applicable). Please check your blood pressure daily for a week and call with results.    If you need a refill on your cardiac medications before your next appointment, please call your pharmacy.

## 2016-03-31 NOTE — Progress Notes (Signed)
Cardiology Office Note    Date:  03/31/2016   ID:  [name] Zobel, DOB 04-12-1935, MRN [id number]  PCP:  Neena RhymesKatherine Tabori, MD  Cardiologist:  Armanda Magicraci Maddix Kliewer, MD   Chief Complaint  Patient presents with  . Coronary Artery Disease  . Hypertension  . Hyperlipidemia    History of Present Illness:  [name] Henry is a 81 y.o. male with a history of ASCAD, HTN,  Carotid artery stenosis  and dyslipidemia who presents today for followup. He is doing well. He denies any chest pain, SOB, DOE, LE edema, dizziness, palpitations or syncope.  He has been having problems with back pain and sciatica.   Past Medical History  Diagnosis Date  . Hyperlipidemia   . Hypertension   . Peripheral vascular disease (HCC)   . Vertigo   . Cognitive impairment, mild, so stated   . Colitis   . Vertebrobasilar artery syndrome   . Arthritis   . Carotid artery stenosis     40-59% left and < 40% right by dopplers 10/2015  . DVT (deep venous thrombosis) (HCC)   . CAD (coronary artery disease) 2005    s/p PCI of RCA  . Erectile dysfunction   . GERD (gastroesophageal reflux disease)     Past Surgical History  Procedure Laterality Date  . Pr vein bypass graft,aorto-fem-pop  2007  . Appendectomy      Pt. was 81 yrs old    Current Medications: Outpatient Prescriptions Prior to Visit  Medication Sig Dispense Refill  . aspirin 81 MG tablet Take 81 mg by mouth daily.    Marland Kitchen. atorvastatin (LIPITOR) 80 MG tablet Take 1 tablet (80 mg total) by mouth daily. 90 tablet 0  . carvedilol (COREG) 6.25 MG tablet Take 1 tablet (6.25 mg total) by mouth 2 (two) times daily. 180 tablet 2  . donepezil (ARICEPT) 10 MG tablet Take 10 mg by mouth daily.      . fish oil-omega-3 fatty acids 1000 MG capsule Take 1 g by mouth 2 (two) times daily.    Marland Kitchen. gabapentin (NEURONTIN) 100 MG capsule Take 2 capsules (200 mg total) by mouth at bedtime. 60 capsule 3  . hydrochlorothiazide (HYDRODIURIL) 25 MG tablet Take 1 tablet (25 mg total) by  mouth daily. 90 tablet 0  . meloxicam (MOBIC) 15 MG tablet Take 1 tablet (15 mg total) by mouth daily. 30 tablet 0   No facility-administered medications prior to visit.     Allergies:   Review of patient's allergies indicates no known allergies.   Social History   Social History  . Marital Status: Married    Spouse Name: N/A  . Number of Children: N/A  . Years of Education: N/A   Social History Main Topics  . Smoking status: Former Smoker    Quit date: 05/13/1961  . Smokeless tobacco: Never Used  . Alcohol Use: 0.0 oz/week    0 Standard drinks or equivalent per week     Comment: wine nightly  . Drug Use: No  . Sexual Activity: Not Asked   Other Topics Concern  . None   Social History Narrative     Family History:  The patient's family history includes Deep vein thrombosis in his father and mother; Hyperlipidemia in his father and mother; Hypertension in his father and mother; Stroke in his father and mother.   ROS:   Please see the history of present illness.    ROS All other systems reviewed and are negative.   PHYSICAL EXAM:  VS:  BP 164/66 mmHg  Pulse 63  Ht  (1.676 m)  Wt 160 lb (72.576 kg)  BMI 25.84 kg/m2   GEN: Well nourished, well developed, in no acute distress HEENT: normal Neck: no JVD, carotid bruits, or masses Cardiac: RRR; no murmurs, rubs, or gallops,no edema.  Intact distal pulses bilaterally.  Respiratory:  clear to auscultation bilaterally, normal work of breathing GI: soft, nontender, nondistended, + BS MS: no deformity or atrophy Skin: warm and dry, no rash Neuro:  Alert and Oriented x 3, Strength and sensation are intact Psych: euthymic mood, full affect  Wt Readings from Last 3 Encounters:  03/31/16 160 lb (72.576 kg)  03/26/16 160 lb (72.576 kg)  02/29/16 156 lb (70.761 kg)      Studies/Labs Reviewed:   EKG:  EKG is  ordered today.  The ekg ordered today demonstrates NSR with septal infarct and no ST changes.  Recent  Labs: 09/10/2015: ALT 38; BUN 18; Creatinine, Ser 0.98; Hemoglobin 14.3; Platelets 184.0; Potassium 4.7; Sodium 142   Lipid Panel    Component Value Date/Time   CHOL 123 09/10/2015 0920   TRIG 81.0 09/10/2015 0920   HDL 48.20 09/10/2015 0920   CHOLHDL 3 09/10/2015 0920   VLDL 16.2 09/10/2015 0920   LDLCALC 58 09/10/2015 0920    Additional studies/ records that were reviewed today include:  none    ASSESSMENT:    1. Coronary artery disease due to lipid rich plaque   2. Carotid artery stenosis, unspecified laterality   3. Essential hypertension   4. Hyperlipidemia      PLAN:  In order of problems listed above:  1. ASCAD s/p PCI of the RCA remotely with no angina.  Continue ASA/statin/BB. 2. Carotid artery stenosis - moderate by dopplers 10/2015.  Repeat dopplers in 1 year at Dr. Adele Dan office.  Continue ASA/statin. 3. HTN - BP borderline controlled on current medical regimen. Continue BB/diuretic.  I have asked him to check his BP daily for a week and call with the results.  4. Hyperlipidemia - LDL goal < 70.  Check FLP and ALT.  Continue statin.    Medication Adjustments/Labs and Tests Ordered: Current medicines are reviewed at length with the patient today.  Concerns regarding medicines are outlined above.  Medication changes, Labs and Tests ordered today are listed in the Patient Instructions below.  There are no Patient Instructions on file for this visit.   Signed, Armanda Magic, MD  03/31/2016 10:24 AM    [facility] [address] [place], [place], [place]  [address] Phone: [phone]; Fax: [phone]

## 2016-04-02 ENCOUNTER — Telehealth: Payer: Self-pay

## 2016-04-02 DIAGNOSIS — E785 Hyperlipidemia, unspecified: Secondary | ICD-10-CM

## 2016-04-02 NOTE — Telephone Encounter (Signed)
-----   Message from Quintella Reichertraci R Turner, MD sent at 04/01/2016 12:58 PM EDT ----- LDL  Not at goal of < 70 - add zetia 10mg  daily and repeat FLp and ALT In 8 weeks

## 2016-04-02 NOTE — Telephone Encounter (Signed)
Informed patient of results and verbal understanding expressed.  Patient st he has not taken his Lipitor in a month because the refill department did not send in refills. He st he came in the office 5/10 and requested refills but they were not sent in until June 1. He received the medicine today and will take his first dose tonight. Instructed patient to keep medications the same and scheduled repeat FLP and ALT 7/19. Patient agrees with treatment plan.

## 2016-04-08 ENCOUNTER — Ambulatory Visit (INDEPENDENT_AMBULATORY_CARE_PROVIDER_SITE_OTHER): Payer: Medicare Other | Admitting: Family Medicine

## 2016-04-08 ENCOUNTER — Other Ambulatory Visit: Payer: Self-pay

## 2016-04-08 VITALS — BP 122/82 | HR 66 | Wt 159.0 lb

## 2016-04-08 DIAGNOSIS — M47818 Spondylosis without myelopathy or radiculopathy, sacral and sacrococcygeal region: Secondary | ICD-10-CM | POA: Insufficient documentation

## 2016-04-08 DIAGNOSIS — M533 Sacrococcygeal disorders, not elsewhere classified: Secondary | ICD-10-CM

## 2016-04-08 DIAGNOSIS — G8929 Other chronic pain: Secondary | ICD-10-CM

## 2016-04-08 DIAGNOSIS — M48062 Spinal stenosis, lumbar region with neurogenic claudication: Secondary | ICD-10-CM

## 2016-04-08 DIAGNOSIS — M12819 Other specific arthropathies, not elsewhere classified, unspecified shoulder: Secondary | ICD-10-CM

## 2016-04-08 DIAGNOSIS — M4806 Spinal stenosis, lumbar region: Secondary | ICD-10-CM | POA: Diagnosis not present

## 2016-04-08 NOTE — Assessment & Plan Note (Signed)
Patient given injection today. Hopefully this is therapeutic as well as diagnostic. Patient does not have any significant improvement with this I think that there is a possibility of spinal stenosis. Patient is having this pain chronically and it is affecting daily activities. No significant improvement over the course the next 72 hours I do feel that advance imaging for an MRI to rule out spinal stenosis would be recommended. Patient will be: At that time and we'll see how he is doing. Discussed that he would be a candidate for an epidural. He is not on a blood thinner. Patient was still avoid any surgical intervention. Patient did have fairly good response to the injection and hopefully that this will take care of the majority the pain. Continue same medications.

## 2016-04-08 NOTE — Patient Instructions (Addendum)
Good to see you  With your symptoms I am concern it could be coming from your back  We will try an injection today  If not a lot better then we will need MRI of your back, especially if it starts going down your legs more.  Call me in a week and if not better we will order the MRI.  Continue the other medications.  Keep the appointment on the 26th in case but we may be moving it depending how you are doing Enjoy the beach.

## 2016-04-08 NOTE — Progress Notes (Signed)
Robert ScaleZach [name] D.O. [facility] [address] [name], [place] [address] Phone: [phone] Subjective:    I'm seeing this patient by the request  of:  Neena RhymesKatherine Tabori, MD   CC: Low back pain with radiation Follow-up  BJY:[id number]:Subjective Robert SimplerJack [name] is a 81 y.o. male coming in with complaint of low back pain. Patient is having back pain with radiculopathy. Concern was for spinal stenosis with neurogenic claudication. Patient did have x-rays showing degenerative disc disease at L4-L5 and significant loss. Patient elected to try conservative therapy which included home exercises, icing protocol, meloxicam and gabapentin. Patient states He has not made any significant improvement at this time. States that it seems to be more localized than what it was previously and may be decreased radicular symptoms down the leg. Patient states it seems to be more on the right posterior aspect. Points right over the right sacroiliac joint. States anytime he tries to straighten up the pain seems to be somewhat worse. Rates the severity of pain as 6 out of 10 and possibly worsening. It is starting to affect daily activities and first steps in the morning has been very severe. .    Past Medical History  Diagnosis Date  . Hyperlipidemia   . Hypertension   . Peripheral vascular disease (HCC)   . Vertigo   . Cognitive impairment, mild, so stated   . Colitis   . Vertebrobasilar artery syndrome   . Arthritis   . Carotid artery stenosis     40-59% left and < 40% right by dopplers 10/2015  . DVT (deep venous thrombosis) (HCC)   . CAD (coronary artery disease) 2005    s/p PCI of RCA  . Erectile dysfunction   . GERD (gastroesophageal reflux disease)    Past Surgical History  Procedure Laterality Date  . Pr vein bypass graft,aorto-fem-pop  2007  . Appendectomy      Pt. was 81 yrs old   Social History   Social History  . Marital Status: Married    Spouse Name: N/A  . Number of Children: N/A  .  Years of Education: N/A   Social History Main Topics  . Smoking status: Former Smoker    Quit date: 05/13/1961  . Smokeless tobacco: Never Used  . Alcohol Use: 0.0 oz/week    0 Standard drinks or equivalent per week     Comment: wine nightly  . Drug Use: No  . Sexual Activity: Not on file   Other Topics Concern  . Not on file   Social History Narrative   No Known Allergies Family History  Problem Relation Age of Onset  . Stroke Mother   . Hyperlipidemia Mother   . Hypertension Mother   . Deep vein thrombosis Mother   . Stroke Father   . Hyperlipidemia Father   . Hypertension Father   . Deep vein thrombosis Father     Past medical history, social, surgical and family history all reviewed in electronic medical record.  No pertanent information unless stated regarding to the chief complaint.   Review of Systems: No headache, visual changes, nausea, vomiting, diarrhea, constipation, dizziness, abdominal pain, skin rash, fevers, chills, night sweats, weight loss, swollen lymph nodes, body aches, joint swelling, muscle aches, chest pain, shortness of breath, mood changes.   Objective Blood pressure 122/82, pulse 66, weight 159 lb (72.122 kg).  General: No apparent distress alert and oriented x3 mood and affect normal, dressed appropriately.  HEENT: Pupils equal, extraocular movements intact  Respiratory:  Patient's speak in full sentences and does not appear short of breath  Cardiovascular: No lower extremity edema, non tender, no erythema  Skin: Warm dry intact with no signs of infection or rash on extremities or on axial skeleton.  Abdomen: Soft nontender  Neuro: Cranial nerves II through XII are intact, neurovascularly intact in all extremities with 2+ DTRs and 2+ pulses.  Lymph: No lymphadenopathy of posterior or anterior cervical chain or axillae bilaterally.  Gait Significant antalgic gait patient was to walk in a flexed position.Possibly more flexed than previous  exam MSK:  Non tender with full range of motion and good stability and symmetric strength and tone of shoulders, elbows, wrist, hip, knee and ankles bilaterally.  Back Exam:  Inspection: Unremarkable  Motion: Flexion of 30, extension of 15, rotation of 15 bilaterally and side bending to 20 bilaterally. Rotation to 45 deg bilaterally  SLR laying: Mild positive right XSLR laying: Negative  Palpable tenderness: Severe tenderness over the right SI joint FABER: Positive right side Sensory change: Gross sensation intact to all lumbar and sacral dermatomes.  Reflexes: 2+ at both patellar tendons, 2+ at achilles tendons, Babinski's downgoing.  Strength at foot  Plantar-flexion: 5/5 Dorsi-flexion: 5/5 Eversion: 5/5 Inversion: 5/5  Leg strength  5 Out of 5 strength for seems to be symmetric.  Procedure: Real-time Ultrasound Guided Injection of right sacroiliac joint Device: GE Logiq E  Ultrasound guided injection is preferred based studies that show increased duration, increased effect, greater accuracy, decreased procedural pain, increased response rate, and decreased cost with ultrasound guided versus blind injection.  Verbal informed consent obtained.  Time-out conducted.  Noted no overlying erythema, induration, or other signs of local infection.  Skin prepped in a sterile fashion.  Local anesthesia: Topical Ethyl chloride.  With sterile technique and under real time ultrasound guidance:  With a 21-gauge 2 inch no patient was injected with a total of 0.5 mL of 0.5% Marcaine and 0.5 mL of Kenalog 40 mg/dL. Completed without difficulty  Pain immediately resolved suggesting accurate placement of the medication.  Advised to call if fevers/chills, erythema, induration, drainage, or persistent bleeding.  Images permanently stored and available for review in the ultrasound unit.  Impression: Technically successful ultrasound guided injection.    Impression and Recommendations:     This  case required medical decision making of moderate complexity.      Note: This dictation was prepared with Dragon dictation along with smaller phrase technology. Any transcriptional errors that result from this process are unintentional.

## 2016-04-11 ENCOUNTER — Telehealth: Payer: Self-pay

## 2016-04-11 ENCOUNTER — Other Ambulatory Visit: Payer: Self-pay

## 2016-04-11 DIAGNOSIS — M48061 Spinal stenosis, lumbar region without neurogenic claudication: Secondary | ICD-10-CM

## 2016-04-11 NOTE — Telephone Encounter (Signed)
We need MRI of lumbar [place] to rule out spinal stenosis.

## 2016-04-11 NOTE — Telephone Encounter (Signed)
Spoke with patient. Ordering MRI

## 2016-04-11 NOTE — Telephone Encounter (Signed)
Patient called and said you asked him to call and say how he was feeling after the injection he had this week. He states Tuesday after it he was feeling better. But by the next day he wasn't feeling the same. States he is still hurting. Please follow up on what he needs to do now.

## 2016-04-21 ENCOUNTER — Encounter: Payer: Self-pay | Admitting: Family Medicine

## 2016-04-21 ENCOUNTER — Ambulatory Visit: Payer: Medicare Other | Admitting: Family Medicine

## 2016-04-21 ENCOUNTER — Ambulatory Visit (INDEPENDENT_AMBULATORY_CARE_PROVIDER_SITE_OTHER): Payer: Medicare Other | Admitting: Family Medicine

## 2016-04-21 VITALS — BP 128/70 | HR 71 | Ht 66.0 in | Wt 155.0 lb

## 2016-04-21 DIAGNOSIS — M4806 Spinal stenosis, lumbar region: Secondary | ICD-10-CM

## 2016-04-21 DIAGNOSIS — M47818 Spondylosis without myelopathy or radiculopathy, sacral and sacrococcygeal region: Secondary | ICD-10-CM

## 2016-04-21 DIAGNOSIS — M48062 Spinal stenosis, lumbar region with neurogenic claudication: Secondary | ICD-10-CM

## 2016-04-21 DIAGNOSIS — M12819 Other specific arthropathies, not elsewhere classified, unspecified shoulder: Secondary | ICD-10-CM | POA: Diagnosis not present

## 2016-04-21 MED ORDER — MELOXICAM 15 MG PO TABS: 15.0000 mg | ORAL_TABLET | Freq: Every day | ORAL | Status: DC

## 2016-04-21 NOTE — Progress Notes (Signed)
Pre visit review using our clinic review tool, if applicable. No additional management support is needed unless otherwise documented below in the visit note. 

## 2016-04-21 NOTE — Patient Instructions (Signed)
Good to see you  I should have the info on Monday from the MRI and will be contacting you.  If an injection will help we will schedule it.  Keep doing everything else.  Refilled meloxicam  If we have the injection I want to see you again 2 weeks late.r

## 2016-04-21 NOTE — Assessment & Plan Note (Signed)
With patient failing conservative therapy and not responding to the injection in the sacroiliac joint to do feel that advance imaging is warranted. MRI is ordered today to further evaluate patient's lumbar spine. Patient has not made any significant change otherwise. Does have known degenerative disc disease and I'm looking for an possible spinal stenosis as well as nerve root impingement. Depending on level of impingement we make consider injection. Patient is willing to do this. Encourage him to potentially try the gabapentin on a more regular basis. Patient has been doing the meloxicam intermittently but we did recently today. Patient will follow-up with me again in 2 weeks after an injection if necessary.  Spent  25 minutes with patient face-to-face and had greater than 50% of counseling including as described above in assessment and plan.

## 2016-04-21 NOTE — Assessment & Plan Note (Signed)
Failed injection. We'll continue to monitor but likely more radicular symptoms from the lumbar spine.

## 2016-04-21 NOTE — Progress Notes (Signed)
[name] [address] [place], [place] [address] Phone: [phone] Subjective:    I'm seeing this patient by the request  of:  Neena RhymesKatherine Tabori, MD   CC: Low back pain with radiation Follow-up  BJY:[id number]:Subjective Robert SimplerJack [name] is a 81 y.o. male coming in with complaint of low back pain. Patient is having back pain with radiculopathy. Concern was for spinal stenosis with neurogenic claudication. Patient did have x-rays showing degenerative disc disease at L4-L5 and significant loss. Patient and failed all conservative therapy. Patient attempted to have a sacroiliac joint injection. Patient states that he can have 24 hours of complete resolution of pain and then it came back immediately. States that some mild radiation into the right buttocks now. States that when he stands for long amount of time he can give him discomfort. Has failed all other conservative therapy. States that it is affecting daily activities. Even standing long amount of time can give him difficulty. States even finding a comfortable position at night can be hard and sometimes he does not sleep in the bed due to the extreme extension causing more pain. .    Past Medical History  Diagnosis Date  . Hyperlipidemia   . Hypertension   . Peripheral vascular disease (HCC)   . Vertigo   . Cognitive impairment, mild, so stated   . Colitis   . Vertebrobasilar artery syndrome   . Arthritis   . Carotid artery stenosis     40-59% left and < 40% right by dopplers 10/2015  . DVT (deep venous thrombosis) (HCC)   . CAD (coronary artery disease) 2005    s/p PCI of RCA  . Erectile dysfunction   . GERD (gastroesophageal reflux disease)    Past Surgical History  Procedure Laterality Date  . Pr vein bypass graft,aorto-fem-pop  2007  . Appendectomy      Pt. was 81 yrs old   Social History   Social History  . Marital Status: Married    Spouse Name: N/A  . Number of Children: N/A  . Years of  Education: N/A   Social History Main Topics  . Smoking status: Former Smoker    Quit date: 05/13/1961  . Smokeless tobacco: Never Used  . Alcohol Use: 0.0 oz/week    0 Standard drinks or equivalent per week     Comment: wine nightly  . Drug Use: No  . Sexual Activity: Not Asked   Other Topics Concern  . None   Social History Narrative   No Known Allergies Family History  Problem Relation Age of Onset  . Stroke Mother   . Hyperlipidemia Mother   . Hypertension Mother   . Deep vein thrombosis Mother   . Stroke Father   . Hyperlipidemia Father   . Hypertension Father   . Deep vein thrombosis Father     Past medical history, social, surgical and family history all reviewed in electronic medical record.  No pertanent information unless stated regarding to the chief complaint.   Review of Systems: No headache, visual changes, nausea, vomiting, diarrhea, constipation, dizziness, abdominal pain, skin rash, fevers, chills, night sweats, weight loss, swollen lymph nodes, body aches, joint swelling, muscle aches, chest pain, shortness of breath, mood changes.   Objective Blood pressure 128/70, pulse 71, height 5\' 6"  (1.676 m), weight 155 lb (70.308 kg), SpO2 96 %.  General: No apparent distress alert and oriented x3 mood and affect normal, dressed appropriately.  HEENT: Pupils equal, extraocular movements intact  Respiratory: Patient's speak in full sentences and does not appear short of breath  Cardiovascular: No lower extremity edema, non tender, no erythema  Skin: Warm dry intact with no signs of infection or rash on extremities or on axial skeleton.  Abdomen: Soft nontender  Neuro: Cranial nerves II through XII are intact, neurovascularly intact in all extremities with 2+ DTRs and 2+ pulses.  Lymph: No lymphadenopathy of posterior or anterior cervical chain or axillae bilaterally.  Gait Patient continues to be more in a flexed position but maybe some mild improvement from  previous exam MSK:  Non tender with full range of motion and good stability and symmetric strength and tone of shoulders, elbows, wrist, hip, knee and ankles bilaterally.  Back Exam:  Inspection: Unremarkable  Motion: Flexion of 30, extension of 15, rotation of 15 bilaterally and side bending to 20 bilaterally. Rotation to 25 deg bilaterally still significant restriction in range of motion possibly worse. SLR laying: Continue mild positive right XSLR laying: Negative  Palpable tenderness: Mild tenderness over the right SI joint FABER: Positive right side Sensory change: Gross sensation intact to all lumbar and sacral dermatomes.  Reflexes: 2+ at both patellar tendons, 2+ at achilles tendons, Babinski's downgoing.  Strength at foot  Plantar-flexion: 5/5 Dorsi-flexion: 5/5 Eversion: 5/5 Inversion: 5/5  Leg strength  5 Out of 5 strength for seems to be symmetric.      Impression and Recommendations:     This case required medical decision making of moderate complexity.      Note: This dictation was prepared with Dragon dictation along with smaller phrase technology. Any transcriptional errors that result from this process are unintentional.

## 2016-04-26 ENCOUNTER — Ambulatory Visit
Admission: RE | Admit: 2016-04-26 | Discharge: 2016-04-26 | Disposition: A | Discharge status: Home / Self Care | Payer: Medicare Other | Source: Ambulatory Visit | Attending: Family Medicine | Admitting: Family Medicine

## 2016-04-26 DIAGNOSIS — M48061 Spinal stenosis, lumbar region without neurogenic claudication: Secondary | ICD-10-CM

## 2016-04-26 DIAGNOSIS — M4806 Spinal stenosis, lumbar region: Secondary | ICD-10-CM | POA: Diagnosis not present

## 2016-05-08 ENCOUNTER — Other Ambulatory Visit: Payer: Self-pay

## 2016-05-08 ENCOUNTER — Telehealth: Payer: Self-pay | Admitting: Family Medicine

## 2016-05-08 DIAGNOSIS — M48062 Spinal stenosis, lumbar region with neurogenic claudication: Secondary | ICD-10-CM

## 2016-05-08 NOTE — Telephone Encounter (Signed)
Order placed

## 2016-05-08 NOTE — Telephone Encounter (Signed)
Patient does not want to go to Dr. Ethelene Halamos if this is something he has to be sent out of the office for.

## 2016-05-08 NOTE — Telephone Encounter (Signed)
Lets try a L3-4 Epidural please place order Thank you

## 2016-05-08 NOTE — Telephone Encounter (Signed)
Patient came in office today to check on MRI results.  I gave him MD response.  Patient does want to proceed with injections.  Please follow back up with patient in regard.

## 2016-05-14 ENCOUNTER — Ambulatory Visit
Admission: RE | Admit: 2016-05-14 | Discharge: 2016-05-14 | Disposition: A | Discharge status: Home / Self Care | Payer: Medicare Other | Source: Ambulatory Visit | Attending: Family Medicine | Admitting: Family Medicine

## 2016-05-14 ENCOUNTER — Other Ambulatory Visit (INDEPENDENT_AMBULATORY_CARE_PROVIDER_SITE_OTHER): Payer: Medicare Other | Admitting: *Deleted

## 2016-05-14 DIAGNOSIS — M4806 Spinal stenosis, lumbar region: Secondary | ICD-10-CM | POA: Diagnosis not present

## 2016-05-14 DIAGNOSIS — M48062 Spinal stenosis, lumbar region with neurogenic claudication: Secondary | ICD-10-CM

## 2016-05-14 DIAGNOSIS — E785 Hyperlipidemia, unspecified: Secondary | ICD-10-CM

## 2016-05-14 LAB — LIPID PANEL
Cholesterol: 106 mg/dL — ABNORMAL LOW (ref 125–200)
HDL: 44 mg/dL (ref >=40)
LDL CALC: 38 mg/dL (ref <130)
Total CHOL/HDL Ratio: 2.4 Ratio (ref <=5.0)
Triglycerides: 118 mg/dL (ref <150)
VLDL: 24 mg/dL (ref <30)

## 2016-05-14 LAB — ALT: ALT: 22 U/L (ref 9–46)

## 2016-05-14 MED ORDER — IOPAMIDOL (ISOVUE-M 200) INJECTION 41%
1.0000 mL | Freq: Once | INTRAMUSCULAR | Status: AC
  Administered 2016-05-14: 1 mL via EPIDURAL

## 2016-05-14 MED ORDER — METHYLPREDNISOLONE ACETATE 40 MG/ML INJ SUSP (RADIOLOG
120.0000 mg | Freq: Once | INTRAMUSCULAR | Status: AC
  Administered 2016-05-14: 120 mg via EPIDURAL

## 2016-05-14 NOTE — Discharge Instructions (Signed)

## 2016-05-16 ENCOUNTER — Telehealth: Payer: Self-pay | Admitting: *Deleted

## 2016-05-16 NOTE — Telephone Encounter (Signed)
Add amlodipine 5mg  daily and check BP daily for a week and call with resutls

## 2016-05-16 NOTE — Telephone Encounter (Signed)
The patient dropped off the following BP record on 05/14/2016: (Will forward to Dr. Mayford Knifeurner for review)  6/6 145/62 6/7  142/67 6/8  153/64 6/9  158/67 6/10 164/75 6/11 144/75 6/12 139/59 6/13 150/62 6/15 164/78 6/16 160/76 6/25 152/68 6/26 153/72 6/27 142/56 6/28 155/59 6/29 155/52 7/1 163/40 7/4 145/64 7/5 155/48 7/9 142/59 7/10 134/62 7/11 147/68 7/12 147/64 7/14 150/64 7/16 148/52 7/19 158/65

## 2016-05-22 ENCOUNTER — Other Ambulatory Visit: Payer: Self-pay

## 2016-05-22 MED ORDER — HYDROCHLOROTHIAZIDE 25 MG PO TABS: 25.0000 mg | ORAL_TABLET | Freq: Every day | ORAL | 0 refills | Status: DC

## 2016-05-26 ENCOUNTER — Other Ambulatory Visit: Payer: Self-pay | Admitting: *Deleted

## 2016-05-26 MED ORDER — HYDROCHLOROTHIAZIDE 25 MG PO TABS: 25.0000 mg | ORAL_TABLET | Freq: Every day | ORAL | 2 refills | Status: DC

## 2016-05-27 NOTE — Telephone Encounter (Signed)
Left message to call back  

## 2016-05-27 NOTE — Progress Notes (Signed)
[name] [address] [place], [place] [address] Phone: [phone] Subjective:    I'm seeing this patient by the request  of:  Neena Rhymes, MD   CC: Low back pain with radiation Follow-up  XLK:[id number]  [name] is a 81 y.o. male coming in with complaint of low back pain. Patient is having back pain with radiculopathy. Concern was for spinal stenosis with neurogenic claudication. Patient did have x-rays showing degenerative disc disease at L4-L5 and significant loss. Patient and failed all conservative therapy. Patient attempted to have a sacroiliac joint injection. Patient states that he can have 24 hours of complete resolution of pain and then it came back immediately.  Continued to have worsening symptoms. Marland Kitchen  sent for an MRI of the lumbar spine. Independently visualized by me and showed significant moderate to marked bilateral foraminal narrowing and encroachment on the L3, L4 and L5 nerve roots. Also significant multilevel spinal canal stenosis Patient did have an epidural L3-L4 on 05/14/2016  Patient states Doing significantly better. No longer having any pain. Patient states that also the leg pain has completely resolved. Able to walk longer distances. Feeling much better overall. Happy with the results.     Past Medical History:  Diagnosis Date  . Arthritis   . CAD (coronary artery disease) 2005   s/p PCI of RCA  . Carotid artery stenosis    40-59% left and < 40% right by dopplers 10/2015  . Cognitive impairment, mild, so stated   . Colitis   . DVT (deep venous thrombosis) (HCC)   . Erectile dysfunction   . GERD (gastroesophageal reflux disease)   . Hyperlipidemia   . Hypertension   . Peripheral vascular disease (HCC)   . Vertebrobasilar artery syndrome   . Vertigo    Past Surgical History:  Procedure Laterality Date  . APPENDECTOMY     Pt. was 81 yrs old  . PR VEIN BYPASS GRAFT,AORTO-FEM-POP  2007   Social History    Social History  . Marital status: Married    Spouse name: N/A  . Number of children: N/A  . Years of education: N/A   Social History Main Topics  . Smoking status: Former Smoker    Quit date: 05/13/1961  . Smokeless tobacco: Never Used  . Alcohol use 0.0 oz/week     Comment: wine nightly  . Drug use: No  . Sexual activity: Not Asked   Other Topics Concern  . None   Social History Narrative  . None   No Known Allergies Family History  Problem Relation Age of Onset  . Stroke Mother   . Hyperlipidemia Mother   . Hypertension Mother   . Deep vein thrombosis Mother   . Stroke Father   . Hyperlipidemia Father   . Hypertension Father   . Deep vein thrombosis Father     Past medical history, social, surgical and family history all reviewed in electronic medical record.  No pertanent information unless stated regarding to the chief complaint.   Review of Systems: No headache, visual changes, nausea, vomiting, diarrhea, constipation, dizziness, abdominal pain, skin rash, fevers, chills, night sweats, weight loss, swollen lymph nodes, body aches, joint swelling, muscle aches, chest pain, shortness of breath, mood changes.   Objective  Blood pressure 124/72, pulse 66, weight 152 lb (68.9 kg), SpO2 93 %.  General: No apparent distress alert and oriented x3 mood and affect normal, dressed appropriately.  HEENT: Pupils equal, extraocular movements intact  Respiratory:  Patient's speak in full sentences and does not appear short of breath  Cardiovascular: No lower extremity edema, non tender, no erythema  Skin: Warm dry intact with no signs of infection or rash on extremities or on axial skeleton.  Abdomen: Soft nontender  Neuro: Cranial nerves II through XII are intact, neurovascularly intact in all extremities with 2+ DTRs and 2+ pulses.  Lymph: No lymphadenopathy of posterior or anterior cervical chain or axillae bilaterally.  Gait Patient continues to be more in a flexed  position but maybe some mild improvement from previous exam MSK:  Non tender with full range of motion and good stability and symmetric strength and tone of shoulders, elbows, wrist, hip, knee and ankles bilaterally.  Back Exam:  Inspection: Unremarkable  Motion: Flexion of 40, extension of 18, rotation of 25 bilaterally and side bending to 20 bilaterally. Rotation to 35 deg bilaterally much better range of motion SLR laying: Negative XSLR laying: Negative  Palpable tenderness: Very mild tenderness in the paraspinal musculature of the lumbar spine FABER: Negative Sensory change: Gross sensation intact to all lumbar and sacral dermatomes.  Reflexes: 2+ at both patellar tendons, 2+ at achilles tendons, Babinski's downgoing.  Strength at foot  Plantar-flexion: 5/5 Dorsi-flexion: 5/5 Eversion: 5/5 Inversion: 5/5  Leg strength  5 Out of 5 strength for seems to be symmetric.      Impression and Recommendations:     This case required medical decision making of moderate complexity.      Note: This dictation was prepared with Dragon dictation along with smaller phrase technology. Any transcriptional errors that result from this process are unintentional.

## 2016-05-28 ENCOUNTER — Encounter: Payer: Self-pay | Admitting: Family Medicine

## 2016-05-28 ENCOUNTER — Telehealth: Payer: Self-pay | Admitting: Cardiology

## 2016-05-28 ENCOUNTER — Ambulatory Visit (INDEPENDENT_AMBULATORY_CARE_PROVIDER_SITE_OTHER): Payer: Medicare Other | Admitting: Family Medicine

## 2016-05-28 DIAGNOSIS — M48062 Spinal stenosis, lumbar region with neurogenic claudication: Secondary | ICD-10-CM

## 2016-05-28 DIAGNOSIS — M4806 Spinal stenosis, lumbar region: Secondary | ICD-10-CM

## 2016-05-28 MED ORDER — AMLODIPINE BESYLATE 5 MG PO TABS: 5.0000 mg | ORAL_TABLET | Freq: Every day | ORAL | 11 refills | Status: DC

## 2016-05-28 NOTE — Telephone Encounter (Signed)
See 7/21 phone note.

## 2016-05-28 NOTE — Telephone Encounter (Signed)
Instructed patient to START AMLODIPINE 5 mg daily, check BP daily for a week and call with results.  Patient agrees with treatment plan.

## 2016-05-28 NOTE — Assessment & Plan Note (Signed)
Patient is doing remarkably well after the epidural. Encourage him to continue the gabapentin at night. We discussed icing regimen. Discussed continuing stay active. Discussed with patient about home exercises and was given a handout. When overall in fairly good detail. Patient's questions were answered. We discussed if any exacerbation we can repeat injection if necessary. Follow-up on an as-needed basis.  Spent  25 minutes with patient face-to-face and had greater than 50% of counseling including as described above in assessment and plan.

## 2016-05-28 NOTE — Patient Instructions (Signed)
Good to see you  You are doing great  We can repeat the injection in the back at any time if needed.  Just call and I will order it.  I am hoping though you get many months of improvement.  Exercises 2-3 times a week.  Stay active If doing well see me when you need me.

## 2016-05-28 NOTE — Telephone Encounter (Signed)
New Message  Pt returning phone call of nurse from yesterday.

## 2016-05-28 NOTE — Addendum Note (Signed)
Addended by: Gunnar Fusi A on: 05/28/2016 10:04 AM   Modules accepted: Orders

## 2016-05-29 ENCOUNTER — Telehealth: Payer: Self-pay | Admitting: Cardiology

## 2016-05-29 NOTE — Telephone Encounter (Signed)
New Message   *STAT* If patient is at the pharmacy, call can be transferred to refill team.   1. Which medications need to be refilled? (please list name of each medication and dose if known) pt states he does not know   2. Which pharmacy/location (including street and city if local pharmacy) is medication to be sent to? [facility] [address]  3. Do they need a 30 day or 90 day supply? NA    Pt call requesting to speak with RN about a BP med he was to pick up from the pharmacy. Pt does not know the name of the med, but stats it was sent to the wrong pharmacy. Pt states the prescription is to go to the [facility] on [address]. Please call back to discuss

## 2016-05-29 NOTE — Telephone Encounter (Signed)
Apologized to patient for misunderstanding requested pharmacy.  Patient states he is more than willing to wait another day or two to receive medication from mail order. After he receives medication he will check BP for a week and call with results.

## 2016-06-04 ENCOUNTER — Other Ambulatory Visit: Payer: Self-pay

## 2016-06-04 MED ORDER — CARVEDILOL 6.25 MG PO TABS: 6.2500 mg | ORAL_TABLET | Freq: Two times a day (BID) | ORAL | 1 refills | Status: DC

## 2016-06-17 ENCOUNTER — Other Ambulatory Visit: Payer: Self-pay | Admitting: *Deleted

## 2016-06-17 MED ORDER — ATORVASTATIN CALCIUM 80 MG PO TABS: 80.0000 mg | ORAL_TABLET | Freq: Every day | ORAL | 3 refills | Status: DC

## 2016-07-14 ENCOUNTER — Ambulatory Visit (INDEPENDENT_AMBULATORY_CARE_PROVIDER_SITE_OTHER): Payer: Medicare Other

## 2016-07-14 DIAGNOSIS — Z23 Encounter for immunization: Secondary | ICD-10-CM

## 2016-07-21 ENCOUNTER — Other Ambulatory Visit: Payer: Self-pay | Admitting: Family Medicine

## 2016-07-21 NOTE — Telephone Encounter (Signed)
Refill done.  

## 2016-08-22 ENCOUNTER — Other Ambulatory Visit: Payer: Self-pay | Admitting: *Deleted

## 2016-08-22 MED ORDER — MELOXICAM 15 MG PO TABS: 15.0000 mg | ORAL_TABLET | Freq: Every day | ORAL | 0 refills | Status: DC

## 2016-08-22 NOTE — Telephone Encounter (Signed)
meloxicam sent into pharmacy.  

## 2016-09-04 DIAGNOSIS — R972 Elevated prostate specific antigen [PSA]: Secondary | ICD-10-CM | POA: Diagnosis not present

## 2016-09-11 DIAGNOSIS — R350 Frequency of micturition: Secondary | ICD-10-CM | POA: Diagnosis not present

## 2016-09-11 DIAGNOSIS — N401 Enlarged prostate with lower urinary tract symptoms: Secondary | ICD-10-CM | POA: Diagnosis not present

## 2016-09-11 DIAGNOSIS — R972 Elevated prostate specific antigen [PSA]: Secondary | ICD-10-CM | POA: Diagnosis not present

## 2016-09-30 ENCOUNTER — Telehealth: Payer: Self-pay | Admitting: General Practice

## 2016-09-30 NOTE — Telephone Encounter (Signed)
Last OV 02/29/16 (sciatica) Levothyroxine has never been on this patient med list from us, please advise

## 2016-09-30 NOTE — Telephone Encounter (Signed)
I have no record of him ever being on this.  I feel this may have come to us in error

## 2016-09-30 NOTE — Telephone Encounter (Signed)
Spoke with pt, he advised that he has never been on that medication. Even checked his meds while he was on the phone. Notation made and sent to mail order.

## 2016-10-23 ENCOUNTER — Encounter: Payer: Self-pay | Admitting: Family

## 2016-10-31 ENCOUNTER — Other Ambulatory Visit: Payer: Self-pay

## 2016-10-31 DIAGNOSIS — I6529 Occlusion and stenosis of unspecified carotid artery: Secondary | ICD-10-CM

## 2016-11-05 ENCOUNTER — Ambulatory Visit (INDEPENDENT_AMBULATORY_CARE_PROVIDER_SITE_OTHER): Payer: Medicare Other | Admitting: Family

## 2016-11-05 ENCOUNTER — Ambulatory Visit (HOSPITAL_COMMUNITY)
Admission: RE | Admit: 2016-11-05 | Discharge: 2016-11-05 | Disposition: A | Discharge status: Home / Self Care | Payer: Medicare Other | Source: Ambulatory Visit | Attending: Family | Admitting: Family

## 2016-11-05 ENCOUNTER — Encounter: Payer: Self-pay | Admitting: Family

## 2016-11-05 VITALS — BP 138/73 | HR 59 | Temp 97.9°F | Resp 16 | Ht 66.0 in | Wt 153.0 lb

## 2016-11-05 DIAGNOSIS — I6529 Occlusion and stenosis of unspecified carotid artery: Secondary | ICD-10-CM | POA: Diagnosis not present

## 2016-11-05 DIAGNOSIS — I6523 Occlusion and stenosis of bilateral carotid arteries: Secondary | ICD-10-CM

## 2016-11-05 NOTE — Progress Notes (Signed)
Chief Complaint: Follow up Extracranial Carotid Artery Stenosis   History of Present Illness  [name] is a 82 y.o. male patient that Dr. Edilia Bo has followed yearly for carotid artery surveillance since his cardiac stent placement and returns today for follow up.  He has not had previous carotid artery intervention.  Patient denies any recollection of PR VEIN BYPASS GRAFT,AORTO-FEM-POP in 2007 at any time (documented in his Past Surgical History), he has no abdominal surgical scar other than right mid abdomen.   He saw Dr. Mayford Knife for chest pain, had cardiac stent placed in 2006 or 2007 and had no further chest pain, denies hx of MI.  About September 2017 he had one ESI which resolved his right sciatic pain.  The patient denies claudication symptoms with walking.   He denies any history of TIA or stroke symptoms.Specifically he denies a history of amaurosis fugax or monocular blindness, unilateral facial drooping, hemiparesis, or receptive or expressive aphasia.   Pt Diabetic: No Pt smoker: non-smoker  Pt meds include: Statin : Yes Betablocker: Yes ASA: Yes Other anticoagulants/antiplatelets: no   Past Medical History:  Diagnosis Date  . Arthritis   . CAD (coronary artery disease) 2005   s/p PCI of RCA  . Carotid artery stenosis    40-59% left and < 40% right by dopplers 10/2015  . Cognitive impairment, mild, so stated   . Colitis   . DVT (deep venous thrombosis) (HCC)   . Erectile dysfunction   . GERD (gastroesophageal reflux disease)   . Hyperlipidemia   . Hypertension   . Peripheral vascular disease (HCC)   . Vertebrobasilar artery syndrome   . Vertigo     Social History Social History  Substance Use Topics  . Smoking status: Former Smoker    Quit date: 05/13/1961  . Smokeless tobacco: Never Used  . Alcohol use 0.0 oz/week     Comment: wine nightly    Family History Family History  Problem Relation Age of Onset  . Stroke Mother   .  Hyperlipidemia Mother   . Hypertension Mother   . Deep vein thrombosis Mother   . Stroke Father   . Hyperlipidemia Father   . Hypertension Father   . Deep vein thrombosis Father     Surgical History Past Surgical History:  Procedure Laterality Date  . APPENDECTOMY     Pt. was 82 yrs old  . PR VEIN BYPASS GRAFT,AORTO-FEM-POP  2007    No Known Allergies  Current Outpatient Prescriptions  Medication Sig Dispense Refill  . amLODipine (NORVASC) 5 MG tablet Take 1 tablet (5 mg total) by mouth daily. 30 tablet 11  . aspirin 81 MG tablet Take 81 mg by mouth daily.    Marland Kitchen atorvastatin (LIPITOR) 80 MG tablet Take 1 tablet (80 mg total) by mouth daily. 90 tablet 3  . carvedilol (COREG) 6.25 MG tablet Take 1 tablet (6.25 mg total) by mouth 2 (two) times daily. 180 tablet 1  . donepezil (ARICEPT) 10 MG tablet Take 10 mg by mouth daily.      . fish oil-omega-3 fatty acids 1000 MG capsule Take 1 g by mouth 2 (two) times daily.    Marland Kitchen gabapentin (NEURONTIN) 100 MG capsule TAKE 2 CAPSULES BY MOUTH AT BEDTIME 60 capsule 5  . hydrochlorothiazide (HYDRODIURIL) 25 MG tablet Take 1 tablet (25 mg total) by mouth daily. 90 tablet 2  . meloxicam (MOBIC) 15 MG tablet Take 1 tablet (15 mg total) by mouth daily. 90 tablet 0  No current facility-administered medications for this visit.     Review of Systems : See HPI for pertinent positives and negatives.  Physical Examination  Vitals:   11/05/16 1308 11/05/16 1311  BP: 140/72 138/73  Pulse: (!) 59 (!) 59  Resp: 16   Temp: 97.9 F (36.6 C)   SpO2: 96%   Weight: 153 lb (69.4 kg)   Height: 5\' 6"  (1.676 m)    Body mass index is 24.69 kg/m.  General: WDWN male in NAD GAIT: normal Eyes: PERRLA Pulmonary: Respirations are non labored, CTAB, no rales,  rhonchi, or wheezing.  Cardiac: regular rhythm, no detected murmur  VASCULAR EXAM Carotid Bruits Left Right   Negative Negative  Radial pulses are 2+ palpable and equal. Aorta  is not palpable.      LE Pulses LEFT RIGHT   POPLITEAL not palpable   not palpable   POSTERIOR TIBIAL  palpable   palpable    DORSALIS PEDIS  ANTERIOR TIBIAL not palpable  palpable     Gastrointestinal: soft, nontender, BS WNL, no r/g, no palpated masses.  Musculoskeletal: No muscle atrophy/wasting. M/S 5/5 throughout, Extremities without ischemic changes. No peripheral edema.  Neurologic: A&O X 3; Appropriate Affect ; SENSATION ;normal;  Speech is normal CN 2-12 intact, Pain and light touch intact in extremities, Motor exam as listed above     Assessment: [name] is a 82 y.o. male who has no history of stroke or TIA. He does have a history of CAD. Fortunately he does not have DM and has never used tobacco.    DATA  Today's carotid duplex suggests <40% bilateral ICA stenosis.  Bilateral vertebral artery flow is antegrade.  Bilateral subclavian artery waveforms are normal.  No significant change compared to the last exam on 11-02-15.   Plan: Follow-up in 2 years with Carotid Duplex scan.   I discussed in depth with the patient the nature of atherosclerosis, and emphasized the importance of maximal medical management including strict control of blood pressure, blood glucose, and lipid levels, obtaining regular exercise, and continued cessation of smoking.  The patient is aware that without maximal medical management the underlying atherosclerotic disease process will progress, limiting the benefit of any interventions. The patient was given information about stroke prevention and what symptoms should prompt the patient to seek immediate medical care. Thank you for allowing us to participate in this patient's care.  Charisse MarchSuzanne Tajh Livsey, RN, MSN, FNP-C [facility]  [place] Office: [phone]  Clinic Physician: Edilia BoDickson  11/05/16 1:13 PM

## 2016-11-05 NOTE — Patient Instructions (Signed)
Stroke Prevention Some medical conditions and behaviors are associated with an increased chance of having a stroke. You may prevent a stroke by making healthy choices and managing medical conditions. How can I reduce my risk of having a stroke?  Stay physically active. Get at least 30 minutes of activity on most or all days.  Do not smoke. It may also be helpful to avoid exposure to secondhand smoke.  Limit alcohol use. Moderate alcohol use is considered to be:  No more than 2 drinks per day for men.  No more than 1 drink per day for nonpregnant women.  Eat healthy foods. This involves:  Eating 5 or more servings of fruits and vegetables a day.  Making dietary changes that address high blood pressure (hypertension), high cholesterol, diabetes, or obesity.  Manage your cholesterol levels.  Making food choices that are high in fiber and low in saturated fat, trans fat, and cholesterol may control cholesterol levels.  Take any prescribed medicines to control cholesterol as directed by your health care provider.  Manage your diabetes.  Controlling your carbohydrate and sugar intake is recommended to manage diabetes.  Take any prescribed medicines to control diabetes as directed by your health care provider.  Control your hypertension.  Making food choices that are low in salt (sodium), saturated fat, trans fat, and cholesterol is recommended to manage hypertension.  Ask your health care provider if you need treatment to lower your blood pressure. Take any prescribed medicines to control hypertension as directed by your health care provider.  If you are 18-39 years of age, have your blood pressure checked every 3-5 years. If you are 40 years of age or older, have your blood pressure checked every year.  Maintain a healthy weight.  Reducing calorie intake and making food choices that are low in sodium, saturated fat, trans fat, and cholesterol are recommended to manage  weight.  Stop drug abuse.  Avoid taking birth control pills.  Talk to your health care provider about the risks of taking birth control pills if you are over 35 years old, smoke, get migraines, or have ever had a blood clot.  Get evaluated for sleep disorders (sleep apnea).  Talk to your health care provider about getting a sleep evaluation if you snore a lot or have excessive sleepiness.  Take medicines only as directed by your health care provider.  For some people, aspirin or blood thinners (anticoagulants) are helpful in reducing the risk of forming abnormal blood clots that can lead to stroke. If you have the irregular heart rhythm of atrial fibrillation, you should be on a blood thinner unless there is a good reason you cannot take them.  Understand all your medicine instructions.  Make sure that other conditions (such as anemia or atherosclerosis) are addressed. Get help right away if:  You have sudden weakness or numbness of the face, arm, or leg, especially on one side of the body.  Your face or eyelid droops to one side.  You have sudden confusion.  You have trouble speaking (aphasia) or understanding.  You have sudden trouble seeing in one or both eyes.  You have sudden trouble walking.  You have dizziness.  You have a loss of balance or coordination.  You have a sudden, severe headache with no known cause.  You have new chest pain or an irregular heartbeat. Any of these symptoms may represent a serious problem that is an emergency. Do not wait to see if the symptoms will go away.   Get medical help at once. Call your local emergency services (911 in [place] Do not drive yourself to the hospital. This information is not intended to replace advice given to you by your health care provider. Make sure you discuss any questions you have with your health care provider. Document Released: 11/20/2004 Document Revised: 03/20/2016 Document Reviewed: 04/15/2013 Elsevier  Interactive Patient Education  2017 Elsevier Inc.  

## 2016-11-06 NOTE — Addendum Note (Signed)
Addended by: Burton ApleyPETTY, Cleon Thoma A on: 11/06/2016 12:22 PM   Modules accepted: Orders

## 2016-12-02 DIAGNOSIS — G3184 Mild cognitive impairment, so stated: Secondary | ICD-10-CM | POA: Diagnosis not present

## 2016-12-12 ENCOUNTER — Other Ambulatory Visit: Payer: Self-pay | Admitting: *Deleted

## 2016-12-12 MED ORDER — CARVEDILOL 6.25 MG PO TABS: 6.2500 mg | ORAL_TABLET | Freq: Two times a day (BID) | ORAL | 0 refills | Status: DC

## 2016-12-16 ENCOUNTER — Telehealth: Payer: Self-pay | Admitting: Cardiology

## 2016-12-16 NOTE — Telephone Encounter (Signed)
Spoke with patient and made him aware that his refill was sent in on 12/12/16. He stated that he was aware of this and that [facility] had told him that they were processing it. His complaint was that it was not approved with 3 refills. I made him aware that refills are approved based on when a patient is due for a one year follow up. He appreciated my return call.  Approved    Disp Refills Start End  carvedilol (COREG) 6.25 MG tablet 180 tablet 0 12/12/2016   Sig - Route:  Take 1 tablet (6.25 mg total) by mouth 2 (two) times daily. - Oral  Class:  Normal  DAW:  No  Authorizing Provider:  Quintella Reichertraci R Turner, MD  Ordering User:  Awilda BillBrandy J Carden, CMA  Visit Pharmacy   [facility] [place], [place] - [address]

## 2016-12-16 NOTE — Telephone Encounter (Signed)
NEW MESSAGE   *STAT* If patient is at the pharmacy, call can be transferred to refill team.   1. Which medications need to be refilled? (please list name of each medication and dose if known) CARVEDILOL   2. Which pharmacy/location (including street and city if local pharmacy) is medication to be sent to? [facility]  3. Do they need a 30 day or 90 day supply? PT DIDN'T SAY

## 2016-12-23 DIAGNOSIS — H25013 Cortical age-related cataract, bilateral: Secondary | ICD-10-CM | POA: Diagnosis not present

## 2016-12-23 DIAGNOSIS — H1859 Other hereditary corneal dystrophies: Secondary | ICD-10-CM | POA: Diagnosis not present

## 2016-12-23 DIAGNOSIS — H01021 Squamous blepharitis right upper eyelid: Secondary | ICD-10-CM | POA: Diagnosis not present

## 2016-12-23 DIAGNOSIS — H16223 Keratoconjunctivitis sicca, not specified as Sjogren's, bilateral: Secondary | ICD-10-CM | POA: Diagnosis not present

## 2017-01-15 ENCOUNTER — Other Ambulatory Visit: Payer: Self-pay | Admitting: Family Medicine

## 2017-03-09 DIAGNOSIS — R972 Elevated prostate specific antigen [PSA]: Secondary | ICD-10-CM | POA: Diagnosis not present

## 2017-03-16 DIAGNOSIS — R972 Elevated prostate specific antigen [PSA]: Secondary | ICD-10-CM | POA: Diagnosis not present

## 2017-03-16 DIAGNOSIS — N400 Enlarged prostate without lower urinary tract symptoms: Secondary | ICD-10-CM | POA: Diagnosis not present

## 2017-03-18 ENCOUNTER — Other Ambulatory Visit: Payer: Self-pay | Admitting: Family Medicine

## 2017-03-18 ENCOUNTER — Other Ambulatory Visit: Payer: Self-pay | Admitting: Cardiology

## 2017-03-18 MED ORDER — CARVEDILOL 6.25 MG PO TABS: 6.2500 mg | ORAL_TABLET | Freq: Two times a day (BID) | ORAL | 0 refills | Status: DC

## 2017-03-18 NOTE — Telephone Encounter (Signed)
Refill done.  

## 2017-03-30 NOTE — Progress Notes (Signed)
Cardiology Office Note    Date:  03/31/2017   ID:  [name], DOB 1935-02-21, MRN [id number]  PCP:  Sheliah Hatchabori, Katherine E, MD  Cardiologist:  Armanda Magicraci Kista Robb, MD   Chief Complaint  Patient presents with  . Coronary Artery Disease  . Hypertension  . Hyperlipidemia    History of Present Illness:  [name] is a 82 y.o. male with a history of ASCAD s/p PCI of the RCA, HTN, Carotid artery stenosis (40-59% left and < 40% right by dopplers 10/2015) and dyslipidemia.  He is here today for followup and is doing well. He denies any chest pain or pressure, SOB, DOE, LE edema, dizziness, palpitations or syncope.  He has not had any PND, or orthopnea.    Past Medical History:  Diagnosis Date  . Arthritis   . CAD (coronary artery disease) 2005   s/p PCI of RCA  . Carotid artery stenosis    40-59% left and < 40% right by dopplers 10/2015  . Cognitive impairment, mild, so stated   . Colitis   . DVT (deep venous thrombosis) (HCC)   . Erectile dysfunction   . GERD (gastroesophageal reflux disease)   . Hyperlipidemia   . Hypertension   . Peripheral vascular disease (HCC)   . Vertebrobasilar artery syndrome   . Vertigo     Past Surgical History:  Procedure Laterality Date  . APPENDECTOMY     Pt. was 82 yrs old  . PR VEIN BYPASS GRAFT,AORTO-FEM-POP  2007    Current Medications: Current Meds  Medication Sig  . amLODipine (NORVASC) 5 MG tablet Take 1 tablet (5 mg total) by mouth daily.  Marland Kitchen. aspirin 81 MG tablet Take 81 mg by mouth daily.  Marland Kitchen. atorvastatin (LIPITOR) 80 MG tablet Take 1 tablet (80 mg total) by mouth daily.  . carvedilol (COREG) 6.25 MG tablet Take 1 tablet (6.25 mg total) by mouth 2 (two) times daily.  Marland Kitchen. donepezil (ARICEPT) 10 MG tablet Take 10 mg by mouth daily.    . fish oil-omega-3 fatty acids 1000 MG capsule Take 1 g by mouth 2 (two) times daily.  Marland Kitchen. gabapentin (NEURONTIN) 100 MG capsule TAKE 2 CAPSULES BY MOUTH AT BEDTIME  . hydrochlorothiazide (HYDRODIURIL) 25 MG  tablet Take 1 tablet (25 mg total) by mouth daily.  . meloxicam (MOBIC) 15 MG tablet Take 1 tablet (15 mg total) by mouth daily.    Allergies:   Patient has no known allergies.   Social History   Social History  . Marital status: Married    Spouse name: N/A  . Number of children: N/A  . Years of education: N/A   Social History Main Topics  . Smoking status: Former Smoker    Quit date: 05/13/1961  . Smokeless tobacco: Never Used  . Alcohol use 0.0 oz/week     Comment: wine nightly  . Drug use: No  . Sexual activity: Not Asked   Other Topics Concern  . None   Social History Narrative  . None     Family History:  The patient's family history includes Deep vein thrombosis in his father and mother; Hyperlipidemia in his father and mother; Hypertension in his father and mother; Stroke in his father and mother.   ROS:   Please see the history of present illness.    ROS All other systems reviewed and are negative.  No flowsheet data found.     PHYSICAL EXAM:   VS:  BP 134/60   Pulse (!) 59  Ht 5\' 6"  (1.676 m)   Wt 146 lb (66.2 kg)   BMI 23.57 kg/m    GEN: Well nourished, well developed, in no acute distress  HEENT: normal  Neck: no JVD, carotid bruits, or masses Cardiac: RRR; no murmurs, rubs, or gallops,no edema.  Intact distal pulses bilaterally.  Respiratory:  clear to auscultation bilaterally, normal work of breathing GI: soft, nontender, nondistended, + BS MS: no deformity or atrophy  Skin: warm and dry, no rash Neuro:  Alert and Oriented x 3, Strength and sensation are intact Psych: euthymic mood, full affect  Wt Readings from Last 3 Encounters:  03/31/17 146 lb (66.2 kg)  11/05/16 153 lb (69.4 kg)  05/28/16 152 lb (68.9 kg)      Studies/Labs Reviewed:   EKG:  EKG is ordered today.  The ekg ordered today demonstrates sinus bradycardia at 59bpm with no ST changes and PAC  Recent Labs: 03/31/2016: BUN 21; Creat 0.92; Potassium 4.3; Sodium  139 05/14/2016: ALT 22   Lipid Panel    Component Value Date/Time   CHOL 106 (L) 05/14/2016 0816   TRIG 118 05/14/2016 0816   HDL 44 05/14/2016 0816   CHOLHDL 2.4 05/14/2016 0816   VLDL 24 05/14/2016 0816   LDLCALC 38 05/14/2016 0816    Additional studies/ records that were reviewed today include:  none    ASSESSMENT:    1. Coronary artery disease involving native coronary artery of native heart without angina pectoris   2. Bilateral carotid artery stenosis   3. Essential hypertension   4. Pure hypercholesterolemia      PLAN:  In order of problems listed above:  1. ASCAD s/p PCI of the RCA - he denies any recent anginal symptoms. He will continue on  ASA 81mg  daily, BB and high dose statin.  He walks for exericise.   2. Bilateral carotid artery stenosis (1-39% bilateral by dopplers 10/2016).  3. HTN - his BP is well controlled on exam today.  He will continue on amlodipine 5mg  daily, HCTZ 25mg  daily and carvedilol 6.25mg  BID. I will check a BMET.  4. Hyperlipidemia with LDL goal < 70.  He will continue on atorvastatin 80mg  daily.  I will check an FLP and ALT.      Medication Adjustments/Labs and Tests Ordered: Current medicines are reviewed at length with the patient today.  Concerns regarding medicines are outlined above.  Medication changes, Labs and Tests ordered today are listed in the Patient Instructions below.  Patient Instructions  Medication Instructions:  Your physician recommends that you continue on your current medications as directed. Please refer to the Current Medication list given to you today.   Labwork: TODAY: BMET, LFTs, Lipids  Testing/Procedures: None  Follow-Up: Your physician wants you to follow-up in: 1 year with Dr. Mayford Knife. You will receive a reminder letter in the mail two months in advance. If you don't receive a letter, please call our office to schedule the follow-up appointment.   Any Other Special Instructions Will Be Listed Below  (If Applicable).     If you need a refill on your cardiac medications before your next appointment, please call your pharmacy.      Signed, Armanda Magic, MD  03/31/2017 9:15 AM    [facility] [address] [place], [place], [place]  [address] Phone: [phone]; Fax: [phone]

## 2017-03-31 ENCOUNTER — Ambulatory Visit (INDEPENDENT_AMBULATORY_CARE_PROVIDER_SITE_OTHER): Payer: Medicare Other | Admitting: Cardiology

## 2017-03-31 ENCOUNTER — Encounter: Payer: Self-pay | Admitting: Cardiology

## 2017-03-31 VITALS — BP 134/60 | HR 59 | Ht 66.0 in | Wt 146.0 lb

## 2017-03-31 DIAGNOSIS — I2510 Atherosclerotic heart disease of native coronary artery without angina pectoris: Secondary | ICD-10-CM | POA: Diagnosis not present

## 2017-03-31 DIAGNOSIS — I10 Essential (primary) hypertension: Secondary | ICD-10-CM

## 2017-03-31 DIAGNOSIS — I6523 Occlusion and stenosis of bilateral carotid arteries: Secondary | ICD-10-CM | POA: Diagnosis not present

## 2017-03-31 DIAGNOSIS — E7800 Pure hypercholesterolemia, unspecified: Secondary | ICD-10-CM | POA: Diagnosis not present

## 2017-03-31 LAB — BASIC METABOLIC PANEL
BUN/Creatinine Ratio: 21 (ref 10–24)
BUN: 21 mg/dL (ref 8–27)
CALCIUM: 9 mg/dL (ref 8.6–10.2)
CO2: 26 mmol/L (ref 18–29)
Chloride: 99 mmol/L (ref 96–106)
Creatinine, Ser: 0.98 mg/dL (ref 0.76–1.27)
GFR, EST AFRICAN AMERICAN: 84 mL/min/{1.73_m2} (ref >59)
GFR, EST NON AFRICAN AMERICAN: 73 mL/min/{1.73_m2} (ref >59)
Glucose: 86 mg/dL (ref 65–99)
POTASSIUM: 3.9 mmol/L (ref 3.5–5.2)
Sodium: 140 mmol/L (ref 134–144)

## 2017-03-31 LAB — LIPID PANEL
CHOL/HDL RATIO: 2.4 ratio (ref 0.0–5.0)
Cholesterol, Total: 112 mg/dL (ref 100–199)
HDL: 47 mg/dL (ref >39)
LDL Calculated: 44 mg/dL (ref 0–99)
Triglycerides: 105 mg/dL (ref 0–149)
VLDL CHOLESTEROL CAL: 21 mg/dL (ref 5–40)

## 2017-03-31 LAB — HEPATIC FUNCTION PANEL
ALBUMIN: 4.3 g/dL (ref 3.5–4.8)
ALT: 20 IU/L (ref 0–44)
AST: 23 IU/L (ref 0–40)
Alkaline Phosphatase: 94 IU/L (ref 39–117)
BILIRUBIN TOTAL: 0.4 mg/dL (ref 0.0–1.2)
BILIRUBIN, DIRECT: 0.11 mg/dL (ref 0.00–0.40)
Total Protein: 6.5 g/dL (ref 6.0–8.5)

## 2017-03-31 NOTE — Patient Instructions (Signed)

## 2017-04-14 ENCOUNTER — Other Ambulatory Visit: Payer: Self-pay | Admitting: *Deleted

## 2017-04-14 MED ORDER — CARVEDILOL 6.25 MG PO TABS: 6.2500 mg | ORAL_TABLET | Freq: Two times a day (BID) | ORAL | 3 refills | Status: DC

## 2017-04-22 ENCOUNTER — Telehealth: Payer: Self-pay | Admitting: *Deleted

## 2017-04-22 MED ORDER — MELOXICAM 15 MG PO TABS: 15.0000 mg | ORAL_TABLET | Freq: Every day | ORAL | 0 refills | Status: DC

## 2017-04-22 NOTE — Telephone Encounter (Signed)
Mail order pharmacy left msg on triage requesting renewal on pt [name] 90 day supply. Last ov w/Dr. Katrinka BlazingSmith 05/2016. Pls advise if ok to refill...[name]/lmb

## 2017-05-17 ENCOUNTER — Other Ambulatory Visit: Payer: Self-pay | Admitting: Family Medicine

## 2017-05-18 ENCOUNTER — Telehealth: Payer: Self-pay | Admitting: Cardiology

## 2017-05-18 NOTE — Telephone Encounter (Signed)
Walk In pt Form-Prescription Renewal-dropped off placed in Turner doc box/Km

## 2017-05-19 NOTE — Telephone Encounter (Signed)
Refill done.  

## 2017-05-20 ENCOUNTER — Other Ambulatory Visit: Payer: Self-pay | Admitting: Cardiology

## 2017-05-20 MED ORDER — HYDROCHLOROTHIAZIDE 25 MG PO TABS: 25.0000 mg | ORAL_TABLET | Freq: Every day | ORAL | 3 refills | Status: DC

## 2017-06-09 ENCOUNTER — Other Ambulatory Visit: Payer: Self-pay | Admitting: *Deleted

## 2017-06-09 MED ORDER — AMLODIPINE BESYLATE 5 MG PO TABS: 5.0000 mg | ORAL_TABLET | Freq: Every day | ORAL | 2 refills | Status: DC

## 2017-06-18 ENCOUNTER — Other Ambulatory Visit: Payer: Self-pay | Admitting: Family Medicine

## 2017-06-18 NOTE — Telephone Encounter (Signed)
Refill denied. Pt needs an appt. 

## 2017-06-26 ENCOUNTER — Other Ambulatory Visit: Payer: Self-pay | Admitting: *Deleted

## 2017-06-26 MED ORDER — ATORVASTATIN CALCIUM 80 MG PO TABS: 80.0000 mg | ORAL_TABLET | Freq: Every day | ORAL | 2 refills | Status: DC

## 2017-07-20 ENCOUNTER — Ambulatory Visit (INDEPENDENT_AMBULATORY_CARE_PROVIDER_SITE_OTHER): Payer: Medicare Other

## 2017-07-20 DIAGNOSIS — Z23 Encounter for immunization: Secondary | ICD-10-CM | POA: Diagnosis not present

## 2017-08-01 NOTE — Progress Notes (Signed)
[name] [address] [place], [place] [address] Phone: [phone] Subjective:    I'm seeing this patient by the request  of:    CC: Back pain follow-up  MWU:[id number]  [name] is a 82 y.o. male coming in with complaint of low back pain. Seen greater than one year ago. Found to have lumbar spinal stenosis. Responded well to an epidural. He has been taking the gabapentin which seems to be helping. He would like to get the gabapentin refilled. He has pain once in a while in his back.   . Continues to be very active. Patient does go to a lot of sporting events.   patient did have an MRI. This was independently visualized by me. Showed significant bilateral foraminal narrowing with significant encroachment at L3, L4 and L5 nerve roots. Multilevel spinal stenosis. Epidural was L3-L4 that was beneficial.  Past Medical History:  Diagnosis Date  . Arthritis   . CAD (coronary artery disease) 2005   s/p PCI of RCA  . Carotid artery stenosis    40-59% left and < 40% right by dopplers 10/2015  . Cognitive impairment, mild, so stated   . Colitis   . DVT (deep venous thrombosis) (HCC)   . Erectile dysfunction   . GERD (gastroesophageal reflux disease)   . Hyperlipidemia   . Hypertension   . Peripheral vascular disease (HCC)   . Vertebrobasilar artery syndrome   . Vertigo    Past Surgical History:  Procedure Laterality Date  . APPENDECTOMY     Pt. was 82 yrs old  . PR VEIN BYPASS GRAFT,AORTO-FEM-POP  2007   Social History   Social History  . Marital status: Married    Spouse name: N/A  . Number of children: N/A  . Years of education: N/A   Social History Main Topics  . Smoking status: Former Smoker    Quit date: 05/13/1961  . Smokeless tobacco: Never Used  . Alcohol use 0.0 oz/week     Comment: wine nightly  . Drug use: No  . Sexual activity: Not Asked   Other Topics Concern  . None   Social History Narrative  . None   No Known  Allergies Family History  Problem Relation Age of Onset  . Stroke Mother   . Hyperlipidemia Mother   . Hypertension Mother   . Deep vein thrombosis Mother   . Stroke Father   . Hyperlipidemia Father   . Hypertension Father   . Deep vein thrombosis Father      Past medical history, social, surgical and family history all reviewed in electronic medical record.  No pertanent information unless stated regarding to the chief complaint.   Review of Systems:Review of systems updated and as accurate as of 08/03/17  No headache, visual changes, nausea, vomiting, diarrhea, constipation, dizziness, abdominal pain, skin rash, fevers, chills, night sweats, weight loss, swollen lymph nodes, body aches, joint swelling, chest pain, shortness of breath, mood changes. Mild positive muscle aches  Objective  Blood pressure 140/80, pulse 69, height  (1.676 m), weight 145 lb (65.8 kg), SpO2 97 %. Systems examined below as of 08/03/17   General: No apparent distress alert and oriented x3 mood and affect normal, dressed appropriately.  HEENT: Pupils equal, extraocular movements intact  Respiratory: Patient's speak in full sentences and does not appear short of breath  Cardiovascular: No lower extremity edema, non tender, no erythema  Skin: Warm dry intact with no signs of infection or  rash on extremities or on axial skeleton.  Abdomen: Soft nontender  Neuro: Cranial nerves II through XII are intact, neurovascularly intact in all extremities with 2+ DTRs and 2+ pulses.  Lymph: No lymphadenopathy of posterior or anterior cervical chain or axillae bilaterally.  Gait Antalgic MSK:  Non tender with full range of motion and good stability and symmetric strength and tone of shoulders, elbows, wrist, hip, knee and ankles bilaterally. Arthritic changes of multiple joints Back Exam:  Inspection: Mild loss of lordosis Motion: Flexion 35 deg, Extension 15 deg, Side Bending to 35 deg bilaterally,  Rotation to 35  deg bilaterally  SLR laying: Negative  XSLR laying: Negative  Palpable tenderness: Tender to palpation in the paraspinal musculature. FABER: Tightness bilaterally. Sensory change: Gross sensation intact to all lumbar and sacral dermatomes.  Reflexes: 2+ at both patellar tendons, 2+ at achilles tendons, Babinski's downgoing.  Strength at foot  Plantar-flexion: 5/5 Dorsi-flexion: 5/5 Eversion: 5/5 Inversion: 5/5  Leg strength  Quad: 5/5 Hamstring: 5/5 Hip flexor: 5/5 Hip abductors: 5/5       Impression and Recommendations:     This case required medical decision making of moderate complexity.      Note: This dictation was prepared with Dragon dictation along with smaller phrase technology. Any transcriptional errors that result from this process are unintentional.

## 2017-08-03 ENCOUNTER — Encounter: Payer: Self-pay | Admitting: Family Medicine

## 2017-08-03 ENCOUNTER — Ambulatory Visit (INDEPENDENT_AMBULATORY_CARE_PROVIDER_SITE_OTHER): Payer: Medicare Other | Admitting: Family Medicine

## 2017-08-03 DIAGNOSIS — M48062 Spinal stenosis, lumbar region with neurogenic claudication: Secondary | ICD-10-CM

## 2017-08-03 MED ORDER — GABAPENTIN 100 MG PO CAPS
200.0000 mg | ORAL_CAPSULE | Freq: Every day | ORAL | 3 refills | Status: DC
Start: 2017-08-03 — End: 2018-10-12

## 2017-08-03 NOTE — Assessment & Plan Note (Signed)
Doing very well to this time. Has responded to epidural in the past. Continue the gabapentin. Follow-up nearly as needed.

## 2017-08-03 NOTE — Patient Instructions (Signed)
Good to see you  You are doing great  Ice is your friend.  Continue the gabapentin  Hopefully see you again in 1 year

## 2017-09-08 ENCOUNTER — Ambulatory Visit: Payer: Medicare Other | Admitting: Family Medicine

## 2017-09-08 ENCOUNTER — Telehealth: Payer: Self-pay | Admitting: *Deleted

## 2017-09-08 ENCOUNTER — Encounter: Payer: Self-pay | Admitting: Family Medicine

## 2017-09-08 ENCOUNTER — Other Ambulatory Visit: Payer: Self-pay

## 2017-09-08 VITALS — BP 123/64 | HR 78 | Temp 98.1°F | Resp 16 | Ht 66.0 in | Wt 146.1 lb

## 2017-09-08 DIAGNOSIS — J189 Pneumonia, unspecified organism: Secondary | ICD-10-CM | POA: Diagnosis not present

## 2017-09-08 DIAGNOSIS — L97511 Non-pressure chronic ulcer of other part of right foot limited to breakdown of skin: Secondary | ICD-10-CM

## 2017-09-08 DIAGNOSIS — L03031 Cellulitis of right toe: Secondary | ICD-10-CM | POA: Diagnosis not present

## 2017-09-08 MED ORDER — AMOXICILLIN-POT CLAVULANATE 875-125 MG PO TABS: 1.0000 | ORAL_TABLET | Freq: Two times a day (BID) | ORAL | 0 refills | Status: DC

## 2017-09-08 NOTE — Progress Notes (Signed)
   Subjective:    Patient ID: [name], male    DOB: [birth year], 82 y.o.   MRN: [id number]  HPI Cellulitis- R great toe.  Pt has large bunion that that podiatrist told him to wear a toe spacer for.  Developed a blister between 1st and 2nd toe ~1 week ago.  Redness is now spreading across great toe.  Painful to touch.  Some drainage from ulcer.  Cough- sxs started ~1 week ago.  Denies SOB.  No fevers.  No hx of COPD.  Cough is wet but not productive.     Review of Systems For ROS see HPI     Objective:   Physical Exam  Constitutional: He is oriented to person, place, and time. He appears well-developed and well-nourished. No distress.  HENT:  Head: Normocephalic and atraumatic.  Right Ear: Tympanic membrane normal.  Left Ear: Tympanic membrane normal.  Nose: No mucosal edema or rhinorrhea. Right sinus exhibits no maxillary sinus tenderness and no frontal sinus tenderness. Left sinus exhibits no maxillary sinus tenderness and no frontal sinus tenderness.  Mouth/Throat: Mucous membranes are normal. No oropharyngeal exudate, posterior oropharyngeal edema or posterior oropharyngeal erythema.  Eyes: Conjunctivae and EOM are normal. Pupils are equal, round, and reactive to light.  Neck: Normal range of motion. Neck supple.  Cardiovascular: Normal rate, regular rhythm and normal heart sounds.  Pulmonary/Chest: Effort normal. No respiratory distress. He has no wheezes.  + hacking cough CTA on L, coarse BS and rhonchi in R lung field  Lymphadenopathy:    He has no cervical adenopathy.  Neurological: He is alert and oriented to person, place, and time.  Skin: Skin is warm and dry. There is erythema (of R great toe w/ macerated ulcer of medial IP joint- culture collected).  Vitals reviewed.         Assessment & Plan:  Pneumonia- given pt's coarse BS and rhonchi (even in absence of fever) I am concerned for possible PNA.  Will start Augmentin to cover both his pulmonary process and  cellulitis.  Cough meds prn.  Reviewed supportive care and red flags that should prompt return.  If no improvement, will get CXR.  Cellulitis- new.  Pt w/ obvious infxn of R great toe.  Some pus expressed and culture collected.  Start Augmentin.  Reviewed supportive care and red flags that should prompt return.  Pt expressed understanding and is in agreement w/ plan.   Skin ulcer- new.  Given foot deformity from bunion will refer to Podiatry for complete evaluation and assessment for possible osteo (although ulcer seems very superficial).  Will follow closely.

## 2017-09-08 NOTE — Patient Instructions (Signed)
Schedule a BP and cholesterol followup next month START the Augmentin twice daily- take w/ food- for both the foot and the lung infection Coricidin HBP for cough and congestion Keep wound clean and DRY!   We'll call you with your Podiatry appt- this is VERY important! Call with any questions or concerns- particularly if the wound worsens or doesn't improve Call with any questions or concerns Hang in there!!!

## 2017-09-08 NOTE — Telephone Encounter (Signed)
Per Cody Martin, PA, change dMalva Coganressings 2x's daily until seeing Podiatry.   I have spoken to his wife to let her know.  She stated verbal understanding.     Copied from CRM [phone]. Topic: General - Other >> Sep 08, 2017  2:53 PM Stephannie LiSimmons, Janett L, NT wrote: Reason for CRM: Patients wife wants to knew how often to change husbands dressing they were in earlier today

## 2017-09-11 ENCOUNTER — Ambulatory Visit: Payer: Self-pay | Admitting: Podiatry

## 2017-09-11 LAB — WOUND CULTURE
MICRO NUMBER:: 81279002
SPECIMEN QUALITY:: ADEQUATE

## 2017-09-15 ENCOUNTER — Ambulatory Visit (INDEPENDENT_AMBULATORY_CARE_PROVIDER_SITE_OTHER): Payer: Medicare Other

## 2017-09-15 ENCOUNTER — Encounter: Payer: Self-pay | Admitting: Podiatry

## 2017-09-15 ENCOUNTER — Ambulatory Visit: Payer: Medicare Other | Admitting: Podiatry

## 2017-09-15 VITALS — BP 141/77 | HR 72 | Resp 16

## 2017-09-15 DIAGNOSIS — L97511 Non-pressure chronic ulcer of other part of right foot limited to breakdown of skin: Secondary | ICD-10-CM

## 2017-09-15 MED ORDER — AMOXICILLIN-POT CLAVULANATE 500-125 MG PO TABS: 1.0000 | ORAL_TABLET | Freq: Two times a day (BID) | ORAL | 0 refills | Status: DC

## 2017-09-15 NOTE — Progress Notes (Signed)
Presents today with chief complaint of pain to the lateral aspect of the right hallux. States the results primary care doctor after he developed a blister turned red and started draining. He states that she put him on Augmentin and the infection appears to be clearing up.  Objective: Vital signs are stable alert and oriented 3. Pulses are strongly palpable right. 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 Radiographs do not demonstrate any type of osseus abnormalities of the hallux valgus. There is no skin breakdown the results in osteomyelitis.  Assessment: Cellulitis hallux right with abscess.  Plan: Debridement of the wound today dressing after application of antibiotic ointment dressing compressive dressing was applied and demonstrated this to the patient. Started him back on Augmentin. I will follow-up with him in 2 weeks.

## 2017-09-15 NOTE — Progress Notes (Signed)
Subjective:  Patient ID: [name], male    DOB: Dec 10, 1934,  MRN: [id number] HPI Chief Complaint  Patient presents with  . Toe Pain    Hallux right - callused area that started to get infected 1 week ago, better today but still tender, PCP rx'd augmentin    82 y.o. male presents with the above complaint.     Past Medical History:  Diagnosis Date  . Arthritis   . CAD (coronary artery disease) 2005   s/p PCI of RCA  . Carotid artery stenosis    40-59% left and < 40% right by dopplers 10/2015  . Cognitive impairment, mild, so stated   . Colitis   . DVT (deep venous thrombosis) (HCC)   . Erectile dysfunction   . GERD (gastroesophageal reflux disease)   . Hyperlipidemia   . Hypertension   . Peripheral vascular disease (HCC)   . Vertebrobasilar artery syndrome   . Vertigo    Past Surgical History:  Procedure Laterality Date  . APPENDECTOMY     Pt. was [age] yrs old  . PR VEIN BYPASS GRAFT,AORTO-FEM-POP  2007    Current Outpatient Medications:  .  amLODipine (NORVASC) 5 MG tablet, Take 1 tablet (5 mg total) by mouth daily., Disp: 90 tablet, Rfl: 2 .  amoxicillin-clavulanate (AUGMENTIN) 875-125 MG tablet, Take 1 tablet 2 (two) times daily by mouth., Disp: 20 tablet, Rfl: 0 .  aspirin 81 MG tablet, Take 81 mg by mouth daily., Disp: , Rfl:  .  atorvastatin (LIPITOR) 80 MG tablet, Take 1 tablet (80 mg total) by mouth daily., Disp: 90 tablet, Rfl: 2 .  carvedilol (COREG) 6.25 MG tablet, Take 1 tablet (6.25 mg total) by mouth 2 (two) times daily., Disp: 180 tablet, Rfl: 3 .  donepezil (ARICEPT) 10 MG tablet, Take 10 mg by mouth daily.  , Disp: , Rfl:  .  fish oil-omega-3 fatty acids 1000 MG capsule, Take 1 g by mouth 2 (two) times daily., Disp: , Rfl:  .  gabapentin (NEURONTIN) 100 MG capsule, Take 2 capsules (200 mg total) by mouth at bedtime., Disp: 180 capsule, Rfl: 3 .  hydrochlorothiazide (HYDRODIURIL) 25 MG tablet, Take 1 tablet (25 mg total) by mouth daily., Disp: 90 tablet,  Rfl: 3 .  meloxicam (MOBIC) 15 MG tablet, Take 1 tablet (15 mg total) by mouth daily., Disp: 90 tablet, Rfl: 0  No Known Allergies Review of Systems  All other systems reviewed and are negative.  Objective:   Vitals:   09/15/17 1446  BP: (!) 141/77  Pulse: 72  Resp: 16    General: Well developed, nourished, in no acute distress, alert and oriented x3   Dermatological: Skin is warm, dry and supple bilateral. Nails x 10 are well maintained; remaining integument appears unremarkable at this time. There are no open sores, no preulcerative lesions, no rash or signs of infection present.  Vascular: Dorsalis Pedis artery and Posterior Tibial artery pedal pulses are 2/4 bilateral with immedate capillary fill time. Pedal hair growth present. No varicosities and no lower extremity edema present bilateral.   Neruologic: Grossly intact via light touch bilateral. Vibratory intact via tuning fork bilateral. Protective threshold with Semmes Wienstein monofilament intact to all pedal sites bilateral. Patellar and Achilles deep tendon reflexes 2+ bilateral. No Babinski or clonus noted bilateral.   Musculoskeletal: No gross boney pedal deformities bilateral. No pain, crepitus, or limitation noted with foot and ankle range of motion bilateral. Muscular strength 5/5 in all groups tested bilateral.  Gait:  Unassisted, Nonantalgic.    Radiographs:    Assessment & Plan:   Assessment:   Plan:      Max T. NavarreHyatt, North DakotaDPM

## 2017-09-29 ENCOUNTER — Encounter: Payer: Self-pay | Admitting: Podiatry

## 2017-09-29 ENCOUNTER — Ambulatory Visit: Payer: Medicare Other | Admitting: Podiatry

## 2017-09-29 DIAGNOSIS — Q828 Other specified congenital malformations of skin: Secondary | ICD-10-CM

## 2017-09-29 DIAGNOSIS — L97511 Non-pressure chronic ulcer of other part of right foot limited to breakdown of skin: Secondary | ICD-10-CM

## 2017-09-29 NOTE — Progress Notes (Signed)
He presents today for follow-up of ulceration to the lateral aspect of the hallux right.  He states that he completed his Augmentin and has been keeping a dressing on the toe.  Fever chills nausea vomiting muscle aches and pains.  States that it feels like is doing better.  Objective: Vital signs are stable he is alert and oriented x3.  The wound site is gone on to heal uneventfully there is no erythema edema cellulitis drainage or odor.  One small area of reactive hyperkeratosis I trimmed today.  No open lesions or wounds are noted.  Assessment: Well-healing ulceration lateral aspect right foot.  Well-healing infection.  Plan: Follow up with me on an as-needed basis.  Continue to utilize spacer.

## 2017-10-08 ENCOUNTER — Encounter: Payer: Self-pay | Admitting: Family Medicine

## 2017-10-08 ENCOUNTER — Other Ambulatory Visit: Payer: Self-pay

## 2017-10-08 ENCOUNTER — Ambulatory Visit: Payer: Medicare Other | Admitting: Family Medicine

## 2017-10-08 VITALS — BP 126/82 | HR 68 | Temp 98.1°F | Resp 16 | Ht 66.0 in | Wt 146.4 lb

## 2017-10-08 DIAGNOSIS — E7800 Pure hypercholesterolemia, unspecified: Secondary | ICD-10-CM | POA: Diagnosis not present

## 2017-10-08 DIAGNOSIS — I10 Essential (primary) hypertension: Secondary | ICD-10-CM | POA: Diagnosis not present

## 2017-10-08 DIAGNOSIS — H6121 Impacted cerumen, right ear: Secondary | ICD-10-CM | POA: Diagnosis not present

## 2017-10-08 LAB — CBC WITH DIFFERENTIAL/PLATELET
Basophils Absolute: 0.1 10*3/uL (ref 0.0–0.1)
Basophils Relative: 1.1 % (ref 0.0–3.0)
EOS PCT: 5 % (ref 0.0–5.0)
Eosinophils Absolute: 0.4 10*3/uL (ref 0.0–0.7)
HEMATOCRIT: 40 % (ref 39.0–52.0)
Hemoglobin: 13.2 g/dL (ref 13.0–17.0)
LYMPHS ABS: 2 10*3/uL (ref 0.7–4.0)
Lymphocytes Relative: 27.6 % (ref 12.0–46.0)
MCHC: 33 g/dL (ref 30.0–36.0)
MCV: 89.7 fl (ref 78.0–100.0)
MONOS PCT: 9.1 % (ref 3.0–12.0)
Monocytes Absolute: 0.7 10*3/uL (ref 0.1–1.0)
NEUTROS ABS: 4.2 10*3/uL (ref 1.4–7.7)
NEUTROS PCT: 57.2 % (ref 43.0–77.0)
Platelets: 229 10*3/uL (ref 150.0–400.0)
RBC: 4.46 Mil/uL (ref 4.22–5.81)
RDW: 15.3 % (ref 11.5–15.5)
WBC: 7.3 10*3/uL (ref 4.0–10.5)

## 2017-10-08 LAB — BASIC METABOLIC PANEL
BUN: 20 mg/dL (ref 6–23)
CO2: 31 mEq/L (ref 19–32)
Calcium: 8.9 mg/dL (ref 8.4–10.5)
Chloride: 102 mEq/L (ref 96–112)
Creatinine, Ser: 1.15 mg/dL (ref 0.40–1.50)
GFR: 64.98 mL/min (ref >60.00)
Glucose, Bld: 94 mg/dL (ref 70–99)
POTASSIUM: 4.6 meq/L (ref 3.5–5.1)
SODIUM: 139 meq/L (ref 135–145)

## 2017-10-08 LAB — HEPATIC FUNCTION PANEL
ALBUMIN: 4.2 g/dL (ref 3.5–5.2)
ALK PHOS: 71 U/L (ref 39–117)
ALT: 20 U/L (ref 0–53)
AST: 25 U/L (ref 0–37)
BILIRUBIN DIRECT: 0.1 mg/dL (ref 0.0–0.3)
Total Bilirubin: 0.7 mg/dL (ref 0.2–1.2)
Total Protein: 6.5 g/dL (ref 6.0–8.3)

## 2017-10-08 LAB — LIPID PANEL
Cholesterol: 127 mg/dL (ref 0–200)
HDL: 50.8 mg/dL (ref >39.00)
LDL CALC: 52 mg/dL (ref 0–99)
NonHDL: 76.51
Total CHOL/HDL Ratio: 3
Triglycerides: 124 mg/dL (ref 0.0–149.0)
VLDL: 24.8 mg/dL (ref 0.0–40.0)

## 2017-10-08 LAB — TSH: TSH: 3.28 u[IU]/mL (ref 0.35–4.50)

## 2017-10-08 NOTE — Patient Instructions (Addendum)
Schedule your complete physical in 6 months We'll notify you of your lab results and make any changes if needed Saturate a cotton ball with peroxide and drip into the ear to dissolve the wax in the R ear Keep up the good work!  You look great!!! Call with any questions or concerns Happy Holidays!!!

## 2017-10-08 NOTE — Assessment & Plan Note (Signed)
Chronic problem.  Tolerating statin w/o difficulty.  Check labs.  Adjust meds prn  

## 2017-10-08 NOTE — Progress Notes (Signed)
   Subjective:    Patient ID: [name], male    DOB: 07-07-1935, 82 y.o.   MRN: [id number]  HPI HTN- chronic problem, on Amlodipine 5mg  daily, Coreg 6.25mg  BID, and HCTZ 25mg  daily w/ good control.  Pt continues to walk regularly.  No CP, SOB, HAs, visual changes, edema.  Hyperlipidemia- chronic problem, on Lipitor 80 and fish oil daily.  No abd pain, N/V.  Decreased hearing- wife reports she has to 'tell him things more than once before he responds'.  Pt reports that he can hear her, he just doesn't respond.   Review of Systems For ROS see HPI      Objective:   Physical Exam  Constitutional: He is oriented to person, place, and time. He appears well-developed and well-nourished. No distress.  HENT:  Head: Normocephalic and atraumatic.  R ear w/ cerumen impaction- unsuccessful w/ curette and alligator forceps.  Ear was irrigated but no successful wax return  Eyes: Conjunctivae and EOM are normal. Pupils are equal, round, and reactive to light.  Neck: Normal range of motion. Neck supple. No thyromegaly present.  Cardiovascular: Normal rate, regular rhythm, normal heart sounds and intact distal pulses.  No murmur heard. Pulmonary/Chest: Effort normal and breath sounds normal. No respiratory distress.  Abdominal: Soft. Bowel sounds are normal. He exhibits no distension.  Musculoskeletal: He exhibits no edema.  Lymphadenopathy:    He has no cervical adenopathy.  Neurological: He is alert and oriented to person, place, and time. No cranial nerve deficit.  Skin: Skin is warm and dry.  Psychiatric: He has a normal mood and affect. His behavior is normal.  Vitals reviewed.         Assessment & Plan:

## 2017-10-08 NOTE — Assessment & Plan Note (Signed)
R ear.  Was unable to remove w/ curette, alligator forceps, or irrigation.  Pt to soften wax w/ peroxide and return if needed

## 2017-10-08 NOTE — Assessment & Plan Note (Signed)
Chronic problem.  Well controlled today.  Asymptomatic.  Check labs.  No anticipated med changes.  Will follow. 

## 2017-10-09 ENCOUNTER — Encounter: Payer: Self-pay | Admitting: General Practice

## 2017-12-24 DIAGNOSIS — H01022 Squamous blepharitis right lower eyelid: Secondary | ICD-10-CM | POA: Diagnosis not present

## 2017-12-24 DIAGNOSIS — H25043 Posterior subcapsular polar age-related cataract, bilateral: Secondary | ICD-10-CM | POA: Diagnosis not present

## 2017-12-24 DIAGNOSIS — H25013 Cortical age-related cataract, bilateral: Secondary | ICD-10-CM | POA: Diagnosis not present

## 2017-12-24 DIAGNOSIS — H01021 Squamous blepharitis right upper eyelid: Secondary | ICD-10-CM | POA: Diagnosis not present

## 2018-01-14 ENCOUNTER — Encounter: Payer: Self-pay | Admitting: Internal Medicine

## 2018-01-14 ENCOUNTER — Other Ambulatory Visit: Payer: Self-pay

## 2018-01-14 ENCOUNTER — Encounter: Payer: Self-pay | Admitting: Family Medicine

## 2018-01-14 ENCOUNTER — Ambulatory Visit: Payer: Medicare Other | Admitting: Family Medicine

## 2018-01-14 VITALS — BP 118/64 | HR 57 | Temp 97.9°F | Resp 16 | Ht 66.0 in | Wt 144.0 lb

## 2018-01-14 DIAGNOSIS — R1319 Other dysphagia: Secondary | ICD-10-CM

## 2018-01-14 DIAGNOSIS — R053 Chronic cough: Secondary | ICD-10-CM

## 2018-01-14 DIAGNOSIS — R05 Cough: Secondary | ICD-10-CM | POA: Diagnosis not present

## 2018-01-14 DIAGNOSIS — R1310 Dysphagia, unspecified: Secondary | ICD-10-CM

## 2018-01-14 DIAGNOSIS — J3489 Other specified disorders of nose and nasal sinuses: Secondary | ICD-10-CM

## 2018-01-14 MED ORDER — OMEPRAZOLE 20 MG PO CPDR: 20.0000 mg | DELAYED_RELEASE_CAPSULE | Freq: Every day | ORAL | 3 refills | Status: DC

## 2018-01-14 MED ORDER — FLUTICASONE PROPIONATE 50 MCG/ACT NA SUSP: 2.0000 | Freq: Every day | NASAL | 6 refills | Status: DC

## 2018-01-14 NOTE — Patient Instructions (Addendum)
Follow up as needed or as scheduled START the Flonase- 2 sprays each nostril daily CONTINUE the Zyrtec We'll call you with your GI appt for the trouble swallowing and cough START the Omeprazole daily to decrease acid production (this can cause narrowing of throat) If your nasal symptoms don't improve after 3-4 weeks, let me know and we can put in the ENT referral Call with any questions or concerns Hang in there!

## 2018-01-14 NOTE — Progress Notes (Signed)
   Subjective:    Patient ID: [name], male    DOB: [birth year], 83 y.o.   MRN: [id number]  HPI Cough- occurs after eating, 'I cough a long time and I can just tell there's something in my throat'.  'my nose runs constantly'.  Cough sounds wet but is not productive.  Cough has been ongoing since Nov.  Denies GERD.   Review of Systems For ROS see HPI     Objective:   Physical Exam  Constitutional: He appears well-developed and well-nourished. No distress.  HENT:  Head: Normocephalic and atraumatic.  No TTP over sinuses + turbinate edema- near occlusion of R nostril + PND  Eyes: Pupils are equal, round, and reactive to light. Conjunctivae and EOM are normal.  Neck: Normal range of motion. Neck supple.  Cardiovascular: Normal rate, regular rhythm and normal heart sounds.  Pulmonary/Chest: Effort normal and breath sounds normal. No respiratory distress. He has no wheezes.  No cough heard  Lymphadenopathy:    He has no cervical adenopathy.  Neurological: He is alert.  Skin: Skin is warm and dry.  Psychiatric: He has a normal mood and affect. His behavior is normal. Thought content normal.  Vitals reviewed.         Assessment & Plan:  Chronic cough- ongoing issue for pt.  I suspect this is multifactorial- PND, GERD, possible esophageal stricture (although we must also consider aspiration).  I feel aspiration is less likely b/c he is not ill.  Start nasal steroid.  Continue daily antihistamine.  Start daily PPI.  Refer to GI to assess for possible stricture.  If no stricture, may need a swallow evaluation.  Rhinorrhea- add nasal steroid.  R nostril nearly occluded.  If no relief w/ nasal steroid, will refer to ENT.  Pt expressed understanding and is in agreement w/ plan.

## 2018-03-10 ENCOUNTER — Encounter

## 2018-03-10 ENCOUNTER — Ambulatory Visit: Payer: Medicare Other | Admitting: Internal Medicine

## 2018-03-10 ENCOUNTER — Other Ambulatory Visit (HOSPITAL_COMMUNITY): Payer: Self-pay | Admitting: Internal Medicine

## 2018-03-10 ENCOUNTER — Encounter: Payer: Self-pay | Admitting: Internal Medicine

## 2018-03-10 ENCOUNTER — Ambulatory Visit (INDEPENDENT_AMBULATORY_CARE_PROVIDER_SITE_OTHER)
Admission: RE | Admit: 2018-03-10 | Discharge: 2018-03-10 | Disposition: A | Discharge status: Home / Self Care | Payer: Medicare Other | Source: Ambulatory Visit | Attending: Internal Medicine | Admitting: Internal Medicine

## 2018-03-10 VITALS — BP 118/58 | HR 66 | Ht 66.0 in | Wt 146.0 lb

## 2018-03-10 DIAGNOSIS — J3489 Other specified disorders of nose and nasal sinuses: Secondary | ICD-10-CM | POA: Diagnosis not present

## 2018-03-10 DIAGNOSIS — R062 Wheezing: Secondary | ICD-10-CM | POA: Diagnosis not present

## 2018-03-10 DIAGNOSIS — R05 Cough: Secondary | ICD-10-CM | POA: Diagnosis not present

## 2018-03-10 DIAGNOSIS — R053 Chronic cough: Secondary | ICD-10-CM

## 2018-03-10 DIAGNOSIS — R0982 Postnasal drip: Secondary | ICD-10-CM | POA: Diagnosis not present

## 2018-03-10 DIAGNOSIS — R1319 Other dysphagia: Secondary | ICD-10-CM

## 2018-03-10 NOTE — Progress Notes (Signed)
CXR ok My Chart message

## 2018-03-10 NOTE — Progress Notes (Signed)
[name] 82 y.o. November 19, 1934 [id number] Referred by: Sheliah Hatch, MD  Assessment & Plan:   Encounter Diagnoses  Name Primary?  . Chronic cough Yes  . Bilateral wheezing   . Post-nasal drip   . Rhinorrhea    Schedule modified barium swallow.  I wonder if he is having some sort of aspiration problem.  He could be having microaspiration.  Though esophageal stricture and GERD are in the differential I think that is less likely from the history I am getting.  Chest x-ray for the bilateral wheezing and a chronic cough.  PA and lateral.  DG Chest 2 View CLINICAL DATA:  Cough after eating 4 months ago.  EXAM: CHEST - 2 VIEW  COMPARISON:  None.  FINDINGS: The heart size and mediastinal contours are within normal limits. Both lungs are clear. The visualized skeletal structures are unremarkable.  IMPRESSION: No active cardiopulmonary disease.  Electronically Signed   By: Elige Ko   On: 03/10/2018 16:06 \  Depending upon what the show he could need pulmonary or ENT, as this has played out I am starting to have some concern for possible upper airway problem or nasopharyngeal issues. He did have findings of this on last PCP exam. Esophageal problem is in differential also but seems lower on list to me as he has coughing with/after eating but I am not geeting a good story for impact dysphagia. If MBS unhelpful then EGD is next or if it points [place] toward that..  Also note donepezil can cause esophageal dysmotility and lead to problems though he has been on this a long time and the symptoms are new  I appreciate the opportunity to care for this patient.  WU:[id number], Helane Rima, MD    Subjective:   Chief Complaint: cough  HPI The patient is here with his wife, at the request of Dr. Beverely Low, because of a chronic cough after eating.  His wife says he has not really been well since November when he suffered with allergies and perhaps bronchitis and may be almost  pneumonia.  He been coughing quite a bit often after eating but not only.  If he eats something he feels like there is something in the back of his throat, typically solid foods any variety and he will cough and then it will seem to clear.  Along with this he has had a lot of rhinorrhea and postnasal drip and wheezing.  Dr. Beverely Low was wondering if he might not have a stricture.  He does not have heartburn.  She tried a PPI.  He has persistent problems.  He was treated with Augmentin for possible pneumonia in November.  In March she saw him, wet cough not productive.  Because of the rhinorrhea and a right nostril that was nearly occluded he was started on a nasal steroid. No Known Allergies Current Meds  Medication Sig  . amLODipine (NORVASC) 5 MG tablet Take 1 tablet (5 mg total) by mouth daily.  Marland Kitchen aspirin 81 MG tablet Take 81 mg by mouth daily.  Marland Kitchen atorvastatin (LIPITOR) 80 MG tablet Take 1 tablet (80 mg total) by mouth daily.  . carvedilol (COREG) 6.25 MG tablet Take 1 tablet (6.25 mg total) by mouth 2 (two) times daily.  . cetirizine (ZYRTEC) 10 MG tablet Take 10 mg by mouth daily.  Marland Kitchen donepezil (ARICEPT) 10 MG tablet Take 10 mg by mouth daily.    . fish oil-omega-3 fatty acids 1000 MG capsule Take 1 g by mouth 2 (  two) times daily.  . fluticasone (FLONASE) 50 MCG/ACT nasal spray Place 2 sprays into both nostrils daily.  Marland Kitchen gabapentin (NEURONTIN) 100 MG capsule Take 2 capsules (200 mg total) by mouth at bedtime.  . hydrochlorothiazide (HYDRODIURIL) 25 MG tablet Take 1 tablet (25 mg total) by mouth daily.  . meloxicam (MOBIC) 15 MG tablet Take 1 tablet (15 mg total) by mouth daily.  Marland Kitchen omeprazole (PRILOSEC) 20 MG capsule Take 1 capsule (20 mg total) by mouth daily.   Past Medical History:  Diagnosis Date  . Arthritis   . CAD (coronary artery disease) 2005   s/p PCI of RCA  . Carotid artery stenosis    40-59% left and < 40% right by dopplers 10/2015  . Cognitive impairment, mild, so stated   .  Colitis   . DVT (deep venous thrombosis) (HCC)   . Erectile dysfunction   . GERD (gastroesophageal reflux disease)   . Hyperlipidemia   . Hypertension   . Peripheral vascular disease (HCC)   . Vertebrobasilar artery syndrome   . Vertigo    Past Surgical History:  Procedure Laterality Date  . APPENDECTOMY     Pt. was 83 yrs old  . COLONOSCOPY    . PR VEIN BYPASS GRAFT,AORTO-FEM-POP  2007   Social History   Social History Narrative   Married with 1 son and 2 daughters.  Retired.   2 caffeinated beverages daily, 3 glasses of wine daily   Never smoker no drug use no tobacco   family history includes Deep vein thrombosis in his father and mother; Diverticulitis in his father; Hyperlipidemia in his father and mother; Hypertension in his father and mother; Stroke in his father and mother; Ulcerative colitis in his father.   Review of Systems As per HPI and mild memory disturbance All other ROS negative  Objective:   Physical Exam  (!) 118/58   Pulse 66   Ht  (1.676 m)   Wt 146 lb (66.2 kg)   BMI 23.57 kg/m @  General:  Well-developed, well-nourished and in no acute distress, elderly Eyes:  anicteric. ENT:   Mouth and posterior pharynx free of lesions.  Neck:   supple w/o thyromegaly or mass.  Lungs: Diffuse bilateral mainly expiratory wheezing/rhonchi anterior and posterior Heart:  S1S2, no rubs, murmurs, gallops.  Distant Abdomen:  soft, non-tender, no hepatosplenomegaly, hernia, or mass and BS+.  Lymph:  no cervical or supraclavicular adenopathy. Extremities:   no edema, cyanosis or clubbing Skin   no rash. Neuro:  A&O x 3.  Psych:  appropriate mood and  Affect.   Data Reviewed: See HPI  But recent 2019 PCP notes and labs all reviewed

## 2018-03-10 NOTE — Patient Instructions (Signed)
  Please go to the basement and get a chest x-ray done today before leaving.   You have been scheduled for a modified barium swallow on 03/16/18 at 11:00AM. Please arrive 15 minutes prior to your test for registration. You will go to [facility] (1st Floor) for your appointment. Please refrain from eating or drinking anything 4 hours prior to your test. Should you need to cancel or reschedule your appointment, please contact [phone] [facility]) or [phone] [facility]). _____________________________________________________________________ A Modified Barium Swallow Study, or MBS, is a special x-ray that is taken to check swallowing skills. It is carried out by a Marine scientist and a Warehouse manager (SLP). During this test, yourmouth, throat, and esophagus, a muscular tube which connects your mouth to your stomach, is checked. The test will help you, your doctor, and the SLP plan what types of foods and liquids are easier for you to swallow. The SLP will also identify positions and ways to help you swallow more easily and safely. What will happen during an MBS? You will be taken to an x-ray room and seated comfortably. You will be asked to swallow small amounts of food and liquid mixed with barium. Barium is a liquid or paste that allows images of your mouth, throat and esophagus to be seen on x-ray. The x-ray captures moving images of the food you are swallowing as it travels from your mouth through your throat and into your esophagus. This test helps identify whether food or liquid is entering your lungs (aspiration). The test also shows which part of your mouth or throat lacks strength or coordination to move the food or liquid in the right direction. This test typically takes 30 minutes to 1 hour to complete. _______________________________________________________________________  I appreciate the opportunity to care for you. Stan Head, MD, [facility]

## 2018-03-13 ENCOUNTER — Encounter: Payer: Self-pay | Admitting: Internal Medicine

## 2018-03-16 ENCOUNTER — Ambulatory Visit (HOSPITAL_COMMUNITY)
Admission: RE | Admit: 2018-03-16 | Discharge: 2018-03-16 | Disposition: A | Discharge status: Home / Self Care | Payer: Medicare Other | Source: Ambulatory Visit | Attending: Internal Medicine | Admitting: Internal Medicine

## 2018-03-16 DIAGNOSIS — R1319 Other dysphagia: Secondary | ICD-10-CM | POA: Insufficient documentation

## 2018-03-16 DIAGNOSIS — R1310 Dysphagia, unspecified: Secondary | ICD-10-CM | POA: Diagnosis not present

## 2018-03-16 DIAGNOSIS — R053 Chronic cough: Secondary | ICD-10-CM

## 2018-03-16 DIAGNOSIS — R05 Cough: Secondary | ICD-10-CM | POA: Insufficient documentation

## 2018-03-17 NOTE — Progress Notes (Signed)
[name], See Dr. Rennis Golden note and explain to patient  Essentially hard to know which is next best test - we are working through the issue  Schedule EGD and possible dilation if he wants  If not would do an ENT referral

## 2018-03-17 NOTE — Progress Notes (Signed)
Sheri,  MBS did not give [place] the answer  Please set up an EGD, possible dilation dx cough, dysphagia  OK to use a 730 if we need to not tomorrow though  I have cced his PCP also

## 2018-03-17 NOTE — Progress Notes (Signed)
Understand his point  It has sounded like he might have some swallowing disturbance - admittedly not sure  Speech path had suggested we evaluate the esophagus further which is main reason I sent the message  I do wonder if he did aspirate since he coughed up some barium later (if we are sure he coughed it up)  The test he had does not evaluate the entire esophagus (MBS)  We could schedule a dedicated barium swallow instead  I am ccing his PCP Dr. Beverely Low - let's hold on scheduling and get her input also -   Other options include seeing ENT vs pulmonary (CXR was clear)  We do need to pursue other evaluation and could wait on the esophageal testing if she wants to do something else first - my rec would be to see ENT next

## 2018-03-18 NOTE — Progress Notes (Signed)
Patient will se Dr. Jearld Fenton - await that consult

## 2018-03-30 ENCOUNTER — Other Ambulatory Visit: Payer: Self-pay | Admitting: Cardiology

## 2018-03-30 DIAGNOSIS — R05 Cough: Secondary | ICD-10-CM | POA: Diagnosis not present

## 2018-03-30 DIAGNOSIS — J324 Chronic pansinusitis: Secondary | ICD-10-CM | POA: Diagnosis not present

## 2018-04-07 DIAGNOSIS — J324 Chronic pansinusitis: Secondary | ICD-10-CM | POA: Diagnosis not present

## 2018-04-07 DIAGNOSIS — J329 Chronic sinusitis, unspecified: Secondary | ICD-10-CM | POA: Diagnosis not present

## 2018-04-07 DIAGNOSIS — J339 Nasal polyp, unspecified: Secondary | ICD-10-CM | POA: Diagnosis not present

## 2018-04-09 ENCOUNTER — Encounter: Payer: Medicare Other | Admitting: Family Medicine

## 2018-04-14 ENCOUNTER — Encounter: Payer: Self-pay | Admitting: Family Medicine

## 2018-04-14 ENCOUNTER — Other Ambulatory Visit: Payer: Self-pay

## 2018-04-14 ENCOUNTER — Ambulatory Visit (INDEPENDENT_AMBULATORY_CARE_PROVIDER_SITE_OTHER): Payer: Medicare Other | Admitting: Family Medicine

## 2018-04-14 VITALS — BP 116/60 | HR 76 | Temp 98.0°F | Resp 16 | Ht 66.0 in | Wt 145.0 lb

## 2018-04-14 DIAGNOSIS — I739 Peripheral vascular disease, unspecified: Secondary | ICD-10-CM | POA: Diagnosis not present

## 2018-04-14 DIAGNOSIS — Z0000 Encounter for general adult medical examination without abnormal findings: Secondary | ICD-10-CM

## 2018-04-14 DIAGNOSIS — E7800 Pure hypercholesterolemia, unspecified: Secondary | ICD-10-CM | POA: Diagnosis not present

## 2018-04-14 LAB — HEPATIC FUNCTION PANEL
ALT: 18 U/L (ref 0–53)
AST: 25 U/L (ref 0–37)
Albumin: 4.6 g/dL (ref 3.5–5.2)
Alkaline Phosphatase: 86 U/L (ref 39–117)
BILIRUBIN DIRECT: 0.1 mg/dL (ref 0.0–0.3)
TOTAL PROTEIN: 7.3 g/dL (ref 6.0–8.3)
Total Bilirubin: 0.7 mg/dL (ref 0.2–1.2)

## 2018-04-14 LAB — CBC WITH DIFFERENTIAL/PLATELET
BASOS ABS: 0.1 10*3/uL (ref 0.0–0.1)
BASOS PCT: 1.2 % (ref 0.0–3.0)
EOS ABS: 0.2 10*3/uL (ref 0.0–0.7)
Eosinophils Relative: 2.6 % (ref 0.0–5.0)
HEMATOCRIT: 38.5 % — AB (ref 39.0–52.0)
Hemoglobin: 12.7 g/dL — ABNORMAL LOW (ref 13.0–17.0)
LYMPHS ABS: 1.7 10*3/uL (ref 0.7–4.0)
LYMPHS PCT: 28.6 % (ref 12.0–46.0)
MCHC: 33.1 g/dL (ref 30.0–36.0)
MCV: 77.6 fl — ABNORMAL LOW (ref 78.0–100.0)
MONO ABS: 0.6 10*3/uL (ref 0.1–1.0)
Monocytes Relative: 9.5 % (ref 3.0–12.0)
NEUTROS ABS: 3.5 10*3/uL (ref 1.4–7.7)
NEUTROS PCT: 58.1 % (ref 43.0–77.0)
PLATELETS: 221 10*3/uL (ref 150.0–400.0)
RBC: 4.96 Mil/uL (ref 4.22–5.81)
RDW: 18.9 % — AB (ref 11.5–15.5)
WBC: 6.1 10*3/uL (ref 4.0–10.5)

## 2018-04-14 LAB — BASIC METABOLIC PANEL
BUN: 20 mg/dL (ref 6–23)
CALCIUM: 9.5 mg/dL (ref 8.4–10.5)
CHLORIDE: 99 meq/L (ref 96–112)
CO2: 31 meq/L (ref 19–32)
CREATININE: 0.99 mg/dL (ref 0.40–1.50)
GFR: 77.14 mL/min (ref >60.00)
Glucose, Bld: 91 mg/dL (ref 70–99)
Potassium: 3.8 mEq/L (ref 3.5–5.1)
SODIUM: 139 meq/L (ref 135–145)

## 2018-04-14 LAB — LIPID PANEL
Cholesterol: 152 mg/dL (ref 0–200)
HDL: 55.4 mg/dL (ref >39.00)
LDL Cholesterol: 72 mg/dL (ref 0–99)
NonHDL: 96.84
Total CHOL/HDL Ratio: 3
Triglycerides: 126 mg/dL (ref 0.0–149.0)
VLDL: 25.2 mg/dL (ref 0.0–40.0)

## 2018-04-14 LAB — TSH: TSH: 2.84 u[IU]/mL (ref 0.35–4.50)

## 2018-04-14 MED ORDER — DONEPEZIL HCL 10 MG PO TABS: 10.0000 mg | ORAL_TABLET | Freq: Every day | ORAL | 0 refills | Status: DC

## 2018-04-14 NOTE — Progress Notes (Signed)
   Subjective:    Patient ID: [name], male    DOB: 05-29-1935, 83 y.o.   MRN: [id number]  HPI CPE- UTD on GI (no need for colonoscopy), immunizations.  UTD on urology.   Review of Systems Patient reports no vision/hearing changes, anorexia, fever ,adenopathy, persistant/recurrent hoarseness, swallowing issues, chest pain, palpitations, edema, hemoptysis, dyspnea (rest,exertional, paroxysmal nocturnal), gastrointestinal  bleeding (melena, rectal bleeding), abdominal pain, excessive heart burn, GU symptoms (dysuria, hematuria, voiding/incontinence issues) syncope, focal weakness, memory loss, numbness & tingling, skin/hair/nail changes, depression, anxiety, abnormal bruising/bleeding, musculoskeletal symptoms/signs.   + chronic cough- seeing ENT    Objective:   Physical Exam General Appearance:    Alert, cooperative, no distress, appears stated age  Head:    Normocephalic, without obvious abnormality, atraumatic  Eyes:    PERRL, conjunctiva/corneas clear, EOM's intact, fundi    benign, both eyes       Ears:    Normal TM's and external ear canals, both ears  Nose:   Nares normal, septum midline, mucosa normal, no drainage   or sinus tenderness  Throat:   Lips, mucosa, and tongue normal; teeth and gums normal  Neck:   Supple, symmetrical, trachea midline, no adenopathy;       thyroid:  No enlargement/tenderness/nodules  Back:     Symmetric, no curvature, ROM normal, no CVA tenderness  Lungs:     Clear to auscultation bilaterally, respirations unlabored  Chest wall:    No tenderness or deformity  Heart:    Regular rate and rhythm, S1 and S2 normal, no murmur, rub   or gallop  Abdomen:     Soft, non-tender, bowel sounds active all four quadrants,    no masses, no organomegaly  Genitalia:    Deferred to urology  Rectal:    Extremities:   Extremities normal, atraumatic, no cyanosis or edema  Pulses:   2+ and symmetric all extremities  Skin:   Skin color, texture, turgor normal, no  rashes or lesions  Lymph nodes:   Cervical, supraclavicular, and axillary nodes normal  Neurologic:   CNII-XII intact. Normal strength, sensation and reflexes      throughout          Assessment & Plan:

## 2018-04-14 NOTE — Assessment & Plan Note (Signed)
Following w/ [facility]

## 2018-04-14 NOTE — Assessment & Plan Note (Signed)
Pt's PE unchanged from previous and WNL.  UTD on immunizations and urology.  Check labs.  Anticipatory guidance provided.

## 2018-04-14 NOTE — Patient Instructions (Signed)
Follow up in 6 months to recheck BP and cholesterol We'll notify you of your lab results and make any changes if needed Good luck w/ your ENT surgery! Call with any questions or concerns Enjoy the beach!!!

## 2018-04-14 NOTE — Assessment & Plan Note (Signed)
Chronic problem.  Tolerating statin w/o difficulty.  Check labs.  Adjust meds prn  

## 2018-04-15 ENCOUNTER — Encounter: Payer: Self-pay | Admitting: General Practice

## 2018-04-16 ENCOUNTER — Encounter: Payer: Self-pay | Admitting: Cardiology

## 2018-04-16 ENCOUNTER — Ambulatory Visit: Payer: Medicare Other | Admitting: Cardiology

## 2018-04-16 VITALS — BP 136/60 | HR 52 | Ht 66.0 in | Wt 144.8 lb

## 2018-04-16 DIAGNOSIS — I6523 Occlusion and stenosis of bilateral carotid arteries: Secondary | ICD-10-CM | POA: Diagnosis not present

## 2018-04-16 DIAGNOSIS — I10 Essential (primary) hypertension: Secondary | ICD-10-CM

## 2018-04-16 DIAGNOSIS — E7800 Pure hypercholesterolemia, unspecified: Secondary | ICD-10-CM | POA: Diagnosis not present

## 2018-04-16 DIAGNOSIS — I2510 Atherosclerotic heart disease of native coronary artery without angina pectoris: Secondary | ICD-10-CM

## 2018-04-16 DIAGNOSIS — Z01810 Encounter for preprocedural cardiovascular examination: Secondary | ICD-10-CM | POA: Diagnosis not present

## 2018-04-16 LAB — BASIC METABOLIC PANEL
BUN/Creatinine Ratio: 18 (ref 10–24)
BUN: 19 mg/dL (ref 8–27)
CALCIUM: 9.4 mg/dL (ref 8.6–10.2)
CO2: 26 mmol/L (ref 20–29)
CREATININE: 1.07 mg/dL (ref 0.76–1.27)
Chloride: 101 mmol/L (ref 96–106)
GFR calc Af Amer: 75 mL/min/{1.73_m2} (ref >59)
GFR, EST NON AFRICAN AMERICAN: 65 mL/min/{1.73_m2} (ref >59)
Glucose: 95 mg/dL (ref 65–99)
POTASSIUM: 4.1 mmol/L (ref 3.5–5.2)
Sodium: 142 mmol/L (ref 134–144)

## 2018-04-16 NOTE — Patient Instructions (Signed)
Medication Instructions:  Your physician recommends that you continue on your current medications as directed. Please refer to the Current Medication list given to you today.  If you need a refill on your cardiac medications, please contact your pharmacy first.  Labwork: Today for kidney function test   Testing/Procedures: None ordered   Follow-Up: Your physician wants you to follow-up in: 1 year with Dr. Turner. You will receive a reminder letter in the mail two months in advance. If you don't receive a letter, please call our office to schedule the follow-up appointment.  Any Other Special Instructions Will Be Listed Below (If Applicable).   Thank you for choosing [facility]    Rena Sueanne Maniaci, RN  [phone]  If you need a refill on your cardiac medications before your next appointment, please call your pharmacy.   

## 2018-04-16 NOTE — Progress Notes (Signed)
Cardiology Office Note:    Date:  04/16/2018   ID:  [name], DOB [birth year], MRN [id number]  PCP:  Sheliah Hatch, MD  Cardiologist:  No primary care provider on file.    Referring MD: Sheliah Hatch, MD   Chief Complaint  Patient presents with  . Coronary Artery Disease  . Hypertension  . Hyperlipidemia    History of Present Illness:    [name] is a 83 y.o. male with a hx of ASCAD s/p PCI of the RCA, HTN, Carotid artery stenosis and dyslipidemia.  he is here today for followup and is doing well.  He denies any chest pain or pressure, SOB, DOE, PND, orthopnea, LE edema, dizziness, palpitations or syncope. He is compliant with his meds and is tolerating meds with no SE.    Past Medical History:  Diagnosis Date  . Arthritis   . CAD (coronary artery disease) 2005   s/p PCI of RCA  . Carotid artery stenosis    1-39% bilateral by dopplers 10/2016  . Cognitive impairment, mild, so stated   . Colitis   . DVT (deep venous thrombosis) (HCC)   . Erectile dysfunction   . GERD (gastroesophageal reflux disease)   . Hyperlipidemia   . Hypertension   . Peripheral vascular disease (HCC)   . Vertebrobasilar artery syndrome   . Vertigo     Past Surgical History:  Procedure Laterality Date  . APPENDECTOMY     Pt. was 83 yrs old  . COLONOSCOPY    . PR VEIN BYPASS GRAFT,AORTO-FEM-POP  2007    Current Medications: Current Meds  Medication Sig  . amLODipine (NORVASC) 5 MG tablet Take 1 tablet (5 mg total) by mouth daily. Please keep upcoming appt in June with Dr. Mayford Knife for future refills. Thank you  . aspirin 81 MG tablet Take 81 mg by mouth daily.  Marland Kitchen atorvastatin (LIPITOR) 80 MG tablet Take 1 tablet (80 mg total) by mouth daily.  . carvedilol (COREG) 6.25 MG tablet Take 1 tablet (6.25 mg total) by mouth 2 (two) times daily.  . cetirizine (ZYRTEC) 10 MG tablet Take 10 mg by mouth daily.  Marland Kitchen donepezil (ARICEPT) 10 MG tablet Take 1 tablet (10 mg total) by mouth daily.    . fish oil-omega-3 fatty acids 1000 MG capsule Take 1 g by mouth 2 (two) times daily.  . fluticasone (FLONASE) 50 MCG/ACT nasal spray Place 2 sprays into both nostrils daily.  Marland Kitchen gabapentin (NEURONTIN) 100 MG capsule Take 2 capsules (200 mg total) by mouth at bedtime.  . hydrochlorothiazide (HYDRODIURIL) 25 MG tablet Take 1 tablet (25 mg total) by mouth daily.  . meloxicam (MOBIC) 15 MG tablet Take 1 tablet (15 mg total) by mouth daily.  Marland Kitchen omeprazole (PRILOSEC) 20 MG capsule Take 1 capsule (20 mg total) by mouth daily.     Allergies:   Patient has no known allergies.   Social History   Socioeconomic History  . Marital status: Married    Spouse name: Not on file  . Number of children: Not on file  . Years of education: Not on file  . Highest education level: Not on file  Occupational History  . Not on file  Social Needs  . Financial resource strain: Not on file  . Food insecurity:    Worry: Not on file    Inability: Not on file  . Transportation needs:    Medical: Not on file    Non-medical: Not on file  Tobacco Use  .  Smoking status: Former Smoker    Last attempt to quit: 05/13/1961    Years since quitting: 56.9  . Smokeless tobacco: Never Used  Substance and Sexual Activity  . Alcohol use: Yes    Alcohol/week: 0.0 oz    Comment: wine nightly  . Drug use: No  . Sexual activity: Not on file  Lifestyle  . Physical activity:    Days per week: Not on file    Minutes per session: Not on file  . Stress: Not on file  Relationships  . Social connections:    Talks on phone: Not on file    Gets together: Not on file    Attends religious service: Not on file    Active member of club or organization: Not on file    Attends meetings of clubs or organizations: Not on file    Relationship status: Not on file  Other Topics Concern  . Not on file  Social History Narrative   Married with 1 son and 2 daughters.  Retired.   2 caffeinated beverages daily, 3 glasses of wine daily    Never smoker no drug use no tobacco     Family History: The patient's family history includes Deep vein thrombosis in his father and mother; Diverticulitis in his father; Hyperlipidemia in his father and mother; Hypertension in his father and mother; Stroke in his father and mother; Ulcerative colitis in his father. There is no history of Colon cancer.  ROS:   Please see the history of present illness.    ROS  All other systems reviewed and negative.   EKGs/Labs/Other Studies Reviewed:    The following studies were reviewed today: none  EKG:  EKG is  ordered today.  The ekg ordered today demonstrates normal sinus rhythm at 64 bpm with occasional PACs.  Recent Labs: 04/14/2018: ALT 18; BUN 20; Creatinine, Ser 0.99; Hemoglobin 12.7; Platelets 221.0; Potassium 3.8; Sodium 139; TSH 2.84   Recent Lipid Panel    Component Value Date/Time   CHOL 152 04/14/2018 1133   CHOL 112 03/31/2017 0912   TRIG 126.0 04/14/2018 1133   HDL 55.40 04/14/2018 1133   HDL 47 03/31/2017 0912   CHOLHDL 3 04/14/2018 1133   VLDL 25.2 04/14/2018 1133   LDLCALC 72 04/14/2018 1133   LDLCALC 44 03/31/2017 0912    Physical Exam:    VS:  BP 136/60   Pulse (!) 52   Ht 5\' 6"  (1.676 m)   Wt 144 lb 12.8 oz (65.7 kg)   SpO2 96%   BMI 23.37 kg/m     Wt Readings from Last 3 Encounters:  04/16/18 144 lb 12.8 oz (65.7 kg)  04/14/18 145 lb (65.8 kg)  03/10/18 146 lb (66.2 kg)     GEN:  Well nourished, well developed in no acute distress HEENT: Normal NECK: No JVD; No carotid bruits LYMPHATICS: No lymphadenopathy CARDIAC: RRR, no murmurs, rubs, gallops RESPIRATORY:  Clear to auscultation without rales, wheezing or rhonchi  ABDOMEN: Soft, non-tender, non-distended MUSCULOSKELETAL:  No edema; No deformity  SKIN: Warm and dry NEUROLOGIC:  Alert and oriented x 3 PSYCHIATRIC:  Normal affect   ASSESSMENT:    1. Coronary artery disease involving native coronary artery of native heart without angina  pectoris   2. Essential hypertension   3. Bilateral carotid artery stenosis   4. Pure hypercholesterolemia   5. Preoperative cardiovascular examination    PLAN:    In order of problems listed above:  1.  ASCAD - s/p  PCI of the RCA.  He denies any anginal symptoms.  He will continue on aspirin 81 mg daily, beta-blocker and statin.  2.  HTN - BP is controlled on exam.  He will continue on amlodipine 5 mg daily, carvedilol 6.25 mg twice daily and HCTZ 25 mg daily.  Creatinine was stable at 1.15 on 10/08/2017.  I will repeat a bmet today.  3.  Bilateral carotid artery stenosis -Dopplers 10/2016 showed 1 to 39% bilateral stenosis.  He will continue on aspirin and statin therapy.  4.  Hyperlipidemia -LDL goal is less than 70.  His LDL was 52 on 10/08/2017 and ALT was normal at 20.  He will continue on Lipitor 80 mg daily.  5.  Preop cardiac clearance -going to be having nasal surgery done in the near future.  He is stable from a cardiac standpoint.  He is able to exercise greater than 4 METS without any anginal symptoms.  Blood pressure is well controlled.  His revised cardiac risk index is 0.9 which gives him a 0.4% risk of major perioperative cardiac event.  He is low risk for this procedure.  He is okay to hold aspirin prior to his surgery.   Medication Adjustments/Labs and Tests Ordered: Current medicines are reviewed at length with the patient today.  Concerns regarding medicines are outlined above.  Orders Placed This Encounter  Procedures  . Basic metabolic panel  . EKG 12-Lead   No orders of the defined types were placed in this encounter.   Signed, Armanda Magicraci Faraz Ponciano, MD  04/16/2018 9:45 AM    [facility]

## 2018-04-21 ENCOUNTER — Telehealth: Payer: Self-pay | Admitting: Cardiology

## 2018-04-21 NOTE — Telephone Encounter (Signed)
New Message       [facility] Pre-operative Risk Assessment    Request for surgical clearance:  1. What type of surgery is being performed? Endoscopy sinus surgery with fusion   2. When is this surgery scheduled? 05/07/2018    3. What type of clearance is required (medical clearance vs. Pharmacy clearance to hold med vs. Both)? both  4. Are there any medications that need to be held prior to surgery and how long? Aspirin 7 days before surgery  5. Practice name and name of physician performing surgery? [facility], Dr. Melissa Montane    6. What is your office phone number [phone]   7.   What is your office fax number [phone]   8.   Anesthesia type (None, local, MAC, general) ? General    Avaletta L Williams 04/21/2018, 12:43 PM  _________________________________________________________________   (provider comments below)

## 2018-04-22 NOTE — Telephone Encounter (Signed)
   Primary Cardiologist: Armanda Magicraci Turner, MD  Chart reviewed as part of pre-operative protocol coverage.   Dr. Mayford Knifeurner already addressed preoperative clearance in her 04/16/18 off note. Per her note:  "5.  Preop cardiac clearance -going to be having nasal surgery done in the near future.  He is stable from a cardiac standpoint.  He is able to exercise greater than 4 METS without any anginal symptoms.  Blood pressure is well controlled.  His revised cardiac risk index is 0.9 which gives him a 0.4% risk of major perioperative cardiac event.  He is low risk for this procedure.  He is okay to hold aspirin prior to his surgery."  I will route this recommendation to the requesting party via Epic fax function and remove from pre-op pool.  Please call with questions.  Laurann Montanaayna N Dunn, PA-C 04/22/2018, 1:30 PM

## 2018-04-26 ENCOUNTER — Other Ambulatory Visit: Payer: Self-pay | Admitting: Cardiology

## 2018-05-02 ENCOUNTER — Other Ambulatory Visit: Payer: Self-pay | Admitting: Cardiology

## 2018-05-03 DIAGNOSIS — R972 Elevated prostate specific antigen [PSA]: Secondary | ICD-10-CM | POA: Diagnosis not present

## 2018-05-11 DIAGNOSIS — R972 Elevated prostate specific antigen [PSA]: Secondary | ICD-10-CM | POA: Diagnosis not present

## 2018-05-11 DIAGNOSIS — N400 Enlarged prostate without lower urinary tract symptoms: Secondary | ICD-10-CM | POA: Diagnosis not present

## 2018-05-18 ENCOUNTER — Other Ambulatory Visit: Payer: Self-pay | Admitting: Otolaryngology

## 2018-05-18 DIAGNOSIS — J321 Chronic frontal sinusitis: Secondary | ICD-10-CM | POA: Diagnosis not present

## 2018-05-18 DIAGNOSIS — J323 Chronic sphenoidal sinusitis: Secondary | ICD-10-CM | POA: Diagnosis not present

## 2018-05-18 DIAGNOSIS — J338 Other polyp of sinus: Secondary | ICD-10-CM | POA: Diagnosis not present

## 2018-05-18 DIAGNOSIS — J324 Chronic pansinusitis: Secondary | ICD-10-CM | POA: Diagnosis not present

## 2018-05-18 DIAGNOSIS — J329 Chronic sinusitis, unspecified: Secondary | ICD-10-CM | POA: Diagnosis not present

## 2018-05-18 DIAGNOSIS — J322 Chronic ethmoidal sinusitis: Secondary | ICD-10-CM | POA: Diagnosis not present

## 2018-05-18 DIAGNOSIS — J320 Chronic maxillary sinusitis: Secondary | ICD-10-CM | POA: Diagnosis not present

## 2018-05-25 ENCOUNTER — Other Ambulatory Visit: Payer: Self-pay | Admitting: Family Medicine

## 2018-05-25 MED ORDER — DONEPEZIL HCL 10 MG PO TABS: 10.0000 mg | ORAL_TABLET | Freq: Every day | ORAL | 0 refills | Status: DC

## 2018-05-25 NOTE — Telephone Encounter (Signed)
Copied from CRM [id number]. Topic: General - Other >> May 25, 2018  9:23 AM Elliot GaultBell, Tiffany M wrote: Relation to pt: self Call back number:[phone] (m) [facility] [place], [place] - [id number] [phone] (Phone) [phone] (Fax)   Reason for call:  Patient requesting 90 day supply with 3 refills of donepezil (ARICEPT) 10 MG tablet, informed patient please allow 48 to 72 hour turn around time.

## 2018-05-25 NOTE — Telephone Encounter (Signed)
Aricept LRF 04/14/18  #90  0 refills  LOV 04/14/18 Dr. Beverely Lowabori  Pt is requesting 3 refills  [facility] [place]

## 2018-05-27 HISTORY — PX: NASAL SINUS SURGERY: SHX719

## 2018-05-28 DIAGNOSIS — L821 Other seborrheic keratosis: Secondary | ICD-10-CM | POA: Diagnosis not present

## 2018-05-28 DIAGNOSIS — L308 Other specified dermatitis: Secondary | ICD-10-CM | POA: Diagnosis not present

## 2018-05-31 ENCOUNTER — Telehealth: Payer: Self-pay | Admitting: Family Medicine

## 2018-05-31 DIAGNOSIS — J324 Chronic pansinusitis: Secondary | ICD-10-CM | POA: Diagnosis not present

## 2018-05-31 NOTE — Telephone Encounter (Signed)
Called and advised pt that refill was sent in on 05/25/18 and he should receive it in another week.

## 2018-05-31 NOTE — Telephone Encounter (Signed)
Copied from CRM [id number]. Topic: Quick Communication - Rx Refill/Question >> May 31, 2018  4:16 PM Alexander BergeronBarksdale, Harvey B wrote: Medication: donepezil (ARICEPT) 10 MG tablet [[id number]][[id number]]   Pt called to ask why the medication was not given w/ refills; contact pt to advise

## 2018-06-09 ENCOUNTER — Other Ambulatory Visit: Payer: Self-pay | Admitting: Family Medicine

## 2018-06-09 DIAGNOSIS — J324 Chronic pansinusitis: Secondary | ICD-10-CM | POA: Diagnosis not present

## 2018-06-17 DIAGNOSIS — J324 Chronic pansinusitis: Secondary | ICD-10-CM | POA: Diagnosis not present

## 2018-07-01 DIAGNOSIS — J324 Chronic pansinusitis: Secondary | ICD-10-CM | POA: Diagnosis not present

## 2018-07-04 ENCOUNTER — Other Ambulatory Visit: Payer: Self-pay | Admitting: Family Medicine

## 2018-07-05 ENCOUNTER — Other Ambulatory Visit: Payer: Self-pay | Admitting: Cardiology

## 2018-07-16 ENCOUNTER — Ambulatory Visit: Payer: Medicare Other | Admitting: Family Medicine

## 2018-07-16 ENCOUNTER — Other Ambulatory Visit: Payer: Self-pay

## 2018-07-16 ENCOUNTER — Encounter: Payer: Self-pay | Admitting: Family Medicine

## 2018-07-16 VITALS — BP 131/78 | HR 66 | Temp 98.2°F | Resp 17 | Ht 65.0 in | Wt 145.0 lb

## 2018-07-16 DIAGNOSIS — Z23 Encounter for immunization: Secondary | ICD-10-CM

## 2018-07-16 DIAGNOSIS — R05 Cough: Secondary | ICD-10-CM | POA: Diagnosis not present

## 2018-07-16 DIAGNOSIS — R197 Diarrhea, unspecified: Secondary | ICD-10-CM

## 2018-07-16 DIAGNOSIS — R053 Chronic cough: Secondary | ICD-10-CM

## 2018-07-16 NOTE — Patient Instructions (Signed)
Follow up as needed or as scheduled Complete the stool studies as directed so we can determine the best course of treatment for your diarrhea The next step in your cough work up is to go back to Dr Leone PayorGessner and do the endoscopy- referral placed (they should call you with your appt) I truly feel that the coughing after eating indicates an issue that is GI related Call with any questions or concerns Hang in there!

## 2018-07-16 NOTE — Addendum Note (Signed)
Addended by: Lenis DickinsonILLARD, BETHANY M on: 07/16/2018 11:28 AM   Modules accepted: Orders

## 2018-07-16 NOTE — Addendum Note (Signed)
Addended by: Lenis DickinsonILLARD, Carlas Vandyne M on: 07/16/2018 11:27 AM   Modules accepted: Orders

## 2018-07-16 NOTE — Progress Notes (Signed)
   Subjective:    Patient ID: [name], male    DOB: 09/22/1935, 83 y.o.   MRN: [id number]  HPI Cough- ongoing issue for pt.  Occurs after eating.  Has seen GI and ENT.  Had barium swallow that recommended esophageal w/u.  Pt has had sinus surgery w/ Dr [name].  Cough occurs after eating dry foods or lying down.  Pt has sensation of something 'being stuck'.  Diarrhea- pt reports he has been having 'blow outs' x2 weeks.  Having 'up to 10' BMs daily.  Hx of 'ulcerative colitis' when scoped by Dr [name].  Having some accidents.  Stools are watery.  Has not called Dr [name].  No relief w/ immodium.  Pt has been on multiple rounds of abx recently- most recently [name]   Review of Systems For ROS see HPI     Objective:   Physical Exam  Constitutional: He is oriented to person, place, and time. He appears well-developed and well-nourished. No distress.  HENT:  Head: Normocephalic and atraumatic.  Moist mucous membranes  Cardiovascular: Normal rate and regular rhythm.  Pulmonary/Chest: Effort normal and breath sounds normal. No stridor. No respiratory distress. He has no wheezes. He has no rales.  Abdominal: Soft. Bowel sounds are normal. He exhibits no distension. There is no tenderness. There is no rebound and no guarding.  Neurological: He is alert and oriented to person, place, and time.  Skin: Skin is warm and dry.  Vitals reviewed.         Assessment & Plan:  Diarrhea- pt has had watery, explosive stools x2 weeks up to 10x/day.  He has completed multiple rounds of abx.  Concern for C Diff rather than UC which is what pt is concerned about (but he was told this over 10 yrs ago and has not had sxs since).  Will hold on tx until stool studies are available.  Pt expressed understanding and is in agreement w/ plan.   Chronic cough- reviewed pt's GI, ENT records as well as barium swallow results.  Dr [name] and radiology at time of MBS recommended esophageal work up.  Lungs are CTAB  on exam today and no cough heard during exam.  Pt is asking about pulmonary work up- suggested we wait on this until he completes the GI assessment.  Referral placed back to Dr [name]'s office.  Will follow.

## 2018-07-20 LAB — TIQ-NTM

## 2018-07-22 ENCOUNTER — Other Ambulatory Visit: Payer: Self-pay | Admitting: Family Medicine

## 2018-07-22 ENCOUNTER — Other Ambulatory Visit: Payer: Self-pay | Admitting: General Practice

## 2018-07-22 LAB — STOOL CULTURE
MICRO NUMBER: 91132829
MICRO NUMBER: 91132830
MICRO NUMBER:: 91132831
SHIGA RESULT:: NOT DETECTED
SPECIMEN QUALITY: ADEQUATE
SPECIMEN QUALITY:: ADEQUATE
SPECIMEN QUALITY:: ADEQUATE

## 2018-07-22 LAB — CLOSTRIDIUM DIFFICILE CULTURE-FECAL

## 2018-07-22 LAB — C.DIFF TOXINB QL PCR: CLOSTRIDIUM DIFFICILE TOXINB,QL REAL TIME PCR: DETECTED — CR

## 2018-07-22 MED ORDER — VANCOMYCIN HCL 125 MG PO CAPS: 125.0000 mg | ORAL_CAPSULE | Freq: Four times a day (QID) | ORAL | 0 refills | Status: DC

## 2018-07-22 NOTE — Telephone Encounter (Signed)
Copied from CRM [id number]. Topic: Quick Communication - Rx Refill/Question >> Jul 22, 2018  9:22 AM [name] wrote: [name]; contact pt if needed  Medication: Vancomycin  Has the patient contacted their pharmacy? Yes.   (Agent: If no, request that the patient contact the pharmacy for the refill.) (Agent: If yes, when and what did the pharmacy advise?)  Preferred Pharmacy (with phone number or street name): [facility]  Agent: Please be advised that RX refills may take up to 3 business days. We ask that you follow-up with your pharmacy.

## 2018-07-22 NOTE — Telephone Encounter (Signed)
Notes were under lab results.  I have contacted patient and reviewed with him. He stated verbal understanding.

## 2018-07-23 ENCOUNTER — Telehealth: Payer: Self-pay | Admitting: Family Medicine

## 2018-07-23 ENCOUNTER — Encounter: Payer: Self-pay | Admitting: Gastroenterology

## 2018-07-23 ENCOUNTER — Ambulatory Visit: Payer: Medicare Other | Admitting: Gastroenterology

## 2018-07-23 VITALS — BP 112/56 | HR 72 | Ht 64.0 in | Wt 144.2 lb

## 2018-07-23 DIAGNOSIS — R053 Chronic cough: Secondary | ICD-10-CM

## 2018-07-23 DIAGNOSIS — F458 Other somatoform disorders: Secondary | ICD-10-CM

## 2018-07-23 DIAGNOSIS — R05 Cough: Secondary | ICD-10-CM | POA: Diagnosis not present

## 2018-07-23 DIAGNOSIS — R198 Other specified symptoms and signs involving the digestive system and abdomen: Secondary | ICD-10-CM

## 2018-07-23 DIAGNOSIS — R0989 Other specified symptoms and signs involving the circulatory and respiratory systems: Secondary | ICD-10-CM

## 2018-07-23 NOTE — Progress Notes (Signed)
07/23/2018 [name] [id number] 1935/08/25   HISTORY OF PRESENT ILLNESS: This is an 83 year old male who is a patient of Dr. Marvell Fuller.  He was seen here in May of this year for complaints of chronic cough.  Modified barium swallow study was essentially unremarkable.  He saw ENT, Dr. Jearld Fenton, and reports that he did have sinus surgery, but continues with cough and what sounds like regurgitation of material.  Was going to see pulmonary but apparently Dr. Beverely Low said she thought he should be seen back here again first.  In regards to acid reflux medication he is on omeprazole 20 mg daily.  Just diagnosed with Cdiff so is on vancomycin for that.  Had been on a lot of antibiotics this year with his sinus surgery most recently.   Past Medical History:  Diagnosis Date  . Arthritis   . C. difficile diarrhea   . CAD (coronary artery disease) 2005   s/p PCI of RCA  . Carotid artery stenosis    1-39% bilateral by dopplers 10/2016  . Cognitive impairment, mild, so stated   . Colitis   . DVT (deep venous thrombosis) (HCC)   . Erectile dysfunction   . GERD (gastroesophageal reflux disease)   . Hyperlipidemia   . Hypertension   . Peripheral vascular disease (HCC)   . Vertebrobasilar artery syndrome   . Vertigo    Past Surgical History:  Procedure Laterality Date  . APPENDECTOMY     Pt. was 83 yrs old  . COLONOSCOPY    . PR VEIN BYPASS GRAFT,AORTO-FEM-POP  2007    reports that he quit smoking about 57 years ago. He has never used smokeless tobacco. He reports that he drinks alcohol. He reports that he does not use drugs. family history includes Deep vein thrombosis in his father and mother; Diverticulitis in his father; Hyperlipidemia in his father and mother; Hypertension in his father and mother; Stroke in his father and mother; Ulcerative colitis in his father. No Known Allergies    Outpatient Encounter Medications as of 07/23/2018  Medication Sig  . amLODipine (NORVASC) 5 MG  tablet TAKE 1 TABLET BY MOUTH DAILY. PLEASE KEEP UPCOMING APPOINTMENT IN JUNE WITH DOCTOR TURNER FOR FUTURE REFILLS  . aspirin 81 MG tablet Take 81 mg by mouth daily.  Marland Kitchen atorvastatin (LIPITOR) 80 MG tablet TAKE 1 TABLET BY MOUTH DAILY  . carvedilol (COREG) 6.25 MG tablet TAKE 1 TABLET BY MOUTH TWICE DAILY  . cetirizine (ZYRTEC) 10 MG tablet Take 10 mg by mouth daily.  Marland Kitchen donepezil (ARICEPT) 10 MG tablet Take 1 tablet (10 mg total) by mouth daily.  . fish oil-omega-3 fatty acids 1000 MG capsule Take 1 g by mouth 2 (two) times daily.  . fluticasone (FLONASE) 50 MCG/ACT nasal spray Place 2 sprays into both nostrils daily.  Marland Kitchen gabapentin (NEURONTIN) 100 MG capsule Take 2 capsules (200 mg total) by mouth at bedtime.  . hydrochlorothiazide (HYDRODIURIL) 25 MG tablet TAKE 1 TABLET BY MOUTH DAILY  . meloxicam (MOBIC) 15 MG tablet Take 1 tablet (15 mg total) by mouth daily.  Marland Kitchen omeprazole (PRILOSEC) 20 MG capsule TAKE 1 CAPSULE(20 MG) BY MOUTH DAILY  . vancomycin (VANCOCIN) 125 MG capsule Take 1 capsule (125 mg total) by mouth 4 (four) times daily.   No facility-administered encounter medications on file as of 07/23/2018.      REVIEW OF SYSTEMS  : All other systems reviewed and negative except where noted in the History of Present Illness.  PHYSICAL EXAM: BP (!) 112/56 (BP Location: Left Arm, Patient Position: Sitting, Cuff Size: Normal)   Pulse 72   Ht 5\' 4"  (1.626 m) Comment: height measured without shoes  Wt 144 lb 4 oz (65.4 kg)   BMI 24.76 kg/m  General: Well developed white male in no acute distress Head: Normocephalic and atraumatic Eyes:  Sclerae anicteric, conjunctiva pink. Ears: Normal auditory acuity Lungs: Clear throughout to auscultation; no increased WOB. Heart: Regular rate and rhythm; no M/R/G. Abdomen: Soft, non-distended.  BS present.  Non-tender. Musculoskeletal: Symmetrical with no gross deformities  Skin: No lesions on visible extremities Extremities: No edema    Neurological: Alert oriented x 4, grossly non-focal Psychological:  Alert and cooperative. Normal mood and affect  ASSESSMENT AND PLAN: *Chronic cough and globus sensation: Modified barium swallow study did not give any reason for symptoms.  ENT evaluation was reportedly unremarkable for cause of his symptoms.  Denies dysphasia.  Question if this is all severe reflux or if he could have a Zenker's diverticulum.  Will start with barium esophagram.   CC:  Sheliah Hatch, MD

## 2018-07-23 NOTE — Telephone Encounter (Signed)
The stool sample from this pt is positive for C-diff.   [facility] is faxing the report over.    I called [facility] [employer] [facility] and spoke with [place].   I gave her the results.   She said they were aware of the results yesterday and Dr. Beverely Low has already started the pt on antibiotics.    Nothing further needed.

## 2018-07-23 NOTE — Patient Instructions (Signed)
If you are age 81 or older, your body mass index should be between 23-30. Your Body mass index is 24.76 kg/m. If this is out of the aforementioned range listed, please consider follow up with your Primary Care Provider.  If you are age 9 or younger, your body mass index should be between 19-25. Your Body mass index is 24.76 kg/m. If this is out of the aformentioned range listed, please consider follow up with your Primary Care Provider.   You have been scheduled for a Barium Esophogram at [facility] (1st floor of the hospital) on 07/27/18 at 9:30 am. Please arrive 15 minutes prior to your appointment for registration. Make certain not to have anything to eat or drink 3 hours prior to your test. If you need to reschedule for any reason, please contact radiology at [phone] to do so. __________________________________________________________________ A barium swallow is an examination that concentrates on views of the esophagus. This tends to be a double contrast exam (barium and two liquids which, when combined, create a gas to distend the wall of the oesophagus) or single contrast (non-ionic iodine based). The study is usually tailored to your symptoms so a good history is essential. Attention is paid during the study to the form, structure and configuration of the esophagus, looking for functional disorders (such as aspiration, dysphagia, achalasia, motility and reflux) EXAMINATION You may be asked to change into a gown, depending on the type of swallow being performed. A radiologist and radiographer will perform the procedure. The radiologist will advise you of the type of contrast selected for your procedure and direct you during the exam. You will be asked to stand, sit or lie in several different positions and to hold a small amount of fluid in your mouth before being asked to swallow while the imaging is performed .In some instances you may be asked to swallow barium coated  marshmallows to assess the motility of a solid food bolus. The exam can be recorded as a digital or video fluoroscopy procedure. POST PROCEDURE It will take 1-2 days for the barium to pass through your system. To facilitate this, it is important, unless otherwise directed, to increase your fluids for the next 24-48hrs and to resume your normal diet.  This test typically takes about 30 minutes to perform. _____________________________________________________________________________  We will call you with results.  Thank you for choosing me and [facility].   Doug Sou, PA-C

## 2018-07-27 ENCOUNTER — Ambulatory Visit (HOSPITAL_COMMUNITY)
Admission: RE | Admit: 2018-07-27 | Discharge: 2018-07-27 | Disposition: A | Discharge status: Home / Self Care | Payer: Medicare Other | Source: Ambulatory Visit | Attending: Gastroenterology | Admitting: Gastroenterology

## 2018-07-27 DIAGNOSIS — K224 Dyskinesia of esophagus: Secondary | ICD-10-CM | POA: Insufficient documentation

## 2018-07-27 DIAGNOSIS — K219 Gastro-esophageal reflux disease without esophagitis: Secondary | ICD-10-CM | POA: Insufficient documentation

## 2018-07-27 DIAGNOSIS — R198 Other specified symptoms and signs involving the digestive system and abdomen: Secondary | ICD-10-CM

## 2018-07-27 DIAGNOSIS — K449 Diaphragmatic hernia without obstruction or gangrene: Secondary | ICD-10-CM | POA: Diagnosis not present

## 2018-07-27 DIAGNOSIS — R053 Chronic cough: Secondary | ICD-10-CM

## 2018-07-27 DIAGNOSIS — R05 Cough: Secondary | ICD-10-CM | POA: Diagnosis not present

## 2018-07-27 DIAGNOSIS — R0989 Other specified symptoms and signs involving the circulatory and respiratory systems: Secondary | ICD-10-CM | POA: Diagnosis not present

## 2018-07-29 ENCOUNTER — Telehealth: Payer: Self-pay | Admitting: Family Medicine

## 2018-07-29 ENCOUNTER — Encounter: Payer: Self-pay | Admitting: Gastroenterology

## 2018-07-29 ENCOUNTER — Telehealth: Payer: Self-pay | Admitting: Gastroenterology

## 2018-07-29 DIAGNOSIS — R198 Other specified symptoms and signs involving the digestive system and abdomen: Secondary | ICD-10-CM | POA: Insufficient documentation

## 2018-07-29 DIAGNOSIS — R0989 Other specified symptoms and signs involving the circulatory and respiratory systems: Secondary | ICD-10-CM | POA: Insufficient documentation

## 2018-07-29 NOTE — Telephone Encounter (Signed)
Patient notified of PCP recommendations and is agreement and expresses an understanding.   Ok for PEC to Discuss results / PCP recommendations / Schedule patient.   

## 2018-07-29 NOTE — Telephone Encounter (Signed)
Should these results be give to him from GI?

## 2018-07-29 NOTE — Telephone Encounter (Signed)
Copied from CRM [id number]. Topic: Quick Communication - See Telephone Encounter >> Jul 29, 2018  9:04 AM Luanna Cole wrote: CRM for notification. See Telephone encounter for: 07/29/18. Pt called and stated that he would like results from ESOPHOGRAM. Please advise Cb#[phone]

## 2018-07-29 NOTE — Telephone Encounter (Signed)
Pt will need to call GI as these are not my results to give

## 2018-07-29 NOTE — Telephone Encounter (Signed)
The pt aware that [name] has not reviewed the results and as soon as she does we will contact him.  [name] the pt is calling for results of BE.

## 2018-08-02 NOTE — Progress Notes (Signed)
High dose bid PPI (omeprazole 40 mg bid before breakfast and supper) is what we should do to see if reflux is the culprit - x 8 weeks  As long as his C diff diarrhea is improved I think can start the bid PPi as above after he finishes vancomycin and also needs to have an appointment to see me in late November  He may also use Delsym OTC cough suppressant 2tsp q 12 hrs and should use hard candies to suck on and suprress cough as possible

## 2018-08-04 ENCOUNTER — Other Ambulatory Visit: Payer: Self-pay

## 2018-08-04 MED ORDER — OMEPRAZOLE 40 MG PO CPDR: 40.0000 mg | DELAYED_RELEASE_CAPSULE | Freq: Two times a day (BID) | ORAL | 1 refills | Status: DC

## 2018-08-10 ENCOUNTER — Telehealth: Payer: Self-pay | Admitting: Gastroenterology

## 2018-08-10 ENCOUNTER — Other Ambulatory Visit: Payer: Self-pay | Admitting: Family Medicine

## 2018-08-10 ENCOUNTER — Telehealth: Payer: Self-pay | Admitting: Family Medicine

## 2018-08-10 DIAGNOSIS — Z8619 Personal history of other infectious and parasitic diseases: Secondary | ICD-10-CM

## 2018-08-10 DIAGNOSIS — R197 Diarrhea, unspecified: Secondary | ICD-10-CM

## 2018-08-10 NOTE — Telephone Encounter (Signed)
Pt is requesting vancomycin. He said that he had loose stools yesterday the same way that when he had c-diff.

## 2018-08-10 NOTE — Telephone Encounter (Signed)
The pt has been advised to come in for stool testing.  Order in Maynard. The pt has been advised of the information and verbalized understanding.

## 2018-08-10 NOTE — Telephone Encounter (Signed)
Copied from CRM [id number]. Topic: Quick Communication - See Telephone Encounter >> Aug 10, 2018 10:49 AM Mare Loan F wrote: Pt is needing a refill on vancomycin hydrochloride 125 mg capsules  Best number [phone]  [facility] [address] [phone]

## 2018-08-10 NOTE — Telephone Encounter (Signed)
Call to patient- patient states he thought he was getting better- but for the last 2 days he reports he has had to rush to the bathroom and has almost not made it. ( He is afraid he is going to mess his pants) He states his stool is soft- not "mushy" yet. He does not think he is completely over the C-diff. He is requesting a refill of the antibiotics.  Told patient would send message to PCP for review.

## 2018-08-10 NOTE — Telephone Encounter (Signed)
Spoke with patient to let him know he needed to call GI.Marland Kitchen He said he would give them a call.

## 2018-08-10 NOTE — Telephone Encounter (Signed)
The pt has a history of C diff and starting yesterday he began to have loose stools.  2 episodes today with urgency.  Do you want stool studies or treat?

## 2018-08-10 NOTE — Telephone Encounter (Signed)
That would be something he needs to contact GI for.  They have seen him more recently and would be able to determine if additional abx are needed

## 2018-08-10 NOTE — Telephone Encounter (Signed)
Recheck stool for Cdiff.

## 2018-08-11 ENCOUNTER — Other Ambulatory Visit: Payer: Medicare Other

## 2018-08-11 DIAGNOSIS — Z8619 Personal history of other infectious and parasitic diseases: Secondary | ICD-10-CM | POA: Diagnosis not present

## 2018-08-11 DIAGNOSIS — R197 Diarrhea, unspecified: Secondary | ICD-10-CM

## 2018-08-11 NOTE — Telephone Encounter (Signed)
Refill denied. Pt needs an appt. 

## 2018-08-12 ENCOUNTER — Other Ambulatory Visit: Payer: Self-pay

## 2018-08-12 LAB — CLOSTRIDIUM DIFFICILE TOXIN B, QUALITATIVE, REAL-TIME PCR: CDIFFPCR: DETECTED — AB

## 2018-08-12 MED ORDER — VANCOMYCIN HCL 125 MG PO CAPS: ORAL_CAPSULE | ORAL | 0 refills | Status: AC

## 2018-08-19 ENCOUNTER — Other Ambulatory Visit: Payer: Self-pay | Admitting: Family Medicine

## 2018-08-20 NOTE — Telephone Encounter (Signed)
Refill denied. Pt has not been seen within a year.  

## 2018-09-14 ENCOUNTER — Other Ambulatory Visit: Payer: Self-pay | Admitting: Family Medicine

## 2018-09-17 ENCOUNTER — Ambulatory Visit: Payer: Medicare Other | Admitting: Internal Medicine

## 2018-09-17 ENCOUNTER — Encounter: Payer: Self-pay | Admitting: Internal Medicine

## 2018-09-17 VITALS — BP 130/58 | HR 76 | Ht 64.0 in | Wt 142.5 lb

## 2018-09-17 DIAGNOSIS — K222 Esophageal obstruction: Secondary | ICD-10-CM

## 2018-09-17 DIAGNOSIS — A0471 Enterocolitis due to Clostridium difficile, recurrent: Secondary | ICD-10-CM

## 2018-09-17 DIAGNOSIS — R05 Cough: Secondary | ICD-10-CM

## 2018-09-17 DIAGNOSIS — J392 Other diseases of pharynx: Secondary | ICD-10-CM | POA: Diagnosis not present

## 2018-09-17 DIAGNOSIS — R053 Chronic cough: Secondary | ICD-10-CM

## 2018-09-17 MED ORDER — TRAMADOL HCL 50 MG PO TABS: 50.0000 mg | ORAL_TABLET | Freq: Four times a day (QID) | ORAL | 1 refills | Status: DC | PRN

## 2018-09-17 NOTE — Patient Instructions (Addendum)
    To suppress and hopefully eliminate the cough:  Take Delsym two tsp every 12 hours and supplement if needed with  tramadol 50 mg up to 2 every 4 hours to suppress the urge to cough. Swallowing water and/or using ice chips/non- mint and menthol containing candies (such as lifesavers or sugarless jolly ranchers) are also effective.  You should rest your voice and avoid activities that you know make you cough.  Once you have eliminated the cough for 3 straight days try reducing the tramadol first,  then the delsym as tolerated.    You have been scheduled for an endoscopy. Please follow written instructions given to you at your visit today. If you use inhalers (even only as needed), please bring them with you on the day of your procedure.   I appreciate the opportunity to care for you. Stan Headarl Kohen Reither, MD, [facility]AC[facility]

## 2018-09-17 NOTE — Progress Notes (Signed)
[name] 83 y.o. 1934-12-25 [id number]  Assessment & Plan:   Encounter Diagnoses  Name Primary?  . Chronic cough Yes  . Esophageal ring   . 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  There appear to be multiple triggers but it is often postprandial.  His modified barium swallow showed some laryngeal penetration and he had this production of barium colored material 2 hours after the study which raises some question of aspiration or at least retention of things in the posterior pharyngeal space.  On his barium swallow of the esophagus there is a question of cricopharyngeal prominence he has cervical spine osteophytes and these may be creating a problem where things are not totally clearing the upper esophagus or the posterior pharynx, I suspect.  Dilation of that might help.  He does have a lower esophageal ring as well but I do not think that part of this problem.  Aricept also is noted to cause esophageal dysmotility though he is been on that for 10 years and his wife thinks it has helped his memory.  So we will plan for an upper endoscopy with dilation, Maloney dilation to treat these things and see if that makes a difference.  Continue omeprazole 40 mg twice daily for now but hopefully taper that to less frequent and lower dose in the future if possible.  Cough suppression is emphasized because with this chronic cough that we will hopefully help eliminate it, each time he coughs he reinjures things.  Plan will be to use Delsym twice daily, also take tramadol short-term, and other maneuvers like hard candy drinking water to suppress cough and helpful he eliminate.   His C. difficile is in remission on a tapering dose of vancomycin.  Thus we are able to have his procedure done in the endoscopy center.  I do not think he is transmitting C. difficile at this time.  I appreciate the opportunity to  care for this patient. CC: Robert Hatch, MD    Subjective:   Chief Complaint: Cough question dysphagia recovering C. difficile  HPI [name] is here with his wife in follow-up, I had seen him in the summer for chronic cough.  A modified barium swallow showed laryngeal penetration but no aspiration and no cause of his cough. He saw [name] of ENT and was diagnosed with chronic sinusitis thought to be the cause of the cough.  He was treated with clindamycin and nasal irrigation.  He did not improve enough and he had a CT scan showing right greater than left sinusitis and most of his sinuses.  He had polyp changes noted with nasal endoscopy he was treated with clindamycin longer and nasal steroids.  He had surgery.  Follow-up noted his nasal passages/airways were more open and improved.  He can breathe better and his cough is better not as severe but he still coughs every day throughout the day at different times and sometimes at night.  He is not having overt dysphagia but after he eats sometime later within a couple of hours he will cough up and expectorate phlegm like material.  He got C. difficile after all the antibiotics for his sinus problems, he was treated with a 10-day course of vancomycin and had a rapid recurrence and was seen by [name] in our office in October and is on a tapering course and is not having any diarrhea at  this time.  Does not have heartburn he is on omeprazole twice a day he is not sure that helped him but his wife does say he is better than he was.  [name] ordered a barium swallow and it demonstrated nonspecific motility disorder, inducible GE reflux with water swallowing, a patent lower esophageal mucosal ring and a small sliding hiatal hernia and prominent cricopharyngeal impression and moderate degenerative disc disease with anterior spurring at C5-6   No Known Allergies Current Meds  Medication Sig  . amLODipine (NORVASC) 5 MG tablet TAKE 1  TABLET BY MOUTH DAILY. PLEASE KEEP UPCOMING APPOINTMENT IN JUNE WITH DOCTOR TURNER FOR FUTURE REFILLS  . aspirin 81 MG tablet Take 81 mg by mouth daily.  Marland Kitchen atorvastatin (LIPITOR) 80 MG tablet TAKE 1 TABLET BY MOUTH DAILY  . carvedilol (COREG) 6.25 MG tablet TAKE 1 TABLET BY MOUTH TWICE DAILY  . cetirizine (ZYRTEC) 10 MG tablet Take 10 mg by mouth daily.  Marland Kitchen donepezil (ARICEPT) 10 MG tablet TAKE 1 TABLET BY MOUTH DAILY  . fish oil-omega-3 fatty acids 1000 MG capsule Take 1 g by mouth 2 (two) times daily.  . fluticasone (FLONASE) 50 MCG/ACT nasal spray Place 2 sprays into both nostrils daily.  Marland Kitchen gabapentin (NEURONTIN) 100 MG capsule Take 2 capsules (200 mg total) by mouth at bedtime.  . hydrochlorothiazide (HYDRODIURIL) 25 MG tablet TAKE 1 TABLET BY MOUTH DAILY  . meloxicam (MOBIC) 15 MG tablet Take 1 tablet (15 mg total) by mouth daily.  Marland Kitchen omeprazole (PRILOSEC) 40 MG capsule Take 1 capsule (40 mg total) by mouth 2 (two) times daily before a meal.  . vancomycin (VANCOCIN HCL) 125 MG capsule Take 1 capsule (125 mg total) by mouth 4 (four) times daily for 14 days, THEN 1 capsule (125 mg total) 2 (two) times daily for 7 days, THEN 1 capsule (125 mg total) daily for 7 days, THEN 1 capsule (125 mg total) every other day.   Past Medical History:  Diagnosis Date  . Arthritis   . C. difficile diarrhea   . CAD (coronary artery disease) 2005   s/p PCI of RCA  . Carotid artery stenosis    1-39% bilateral by dopplers 10/2016  . Cognitive impairment, mild, so stated   . Colitis   . DVT (deep venous thrombosis) (HCC)   . Erectile dysfunction   . GERD (gastroesophageal reflux disease)   . Hyperlipidemia   . Hypertension   . Peripheral vascular disease (HCC)   . Vertebrobasilar artery syndrome   . Vertigo    Past Surgical History:  Procedure Laterality Date  . APPENDECTOMY     Pt. was 83 yrs old  . COLONOSCOPY    . PR VEIN BYPASS GRAFT,AORTO-FEM-POP  2007   Social History   Social History  Narrative   Married with 1 son and 2 daughters.  Retired.   2 caffeinated beverages daily, 3 glasses of wine daily   Never smoker no drug use no tobacco   family history includes Deep vein thrombosis in his father and mother; Diverticulitis in his father; Hyperlipidemia in his father and mother; Hypertension in his father and mother; Stroke in his father and mother; Ulcerative colitis in his father.   Review of Systems As above  Objective:   Physical Exam BP (!) 130/58 (BP Location: Left Arm, Patient Position: Sitting, Cuff Size: Normal)   Pulse 76   Ht 5\' 4"  (1.626 m)   Wt 142 lb 8 oz (64.6 kg)   BMI 24.46 kg/m  Elderly white man no acute distress Somewhat raspy voice Oral and posterior pharynx looks clear Lungs are clear Heart sounds are normal

## 2018-09-22 ENCOUNTER — Encounter: Payer: Self-pay | Admitting: Internal Medicine

## 2018-09-26 HISTORY — PX: ESOPHAGOGASTRODUODENOSCOPY (EGD) WITH ESOPHAGEAL DILATION: SHX5812

## 2018-10-04 ENCOUNTER — Encounter: Payer: Self-pay | Admitting: Internal Medicine

## 2018-10-04 ENCOUNTER — Ambulatory Visit (AMBULATORY_SURGERY_CENTER): Payer: Medicare Other | Admitting: Internal Medicine

## 2018-10-04 VITALS — BP 134/69 | HR 78 | Temp 97.5°F | Resp 16 | Ht 64.0 in | Wt 142.5 lb

## 2018-10-04 DIAGNOSIS — R1310 Dysphagia, unspecified: Secondary | ICD-10-CM | POA: Diagnosis not present

## 2018-10-04 DIAGNOSIS — I2510 Atherosclerotic heart disease of native coronary artery without angina pectoris: Secondary | ICD-10-CM | POA: Diagnosis not present

## 2018-10-04 DIAGNOSIS — R05 Cough: Secondary | ICD-10-CM

## 2018-10-04 DIAGNOSIS — I739 Peripheral vascular disease, unspecified: Secondary | ICD-10-CM | POA: Diagnosis not present

## 2018-10-04 DIAGNOSIS — K222 Esophageal obstruction: Secondary | ICD-10-CM

## 2018-10-04 DIAGNOSIS — I10 Essential (primary) hypertension: Secondary | ICD-10-CM | POA: Diagnosis not present

## 2018-10-04 DIAGNOSIS — R053 Chronic cough: Secondary | ICD-10-CM

## 2018-10-04 MED ORDER — SODIUM CHLORIDE 0.9 % IV SOLN
500.0000 mL | Freq: Once | INTRAVENOUS | Status: DC
Start: 2018-10-04 — End: 2018-10-04

## 2018-10-04 NOTE — Progress Notes (Signed)
To recovery, report bto RN, VSS 

## 2018-10-04 NOTE — Progress Notes (Signed)
Called to room to assist during endoscopic procedure.  Patient ID and intended procedure confirmed with present staff. Received instructions for my participation in the procedure from the performing physician.  

## 2018-10-04 NOTE — Op Note (Addendum)
[facility] Patient Name: [name] Procedure Date: 10/04/2018 1:35 PM MRN: [id number] Endoscopist: Iva Boop , MD Age: 83 Referring MD:  Date of Birth: 1935/02/08 Gender: Male Account #: [id number] Procedure:                Upper GI endoscopy Indications:              Chronic cough Medicines:                Propofol per Anesthesia, Monitored Anesthesia Care Procedure:                Pre-Anesthesia Assessment:                           - Prior to the procedure, a History and Physical                            was performed, and patient medications and                            allergies were reviewed. The patient's tolerance of                            previous anesthesia was also reviewed. The risks                            and benefits of the procedure and the sedation                            options and risks were discussed with the patient.                            All questions were answered, and informed consent                            was obtained. Prior Anticoagulants: The patient has                            taken no previous anticoagulant or antiplatelet                            agents. ASA Grade Assessment: III - A patient with                            severe systemic disease. After reviewing the risks                            and benefits, the patient was deemed in                            satisfactory condition to undergo the procedure.                           After obtaining informed consent, the endoscope was  passed under direct vision. Throughout the                            procedure, the patient's blood pressure, pulse, and                            oxygen saturations were monitored continuously. The                            Endoscope was introduced through the mouth, and                            advanced to the second part of duodenum. The upper                            GI endoscopy was  accomplished without difficulty.                            The patient tolerated the procedure well. Scope In: Scope Out: Findings:                 A mild Schatzki ring was found at the                            gastroesophageal junction. The scope was withdrawn.                            Dilation was performed with a Maloney dilator with                            no resistance at 54 Fr. The dilation site was                            examined following endoscope reinsertion and showed                            no change. Estimated blood loss: none.                           The exam was otherwise without abnormality.                           The cardia and gastric fundus were normal on                            retroflexion. Complications:            No immediate complications. Estimated Blood Loss:     Estimated blood loss: none. Impression:               - Mild Schatzki ring. Dilated.                           - The examination was otherwise normal.                           -  No specimens collected. Recommendation:           - Patient has a contact number available for                            emergencies. The signs and symptoms of potential                            delayed complications were discussed with the                            patient. Return to normal activities tomorrow.                            Written discharge instructions were provided to the                            patient.                           - Clear liquids x 1 hour then soft foods rest of                            day. Start prior diet tomorrow.                           - Continue present medications.                           - Seems like maximal GI intervention for cough                            undertaken - bid PPI, MBS abd ba swallow and now                            esophageal dilation - had ? cricopharyngeal spasm                            on Ba swallow, flash penetration on  MBS and coughed                            up barium 2 hrs after MBS. Dilation undertaken to                            see if opening up cricopharyngeus would help -                            would have to say I did not appreciate stenosis                            there. Believe the Schatzki Ring is unrelated. Iva Boop, MD 10/04/2018 2:05:10 PM This report has been signed electronically.

## 2018-10-04 NOTE — Patient Instructions (Addendum)
   I dilated the esophagus - let us see if that helps things.  I appreciate the opportunity to care for you. Robert Booparl E. Adaline Trejos, MD, FACG   YOU HAD AN ENDOSCOPIC PROCEDURE TODAY AT THE [facility]:   Refer to the procedure report that was given to you for any specific questions about what was found during the examination.  If the procedure report does not answer your questions, please call your gastroenterologist to clarify.  If you requested that your care partner not be given the details of your procedure findings, then the procedure report has been included in a sealed envelope for you to review at your convenience later.  YOU SHOULD EXPECT: Some feelings of bloating in the abdomen. Passage of more gas than usual.  Walking can help get rid of the air that was put into your GI tract during the procedure and reduce the bloating. If you had a lower endoscopy (such as a colonoscopy or flexible sigmoidoscopy) you may notice spotting of blood in your stool or on the toilet paper. If you underwent a bowel prep for your procedure, you may not have a normal bowel movement for a few days.  Please Note:  You might notice some irritation and congestion in your nose or some drainage.  This is from the oxygen used during your procedure.  There is no need for concern and it should clear up in a day or so.  SYMPTOMS TO REPORT IMMEDIATELY:     Following upper endoscopy (EGD)  Vomiting of blood or coffee ground material  New chest pain or pain under the shoulder blades  Painful or persistently difficult swallowing  New shortness of breath  Fever of 100F or higher  Black, tarry-looking stools  For urgent or emergent issues, a gastroenterologist can be reached at any hour by calling [phone].   DIET:  We do recommend a small meal at first, but then you may proceed to your regular diet.  Drink plenty of fluids but you should avoid alcoholic beverages for 24 hours.  ACTIVITY:  You  should plan to take it easy for the rest of today and you should NOT DRIVE or use heavy machinery until tomorrow (because of the sedation medicines used during the test).    FOLLOW UP: Our staff will call the number listed on your records the next business day following your procedure to check on you and address any questions or concerns that you may have regarding the information given to you following your procedure. If we do not reach you, we will leave a message.  However, if you are feeling well and you are not experiencing any problems, there is no need to return our call.  We will assume that you have returned to your regular daily activities without incident.  If any biopsies were taken you will be contacted by phone or by letter within the next 1-3 weeks.  Please call us at [phone] if you have not heard about the biopsies in 3 weeks.    SIGNATURES/CONFIDENTIALITY: You and/or your care partner have signed paperwork which will be entered into your electronic medical record.  These signatures attest to the fact that that the information above on your After Visit Summary has been reviewed and is understood.  Full responsibility of the confidentiality of this discharge information lies with you and/or your care-partner.

## 2018-10-05 ENCOUNTER — Telehealth: Payer: Self-pay

## 2018-10-05 NOTE — Telephone Encounter (Signed)
  Follow up Call-  Call back number 10/04/2018  Post procedure Call Back phone  # [phone]  Permission to leave phone message Yes  Some recent data might be hidden     Patient questions:  Do you have a fever, pain , or abdominal swelling? No. Pain Score  0 *  Have you tolerated food without any problems? Yes.    Have you been able to return to your normal activities? Yes.    Do you have any questions about your discharge instructions: Diet   No. Medications  No. Follow up visit  No.  Do you have questions or concerns about your Care? No.  Actions: * If pain score is 4 or above: No action needed, pain <4.

## 2018-10-11 ENCOUNTER — Ambulatory Visit: Payer: Medicare Other | Admitting: Family Medicine

## 2018-10-11 ENCOUNTER — Other Ambulatory Visit: Payer: Self-pay

## 2018-10-11 ENCOUNTER — Encounter: Payer: Self-pay | Admitting: Family Medicine

## 2018-10-11 VITALS — BP 122/68 | HR 70 | Temp 98.0°F | Resp 16 | Ht 64.0 in | Wt 139.0 lb

## 2018-10-11 DIAGNOSIS — I10 Essential (primary) hypertension: Secondary | ICD-10-CM

## 2018-10-11 DIAGNOSIS — R053 Chronic cough: Secondary | ICD-10-CM

## 2018-10-11 DIAGNOSIS — E7800 Pure hypercholesterolemia, unspecified: Secondary | ICD-10-CM | POA: Diagnosis not present

## 2018-10-11 DIAGNOSIS — R05 Cough: Secondary | ICD-10-CM | POA: Diagnosis not present

## 2018-10-11 LAB — LIPID PANEL
Cholesterol: 108 mg/dL (ref 0–200)
HDL: 40.7 mg/dL (ref >39.00)
LDL Cholesterol: 51 mg/dL (ref 0–99)
NONHDL: 67.17
Total CHOL/HDL Ratio: 3
Triglycerides: 80 mg/dL (ref 0.0–149.0)
VLDL: 16 mg/dL (ref 0.0–40.0)

## 2018-10-11 LAB — CBC WITH DIFFERENTIAL/PLATELET
Basophils Absolute: 0.1 10*3/uL (ref 0.0–0.1)
Basophils Relative: 0.9 % (ref 0.0–3.0)
EOS ABS: 0.2 10*3/uL (ref 0.0–0.7)
Eosinophils Relative: 2.6 % (ref 0.0–5.0)
HCT: 39.7 % (ref 39.0–52.0)
Hemoglobin: 13 g/dL (ref 13.0–17.0)
Lymphocytes Relative: 24.3 % (ref 12.0–46.0)
Lymphs Abs: 1.8 10*3/uL (ref 0.7–4.0)
MCHC: 32.6 g/dL (ref 30.0–36.0)
MCV: 83.1 fl (ref 78.0–100.0)
Monocytes Absolute: 0.8 10*3/uL (ref 0.1–1.0)
Monocytes Relative: 11.2 % (ref 3.0–12.0)
NEUTROS PCT: 61 % (ref 43.0–77.0)
Neutro Abs: 4.6 10*3/uL (ref 1.4–7.7)
Platelets: 245 10*3/uL (ref 150.0–400.0)
RBC: 4.78 Mil/uL (ref 4.22–5.81)
RDW: 15.2 % (ref 11.5–15.5)
WBC: 7.5 10*3/uL (ref 4.0–10.5)

## 2018-10-11 LAB — BASIC METABOLIC PANEL
BUN: 17 mg/dL (ref 6–23)
CO2: 29 meq/L (ref 19–32)
Calcium: 9.2 mg/dL (ref 8.4–10.5)
Chloride: 101 mEq/L (ref 96–112)
Creatinine, Ser: 0.94 mg/dL (ref 0.40–1.50)
GFR: 81.8 mL/min (ref >60.00)
Glucose, Bld: 98 mg/dL (ref 70–99)
Potassium: 3.8 mEq/L (ref 3.5–5.1)
SODIUM: 137 meq/L (ref 135–145)

## 2018-10-11 LAB — HEPATIC FUNCTION PANEL
ALK PHOS: 76 U/L (ref 39–117)
ALT: 17 U/L (ref 0–53)
AST: 20 U/L (ref 0–37)
Albumin: 4 g/dL (ref 3.5–5.2)
Bilirubin, Direct: 0.1 mg/dL (ref 0.0–0.3)
Total Bilirubin: 0.4 mg/dL (ref 0.2–1.2)
Total Protein: 6.9 g/dL (ref 6.0–8.3)

## 2018-10-11 LAB — TSH: TSH: 1.91 u[IU]/mL (ref 0.35–4.50)

## 2018-10-11 MED ORDER — ALBUTEROL SULFATE HFA 108 (90 BASE) MCG/ACT IN AERS: 2.0000 | INHALATION_SPRAY | Freq: Four times a day (QID) | RESPIRATORY_TRACT | 2 refills | Status: DC | PRN

## 2018-10-11 MED ORDER — ALBUTEROL SULFATE (2.5 MG/3ML) 0.083% IN NEBU
2.5000 mg | INHALATION_SOLUTION | Freq: Once | RESPIRATORY_TRACT | Status: AC
  Administered 2018-10-11: 2.5 mg via RESPIRATORY_TRACT

## 2018-10-11 NOTE — Assessment & Plan Note (Signed)
Chronic problem.  Tolerating statin w/o difficulty.  Check labs.  Adjust meds prn  

## 2018-10-11 NOTE — Progress Notes (Signed)
   Subjective:    Patient ID: [name], male    DOB: 05/25/1935, 83 y.o.   MRN: [id number]  HPI HTN- chronic problem, on Coreg 6.25mg  BID, HCTZ 25mg  daily.  'I feel good'.  Denies CP, SOB, HAs, visual changes, edema.  Hyperlipidemia- chronic problem, on Lipitor 80mg  daily.  Denies abd pain, N/V.  Cough- ongoing issue for pt for >1 yr.  Recently had esophagus stretched and this was of no help.  Wife reports wheezing at night.  Has seen ENT and is s/p sinus surgery.   Review of Systems For ROS see HPI     Objective:   Physical Exam Vitals signs reviewed.  Constitutional:      General: He is not in acute distress.    Appearance: He is well-developed.  HENT:     Head: Normocephalic and atraumatic.  Eyes:     Conjunctiva/sclera: Conjunctivae normal.     Pupils: Pupils are equal, round, and reactive to light.  Neck:     Musculoskeletal: Normal range of motion and neck supple.     Thyroid: No thyromegaly.  Cardiovascular:     Rate and Rhythm: Normal rate and regular rhythm.     Heart sounds: Normal heart sounds. No murmur.  Pulmonary:     Effort: Pulmonary effort is normal. No respiratory distress.     Breath sounds: Wheezing (diffuse wheezes, improved s/p neb tx) present.     Comments: + hacking cough- improved s/p neb tx Abdominal:     General: Bowel sounds are normal. There is no distension.     Palpations: Abdomen is soft.  Lymphadenopathy:     Cervical: No cervical adenopathy.  Skin:    General: Skin is warm and dry.  Neurological:     Mental Status: He is alert and oriented to person, place, and time.     Cranial Nerves: No cranial nerve deficit.  Psychiatric:        Behavior: Behavior normal.           Assessment & Plan:

## 2018-10-11 NOTE — Assessment & Plan Note (Signed)
Chronic problem.  Pt has seen ENT, had sinus surgery, seen GI, had esophageal dilation and continues to cough.  At this time, refer to pulm for complete evaluation and tx.  Pt's cough and wheezing improved in office s/p neb tx, will start Albuterol prn while awaiting pulmonary appt.  Pt expressed understanding and is in agreement w/ plan.

## 2018-10-11 NOTE — Assessment & Plan Note (Signed)
Chronic problem.  Well controlled.  Asymptomatic w/ exception of cough.  Check labs.  No anticipated med changes.  Will follow.

## 2018-10-11 NOTE — Patient Instructions (Addendum)
Schedule your complete physical in 6 months We'll notify you of your lab results and make any changes if needed Continue to work on healthy diet and regular exercise- you can do it! Use the Albuterol inhaler- 2 puffs every 4 hrs as needed for cough, wheezing, or shortness of breath We'll call you with your pulmonary appt to assess the cough Call with any questions or concerns Happy Holidays!!

## 2018-10-12 ENCOUNTER — Other Ambulatory Visit: Payer: Self-pay | Admitting: *Deleted

## 2018-10-12 ENCOUNTER — Encounter: Payer: Self-pay | Admitting: General Practice

## 2018-10-12 MED ORDER — GABAPENTIN 100 MG PO CAPS: 200.0000 mg | ORAL_CAPSULE | Freq: Every day | ORAL | 0 refills | Status: DC

## 2018-10-12 NOTE — Telephone Encounter (Signed)
Pt called to schedule an appt with Dr. Katrinka BlazingSmith since he has not been seen since 07/2017 & needs a refill on gabapentin.  Pt scheduled 10/15/18 & rx sent into pharmacy.

## 2018-10-14 NOTE — Progress Notes (Signed)
[name] [address] [place], [place] [address] Phone: [phone] Subjective:     CC: Back and right hip pain follow-up  [id number]  [name] is a 83 y.o. male coming in with complaint of back pain. Needs a refill on gabapentin.   Was off gabapentin for 1 week and pain got significantly worse again.  Patient feels that it does well.  Allows him to do all activities of daily living without any significant discomfort.  Does not notice any side effects.    Past Medical History:  Diagnosis Date  . Arthritis   . C. difficile diarrhea   . CAD (coronary artery disease) 2005   s/p PCI of RCA  . Carotid artery stenosis    1-39% bilateral by dopplers 10/2016  . Cognitive impairment, mild, so stated   . Colitis   . DVT (deep venous thrombosis) (HCC)   . Erectile dysfunction   . GERD (gastroesophageal reflux disease)   . Hyperlipidemia   . Hypertension   . Peripheral vascular disease (HCC)   . Vertebrobasilar artery syndrome   . Vertigo    Past Surgical History:  Procedure Laterality Date  . APPENDECTOMY     Pt. was 83 yrs old  . COLONOSCOPY    . PR VEIN BYPASS GRAFT,AORTO-FEM-POP  2007   Social History   Socioeconomic History  . Marital status: Married    Spouse name: Not on file  . Number of children: Not on file  . Years of education: Not on file  . Highest education level: Not on file  Occupational History  . Not on file  Social Needs  . Financial resource strain: Not on file  . Food insecurity:    Worry: Not on file    Inability: Not on file  . Transportation needs:    Medical: Not on file    Non-medical: Not on file  Tobacco Use  . Smoking status: Former Smoker    Last attempt to quit: 05/13/1961    Years since quitting: 57.4  . Smokeless tobacco: Never Used  Substance and Sexual Activity  . Alcohol use: Yes    Alcohol/week: 14.0 standard drinks    Types: 14 Glasses of wine per week    Comment: wine nightly  . Drug  use: No  . Sexual activity: Not on file  Lifestyle  . Physical activity:    Days per week: Not on file    Minutes per session: Not on file  . Stress: Not on file  Relationships  . Social connections:    Talks on phone: Not on file    Gets together: Not on file    Attends religious service: Not on file    Active member of club or organization: Not on file    Attends meetings of clubs or organizations: Not on file    Relationship status: Not on file  Other Topics Concern  . Not on file  Social History Narrative   Married with 1 son and 2 daughters.  Retired.   2 caffeinated beverages daily, 3 glasses of wine daily   Never smoker no drug use no tobacco   No Known Allergies Family History  Problem Relation Age of Onset  . Stroke Mother   . Hyperlipidemia Mother   . Hypertension Mother   . Deep vein thrombosis Mother   . Stroke Father   . Hyperlipidemia Father   . Hypertension Father   . Deep vein thrombosis Father   .  Diverticulitis Father   . Ulcerative colitis Father   . Colon cancer Neg Hx   . Colon polyps Neg Hx   . Esophageal cancer Neg Hx   . Stomach cancer Neg Hx   . Rectal cancer Neg Hx      Current Outpatient Medications (Cardiovascular):  .  amLODipine (NORVASC) 5 MG tablet, TAKE 1 TABLET BY MOUTH DAILY. PLEASE KEEP UPCOMING APPOINTMENT IN JUNE WITH DOCTOR TURNER FOR FUTURE REFILLS .  atorvastatin (LIPITOR) 80 MG tablet, TAKE 1 TABLET BY MOUTH DAILY .  carvedilol (COREG) 6.25 MG tablet, TAKE 1 TABLET BY MOUTH TWICE DAILY .  hydrochlorothiazide (HYDRODIURIL) 25 MG tablet, TAKE 1 TABLET BY MOUTH DAILY  Current Outpatient Medications (Respiratory):  .  albuterol (PROVENTIL HFA;VENTOLIN HFA) 108 (90 Base) MCG/ACT inhaler, Inhale 2 puffs into the lungs every 6 (six) hours as needed for wheezing or shortness of breath. .  cetirizine (ZYRTEC) 10 MG tablet, Take 10 mg by mouth daily. .  fluticasone (FLONASE) 50 MCG/ACT nasal spray, Place 2 sprays into both nostrils  daily.  Current Outpatient Medications (Analgesics):  .  aspirin 81 MG tablet, Take 81 mg by mouth daily. .  meloxicam (MOBIC) 15 MG tablet, Take 1 tablet (15 mg total) by mouth daily. .  traMADol (ULTRAM) 50 MG tablet, Take 1 tablet (50 mg total) by mouth 4 (four) times daily as needed.   Current Outpatient Medications (Other):  .  donepezil (ARICEPT) 10 MG tablet, TAKE 1 TABLET BY MOUTH DAILY .  fish oil-omega-3 fatty acids 1000 MG capsule, Take 1 g by mouth 2 (two) times daily. Marland Kitchen.  gabapentin (NEURONTIN) 100 MG capsule, Take 2 capsules (200 mg total) by mouth at bedtime. Marland Kitchen.  omeprazole (PRILOSEC) 40 MG capsule, Take 1 capsule (40 mg total) by mouth 2 (two) times daily before a meal. .  Saw Palmetto 500 MG CAPS, Take 500 mg by mouth at bedtime.    Past medical history, social, surgical and family history all reviewed in electronic medical record.  No pertanent information unless stated regarding to the chief complaint.   Review of Systems:  No headache, visual changes, nausea, vomiting, diarrhea, constipation, dizziness, abdominal pain, skin rash, fevers, chills, night sweats, weight loss, swollen lymph nodes, body aches, joint swelling, chest pain, shortness of breath, mood changes.  Positive muscle aches  Objective  Blood pressure 140/70, pulse 77, height 5\' 4"  (1.626 m), weight 139 lb (63 kg), SpO2 95 %.   General: No apparent distress alert and oriented x3 mood and affect normal, dressed appropriately.  HEENT: Pupils equal, extraocular movements intact  Respiratory: Patient's speak in full sentences and does not appear short of breath  Cardiovascular: No lower extremity edema, non tender, no erythema  Skin: Warm dry intact with no signs of infection or rash on extremities or on axial skeleton.  Abdomen: Soft nontender  Neuro: Cranial nerves II through XII are intact, neurovascularly intact in all extremities with 2+ DTRs and 2+ pulses.  Lymph: No lymphadenopathy of posterior or  anterior cervical chain or axillae bilaterally.  Gait normal with good balance and coordination.  MSK:  Non tender with full range of motion and good stability and symmetric strength and tone of shoulders, elbows, wrist, hip, knee and ankles bilaterally.  Mild arthritic changes of multiple joints Back exam does have some degenerative scoliosis.  Some tightness of the [facility] test right greater than left.  Tightness of the right side of the back.  Mild limited in the last 15 degrees  of extension of the back.   Impression and Recommendations:      The above documentation has been reviewed and is accurate and complete Judi SaaZachary M Smith, DO       Note: This dictation was prepared with Dragon dictation along with smaller phrase technology. Any transcriptional errors that result from this process are unintentional.

## 2018-10-15 ENCOUNTER — Ambulatory Visit (INDEPENDENT_AMBULATORY_CARE_PROVIDER_SITE_OTHER): Payer: Medicare Other | Admitting: Family Medicine

## 2018-10-15 ENCOUNTER — Encounter: Payer: Self-pay | Admitting: Family Medicine

## 2018-10-15 DIAGNOSIS — M48062 Spinal stenosis, lumbar region with neurogenic claudication: Secondary | ICD-10-CM | POA: Diagnosis not present

## 2018-10-15 NOTE — Assessment & Plan Note (Signed)
Doing well at this time.  Encourage patient to the exercise more regular basis.  Discussed that injections could be beneficial again if necessary.  Refilled gabapentin for 1 year.  Follow-up again in 1 year as long as patient does well

## 2018-10-15 NOTE — Patient Instructions (Signed)
God to see you  Refilled the gabapentin  You will do great  Exercises 3 times a week.  pennsaid pinkie amount topically 2 times daily as needed.  As long as you do well see me when you need me Happy holidays!

## 2018-10-21 ENCOUNTER — Telehealth: Payer: Self-pay | Admitting: Family Medicine

## 2018-10-21 NOTE — Telephone Encounter (Signed)
Patient came by the office requesting a refill on his meloxicam (MOBIC) 15 MG tablet sent to [facility].

## 2018-10-22 MED ORDER — MELOXICAM 15 MG PO TABS: 15.0000 mg | ORAL_TABLET | Freq: Every day | ORAL | 0 refills | Status: DC

## 2018-10-22 NOTE — Telephone Encounter (Signed)
Refill done.  

## 2018-10-23 ENCOUNTER — Emergency Department (HOSPITAL_COMMUNITY): Payer: Medicare Other | Admitting: Certified Registered"

## 2018-10-23 ENCOUNTER — Inpatient Hospital Stay (HOSPITAL_COMMUNITY): Payer: Medicare Other

## 2018-10-23 ENCOUNTER — Other Ambulatory Visit: Payer: Self-pay

## 2018-10-23 ENCOUNTER — Emergency Department (HOSPITAL_COMMUNITY): Payer: Medicare Other

## 2018-10-23 ENCOUNTER — Inpatient Hospital Stay (HOSPITAL_COMMUNITY)
Admission: EM | Admit: 2018-10-23 | Discharge: 2018-10-29 | DRG: 326 | Disposition: A | Discharge status: Home / Self Care | Payer: Medicare Other | Attending: General Surgery | Admitting: General Surgery

## 2018-10-23 ENCOUNTER — Encounter (HOSPITAL_COMMUNITY): Admission: EM | Disposition: A | Discharge status: Home / Self Care | Payer: Self-pay | Source: Home / Self Care

## 2018-10-23 ENCOUNTER — Encounter (HOSPITAL_COMMUNITY): Payer: Self-pay | Admitting: *Deleted

## 2018-10-23 DIAGNOSIS — N529 Male erectile dysfunction, unspecified: Secondary | ICD-10-CM | POA: Diagnosis present

## 2018-10-23 DIAGNOSIS — Z9289 Personal history of other medical treatment: Secondary | ICD-10-CM

## 2018-10-23 DIAGNOSIS — J45909 Unspecified asthma, uncomplicated: Secondary | ICD-10-CM | POA: Diagnosis not present

## 2018-10-23 DIAGNOSIS — R319 Hematuria, unspecified: Secondary | ICD-10-CM | POA: Diagnosis present

## 2018-10-23 DIAGNOSIS — I6529 Occlusion and stenosis of unspecified carotid artery: Secondary | ICD-10-CM | POA: Diagnosis present

## 2018-10-23 DIAGNOSIS — R52 Pain, unspecified: Secondary | ICD-10-CM | POA: Diagnosis not present

## 2018-10-23 DIAGNOSIS — R079 Chest pain, unspecified: Secondary | ICD-10-CM | POA: Diagnosis not present

## 2018-10-23 DIAGNOSIS — Z87891 Personal history of nicotine dependence: Secondary | ICD-10-CM | POA: Diagnosis not present

## 2018-10-23 DIAGNOSIS — I2510 Atherosclerotic heart disease of native coronary artery without angina pectoris: Secondary | ICD-10-CM | POA: Diagnosis present

## 2018-10-23 DIAGNOSIS — Z978 Presence of other specified devices: Secondary | ICD-10-CM | POA: Diagnosis not present

## 2018-10-23 DIAGNOSIS — Z823 Family history of stroke: Secondary | ICD-10-CM | POA: Diagnosis not present

## 2018-10-23 DIAGNOSIS — N179 Acute kidney failure, unspecified: Secondary | ICD-10-CM | POA: Diagnosis not present

## 2018-10-23 DIAGNOSIS — K5730 Diverticulosis of large intestine without perforation or abscess without bleeding: Secondary | ICD-10-CM | POA: Diagnosis not present

## 2018-10-23 DIAGNOSIS — K668 Other specified disorders of peritoneum: Secondary | ICD-10-CM | POA: Diagnosis not present

## 2018-10-23 DIAGNOSIS — R339 Retention of urine, unspecified: Secondary | ICD-10-CM

## 2018-10-23 DIAGNOSIS — R1901 Right upper quadrant abdominal swelling, mass and lump: Secondary | ICD-10-CM | POA: Diagnosis not present

## 2018-10-23 DIAGNOSIS — I10 Essential (primary) hypertension: Secondary | ICD-10-CM | POA: Diagnosis present

## 2018-10-23 DIAGNOSIS — E785 Hyperlipidemia, unspecified: Secondary | ICD-10-CM | POA: Diagnosis not present

## 2018-10-23 DIAGNOSIS — K255 Chronic or unspecified gastric ulcer with perforation: Principal | ICD-10-CM | POA: Diagnosis present

## 2018-10-23 DIAGNOSIS — K219 Gastro-esophageal reflux disease without esophagitis: Secondary | ICD-10-CM | POA: Diagnosis present

## 2018-10-23 DIAGNOSIS — I739 Peripheral vascular disease, unspecified: Secondary | ICD-10-CM | POA: Diagnosis not present

## 2018-10-23 DIAGNOSIS — J95821 Acute postprocedural respiratory failure: Secondary | ICD-10-CM | POA: Diagnosis not present

## 2018-10-23 DIAGNOSIS — Z8249 Family history of ischemic heart disease and other diseases of the circulatory system: Secondary | ICD-10-CM | POA: Diagnosis not present

## 2018-10-23 DIAGNOSIS — R0902 Hypoxemia: Secondary | ICD-10-CM | POA: Diagnosis not present

## 2018-10-23 DIAGNOSIS — Z955 Presence of coronary angioplasty implant and graft: Secondary | ICD-10-CM | POA: Diagnosis not present

## 2018-10-23 DIAGNOSIS — R05 Cough: Secondary | ICD-10-CM | POA: Diagnosis not present

## 2018-10-23 DIAGNOSIS — R1084 Generalized abdominal pain: Secondary | ICD-10-CM | POA: Diagnosis not present

## 2018-10-23 DIAGNOSIS — K251 Acute gastric ulcer with perforation: Secondary | ICD-10-CM | POA: Diagnosis not present

## 2018-10-23 DIAGNOSIS — Z8349 Family history of other endocrine, nutritional and metabolic diseases: Secondary | ICD-10-CM | POA: Diagnosis not present

## 2018-10-23 DIAGNOSIS — K659 Peritonitis, unspecified: Secondary | ICD-10-CM | POA: Diagnosis present

## 2018-10-23 DIAGNOSIS — R112 Nausea with vomiting, unspecified: Secondary | ICD-10-CM | POA: Diagnosis not present

## 2018-10-23 DIAGNOSIS — Z86718 Personal history of other venous thrombosis and embolism: Secondary | ICD-10-CM

## 2018-10-23 DIAGNOSIS — R1011 Right upper quadrant pain: Secondary | ICD-10-CM | POA: Diagnosis not present

## 2018-10-23 DIAGNOSIS — K3189 Other diseases of stomach and duodenum: Secondary | ICD-10-CM

## 2018-10-23 DIAGNOSIS — K275 Chronic or unspecified peptic ulcer, site unspecified, with perforation: Secondary | ICD-10-CM | POA: Diagnosis not present

## 2018-10-23 DIAGNOSIS — R1010 Upper abdominal pain, unspecified: Secondary | ICD-10-CM | POA: Diagnosis not present

## 2018-10-23 DIAGNOSIS — J9600 Acute respiratory failure, unspecified whether with hypoxia or hypercapnia: Secondary | ICD-10-CM | POA: Diagnosis not present

## 2018-10-23 DIAGNOSIS — Z4682 Encounter for fitting and adjustment of non-vascular catheter: Secondary | ICD-10-CM | POA: Diagnosis not present

## 2018-10-23 DIAGNOSIS — R911 Solitary pulmonary nodule: Secondary | ICD-10-CM | POA: Diagnosis not present

## 2018-10-23 DIAGNOSIS — Z9889 Other specified postprocedural states: Secondary | ICD-10-CM

## 2018-10-23 HISTORY — PX: LAPAROTOMY: SHX154

## 2018-10-23 HISTORY — DX: Other diseases of stomach and duodenum: K31.89

## 2018-10-23 HISTORY — DX: Occlusion and stenosis of unspecified carotid artery: I65.29

## 2018-10-23 LAB — COMPREHENSIVE METABOLIC PANEL
ALT: 18 U/L (ref 0–44)
AST: 23 U/L (ref 15–41)
Albumin: 3.5 g/dL (ref 3.5–5.0)
Alkaline Phosphatase: 83 U/L (ref 38–126)
Anion gap: 10 (ref 5–15)
BUN: 18 mg/dL (ref 8–23)
CO2: 29 mmol/L (ref 22–32)
CREATININE: 1.17 mg/dL (ref 0.61–1.24)
Calcium: 9.3 mg/dL (ref 8.9–10.3)
Chloride: 99 mmol/L (ref 98–111)
GFR calc Af Amer: >60 mL/min (ref >60)
GFR calc non Af Amer: 58 mL/min — ABNORMAL LOW (ref >60)
Glucose, Bld: 103 mg/dL — ABNORMAL HIGH (ref 70–99)
Potassium: 4.4 mmol/L (ref 3.5–5.1)
Sodium: 138 mmol/L (ref 135–145)
Total Bilirubin: 0.6 mg/dL (ref 0.3–1.2)
Total Protein: 7.1 g/dL (ref 6.5–8.1)

## 2018-10-23 LAB — CBC WITH DIFFERENTIAL/PLATELET
Abs Immature Granulocytes: 0.03 10*3/uL (ref 0.00–0.07)
BASOS PCT: 0 %
Basophils Absolute: 0 10*3/uL (ref 0.0–0.1)
Eosinophils Absolute: 0.1 10*3/uL (ref 0.0–0.5)
Eosinophils Relative: 1 %
HCT: 45.6 % (ref 39.0–52.0)
Hemoglobin: 13.9 g/dL (ref 13.0–17.0)
Immature Granulocytes: 0 %
Lymphocytes Relative: 12 %
Lymphs Abs: 1.2 10*3/uL (ref 0.7–4.0)
MCH: 26.2 pg (ref 26.0–34.0)
MCHC: 30.5 g/dL (ref 30.0–36.0)
MCV: 85.9 fL (ref 80.0–100.0)
MONO ABS: 0.6 10*3/uL (ref 0.1–1.0)
MONOS PCT: 6 %
Neutro Abs: 8.3 10*3/uL — ABNORMAL HIGH (ref 1.7–7.7)
Neutrophils Relative %: 81 %
Platelets: 231 10*3/uL (ref 150–400)
RBC: 5.31 MIL/uL (ref 4.22–5.81)
RDW: 14.8 % (ref 11.5–15.5)
WBC: 10.2 10*3/uL (ref 4.0–10.5)
nRBC: 0 % (ref 0.0–0.2)

## 2018-10-23 LAB — URINALYSIS, ROUTINE W REFLEX MICROSCOPIC
Bilirubin Urine: NEGATIVE
Glucose, UA: NEGATIVE mg/dL
Hgb urine dipstick: NEGATIVE
Ketones, ur: NEGATIVE mg/dL
Leukocytes, UA: NEGATIVE
Nitrite: NEGATIVE
Protein, ur: 30 mg/dL — AB
Specific Gravity, Urine: 1.02 (ref 1.005–1.030)
pH: 6 (ref 5.0–8.0)

## 2018-10-23 LAB — TRIGLYCERIDES: Triglycerides: 93 mg/dL (ref <150)

## 2018-10-23 LAB — LIPASE, BLOOD: Lipase: 38 U/L (ref 11–51)

## 2018-10-23 LAB — POCT I-STAT 3, ART BLOOD GAS (G3+)
Acid-Base Excess: 3 mmol/L — ABNORMAL HIGH (ref 0.0–2.0)
BICARBONATE: 28.8 mmol/L — AB (ref 20.0–28.0)
O2 Saturation: 99 %
Patient temperature: 98.6
TCO2: 30 mmol/L (ref 22–32)
pCO2 arterial: 45.9 mmHg (ref 32.0–48.0)
pH, Arterial: 7.405 (ref 7.350–7.450)
pO2, Arterial: 151 mmHg — ABNORMAL HIGH (ref 83.0–108.0)

## 2018-10-23 LAB — MRSA PCR SCREENING: MRSA by PCR: NEGATIVE

## 2018-10-23 SURGERY — LAPAROTOMY, EXPLORATORY
Anesthesia: General

## 2018-10-23 MED ORDER — ALBUMIN HUMAN 5 % IV SOLN
INTRAVENOUS | Status: DC | PRN
  Administered 2018-10-23: 18:00:00 via INTRAVENOUS

## 2018-10-23 MED ORDER — PROPOFOL 1000 MG/100ML IV EMUL
0.0000 ug/kg/min | INTRAVENOUS | Status: DC
  Filled 2018-10-23: qty 100

## 2018-10-23 MED ORDER — PIPERACILLIN-TAZOBACTAM 3.375 G IVPB 30 MIN
3.3750 g | Freq: Once | INTRAVENOUS | Status: AC
  Administered 2018-10-23: 3.375 g via INTRAVENOUS
  Filled 2018-10-23: qty 50

## 2018-10-23 MED ORDER — FENTANYL 2500MCG IN NS 250ML (10MCG/ML) PREMIX INFUSION: 25.0000 ug/h | INTRAVENOUS | Status: DC

## 2018-10-23 MED ORDER — SODIUM CHLORIDE 0.9 % IV SOLN
INTRAVENOUS | Status: DC
  Administered 2018-10-23: 20:00:00 via INTRAVENOUS

## 2018-10-23 MED ORDER — FENTANYL CITRATE (PF) 100 MCG/2ML IJ SOLN
50.0000 ug | Freq: Once | INTRAMUSCULAR | Status: AC
  Administered 2018-10-24: 50 ug via INTRAVENOUS
  Filled 2018-10-23: qty 2

## 2018-10-23 MED ORDER — PROPOFOL 500 MG/50ML IV EMUL
INTRAVENOUS | Status: DC | PRN
  Administered 2018-10-23: 50 ug/kg/min via INTRAVENOUS

## 2018-10-23 MED ORDER — ONDANSETRON HCL 4 MG/2ML IJ SOLN
4.0000 mg | Freq: Four times a day (QID) | INTRAMUSCULAR | Status: DC | PRN
  Administered 2018-10-23 – 2018-10-25 (×2): 4 mg via INTRAVENOUS
  Filled 2018-10-23 (×2): qty 2

## 2018-10-23 MED ORDER — FENTANYL 2500MCG IN NS 250ML (10MCG/ML) PREMIX INFUSION
25.0000 ug/h | INTRAVENOUS | Status: DC
  Administered 2018-10-23: 50 ug/h via INTRAVENOUS
  Filled 2018-10-23: qty 250

## 2018-10-23 MED ORDER — FENTANYL BOLUS VIA INFUSION
25.0000 ug | INTRAVENOUS | Status: DC | PRN
  Filled 2018-10-23: qty 25

## 2018-10-23 MED ORDER — IOHEXOL 300 MG/ML  SOLN
100.0000 mL | Freq: Once | INTRAMUSCULAR | Status: AC | PRN
  Administered 2018-10-23: 100 mL via INTRAVENOUS

## 2018-10-23 MED ORDER — SODIUM CHLORIDE 0.9 % IV BOLUS
1000.0000 mL | Freq: Once | INTRAVENOUS | Status: AC
  Administered 2018-10-23: 1000 mL via INTRAVENOUS

## 2018-10-23 MED ORDER — MORPHINE SULFATE (PF) 4 MG/ML IV SOLN
4.0000 mg | INTRAVENOUS | Status: AC | PRN
  Administered 2018-10-23 – 2018-10-25 (×3): 4 mg via INTRAVENOUS
  Filled 2018-10-23 (×4): qty 1

## 2018-10-23 MED ORDER — ONDANSETRON 4 MG PO TBDP: 4.0000 mg | ORAL_TABLET | Freq: Four times a day (QID) | ORAL | Status: DC | PRN

## 2018-10-23 MED ORDER — PHENYLEPHRINE HCL-NACL 10-0.9 MG/250ML-% IV SOLN
0.0000 ug/min | INTRAVENOUS | Status: DC
  Administered 2018-10-23: 20 ug/min via INTRAVENOUS
  Administered 2018-10-24 (×4): 140 ug/min via INTRAVENOUS
  Administered 2018-10-24: 13.333 ug/min via INTRAVENOUS
  Administered 2018-10-24: 130 ug/min via INTRAVENOUS
  Administered 2018-10-24: 140 ug/min via INTRAVENOUS
  Filled 2018-10-23 (×3): qty 250
  Filled 2018-10-23: qty 500
  Filled 2018-10-23 (×3): qty 250

## 2018-10-23 MED ORDER — LIDOCAINE 2% (20 MG/ML) 5 ML SYRINGE
INTRAMUSCULAR | Status: DC | PRN
  Administered 2018-10-23: 40 mg via INTRAVENOUS

## 2018-10-23 MED ORDER — LACTATED RINGERS IV SOLN
INTRAVENOUS | Status: DC
  Administered 2018-10-23: 21:00:00 via INTRAVENOUS

## 2018-10-23 MED ORDER — ACETAMINOPHEN 10 MG/ML IV SOLN
INTRAVENOUS | Status: AC
  Filled 2018-10-23: qty 100

## 2018-10-23 MED ORDER — SODIUM CHLORIDE 0.9 % IV SOLN
INTRAVENOUS | Status: DC | PRN
  Administered 2018-10-23: 20 ug/min via INTRAVENOUS

## 2018-10-23 MED ORDER — LACTATED RINGERS IV SOLN
INTRAVENOUS | Status: DC | PRN
  Administered 2018-10-23: 18:00:00 via INTRAVENOUS

## 2018-10-23 MED ORDER — ENOXAPARIN SODIUM 40 MG/0.4ML ~~LOC~~ SOLN
40.0000 mg | SUBCUTANEOUS | Status: DC
  Administered 2018-10-24 – 2018-10-28 (×5): 40 mg via SUBCUTANEOUS
  Filled 2018-10-23 (×6): qty 0.4

## 2018-10-23 MED ORDER — PIPERACILLIN-TAZOBACTAM 3.375 G IVPB
3.3750 g | Freq: Three times a day (TID) | INTRAVENOUS | Status: DC
  Administered 2018-10-24 – 2018-10-28 (×14): 3.375 g via INTRAVENOUS
  Filled 2018-10-23 (×14): qty 50

## 2018-10-23 MED ORDER — FENTANYL CITRATE (PF) 100 MCG/2ML IJ SOLN
INTRAMUSCULAR | Status: DC | PRN
  Administered 2018-10-23 (×3): 50 ug via INTRAVENOUS

## 2018-10-23 MED ORDER — ACETAMINOPHEN 10 MG/ML IV SOLN
1000.0000 mg | Freq: Four times a day (QID) | INTRAVENOUS | Status: AC
  Administered 2018-10-23 – 2018-10-24 (×4): 1000 mg via INTRAVENOUS
  Filled 2018-10-23 (×4): qty 100

## 2018-10-23 MED ORDER — 0.9 % SODIUM CHLORIDE (POUR BTL) OPTIME
TOPICAL | Status: DC | PRN
  Administered 2018-10-23: 4000 mL

## 2018-10-23 MED ORDER — SUCCINYLCHOLINE CHLORIDE 20 MG/ML IJ SOLN
INTRAMUSCULAR | Status: DC | PRN
  Administered 2018-10-23: 100 mg via INTRAVENOUS

## 2018-10-23 MED ORDER — LACTATED RINGERS IV BOLUS
1000.0000 mL | Freq: Once | INTRAVENOUS | Status: AC
  Administered 2018-10-23: 1000 mL via INTRAVENOUS

## 2018-10-23 MED ORDER — ROCURONIUM BROMIDE 10 MG/ML (PF) SYRINGE
PREFILLED_SYRINGE | INTRAVENOUS | Status: DC | PRN
  Administered 2018-10-23: 20 mg via INTRAVENOUS
  Administered 2018-10-23: 30 mg via INTRAVENOUS

## 2018-10-23 MED ORDER — LACTATED RINGERS IV SOLN
INTRAVENOUS | Status: DC
  Administered 2018-10-23: 18:00:00 via INTRAVENOUS

## 2018-10-23 MED ORDER — FENTANYL CITRATE (PF) 100 MCG/2ML IJ SOLN: 50.0000 ug | Freq: Once | INTRAMUSCULAR | Status: DC

## 2018-10-23 MED ORDER — PANTOPRAZOLE SODIUM 40 MG IV SOLR
40.0000 mg | Freq: Two times a day (BID) | INTRAVENOUS | Status: DC
  Administered 2018-10-23 – 2018-10-27 (×9): 40 mg via INTRAVENOUS
  Filled 2018-10-23 (×10): qty 40

## 2018-10-23 MED ORDER — PROPOFOL 10 MG/ML IV BOLUS
INTRAVENOUS | Status: AC
  Filled 2018-10-23: qty 20

## 2018-10-23 MED ORDER — FENTANYL CITRATE (PF) 250 MCG/5ML IJ SOLN
INTRAMUSCULAR | Status: AC
  Filled 2018-10-23: qty 5

## 2018-10-23 MED ORDER — FENTANYL CITRATE (PF) 100 MCG/2ML IJ SOLN
50.0000 ug | Freq: Once | INTRAMUSCULAR | Status: AC
  Administered 2018-10-23: 50 ug via INTRAVENOUS
  Filled 2018-10-23: qty 2

## 2018-10-23 MED ORDER — PROPOFOL 10 MG/ML IV BOLUS
INTRAVENOUS | Status: DC | PRN
  Administered 2018-10-23: 100 mg via INTRAVENOUS

## 2018-10-23 MED ORDER — PROPOFOL 1000 MG/100ML IV EMUL: 0.0000 ug/kg/min | INTRAVENOUS | Status: DC

## 2018-10-23 SURGICAL SUPPLY — 48 items
BLADE CLIPPER SURG (BLADE) IMPLANT
CANISTER SUCT 3000ML PPV (MISCELLANEOUS) ×2 IMPLANT
CATH MALECOT BARD  24FR (CATHETERS) ×1
CATH MALECOT BARD 24FR (CATHETERS) IMPLANT
CHLORAPREP W/TINT 26ML (MISCELLANEOUS) ×2 IMPLANT
COVER SURGICAL LIGHT HANDLE (MISCELLANEOUS) ×2 IMPLANT
COVER WAND RF STERILE (DRAPES) ×2 IMPLANT
DRAIN CHANNEL 19F RND (DRAIN) ×1 IMPLANT
DRAPE LAPAROSCOPIC ABDOMINAL (DRAPES) ×2 IMPLANT
DRAPE WARM FLUID 44X44 (DRAPE) ×1 IMPLANT
DRSG OPSITE POSTOP 4X10 (GAUZE/BANDAGES/DRESSINGS) ×1 IMPLANT
DRSG OPSITE POSTOP 4X8 (GAUZE/BANDAGES/DRESSINGS) IMPLANT
ELECT BLADE 4.0 EZ CLEAN MEGAD (MISCELLANEOUS) ×2
ELECT BLADE 6.5 EXT (BLADE) IMPLANT
ELECT CAUTERY BLADE 6.4 (BLADE) IMPLANT
ELECT REM PT RETURN 9FT ADLT (ELECTROSURGICAL) ×2
ELECTRODE BLDE 4.0 EZ CLN MEGD (MISCELLANEOUS) IMPLANT
ELECTRODE REM PT RTRN 9FT ADLT (ELECTROSURGICAL) ×1 IMPLANT
EVACUATOR SILICONE 100CC (DRAIN) ×1 IMPLANT
GLOVE BIO SURGEON STRL SZ7 (GLOVE) ×2 IMPLANT
GLOVE BIOGEL PI IND STRL 7.5 (GLOVE) ×1 IMPLANT
GLOVE BIOGEL PI INDICATOR 7.5 (GLOVE) ×1
GOWN STRL REUS W/ TWL LRG LVL3 (GOWN DISPOSABLE) ×2 IMPLANT
GOWN STRL REUS W/TWL LRG LVL3 (GOWN DISPOSABLE) ×2
KIT BASIN OR (CUSTOM PROCEDURE TRAY) ×2 IMPLANT
KIT TURNOVER KIT B (KITS) ×2 IMPLANT
LIGASURE IMPACT 36 18CM CVD LR (INSTRUMENTS) ×1 IMPLANT
NS IRRIG 1000ML POUR BTL (IV SOLUTION) ×4 IMPLANT
PACK GENERAL/GYN (CUSTOM PROCEDURE TRAY) ×2 IMPLANT
PAD ARMBOARD 7.5X6 YLW CONV (MISCELLANEOUS) ×2 IMPLANT
SPECIMEN JAR LARGE (MISCELLANEOUS) IMPLANT
SPONGE LAP 18X18 X RAY DECT (DISPOSABLE) IMPLANT
STAPLER VISISTAT 35W (STAPLE) ×2 IMPLANT
SUCTION POOLE TIP (SUCTIONS) ×2 IMPLANT
SUT ETHILON 2 0 FS 18 (SUTURE) ×1 IMPLANT
SUT PDS AB 1 TP1 96 (SUTURE) ×4 IMPLANT
SUT SILK 2 0 (SUTURE) ×2
SUT SILK 2 0 SH CR/8 (SUTURE) ×3 IMPLANT
SUT SILK 2-0 18XBRD TIE 12 (SUTURE) ×1 IMPLANT
SUT SILK 3 0 (SUTURE) ×4
SUT SILK 3 0 SH CR/8 (SUTURE) ×2 IMPLANT
SUT SILK 3-0 18XBRD TIE 12 (SUTURE) ×1 IMPLANT
SUT VIC AB 3-0 SH 27 (SUTURE) ×2
SUT VIC AB 3-0 SH 27X BRD (SUTURE) IMPLANT
SUT VIC AB 3-0 SH 27XBRD (SUTURE) IMPLANT
TOWEL OR 17X26 10 PK STRL BLUE (TOWEL DISPOSABLE) ×2 IMPLANT
TRAY FOLEY MTR SLVR 16FR STAT (SET/KITS/TRAYS/PACK) ×2 IMPLANT
YANKAUER SUCT BULB TIP NO VENT (SUCTIONS) IMPLANT

## 2018-10-23 NOTE — Consult Note (Signed)
NAME:  [name], MRN:  [id number], DOB:  1935/05/09, LOS: 0 ADMISSION DATE:  10/23/2018, CONSULTATION DATE: October 23, 2018 REFERRING MD: Surgical services, CHIEF COMPLAINT: Knee pain  Brief History   83 years old with perforated gastric ulcer status post surgery and [name] patch  History of present illness   Patient is 83 year old white male History of DVT history of cardiac stent 15 years ago from the notes he has been taking meloxicam he had a refill recently today he showed up with abdominal pain CT scan showed pneumoperitoneum he was taken to the OR [name] patch was done.  He is on Zosyn. By the time I saw the patient patient was very agitated on the vent trying to pull the ET tube out I added fentanyl and propofol drips.  He also had gastrostomy tube placed Objective   Blood pressure 114/64, pulse (!) 113, temperature 98.3 F (36.8 C), temperature source Oral, resp. rate 20, height 5\' 6"  (1.676 m), weight 63.5 kg, SpO2 92 %.        Intake/Output Summary (Last 24 hours) at 10/23/2018 2005 Last data filed at 10/23/2018 [id number] Gross per 24 hour  Intake 2550 ml  Output 1000 ml  Net 1550 ml   Filed Weights   10/23/18 1242  Weight: 63.5 kg    Examination: General: Sedated intubated agitated Neuro: Sedated intubated agitated HEENT:  atraumatic , no jaundice , dry mucous membranes  Cardiovascular: Regular regular, ESM 2/6 in the aortic area  Lungs:  CTA bilateral , no wheezing or crackles  Abdomen: Postop changes dressing Musculoskeletal:  WNL , normal pulses  Skin:  No rash   Assessment & Plan:    --Pneumoperitoneum due to perforated gastric ulcer status post [name] patch and gastrostomy tube continue with PPI IV fluids antibiotics for presumed peritoneal sepsis and peritonitis from the perforation. --History of coronary artery disease stable resume aspirin when cleared by surgery. --History of DVT we need to get the medication list from the family to see if he had  been on anticoagulation but it seemed that it was remote history. --Patient is in DVT GI prophylaxis he is on propofol and fentanyl IV fluids please call us with question thank you for this consultation.   Labs   CBC: Recent Labs  Lab 10/23/18 1344  WBC 10.2  NEUTROABS 8.3*  HGB 13.9  HCT 45.6  MCV 85.9  PLT 231    Basic Metabolic Panel: Recent Labs  Lab 10/23/18 1344  NA 138  K 4.4  CL 99  CO2 29  GLUCOSE 103*  BUN 18  CREATININE 1.17  CALCIUM 9.3   GFR: Estimated Creatinine Clearance: 44.5 mL/min (by C-G formula based on SCr of 1.17 mg/dL). Recent Labs  Lab 10/23/18 1344  WBC 10.2    Liver Function Tests: Recent Labs  Lab 10/23/18 1344  AST 23  ALT 18  ALKPHOS 83  BILITOT 0.6  PROT 7.1  ALBUMIN 3.5   Recent Labs  Lab 10/23/18 1344  LIPASE 38   No results for input(s): AMMONIA in the last 168 hours.  ABG No results found for: PHART, PCO2ART, PO2ART, HCO3, TCO2, ACIDBASEDEF, O2SAT   Coagulation Profile: No results for input(s): INR, PROTIME in the last 168 hours.  Cardiac Enzymes: No results for input(s): CKTOTAL, CKMB, CKMBINDEX, TROPONINI in the last 168 hours.  HbA1C: No results found for: HGBA1C  CBG: No results for input(s): GLUCAP in the last 168 hours.  Review of Systems:   Unable to obtain  due to patient's conditions.  Past Medical History  He,  has a past medical history of Arthritis, C. difficile diarrhea, CAD (coronary artery disease) (2005), Carotid artery stenosis, Cognitive impairment, mild, so stated, Colitis, DVT (deep venous thrombosis) (HCC), Erectile dysfunction, GERD (gastroesophageal reflux disease), Hyperlipidemia, Hypertension, Peripheral vascular disease (HCC), Vertebrobasilar artery syndrome, and Vertigo.   Surgical History    Past Surgical History:  Procedure Laterality Date  . APPENDECTOMY     Pt. was 83 yrs old  . COLONOSCOPY    . PR VEIN BYPASS GRAFT,AORTO-FEM-POP  2007     Social History   reports  that he quit smoking about 57 years ago. He has never used smokeless tobacco. He reports current alcohol use of about 14.0 standard drinks of alcohol per week. He reports that he does not use drugs.   Family History   His family history includes Deep vein thrombosis in his father and mother; Diverticulitis in his father; Hyperlipidemia in his father and mother; Hypertension in his father and mother; Stroke in his father and mother; Ulcerative colitis in his father. There is no history of Colon cancer, Colon polyps, Esophageal cancer, Stomach cancer, or Rectal cancer.   Allergies No Known Allergies   Home Medications  Prior to Admission medications   Medication Sig Start Date End Date Taking? Authorizing Provider  acetaminophen (TYLENOL) 500 MG tablet Take 500 mg by mouth 2 (two) times daily.   Yes [provider]  albuterol (PROVENTIL HFA;VENTOLIN HFA) 108 (90 Base) MCG/ACT inhaler Inhale 2 puffs into the lungs every 6 (six) hours as needed for wheezing or shortness of breath. 10/11/18  Yes Sheliah Hatchabori, Katherine E, MD  amLODipine (NORVASC) 5 MG tablet TAKE 1 TABLET BY MOUTH DAILY. PLEASE KEEP UPCOMING APPOINTMENT IN JUNE WITH DOCTOR TURNER FOR FUTURE REFILLS Patient taking differently: Take 5 mg by mouth daily.  07/05/18  Yes Quintella Reicherturner, Traci R, MD  aspirin EC 81 MG tablet Take 81 mg by mouth daily.   Yes [provider]  atorvastatin (LIPITOR) 80 MG tablet TAKE 1 TABLET BY MOUTH DAILY Patient taking differently: Take 80 mg by mouth daily.  04/26/18  Yes Turner, Cornelious Bryantraci R, MD  carvedilol (COREG) 6.25 MG tablet TAKE 1 TABLET BY MOUTH TWICE DAILY Patient taking differently: Take 6.25 mg by mouth 2 (two) times daily.  05/03/18  Yes Turner, Cornelious Bryantraci R, MD  cetirizine (ZYRTEC) 10 MG tablet Take 10 mg by mouth daily.   Yes [provider]  Diclofenac Sodium 2 % SOLN Place 1 application onto the skin 2 (two) times daily.   Yes [provider]  donepezil (ARICEPT) 10 MG tablet TAKE 1  TABLET BY MOUTH DAILY Patient taking differently: Take 10 mg by mouth daily.  09/15/18  Yes Sheliah Hatchabori, Katherine E, MD  Eyelid Cleansers (OCUSOFT EYELID CLEANSING) PADS Place 1 application into both eyes 2 (two) times daily.   Yes [provider]  fish oil-omega-3 fatty acids 1000 MG capsule Take 2 g by mouth 2 (two) times daily.    Yes [provider]  fluticasone (FLONASE) 50 MCG/ACT nasal spray Place 2 sprays into both nostrils daily. Patient taking differently: Place 2 sprays into both nostrils at bedtime.  01/14/18  Yes Sheliah Hatchabori, Katherine E, MD  gabapentin (NEURONTIN) 100 MG capsule Take 2 capsules (200 mg total) by mouth at bedtime. 10/12/18  Yes Antoine PrimasSmith, Zachary M, DO  hydrochlorothiazide (HYDRODIURIL) 25 MG tablet TAKE 1 TABLET BY MOUTH DAILY Patient taking differently: Take 25 mg by mouth daily.  07/05/18  Yes Turner, Cornelious Bryant, MD  meloxicam (MOBIC) 15 MG tablet Take 1 tablet (15 mg total) by mouth daily. 10/22/18  Yes Antoine Primas M, DO  omeprazole (PRILOSEC) 20 MG capsule Take 20 mg by mouth daily.   Yes [provider]  Polyvinyl Alcohol-Povidone (REFRESH OP) Place 1 drop into both eyes 2 (two) times daily.   Yes [provider]  Saw Palmetto 500 MG CAPS Take 1,000 mg by mouth 2 (two) times daily.    Yes [provider]  traMADol (ULTRAM) 50 MG tablet Take 1 tablet (50 mg total) by mouth 4 (four) times daily as needed. Patient taking differently: Take 50 mg by mouth at bedtime as needed (cough).  09/17/18  Yes Iva Boop, MD  omeprazole (PRILOSEC) 40 MG capsule Take 1 capsule (40 mg total) by mouth 2 (two) times daily before a meal. Patient not taking: Reported on 10/23/2018 08/04/18 04/01/19  Iva Boop, MD     Critical care time: Critical care time spent the care of this patient 38 minutes.

## 2018-10-23 NOTE — Progress Notes (Signed)
RN verified the presence of a signed informed consent that matches stated procedure by patient. Verified armband matches patient's stated name and birth date. Verified NPO status (1100 today) and that all jewelry, contact, glasses, dentures, and partials had been removed (if applicable).

## 2018-10-23 NOTE — ED Provider Notes (Signed)
Emergency Department Provider Note   I have reviewed the triage vital signs and the nursing notes.   HISTORY  Chief Complaint Abdominal Pain   HPI [name] is a 83 y.o. male without significant past medical history the presents to the emergency department today secondary to abdominal pain and distention.  Patient states that this started acutely this morning with severe pain and went to urgent care where he had 4 episodes of vomiting which relieved some of his pain but it became more distended and secondary to the amount of pain he was having a sent him here for further evaluation.  Patient has no history of fevers.  He has no recent diarrhea or constipation he last had a bowel movement this morning which was normal. No other associated or modifying symptoms.    Past Medical History:  Diagnosis Date  . Arthritis   . C. difficile diarrhea   . CAD (coronary artery disease) 2005   s/p PCI of RCA  . Carotid artery stenosis    1-39% bilateral by dopplers 10/2016  . Cognitive impairment, mild, so stated   . Colitis   . DVT (deep venous thrombosis) (HCC)   . Erectile dysfunction   . GERD (gastroesophageal reflux disease)   . Hyperlipidemia   . Hypertension   . Peripheral vascular disease (HCC)   . Vertebrobasilar artery syndrome   . Vertigo     Patient Active Problem List   Diagnosis Date Noted  . History of ETT 10/23/2018  . Perforated ulcer (HCC) 10/23/2018  . Endotracheally intubated   . Pneumoperitoneum   . Globus sensation 07/29/2018  . Preoperative cardiovascular examination 04/16/2018  . Arthropathy of right sacroiliac joint 04/08/2016  . Spinal stenosis, lumbar region, with neurogenic claudication 03/26/2016  . Sciatica of right side 06/13/2015  . CAD (coronary artery disease)   . Cerumen impaction 09/15/2013  . Rotator cuff syndrome of left shoulder 01/27/2013  . Carotid artery stenosis 09/02/2012  . Physical exam, annual 06/23/2012  . Chronic cough  11/23/2009  . SKIN LESIONS, MULTIPLE 06/05/2009  . 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 DISEASE, PERIPHERAL VASCULAR NOS 08/02/2007  . VERTIGO 08/02/2007  . Mild cognitive impairment 04/27/2007  . Hyperlipidemia 03/17/2007  . Essential hypertension 03/17/2007    Past Surgical History:  Procedure Laterality Date  . APPENDECTOMY     Pt. was 83 yrs old  . COLONOSCOPY    . PR VEIN BYPASS GRAFT,AORTO-FEM-POP  2007      Allergies Patient has no known allergies.  Family History  Problem Relation Age of Onset  . Stroke Mother   . Hyperlipidemia Mother   . Hypertension Mother   . Deep vein thrombosis Mother   . Stroke Father   . Hyperlipidemia Father   . Hypertension Father   . Deep vein thrombosis Father   . Diverticulitis Father   . Ulcerative colitis Father   . Colon cancer Neg Hx   . Colon polyps Neg Hx   . Esophageal cancer Neg Hx   . Stomach cancer Neg Hx   . Rectal cancer Neg Hx     Social History Social History   Tobacco Use  . Smoking status: Former Smoker    Last attempt to quit: 05/13/1961    Years since quitting: 57.4  . Smokeless tobacco: Never Used  Substance Use Topics  . Alcohol use: Yes    Alcohol/week: 14.0 standard drinks    Types: 14 Glasses of wine per week    Comment: wine  nightly  . Drug use: No    Review of Systems  All other systems negative except as documented in the HPI. All pertinent positives and negatives as reviewed in the HPI. ____________________________________________   PHYSICAL EXAM:  VITAL SIGNS: ED Triage Vitals  Enc Vitals Group     BP 10/23/18 1233 (!) 164/83     Pulse Rate 10/23/18 1233 92     Resp 10/23/18 1233 16     Temp 10/23/18 1233 98.3 F (36.8 C)     Temp Source 10/23/18 1233 Oral     SpO2 10/23/18 1233 96 %     Weight 10/23/18 1242 139 lb 15.9 oz (63.5 kg)     Height 10/23/18 1242 5\' 6"  (1.676 m)    Constitutional: Alert and oriented. Well appearing and in no acute distress. Eyes:  Conjunctivae are normal. PERRL. EOMI. Head: Atraumatic. Nose: No congestion/rhinnorhea. Mouth/Throat: Mucous membranes are dry.  Oropharynx non-erythematous. Neck: No stridor.  No meningeal signs.   Cardiovascular: Normal rate, regular rhythm. Good peripheral circulation. Grossly normal heart sounds.   Respiratory: Normal respiratory effort.  No retractions. Lungs CTAB. Gastrointestinal: Significant midline tenderness with distention.  Borderline rebound.  Borderline peritonitis. Musculoskeletal: No lower extremity tenderness nor edema. No gross deformities of extremities. Neurologic:  Normal speech and language. No gross focal neurologic deficits are appreciated.  Skin:  Skin is warm, dry and intact. No rash noted.   ____________________________________________   LABS (all labs ordered are listed, but only abnormal results are displayed)  Labs Reviewed  CBC WITH DIFFERENTIAL/PLATELET - Abnormal; Notable for the following components:      Result Value   Neutro Abs 8.3 (*)    All other components within normal limits  COMPREHENSIVE METABOLIC PANEL - Abnormal; Notable for the following components:   Glucose, Bld 103 (*)    GFR calc non Af Amer 58 (*)    All other components within normal limits  URINALYSIS, ROUTINE W REFLEX MICROSCOPIC - Abnormal; Notable for the following components:   APPearance HAZY (*)    Protein, ur 30 (*)    Bacteria, UA RARE (*)    All other components within normal limits  BASIC METABOLIC PANEL - Abnormal; Notable for the following components:   Glucose, Bld 115 (*)    Creatinine, Ser 1.67 (*)    Calcium 7.5 (*)    GFR calc non Af Amer 38 (*)    GFR calc Af Amer 44 (*)    All other components within normal limits  CBC - Abnormal; Notable for the following components:   WBC 15.6 (*)    RBC 3.94 (*)    Hemoglobin 10.5 (*)    HCT 33.3 (*)    All other components within normal limits  POCT I-STAT 3, ART BLOOD GAS (G3+) - Abnormal; Notable for the  following components:   pO2, Arterial 151.0 (*)    Bicarbonate 28.8 (*)    Acid-Base Excess 3.0 (*)    All other components within normal limits  MRSA PCR SCREENING  URINE CULTURE  LIPASE, BLOOD  TRIGLYCERIDES  ALLERGENS W/TOTAL IGE AREA 2  TROPONIN I  COMPREHENSIVE METABOLIC PANEL  LACTIC ACID, PLASMA   ____________________________________________  RADIOLOGY  Ct Abdomen Pelvis W Contrast  Result Date: 10/23/2018 CLINICAL DATA:  83 year old with acute onset of generalized abdominal pain, nausea and vomiting, and abdominal distention. Surgical history includes appendectomy EXAM: CT ABDOMEN AND PELVIS WITH CONTRAST TECHNIQUE: Multidetector CT imaging of the abdomen and pelvis was performed using the  standard protocol following bolus administration of intravenous contrast. CONTRAST:  OMNIPAQUE IOHEXOL 300 MG/ML IV. COMPARISON:  None. FINDINGS: Lower chest: Upper normal heart size. Apparent prior RIGHT coronary artery stenting. No pericardial effusion. 11 mm mean diameter nodule deep in the RIGHT LOWER LOBE adjacent to the RIGHT hemidiaphragm (series 4, image 5). Minimal dependent atelectasis posteriorly in the lower lobes. Hepatobiliary: Liver normal in size and appearance. Gallbladder normal in appearance without calcified gallstones. No biliary ductal dilation. Pancreas: Normal in appearance without evidence of mass, ductal dilation, or inflammation. Spleen: Normal in size and appearance. Adrenals/Urinary Tract: Normal appearing adrenal glands. Kidneys normal in size and appearance without focal parenchymal abnormality. No hydronephrosis. No evidence of urinary tract calculi. Markedly distended urinary bladder with a large wide-mouth diverticulum arising from the RIGHT superolateral wall at the bladder dome and a small diverticulum arising from the RIGHT posterolateral wall near the base of the bladder. Stomach/Bowel: Marked thickening of the wall of the gastric antrum and pylorus. Stomach  otherwise normal in appearance. Small amount of fluid in the distal esophagus without evidence of hiatal hernia. Small diverticulum arising medially from the junction of the second and third portion of the duodenum. Small bowel otherwise normal in appearance. Moderate stool burden in the ascending colon. Scattered sigmoid colon diverticula without evidence of acute diverticulitis. No visible colonic masses. Appendix surgically absent, though what is either a small diverticulum or the appendiceal stump is identified at the cecal tip. Vascular/Lymphatic: Moderate to severe aortoiliofemoral atherosclerosis without evidence of aneurysm. Normal-appearing portal venous and systemic venous systems. No pathologic lymphadenopathy. Reproductive: Markedly enlarged prostate gland containing calcifications. Asymmetric enlargement of the LEFT seminal vesicle. Other: Small amount of free intraperitoneal air as there are numerous extraluminal gas bubbles in the upper abdomen, adjacent to the stomach and beneath the LEFT hemidiaphragm. Small amount of ascites in the perihepatic region and the RIGHT side of the pelvis. Soft tissue thickening/mass along the LEFT LATERAL peritoneum and possibly to a lesser degree the RIGHT LATERAL peritoneum. Musculoskeletal: Severe degenerative disc disease, spondylosis and facet degenerative changes throughout the lumbar spine. No acute findings. Dystrophic calcifications adjacent to the ischia bilaterally. IMPRESSION: 1. Pneumoperitoneum, likely related to # 2 below. 2. Marked thickening of the wall of the gastric antrum and pylorus, likely indicating gastritis/peptic ulcer disease. 3. Soft tissue thickening/mass along the LEFT LATERAL peritoneum and possibly to a lesser degree the RIGHT LATERAL peritoneum, likely indicating peritoneal carcinomatosis. 4. Marked distention of the urinary bladder with a large wide-mouth diverticulum arising from the RIGHT superolateral wall at the bladder dome and a  small RIGHT posterolateral wall diverticulum near the bladder base. 5. Markedly enlarged prostate gland containing calcifications. Asymmetric enlargement of the LEFT seminal vesicle. 6. 11 mm mean diameter nodule deep in the RIGHT LOWER LOBE adjacent to the RIGHT hemidiaphragm. Dedicated CT chest is recommended in further evaluation when the patient has recovered from the current acute emergency. I telephoned these emergent/critical results to Dr. Patria Mane in the emergency department at the time of interpretation on 10/23/2018 at 4:20 p.m. Electronically Signed   By: Hulan Saas M.D.   On: 10/23/2018 16:30   Dg Chest Port 1 View  Result Date: 10/24/2018 CLINICAL DATA:  83 year old male status post intubation. EXAM: PORTABLE CHEST 1 VIEW COMPARISON:  Chest x-ray 10/23/2018. FINDINGS: An endotracheal tube is in place with tip 3.0 cm above the carina. A nasogastric tube is seen extending into the stomach, however, the tip of the nasogastric tube extends below the lower  margin of the image. Lung volumes are low. No consolidative airspace disease. No pleural effusions. Linear opacities throughout the mid to lower lungs bilaterally, similar to the prior study, which could reflect areas of subsegmental atelectasis or scarring. No evidence of pulmonary edema. Heart size is normal. Upper mediastinal contours are within normal limits. IMPRESSION: 1. Support apparatus, as above. 2. Areas of subsegmental atelectasis or scarring in the lung bases bilaterally. 3. Aortic atherosclerosis. Electronically Signed   By: Trudie Reedaniel  Entrikin M.D.   On: 10/24/2018 08:38   Dg Chest Port 1 View  Result Date: 10/23/2018 CLINICAL DATA:  Endotracheal intubation. EXAM: PORTABLE CHEST 1 VIEW COMPARISON:  Radiographs of Mar 10, 2018. FINDINGS: The heart size and mediastinal contours are within normal limits. Endotracheal and nasogastric tubes are in grossly good position. No pneumothorax or pleural effusion is noted. Minimal bibasilar  subsegmental atelectasis is noted. The visualized skeletal structures are unremarkable. IMPRESSION: Endotracheal and nasogastric tubes are in grossly good position. Minimal bibasilar subsegmental atelectasis. Aortic Atherosclerosis (ICD10-I70.0). Electronically Signed   By: Lupita RaiderJames  Green Jr, M.D.   On: 10/23/2018 21:00    ____________________________________________   PROCEDURES  Procedure(s) performed:   Procedures   ____________________________________________   INITIAL IMPRESSION / ASSESSMENT AND PLAN / ED COURSE  Concern for surgical abdomen secondary to borderline peritonitis.  Patient has no history of suggest what it might be caused by.  Patient checked out to oncoming physician pending CT scan and further management.     Pertinent labs & imaging results that were available during my care of the patient were reviewed by me and considered in my medical decision making (see chart for details).  ____________________________________________  FINAL CLINICAL IMPRESSION(S) / ED DIAGNOSES  Final diagnoses:  Pneumoperitoneum  Urinary retention     MEDICATIONS GIVEN DURING THIS VISIT:  Medications  morphine 4 MG/ML injection 4 mg ( Intravenous MAR Unhold 10/23/18 1945)  lactated ringers infusion ( Intravenous Stopped 10/23/18 1915)  enoxaparin (LOVENOX) injection 40 mg (40 mg Subcutaneous Given 10/24/18 0807)  ondansetron (ZOFRAN-ODT) disintegrating tablet 4 mg ( Oral See Alternative 10/23/18 2240)    Or  ondansetron (ZOFRAN) injection 4 mg (4 mg Intravenous Given 10/23/18 2240)  pantoprazole (PROTONIX) injection 40 mg (40 mg Intravenous Given 10/24/18 0909)  piperacillin-tazobactam (ZOSYN) IVPB 3.375 g ( Intravenous Rate/Dose Verify 10/24/18 1200)  dextrose 5 % in lactated ringers infusion (has no administration in time range)  ipratropium-albuterol (DUONEB) 0.5-2.5 (3) MG/3ML nebulizer solution 3 mL (3 mLs Nebulization Given 10/24/18 1120)  chlorhexidine (PERIDEX) 0.12 %  solution 15 mL (15 mLs Mouth Rinse Not Given 10/24/18 1102)  MEDLINE mouth rinse (15 mLs Mouth Rinse Given 10/24/18 1211)  budesonide (PULMICORT) nebulizer solution 0.25 mg (has no administration in time range)  fentaNYL (SUBLIMAZE) injection 50 mcg (50 mcg Intravenous Given 10/23/18 1346)  lactated ringers bolus 1,000 mL (0 mLs Intravenous Stopped 10/23/18 1535)  iohexol (OMNIPAQUE) 300 MG/ML solution 100 mL (100 mLs Intravenous Contrast Given 10/23/18 1532)  piperacillin-tazobactam (ZOSYN) IVPB 3.375 g (0 g Intravenous Stopped 10/23/18 1711)  acetaminophen (OFIRMEV) IV 1,000 mg (1,000 mg Intravenous New Bag/Given 10/24/18 1211)  fentaNYL (SUBLIMAZE) injection 50 mcg (50 mcg Intravenous Given 10/24/18 0917)  sodium chloride 0.9 % bolus 1,000 mL ( Intravenous Restarted 10/24/18 0000)  lactated ringers bolus 1,000 mL ( Intravenous Rate/Dose Verify 10/24/18 1200)  phenylephrine (NEOSYNEPHRINE) 10-0.9 MG/250ML-% infusion (10 mg  New Bag/Given 10/24/18 1135)     NEW OUTPATIENT MEDICATIONS STARTED DURING THIS VISIT:  Current Discharge Medication List  Note:  This note was prepared with assistance of Dragon voice recognition software. Occasional wrong-word or sound-a-like substitutions may have occurred due to the inherent limitations of voice recognition software.   Marily Memos, MD 10/24/18 1319

## 2018-10-23 NOTE — ED Notes (Signed)
Got patient into a gown on the monitor did ekg shown to Dr Mesner patient is resting with call bell in reach 

## 2018-10-23 NOTE — Transfer of Care (Signed)
Immediate Anesthesia Transfer of Care Note  Patient: [name]  Procedure(s) Performed: EXPLORATORY LAPAROTOMY WITH GRAHAM PATCH FOR PERFORATED ULCER AND PLACEMENT OF GASTRIC TUBE (N/A )  Patient Location: SICU  Anesthesia Type:General  Level of Consciousness: Patient remains intubated per anesthesia plan  Airway & Oxygen Therapy: Patient placed on Ventilator (see vital sign flow sheet for setting)  Post-op Assessment: Report given to RN and Post -op Vital signs reviewed and stable  Post vital signs: Reviewed and stable  Last Vitals:  Vitals Value Taken Time  BP 124/57 10/23/2018  7:45 PM  Temp    Pulse 83 10/23/2018  7:50 PM  Resp 15 10/23/2018  7:50 PM  SpO2 100 % 10/23/2018  7:50 PM  Vitals shown include unvalidated device data.  Last Pain:  Vitals:   10/23/18 1715  TempSrc:   PainSc: 4       Patients Stated Pain Goal: 0 (10/23/18 1715)  Complications: No apparent anesthesia complications

## 2018-10-23 NOTE — ED Triage Notes (Signed)
PT reans from [facility] on [address] Pt presented there this AM with ABD distension and N/V. Pt ADB is hard and tender to touch. Pt denies any bleeding .

## 2018-10-23 NOTE — Progress Notes (Signed)
Pharmacy Antibiotic Note  [name] is a 83 y.o. male admitted on 10/23/2018 with intra-abdominal infection.  Pharmacy has been consulted for zosyn dosing.  Presented with acute onset upper abdominal pain >> CT showing likely upper GI perforation. Now s/p exploratory laparotomy with [name] patch repair of perforated ulcer. WBC WNL, afeb. Scr 1.17 (CrCl 44 mL/min).  Plan: Zosyn 3.375g IV q8h (4 hour infusion).  Monitor cx results, clinical pic, and LOT  Height: 5\' 6"  (167.6 cm) Weight: 139 lb 15.9 oz (63.5 kg) IBW/kg (Calculated) : 63.8  Temp (24hrs), Avg:98.3 F (36.8 C), Min:98.3 F (36.8 C), Max:98.3 F (36.8 C)  Recent Labs  Lab 10/23/18 1344  WBC 10.2  CREATININE 1.17    Estimated Creatinine Clearance: 44.5 mL/min (by C-G formula based on SCr of 1.17 mg/dL).    No Known Allergies  Antimicrobials this admission: Zosyn 12/28 >>    Dose adjustments this admission: N/A  Microbiology results: 12/28 UCx: sent   Thank you for allowing pharmacy to be a part of this patient's care.  Sherron MondayKimberly Graham Hyun, PharmD, BCCCP Clinical Pharmacist  Pager: [phone] Phone: [phone] 10/23/2018 7:51 PM

## 2018-10-23 NOTE — ED Provider Notes (Signed)
Care from Dr Erin HearingMessner. abd pain. Pending CT. Family reports pts pain is improving with ED pain mgmt.    [name], Robert Lingenfelter, MD 10/23/18 [id number]

## 2018-10-23 NOTE — ED Notes (Signed)
Patient transported to CT 

## 2018-10-23 NOTE — Progress Notes (Signed)
Family took all belongings. Patient reports being in severe pain. CRNA at bedside and made aware.

## 2018-10-23 NOTE — Op Note (Signed)
Preoperative diagnosis: Pneumoperitoneum Postoperative diagnosis: Perforated pyloric channel ulcer Procedure: Exploratory laparotomy with [name] patch repair of perforated ulcer with omentum, Stamm gastrostomy tube placement Surgeon: Dr. Harden MoMatt Caliyah Sieh Anesthesia: General Estimated blood loss: 75 cc Specimens: None Drains: 19 French Blake drain to right upper quadrant Complications: None Special count was correct at completion Disposition to recovery in stable condition  Indications: This is a an 83 year old male with acute onset of abdominal pain this morning.  He was evaluated and on CT scan he had a pneumoperitoneum with that appeared to be a perforated ulcer.  I discussed with him going to the operating room as he had peritonitis.  Procedure: After informed consent was obtained the patient was taken to the operating room.  He was given antibiotics.  SCDs were in place.  He was placed under general anesthesia without complication.  Foley catheter and a nasogastric tube were then placed.  He is up was prepped and draped in the standard sterile surgical fashion.  A surgical timeout was then performed.  I made an upper midline incision.  Once I had entered there was some air as well as a fair amount of bilious contents in mostly his right upper quadrant but also down his right gutter as well as in his left upper quadrant.  There was a lot of exudate all around his stomach.  I was able to evacuate all of the bilious contents.  With some difficulty eventually I was able to identify a small perforation of the pyloric channel.  I did evaluate the entire anterior stomach as well as the lesser curve to ensure that this was the location and this was the only perforation.  I debrided some of the fat that was stuck on the stomach.  Once I had done this I then closed a small 5 mm perforation with a 2-0 silk suture.  I then prepared a tongue of omentum using the LigaSure device.  I then placed some sutures of  2-0 silk around the perforation.  I laid the tongue of omentum between these covering the perforation.  I then secured the omental patch with a 2-0 silk sutures.  I tacked this down several other times.  This appeared to be in good position.  I did insufflate air via the NG tube and there was no more leakage from anywhere.  I then placed a 2419 [place]French Blake drain just above this below the liver.  I secured this with a 2-0 nylon suture.  Due to the difficulty he has had swallowing I elected to place a gastrostomy tube.  I placed 2 pursestring sutures of 2-0 silk in the stomach at a point where it would easily pull up the abdominal wall.  I then placed a 24 French Malecot tube through the abdominal wall.  I then placed this into the stomach.  I had return of gastric contents prior to placing it.  I then secured the pursestrings.  I then used 2-0 silk suture to tack the stomach to the anterior abdominal wall.  This was then placed to a Foley bag and secured with a 2-0 nylon suture.  Copious irrigation was then performed.  I then closed the fascia with #1 looped PDS.  I left the wound open and placed a wet-to-dry.  Sterile dressings were placed.  Due to his condition preoperatively and the surgery we elected to leave him intubated and I will have ICU team see him postoperatively.  He otherwise tolerated this well.

## 2018-10-23 NOTE — Progress Notes (Signed)
eLink Physician-Brief Progress Note Patient Name: Robert SimplerJack Netherton DOB: [birth year] MRN: [id number]   Date of Service  10/23/2018  HPI/Events of Note  Hypotension - BP tanks with adequate sedation. Nurse request Norepinephrine. No CVL.  eICU Interventions  Will order: 1. Phenylephrine IV infusion. Titrate to MAP >= 65. 2. Bolus with 0.9 NaCl 1 liter IV over 1 hour now.      Intervention Category Major Interventions: Hypotension - evaluation and management  Sommer,Steven Eugene 10/23/2018, 11:01 PM

## 2018-10-23 NOTE — H&P (Signed)
[name] is an 83 y.o. male.   Chief Complaint: abd pain HPI: 61 yom with history of prior dvt, cad with stent 15 years ago no issues currently who also has undergone swallowing eval recently.  He noted acute onset upper abd pain today that worsened. Hurt to move or touch. Nothing making better. No n/v. No fever. Nothing like this before. Drinks some wine. No smoking, no nsaid use really he has.  He was seen in er and underwent ct scan that shows likely upper gi perforation and I was asked to see him. His wife and his son are present with him in the ER.  Past Medical History:  Diagnosis Date  . Arthritis   . C. difficile diarrhea   . CAD (coronary artery disease) 2005   s/p PCI of RCA  . Carotid artery stenosis    1-39% bilateral by dopplers 10/2016  . Cognitive impairment, mild, so stated   . Colitis   . DVT (deep venous thrombosis) (HCC)   . Erectile dysfunction   . GERD (gastroesophageal reflux disease)   . Hyperlipidemia   . Hypertension   . Peripheral vascular disease (HCC)   . Vertebrobasilar artery syndrome   . Vertigo     Past Surgical History:  Procedure Laterality Date  . APPENDECTOMY     Pt. was 83 yrs old  . COLONOSCOPY    . PR VEIN BYPASS GRAFT,AORTO-FEM-POP  2007    Family History  Problem Relation Age of Onset  . Stroke Mother   . Hyperlipidemia Mother   . Hypertension Mother   . Deep vein thrombosis Mother   . Stroke Father   . Hyperlipidemia Father   . Hypertension Father   . Deep vein thrombosis Father   . Diverticulitis Father   . Ulcerative colitis Father   . Colon cancer Neg Hx   . Colon polyps Neg Hx   . Esophageal cancer Neg Hx   . Stomach cancer Neg Hx   . Rectal cancer Neg Hx    Social History:  reports that he quit smoking about 57 years ago. He has never used smokeless tobacco. He reports current alcohol use of about 14.0 standard drinks of alcohol per week. He reports that he does not use drugs.  Allergies: No Known Allergies  meds  reviewed  Results for orders placed or performed during the hospital encounter of 10/23/18 (from the past 48 hour(s))  CBC with Differential     Status: Abnormal   Collection Time: 10/23/18  1:44 PM  Result Value Ref Range   WBC 10.2 4.0 - 10.5 K/uL   RBC 5.31 4.22 - 5.81 MIL/uL   Hemoglobin 13.9 13.0 - 17.0 g/dL   HCT 40.9 81.1 - 91.4 %   MCV 85.9 80.0 - 100.0 fL   MCH 26.2 26.0 - 34.0 pg   MCHC 30.5 30.0 - 36.0 g/dL   RDW 78.2 95.6 - 21.3 %   Platelets 231 150 - 400 K/uL   nRBC 0.0 0.0 - 0.2 %   Neutrophils Relative % 81 %   Neutro Abs 8.3 (H) 1.7 - 7.7 K/uL   Lymphocytes Relative 12 %   Lymphs Abs 1.2 0.7 - 4.0 K/uL   Monocytes Relative 6 %   Monocytes Absolute 0.6 0.1 - 1.0 K/uL   Eosinophils Relative 1 %   Eosinophils Absolute 0.1 0.0 - 0.5 K/uL   Basophils Relative 0 %   Basophils Absolute 0.0 0.0 - 0.1 K/uL   Immature Granulocytes 0 %  Abs Immature Granulocytes 0.03 0.00 - 0.07 K/uL    Comment: Performed at [facility], [address] [place], [place] [address]  Comprehensive metabolic panel     Status: Abnormal   Collection Time: 10/23/18  1:44 PM  Result Value Ref Range   Sodium 138 135 - 145 mmol/L   Potassium 4.4 3.5 - 5.1 mmol/L   Chloride 99 98 - 111 mmol/L   CO2 29 22 - 32 mmol/L   Glucose, Bld 103 (H) 70 - 99 mg/dL   BUN 18 8 - 23 mg/dL   Creatinine, Ser 6.041.17 0.61 - 1.24 mg/dL   Calcium 9.3 8.9 - 54.010.3 mg/dL   Total Protein 7.1 6.5 - 8.1 g/dL   Albumin 3.5 3.5 - 5.0 g/dL   AST 23 15 - 41 U/L   ALT 18 0 - 44 U/L   Alkaline Phosphatase 83 38 - 126 U/L   Total Bilirubin 0.6 0.3 - 1.2 mg/dL   GFR calc non Af Amer 58 (L) >60 mL/min   GFR calc Af Amer >60 >60 mL/min   Anion gap 10 5 - 15    Comment: Performed at [facility], [address] [place], [place] [address]  Lipase, blood     Status: None   Collection Time: 10/23/18  1:44 PM  Result Value Ref Range   Lipase 38 11 - 51 U/L    Comment: Performed at [facility], [address] [place], [place] [address]  Urinalysis, Routine w reflex microscopic     Status: Abnormal   Collection Time: 10/23/18  1:44 PM  Result Value Ref Range   Color, Urine YELLOW YELLOW   APPearance HAZY (A) CLEAR   Specific Gravity, Urine 1.020 1.005 - 1.030   pH 6.0 5.0 - 8.0   Glucose, UA NEGATIVE NEGATIVE mg/dL   Hgb urine dipstick NEGATIVE NEGATIVE   Bilirubin Urine NEGATIVE NEGATIVE   Ketones, ur NEGATIVE NEGATIVE mg/dL   Protein, ur 30 (A) NEGATIVE mg/dL   Nitrite NEGATIVE NEGATIVE   Leukocytes, UA NEGATIVE NEGATIVE   RBC / HPF 0-5 0 - 5 RBC/hpf   WBC, UA 0-5 0 - 5 WBC/hpf   Bacteria, UA RARE (A) NONE SEEN   Squamous Epithelial / LPF 0-5 0 - 5   Mucus PRESENT    Sperm, UA PRESENT     Comment: Performed at [facility], [address] [place], [place] [address]   Ct Abdomen Pelvis W Contrast  Result Date: 10/23/2018 CLINICAL DATA:  83 year old with acute onset of generalized abdominal pain, nausea and vomiting, and abdominal distention. Surgical history includes appendectomy EXAM: CT ABDOMEN AND PELVIS WITH CONTRAST TECHNIQUE: Multidetector CT imaging of the abdomen and pelvis was performed using the standard protocol following bolus administration of intravenous contrast. CONTRAST:  100mL OMNIPAQUE IOHEXOL 300 MG/ML IV. COMPARISON:  None. FINDINGS: Lower chest: Upper normal heart size. Apparent prior RIGHT coronary artery stenting. No pericardial effusion. 11 mm mean diameter nodule deep in the RIGHT LOWER LOBE adjacent to the RIGHT hemidiaphragm (series 4, image 5). Minimal dependent atelectasis posteriorly in the lower lobes. Hepatobiliary: Liver normal in size and appearance. Gallbladder normal in appearance without calcified gallstones. No biliary ductal dilation. Pancreas: Normal in appearance without evidence of mass, ductal dilation, or inflammation. Spleen: Normal in size and appearance. Adrenals/Urinary Tract: Normal appearing adrenal glands. Kidneys normal in size and  appearance without focal parenchymal abnormality. No hydronephrosis. No evidence of urinary tract calculi. Markedly  distended urinary bladder with a large wide-mouth diverticulum arising from the RIGHT superolateral wall at the bladder dome and a small diverticulum arising from the RIGHT posterolateral wall near the base of the bladder. Stomach/Bowel: Marked thickening of the wall of the gastric antrum and pylorus. Stomach otherwise normal in appearance. Small amount of fluid in the distal esophagus without evidence of hiatal hernia. Small diverticulum arising medially from the junction of the second and third portion of the duodenum. Small bowel otherwise normal in appearance. Moderate stool burden in the ascending colon. Scattered sigmoid colon diverticula without evidence of acute diverticulitis. No visible colonic masses. Appendix surgically absent, though what is either a small diverticulum or the appendiceal stump is identified at the cecal tip. Vascular/Lymphatic: Moderate to severe aortoiliofemoral atherosclerosis without evidence of aneurysm. Normal-appearing portal venous and systemic venous systems. No pathologic lymphadenopathy. Reproductive: Markedly enlarged prostate gland containing calcifications. Asymmetric enlargement of the LEFT seminal vesicle. Other: Small amount of free intraperitoneal air as there are numerous extraluminal gas bubbles in the upper abdomen, adjacent to the stomach and beneath the LEFT hemidiaphragm. Small amount of ascites in the perihepatic region and the RIGHT side of the pelvis. Soft tissue thickening/mass along the LEFT LATERAL peritoneum and possibly to a lesser degree the RIGHT LATERAL peritoneum. Musculoskeletal: Severe degenerative disc disease, spondylosis and facet degenerative changes throughout the lumbar spine. No acute findings. Dystrophic calcifications adjacent to the ischia bilaterally. IMPRESSION: 1. Pneumoperitoneum, likely related to # 2 below. 2. Marked  thickening of the wall of the gastric antrum and pylorus, likely indicating gastritis/peptic ulcer disease. 3. Soft tissue thickening/mass along the LEFT LATERAL peritoneum and possibly to a lesser degree the RIGHT LATERAL peritoneum, likely indicating peritoneal carcinomatosis. 4. Marked distention of the urinary bladder with a large wide-mouth diverticulum arising from the RIGHT superolateral wall at the bladder dome and a small RIGHT posterolateral wall diverticulum near the bladder base. 5. Markedly enlarged prostate gland containing calcifications. Asymmetric enlargement of the LEFT seminal vesicle. 6. 11 mm mean diameter nodule deep in the RIGHT LOWER LOBE adjacent to the RIGHT hemidiaphragm. Dedicated CT chest is recommended in further evaluation when the patient has recovered from the current acute emergency. I telephoned these emergent/critical results to Dr. Patria Maneampos in the emergency department at the time of interpretation on 10/23/2018 at 4:20 p.m. Electronically Signed   By: Hulan Saashomas  Lawrence M.D.   On: 10/23/2018 16:30    Review of Systems  Respiratory: Positive for cough (for one year).   Gastrointestinal: Positive for abdominal pain.  All other systems reviewed and are negative.   Blood pressure (!) 163/74, pulse (!) 102, temperature 98.3 F (36.8 C), temperature source Oral, resp. rate (!) 28, height 5\' 6"  (1.676 m), weight 63.5 kg, SpO2 96 %. Physical Exam  Vitals reviewed. Constitutional: He is oriented to person, place, and time. He appears well-developed and well-nourished. No distress.  HENT:  Head: Normocephalic and atraumatic.  Right Ear: External ear normal.  Mouth/Throat: Oropharynx is clear and moist.  Eyes: Pupils are equal, round, and reactive to light. No scleral icterus.  Neck: Neck supple.  Cardiovascular: Normal rate, regular rhythm, normal heart sounds and intact distal pulses.  Respiratory: Effort normal and breath sounds normal. He has no wheezes.  GI: He  exhibits distension. Bowel sounds are decreased. There is abdominal tenderness in the right upper quadrant, right lower quadrant and epigastric area. There is rigidity. No hernia.  Genitourinary:    Penis normal.   Musculoskeletal:  General: No tenderness or edema.  Lymphadenopathy:    He has no cervical adenopathy.  Neurological: He is alert and oriented to person, place, and time. He has normal strength. GCS eye subscore is 4. GCS verbal subscore is 5. GCS motor subscore is 6.  Skin: Skin is warm and dry. He is not diaphoretic.  Psychiatric: His behavior is normal. Thought content normal.     Assessment/Plan Perforated viscus with peritonitis  Likely upper gi source. I discussed elap with repair or resection depending on what is perforated. I also discussed g tube placement at same time due to his swallowing issues recently and weight loss.  Risks include but not limited to bleeding, infection, reoperation, continued leakage, mi, arrythmia, pulm issues with prolonged intubation, stroke and death. They understand there is no real other good choice and will proceed asap.   Emelia Loron, MD 10/23/2018, 5:01 PM

## 2018-10-23 NOTE — Anesthesia Preprocedure Evaluation (Addendum)
Anesthesia Evaluation  Patient identified by MRN, date of birth, ID band Patient awake    Reviewed: Allergy & Precautions, NPO status , Patient's Chart, lab work & pertinent test results  Airway Mallampati: III  TM Distance: >3 FB Neck ROM: Full    Dental  (+) Teeth Intact, Dental Advisory Given   Pulmonary former smoker,    breath sounds clear to auscultation       Cardiovascular hypertension, Pt. on home beta blockers and Pt. on medications + CAD, + Cardiac Stents and + Peripheral Vascular Disease   Rhythm:Regular Rate:Normal     Neuro/Psych  Neuromuscular disease    GI/Hepatic Neg liver ROS, GERD  Medicated,  Endo/Other  negative endocrine ROS  Renal/GU negative Renal ROS     Musculoskeletal  (+) Arthritis , Osteoarthritis,    Abdominal (+)  Abdomen: tender.    Peds  Hematology negative hematology ROS (+)   Anesthesia Other Findings   Reproductive/Obstetrics                            Lab Results  Component Value Date   WBC 10.2 10/23/2018   HGB 13.9 10/23/2018   HCT 45.6 10/23/2018   MCV 85.9 10/23/2018   PLT 231 10/23/2018   Lab Results  Component Value Date   CREATININE 1.17 10/23/2018   BUN 18 10/23/2018   NA 138 10/23/2018   K 4.4 10/23/2018   CL 99 10/23/2018   CO2 29 10/23/2018   No results found for: INR, PROTIME  Echo: - Left ventricle: The cavity size was normal. There was mild concentric hypertrophy. Systolic function was vigorous. The estimated ejection fraction was in the range of 65% to 70%. Wall motion was normal; there were no regional wall motion abnormalities. Doppler parameters are consistent with abnormal left ventricular relaxation (grade 1 diastolic dysfunction). There was no evidence of elevated ventricular filling pressure by Doppler parameters. - Aortic valve: There was no regurgitation. - Aortic root: The aortic root was normal  in size. - Mitral valve: There was no regurgitation. - Left atrium: The atrium was normal in size. - Right ventricle: Systolic function was normal. - Right atrium: The atrium was normal in size. - Tricuspid valve: There was mild regurgitation. - Pulmonic valve: There was no regurgitation. - Pulmonary arteries: Systolic pressure was within the normal range. - Inferior vena cava: The vessel was normal in size. - Pericardium, extracardiac: There was no pericardial effusion.  Anesthesia Physical Anesthesia Plan  ASA: III and emergent  Anesthesia Plan: General   Post-op Pain Management:    Induction: Intravenous, Rapid sequence and Cricoid pressure planned  PONV Risk Score and Plan: 3 and Ondansetron and Treatment may vary due to age or medical condition  Airway Management Planned: Oral ETT  Additional Equipment:   Intra-op Plan:   Post-operative Plan: Possible Post-op intubation/ventilation  Informed Consent: I have reviewed the patients History and Physical, chart, labs and discussed the procedure including the risks, benefits and alternatives for the proposed anesthesia with the patient or authorized representative who has indicated his/her understanding and acceptance.   Dental advisory given  Plan Discussed with: CRNA  Anesthesia Plan Comments:        Anesthesia Quick Evaluation

## 2018-10-23 NOTE — ED Provider Notes (Signed)
Pneumoperitoneum seen on CT imaging.  Likely free fluid related to this in the right upper quadrant.  Bladder distention noted.  Able to urinate at the bedside but only a small amount.  Foley catheter placed as this will need to be decompressed for surgery.  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  Case discussed with Dr. Dwain SarnaWakefield of general surgery who will plan on operative management of this patient's acute surgical issue.  IV Zosyn now.  Patient and family updated.  Questional peritoneal implant.  I have not discussed this with family.  Dr. Dwain SarnaWakefield will examine further when in the abdomen and inform family based on his findings on intraoperative visualization  I discussed the case with radiology and personally reviewed the patient's CT images  Consultants: Dr. Reece PackerWakefield-General surgery Dr. Lawrence-radiology  Diagnosis: Pneumoperitoneum  Disposition: Operating room  Family updated  .Critical Care Performed by: Azalia Bilisampos, Kipp Shank, MD Authorized by: Azalia Bilisampos, Raechal Raben, MD   Critical care provider statement:    Critical care time (minutes):  35   Critical care was time spent personally by me on the following activities:  Discussions with consultants, evaluation of patient's response to treatment, examination of patient, ordering and performing treatments and interventions, ordering and review of laboratory studies, ordering and review of radiographic studies, pulse oximetry, re-evaluation of patient's condition, obtaining history from patient or surrogate and review of old charts     Ct Abdomen Pelvis W Contrast  Result Date: 10/23/2018 CLINICAL DATA:  83 year old with acute onset of generalized abdominal pain, nausea and vomiting, and abdominal distention. Surgical history includes appendectomy EXAM: CT ABDOMEN AND PELVIS WITH CONTRAST TECHNIQUE: Multidetector CT imaging of the abdomen and pelvis was performed using the standard protocol  following bolus administration of intravenous contrast. CONTRAST:  100mL OMNIPAQUE IOHEXOL 300 MG/ML IV. COMPARISON:  None. FINDINGS: Lower chest: Upper normal heart size. Apparent prior RIGHT coronary artery stenting. No pericardial effusion. 11 mm mean diameter nodule deep in the RIGHT LOWER LOBE adjacent to the RIGHT hemidiaphragm (series 4, image 5). Minimal dependent atelectasis posteriorly in the lower lobes. Hepatobiliary: Liver normal in size and appearance. Gallbladder normal in appearance without calcified gallstones. No biliary ductal dilation. Pancreas: Normal in appearance without evidence of mass, ductal dilation, or inflammation. Spleen: Normal in size and appearance. Adrenals/Urinary Tract: Normal appearing adrenal glands. Kidneys normal in size and appearance without focal parenchymal abnormality. No hydronephrosis. No evidence of urinary tract calculi. Markedly distended urinary bladder with a large wide-mouth diverticulum arising from the RIGHT superolateral wall at the bladder dome and a small diverticulum arising from the RIGHT posterolateral wall near the base of the bladder. Stomach/Bowel: Marked thickening of the wall of the gastric antrum and pylorus. Stomach otherwise normal in appearance. Small amount of fluid in the distal esophagus without evidence of hiatal hernia. Small diverticulum arising medially from the junction of the second and third portion of the duodenum. Small bowel otherwise normal in appearance. Moderate stool burden in the ascending colon. Scattered sigmoid colon diverticula without evidence of acute diverticulitis. No visible colonic masses. Appendix surgically absent, though what is either a small diverticulum or the appendiceal stump is identified at the cecal tip. Vascular/Lymphatic: Moderate to severe aortoiliofemoral atherosclerosis without evidence of aneurysm. Normal-appearing portal venous and systemic venous systems. No pathologic lymphadenopathy. Reproductive:  Markedly enlarged prostate gland containing calcifications. Asymmetric enlargement of the LEFT seminal vesicle. Other: Small amount of free intraperitoneal air as there are numerous extraluminal gas bubbles in the upper abdomen,  adjacent to the stomach and beneath the LEFT hemidiaphragm. Small amount of ascites in the perihepatic region and the RIGHT side of the pelvis. Soft tissue thickening/mass along the LEFT LATERAL peritoneum and possibly to a lesser degree the RIGHT LATERAL peritoneum. Musculoskeletal: Severe degenerative disc disease, spondylosis and facet degenerative changes throughout the lumbar spine. No acute findings. Dystrophic calcifications adjacent to the ischia bilaterally. IMPRESSION: 1. Pneumoperitoneum, likely related to # 2 below. 2. Marked thickening of the wall of the gastric antrum and pylorus, likely indicating gastritis/peptic ulcer disease. 3. Soft tissue thickening/mass along the LEFT LATERAL peritoneum and possibly to a lesser degree the RIGHT LATERAL peritoneum, likely indicating peritoneal carcinomatosis. 4. Marked distention of the urinary bladder with a large wide-mouth diverticulum arising from the RIGHT superolateral wall at the bladder dome and a small RIGHT posterolateral wall diverticulum near the bladder base. 5. Markedly enlarged prostate gland containing calcifications. Asymmetric enlargement of the LEFT seminal vesicle. 6. 11 mm mean diameter nodule deep in the RIGHT LOWER LOBE adjacent to the RIGHT hemidiaphragm. Dedicated CT chest is recommended in further evaluation when the patient has recovered from the current acute emergency. I telephoned these emergent/critical results to Dr. Patria Maneampos in the emergency department at the time of interpretation on 10/23/2018 at 4:20 p.m. Electronically Signed   By: Hulan Saashomas  Lawrence M.D.   On: 10/23/2018 16:30      Azalia Bilisampos, Windie Marasco, MD 10/23/18 [phone]

## 2018-10-23 NOTE — Anesthesia Procedure Notes (Signed)
Procedure Name: Intubation Date/Time: 10/23/2018 6:12 PM Performed by: Cleda Daub, CRNA Pre-anesthesia Checklist: Patient identified, Emergency Drugs available, Suction available and Patient being monitored Patient Re-evaluated:Patient Re-evaluated prior to induction Oxygen Delivery Method: Circle system utilized Preoxygenation: Pre-oxygenation with 100% oxygen Induction Type: IV induction Ventilation: Mask ventilation without difficulty and Mask ventilation throughout procedure Laryngoscope Size: Mac and 3 Grade View: Grade I Tube type: Oral Tube size: 7.5 mm Number of attempts: 1 Airway Equipment and Method: Stylet Placement Confirmation: ETT inserted through vocal cords under direct vision,  positive ETCO2 and breath sounds checked- equal and bilateral Secured at: 21 cm Tube secured with: Tape Dental Injury: Teeth and Oropharynx as per pre-operative assessment

## 2018-10-23 NOTE — ED Notes (Signed)
Gave patient the urinal 

## 2018-10-24 ENCOUNTER — Telehealth: Payer: Self-pay | Admitting: Internal Medicine

## 2018-10-24 ENCOUNTER — Inpatient Hospital Stay (HOSPITAL_COMMUNITY): Payer: Medicare Other

## 2018-10-24 DIAGNOSIS — R05 Cough: Secondary | ICD-10-CM

## 2018-10-24 DIAGNOSIS — J9600 Acute respiratory failure, unspecified whether with hypoxia or hypercapnia: Secondary | ICD-10-CM

## 2018-10-24 DIAGNOSIS — N179 Acute kidney failure, unspecified: Secondary | ICD-10-CM

## 2018-10-24 LAB — BASIC METABOLIC PANEL
ANION GAP: 10 (ref 5–15)
BUN: 20 mg/dL (ref 8–23)
CO2: 24 mmol/L (ref 22–32)
Calcium: 7.5 mg/dL — ABNORMAL LOW (ref 8.9–10.3)
Chloride: 102 mmol/L (ref 98–111)
Creatinine, Ser: 1.67 mg/dL — ABNORMAL HIGH (ref 0.61–1.24)
GFR calc Af Amer: 44 mL/min — ABNORMAL LOW (ref >60)
GFR calc non Af Amer: 38 mL/min — ABNORMAL LOW (ref >60)
Glucose, Bld: 115 mg/dL — ABNORMAL HIGH (ref 70–99)
Potassium: 4.5 mmol/L (ref 3.5–5.1)
Sodium: 136 mmol/L (ref 135–145)

## 2018-10-24 LAB — TROPONIN I: Troponin I: 0.03 ng/mL (ref <0.03)

## 2018-10-24 LAB — CBC
HCT: 33.3 % — ABNORMAL LOW (ref 39.0–52.0)
Hemoglobin: 10.5 g/dL — ABNORMAL LOW (ref 13.0–17.0)
MCH: 26.6 pg (ref 26.0–34.0)
MCHC: 31.5 g/dL (ref 30.0–36.0)
MCV: 84.5 fL (ref 80.0–100.0)
NRBC: 0 % (ref 0.0–0.2)
Platelets: 232 10*3/uL (ref 150–400)
RBC: 3.94 MIL/uL — AB (ref 4.22–5.81)
RDW: 15.3 % (ref 11.5–15.5)
WBC: 15.6 10*3/uL — ABNORMAL HIGH (ref 4.0–10.5)

## 2018-10-24 LAB — URINE CULTURE: Culture: NO GROWTH

## 2018-10-24 MED ORDER — BUDESONIDE 0.25 MG/2ML IN SUSP: 0.2500 mg | Freq: Four times a day (QID) | RESPIRATORY_TRACT | Status: DC

## 2018-10-24 MED ORDER — ORAL CARE MOUTH RINSE
15.0000 mL | Freq: Two times a day (BID) | OROMUCOSAL | Status: DC
  Administered 2018-10-24 – 2018-10-28 (×5): 15 mL via OROMUCOSAL

## 2018-10-24 MED ORDER — LACTATED RINGERS IV BOLUS
1000.0000 mL | Freq: Once | INTRAVENOUS | Status: AC
  Administered 2018-10-24: 1000 mL via INTRAVENOUS

## 2018-10-24 MED ORDER — CHLORHEXIDINE GLUCONATE 0.12 % MT SOLN
15.0000 mL | Freq: Two times a day (BID) | OROMUCOSAL | Status: DC
  Administered 2018-10-24 – 2018-10-29 (×10): 15 mL via OROMUCOSAL
  Filled 2018-10-24 (×10): qty 15

## 2018-10-24 MED ORDER — PHENYLEPHRINE HCL-NACL 10-0.9 MG/250ML-% IV SOLN
INTRAVENOUS | Status: AC
  Administered 2018-10-24: 10 mg
  Filled 2018-10-24: qty 250

## 2018-10-24 MED ORDER — ORAL CARE MOUTH RINSE
15.0000 mL | OROMUCOSAL | Status: DC
  Administered 2018-10-24: 15 mL via OROMUCOSAL

## 2018-10-24 MED ORDER — GUAIFENESIN-DM 100-10 MG/5ML PO SYRP
5.0000 mL | ORAL_SOLUTION | ORAL | Status: DC | PRN
  Filled 2018-10-24: qty 5

## 2018-10-24 MED ORDER — DEXTROSE IN LACTATED RINGERS 5 % IV SOLN
INTRAVENOUS | Status: DC
  Administered 2018-10-24 – 2018-10-27 (×4): via INTRAVENOUS

## 2018-10-24 MED ORDER — BUDESONIDE 0.25 MG/2ML IN SUSP
0.2500 mg | Freq: Four times a day (QID) | RESPIRATORY_TRACT | Status: DC
  Administered 2018-10-24 – 2018-10-25 (×6): 0.25 mg via RESPIRATORY_TRACT
  Filled 2018-10-24 (×6): qty 2

## 2018-10-24 MED ORDER — CHLORHEXIDINE GLUCONATE 0.12% ORAL RINSE (MEDLINE KIT)
15.0000 mL | Freq: Two times a day (BID) | OROMUCOSAL | Status: DC
  Administered 2018-10-24: 15 mL via OROMUCOSAL

## 2018-10-24 MED ORDER — IPRATROPIUM-ALBUTEROL 0.5-2.5 (3) MG/3ML IN SOLN
3.0000 mL | Freq: Four times a day (QID) | RESPIRATORY_TRACT | Status: DC
  Administered 2018-10-24 – 2018-10-25 (×7): 3 mL via RESPIRATORY_TRACT
  Filled 2018-10-24 (×6): qty 3

## 2018-10-24 NOTE — Telephone Encounter (Signed)
Ilean Chinamily  Saw this patient as inpatient 10/24/2018 . He has chronic cough. Please give him new appt in 2-4 weeks with me/app for post op and cough eval  Thanks    SIGNATURE    Dr. Kalman ShanMurali Jerilynn Feldmeier, M.D., F.C.C.P,  Pulmonary and Critical Care Medicine Staff Physician, [facility] Center Director - Interstitial Lung Disease  Program  Pulmonary Fibrosis [facility] [place], [place], [address]  Pager: [phone], If no answer or between  15:00h - 7:00h: call [phone] Telephone: [phone]  10:38 AM 10/24/2018

## 2018-10-24 NOTE — Anesthesia Postprocedure Evaluation (Signed)
Anesthesia Post Note  Patient: [name]  Procedure(s) Performed: EXPLORATORY LAPAROTOMY WITH GRAHAM PATCH FOR PERFORATED ULCER AND PLACEMENT OF GASTRIC TUBE (N/A )     Patient location during evaluation: SICU Anesthesia Type: General Level of consciousness: sedated Pain management: pain level controlled Vital Signs Assessment: post-procedure vital signs reviewed and stable Respiratory status: patient remains intubated per anesthesia plan Cardiovascular status: stable Postop Assessment: no apparent nausea or vomiting Anesthetic complications: no    Last Vitals:  Vitals:   10/23/18 2321 10/24/18 0000  BP:  (!) 87/43  Pulse: 66 (!) 55  Resp: 15 15  Temp:    SpO2: 97% 100%    Last Pain:  Vitals:   10/23/18 2300  TempSrc: Oral  PainSc:                  Shelton SilvasKevin D Tianna Baus

## 2018-10-24 NOTE — Progress Notes (Signed)
1 Day Post-Op   Subjective/Chief Complaint: On vent but awake    Objective: Vital signs in last 24 hours: Temp:  [98.1 F (36.7 C)-99.3 F (37.4 C)] 98.1 F (36.7 C) (12/29 0759) Pulse Rate:  [49-113] 53 (12/29 0800) Resp:  [13-28] 16 (12/29 0800) BP: (62-193)/(27-83) 117/44 (12/29 0800) SpO2:  [91 %-100 %] 100 % (12/29 0800) FiO2 (%):  [40 %] 40 % (12/29 0600) Weight:  [63.5 kg] 63.5 kg (12/28 1242)    Intake/Output from previous day: 12/28 0701 - 12/29 0700 In: 5567.9 [I.V.:3124.6; IV Piggyback:2443.3] Out: 1905 [Urine:1180; Emesis/NG output:225; Drains:400; Blood:100] Intake/Output this shift: No intake/output data recorded.  General appearance: alert Incision/Wound:dressing dry intact GT to gravity soft JP  Drain old bloody fluid   Lab Results:  Recent Labs    10/23/18 1344 10/24/18 0331  WBC 10.2 15.6*  HGB 13.9 10.5*  HCT 45.6 33.3*  PLT 231 232   BMET Recent Labs    10/23/18 1344 10/24/18 0331  NA 138 136  K 4.4 4.5  CL 99 102  CO2 29 24  GLUCOSE 103* 115*  BUN 18 20  CREATININE 1.17 1.67*  CALCIUM 9.3 7.5*   PT/INR No results for input(s): LABPROT, INR in the last 72 hours. ABG Recent Labs    10/23/18 2006  PHART 7.405  HCO3 28.8*    Studies/Results: Ct Abdomen Pelvis W Contrast  Result Date: 10/23/2018 CLINICAL DATA:  83 year old with acute onset of generalized abdominal pain, nausea and vomiting, and abdominal distention. Surgical history includes appendectomy EXAM: CT ABDOMEN AND PELVIS WITH CONTRAST TECHNIQUE: Multidetector CT imaging of the abdomen and pelvis was performed using the standard protocol following bolus administration of intravenous contrast. CONTRAST:  100mL OMNIPAQUE IOHEXOL 300 MG/ML IV. COMPARISON:  None. FINDINGS: Lower chest: Upper normal heart size. Apparent prior RIGHT coronary artery stenting. No pericardial effusion. 11 mm mean diameter nodule deep in the RIGHT LOWER LOBE adjacent to the RIGHT hemidiaphragm  (series 4, image 5). Minimal dependent atelectasis posteriorly in the lower lobes. Hepatobiliary: Liver normal in size and appearance. Gallbladder normal in appearance without calcified gallstones. No biliary ductal dilation. Pancreas: Normal in appearance without evidence of mass, ductal dilation, or inflammation. Spleen: Normal in size and appearance. Adrenals/Urinary Tract: Normal appearing adrenal glands. Kidneys normal in size and appearance without focal parenchymal abnormality. No hydronephrosis. No evidence of urinary tract calculi. Markedly distended urinary bladder with a large wide-mouth diverticulum arising from the RIGHT superolateral wall at the bladder dome and a small diverticulum arising from the RIGHT posterolateral wall near the base of the bladder. Stomach/Bowel: Marked thickening of the wall of the gastric antrum and pylorus. Stomach otherwise normal in appearance. Small amount of fluid in the distal esophagus without evidence of hiatal hernia. Small diverticulum arising medially from the junction of the second and third portion of the duodenum. Small bowel otherwise normal in appearance. Moderate stool burden in the ascending colon. Scattered sigmoid colon diverticula without evidence of acute diverticulitis. No visible colonic masses. Appendix surgically absent, though what is either a small diverticulum or the appendiceal stump is identified at the cecal tip. Vascular/Lymphatic: Moderate to severe aortoiliofemoral atherosclerosis without evidence of aneurysm. Normal-appearing portal venous and systemic venous systems. No pathologic lymphadenopathy. Reproductive: Markedly enlarged prostate gland containing calcifications. Asymmetric enlargement of the LEFT seminal vesicle. Other: Small amount of free intraperitoneal air as there are numerous extraluminal gas bubbles in the upper abdomen, adjacent to the stomach and beneath the LEFT hemidiaphragm. Small amount of ascites  in the perihepatic  region and the RIGHT side of the pelvis. Soft tissue thickening/mass along the LEFT LATERAL peritoneum and possibly to a lesser degree the RIGHT LATERAL peritoneum. Musculoskeletal: Severe degenerative disc disease, spondylosis and facet degenerative changes throughout the lumbar spine. No acute findings. Dystrophic calcifications adjacent to the ischia bilaterally. IMPRESSION: 1. Pneumoperitoneum, likely related to # 2 below. 2. Marked thickening of the wall of the gastric antrum and pylorus, likely indicating gastritis/peptic ulcer disease. 3. Soft tissue thickening/mass along the LEFT LATERAL peritoneum and possibly to a lesser degree the RIGHT LATERAL peritoneum, likely indicating peritoneal carcinomatosis. 4. Marked distention of the urinary bladder with a large wide-mouth diverticulum arising from the RIGHT superolateral wall at the bladder dome and a small RIGHT posterolateral wall diverticulum near the bladder base. 5. Markedly enlarged prostate gland containing calcifications. Asymmetric enlargement of the LEFT seminal vesicle. 6. 11 mm mean diameter nodule deep in the RIGHT LOWER LOBE adjacent to the RIGHT hemidiaphragm. Dedicated CT chest is recommended in further evaluation when the patient has recovered from the current acute emergency. I telephoned these emergent/critical results to Dr. Patria Maneampos in the emergency department at the time of interpretation on 10/23/2018 at 4:20 p.m. Electronically Signed   By: Hulan Saashomas  Lawrence M.D.   On: 10/23/2018 16:30   Dg Chest Port 1 View  Result Date: 10/23/2018 CLINICAL DATA:  Endotracheal intubation. EXAM: PORTABLE CHEST 1 VIEW COMPARISON:  Radiographs of Mar 10, 2018. FINDINGS: The heart size and mediastinal contours are within normal limits. Endotracheal and nasogastric tubes are in grossly good position. No pneumothorax or pleural effusion is noted. Minimal bibasilar subsegmental atelectasis is noted. The visualized skeletal structures are unremarkable.  IMPRESSION: Endotracheal and nasogastric tubes are in grossly good position. Minimal bibasilar subsegmental atelectasis. Aortic Atherosclerosis (ICD10-I70.0). Electronically Signed   By: Lupita RaiderJames  Green Jr, M.D.   On: 10/23/2018 21:00    Anti-infectives: Anti-infectives (From admission, onward)   Start     Dose/Rate Route Frequency Ordered Stop   10/24/18 0100  piperacillin-tazobactam (ZOSYN) IVPB 3.375 g     3.375 g 12.5 mL/hr over 240 Minutes Intravenous Every 8 hours 10/23/18 1951     10/23/18 1645  piperacillin-tazobactam (ZOSYN) IVPB 3.375 g     3.375 g 100 mL/hr over 30 Minutes Intravenous  Once 10/23/18 1635 10/23/18 1711      Assessment/Plan: s/p Procedure(s): EXPLORATORY LAPAROTOMY WITH GRAHAM PATCH FOR PERFORATED ULCER AND PLACEMENT OF GASTRIC TUBE (N/A) CCM following for vent management  D/C NGT Start dressing change Monday OOB once extubated   LOS: 1 day    Maisie Fushomas A [place] Cookson 10/24/2018

## 2018-10-24 NOTE — Consult Note (Signed)
NAME:  [name], MRN:  [id number], DOB:  January 26, 1935, LOS: 1 ADMISSION DATE:  10/23/2018, CONSULTATION DATE: October 23, 2018 REFERRING MD: Surgical services, CHIEF COMPLAINT: Knee pain  Brief History   Patient is 83 year old white male History of DVT history of cardiac stent 15 years ago from the notes he has been taking meloxicam he had a refill recently today he showed up with abdominal pain CT scan showed pneumoperitoneum he was taken to the OR Cheree Ditto patch was done.  He is on Zosyn. By the time I saw the patient patient was very agitated on the vent trying to pull the ET tube out I added fentanyl and propofol drips.  He also had gastrostomy tube placed   has a past medical history of Arthritis, C. difficile diarrhea, CAD (coronary artery disease) (2005), Carotid artery stenosis, Cognitive impairment, mild, so stated, Colitis, DVT (deep venous thrombosis) (HCC), Erectile dysfunction, GERD (gastroesophageal reflux disease), Hyperlipidemia, Hypertension, Peripheral vascular disease (HCC), Vertebrobasilar artery syndrome, and Vertigo.   has a past surgical history that includes pr vein bypass graft,aorto-fem-pop (2007); Appendectomy; and Colonoscopy.   EVENTS   10/23/2018 - s/p Procedure(s): EXPLORATORY LAPAROTOMY WITH GRAHAM PATCH FOR PERFORATED ULCER AND PLACEMENT OF GASTRIC TUBE (N/A). CCM following for vent management     SUBJECTIVE/OVERNIGHT/INTERVAL HX    12/29 - meets extubation criteria. Wife and daughter very concerned about chronic cough with wheezing x 1 year. Not compliant with mdi. Pulm consult pending and they are frustrated they have not heard from our office. No relief with esophageal stretch or nasal polyp surgery. Ct chest 2006 - clear but CT abd lung cut 10/23/2018 with basal infiltrate   Objective   Blood pressure (!) 83/33, pulse (!) 59, temperature 98.1 F (36.7 C), temperature source Oral, resp. rate 15, height 5\' 6"  (1.676 m), weight 63.5 kg, SpO2 99 %.      Vent Mode: PSV;CPAP FiO2 (%):  [40 %] 40 % Set Rate:  [15 bmp] 15 bmp Vt Set:  [500 mL] 500 mL PEEP:  [5 cmH20] 5 cmH20 Pressure Support:  [5 cmH20] 5 cmH20 Plateau Pressure:  [10 cmH20-16 cmH20] 16 cmH20   Intake/Output Summary (Last 24 hours) at 10/24/2018 1017 Last data filed at 10/24/2018 1000 Gross per 24 hour  Intake 6660.39 ml  Output 2140 ml  Net 4520.39 ml   Filed Weights   10/23/18 1242  Weight: 63.5 kg   General Appearance:  Looks well with ET tube on Head:  Normocephalic, without obvious abnormality, atraumatic Eyes:  PERRL - yes, conjunctiva/corneas - clear     Ears:  Normal external ear canals, both ears Nose:  G tube - no Throat:  ETT TUBE - yes , OG tube - yes Neck:  Supple,  No enlargement/tenderness/nodules Lungs: Clear to auscultation bilaterally but at lung base has crackles Heart:  S1 and S2 normal, no murmur, CVP - no.  Pressors - no Abdomen:  Soft, no masses, no organomegaly Genitalia / Rectal:  Not done Extremities:  Extremities- intact Skin:  ntact in exposed areas . Sacral area - not examined Neurologic:  Sedation - none -> RASS - +1 . Moves all 4s - yes. CAM-ICU - neg . Orientation - follows commands      LABS    PULMONARY Recent Labs  Lab 10/23/18 2006  PHART 7.405  PCO2ART 45.9  PO2ART 151.0*  HCO3 28.8*  TCO2 30  O2SAT 99.0    CBC Recent Labs  Lab 10/23/18 1344 10/24/18 0331  HGB 13.9  10.5*  HCT 45.6 33.3*  WBC 10.2 15.6*  PLT 231 232    COAGULATION No results for input(s): INR in the last 168 hours.  CARDIAC  No results for input(s): TROPONINI in the last 168 hours. No results for input(s): PROBNP in the last 168 hours.   CHEMISTRY Recent Labs  Lab 10/23/18 1344 10/24/18 0331  NA 138 136  K 4.4 4.5  CL 99 102  CO2 29 24  GLUCOSE 103* 115*  BUN 18 20  CREATININE 1.17 1.67*  CALCIUM 9.3 7.5*   Estimated Creatinine Clearance: 31.2 mL/min (A) (by C-G formula based on SCr of 1.67 mg/dL  (H)).   LIVER Recent Labs  Lab 10/23/18 1344  AST 23  ALT 18  ALKPHOS 83  BILITOT 0.6  PROT 7.1  ALBUMIN 3.5     INFECTIOUS No results for input(s): LATICACIDVEN, PROCALCITON in the last 168 hours.   ENDOCRINE CBG (last 3)  No results for input(s): GLUCAP in the last 72 hours.       IMAGING x48h  - image(s) personally visualized  -   highlighted in bold Ct Abdomen Pelvis W Contrast  Result Date: 10/23/2018 CLINICAL DATA:  83 year old with acute onset of generalized abdominal pain, nausea and vomiting, and abdominal distention. Surgical history includes appendectomy EXAM: CT ABDOMEN AND PELVIS WITH CONTRAST TECHNIQUE: Multidetector CT imaging of the abdomen and pelvis was performed using the standard protocol following bolus administration of intravenous contrast. CONTRAST:  OMNIPAQUE IOHEXOL 300 MG/ML IV. COMPARISON:  None. FINDINGS: Lower chest: Upper normal heart size. Apparent prior RIGHT coronary artery stenting. No pericardial effusion. 11 mm mean diameter nodule deep in the RIGHT LOWER LOBE adjacent to the RIGHT hemidiaphragm (series 4, image 5). Minimal dependent atelectasis posteriorly in the lower lobes. Hepatobiliary: Liver normal in size and appearance. Gallbladder normal in appearance without calcified gallstones. No biliary ductal dilation. Pancreas: Normal in appearance without evidence of mass, ductal dilation, or inflammation. Spleen: Normal in size and appearance. Adrenals/Urinary Tract: Normal appearing adrenal glands. Kidneys normal in size and appearance without focal parenchymal abnormality. No hydronephrosis. No evidence of urinary tract calculi. Markedly distended urinary bladder with a large wide-mouth diverticulum arising from the RIGHT superolateral wall at the bladder dome and a small diverticulum arising from the RIGHT posterolateral wall near the base of the bladder. Stomach/Bowel: Marked thickening of the wall of the gastric antrum and pylorus.  Stomach otherwise normal in appearance. Small amount of fluid in the distal esophagus without evidence of hiatal hernia. Small diverticulum arising medially from the junction of the second and third portion of the duodenum. Small bowel otherwise normal in appearance. Moderate stool burden in the ascending colon. Scattered sigmoid colon diverticula without evidence of acute diverticulitis. No visible colonic masses. Appendix surgically absent, though what is either a small diverticulum or the appendiceal stump is identified at the cecal tip. Vascular/Lymphatic: Moderate to severe aortoiliofemoral atherosclerosis without evidence of aneurysm. Normal-appearing portal venous and systemic venous systems. No pathologic lymphadenopathy. Reproductive: Markedly enlarged prostate gland containing calcifications. Asymmetric enlargement of the LEFT seminal vesicle. Other: Small amount of free intraperitoneal air as there are numerous extraluminal gas bubbles in the upper abdomen, adjacent to the stomach and beneath the LEFT hemidiaphragm. Small amount of ascites in the perihepatic region and the RIGHT side of the pelvis. Soft tissue thickening/mass along the LEFT LATERAL peritoneum and possibly to a lesser degree the RIGHT LATERAL peritoneum. Musculoskeletal: Severe degenerative disc disease, spondylosis and facet degenerative changes throughout the lumbar  spine. No acute findings. Dystrophic calcifications adjacent to the ischia bilaterally. IMPRESSION: 1. Pneumoperitoneum, likely related to # 2 below. 2. Marked thickening of the wall of the gastric antrum and pylorus, likely indicating gastritis/peptic ulcer disease. 3. Soft tissue thickening/mass along the LEFT LATERAL peritoneum and possibly to a lesser degree the RIGHT LATERAL peritoneum, likely indicating peritoneal carcinomatosis. 4. Marked distention of the urinary bladder with a large wide-mouth diverticulum arising from the RIGHT superolateral wall at the bladder dome  and a small RIGHT posterolateral wall diverticulum near the bladder base. 5. Markedly enlarged prostate gland containing calcifications. Asymmetric enlargement of the LEFT seminal vesicle. 6. 11 mm mean diameter nodule deep in the RIGHT LOWER LOBE adjacent to the RIGHT hemidiaphragm. Dedicated CT chest is recommended in further evaluation when the patient has recovered from the current acute emergency. I telephoned these emergent/critical results to Dr. Patria Maneampos in the emergency department at the time of interpretation on 10/23/2018 at 4:20 p.m. Electronically Signed   By: Hulan Saashomas  Lawrence M.D.   On: 10/23/2018 16:30   Dg Chest Port 1 View  Result Date: 10/24/2018 CLINICAL DATA:  83 year old male status post intubation. EXAM: PORTABLE CHEST 1 VIEW COMPARISON:  Chest x-ray 10/23/2018. FINDINGS: An endotracheal tube is in place with tip 3.0 cm above the carina. A nasogastric tube is seen extending into the stomach, however, the tip of the nasogastric tube extends below the lower margin of the image. Lung volumes are low. No consolidative airspace disease. No pleural effusions. Linear opacities throughout the mid to lower lungs bilaterally, similar to the prior study, which could reflect areas of subsegmental atelectasis or scarring. No evidence of pulmonary edema. Heart size is normal. Upper mediastinal contours are within normal limits. IMPRESSION: 1. Support apparatus, as above. 2. Areas of subsegmental atelectasis or scarring in the lung bases bilaterally. 3. Aortic atherosclerosis. Electronically Signed   By: Trudie Reedaniel  Entrikin M.D.   On: 10/24/2018 08:38   Dg Chest Port 1 View  Result Date: 10/23/2018 CLINICAL DATA:  Endotracheal intubation. EXAM: PORTABLE CHEST 1 VIEW COMPARISON:  Radiographs of Mar 10, 2018. FINDINGS: The heart size and mediastinal contours are within normal limits. Endotracheal and nasogastric tubes are in grossly good position. No pneumothorax or pleural effusion is noted. Minimal  bibasilar subsegmental atelectasis is noted. The visualized skeletal structures are unremarkable. IMPRESSION: Endotracheal and nasogastric tubes are in grossly good position. Minimal bibasilar subsegmental atelectasis. Aortic Atherosclerosis (ICD10-I70.0). Electronically Signed   By: Lupita RaiderJames  Green Jr, M.D.   On: 10/23/2018 21:00     Assessment & Plan:   Acute Post Op resp failure   - meets extubation criteria and extubated   Chronic Cough with wheeze x  1 year - new complaint 10/24/2018 -   ? Asthma. Home meds including BD nebs, tramadol, neurontin, flonase but without relief. No relief with esophageal stretch  - CT chest 12/28 shows basal atlectasis v ILD - check blood IgE and allergy profile 10/24/2018 - opd pulm clinic followup - needs HRCT in 6-12 weeks (message sent)  AKI  - new 10/24/2018  - fluid bolus and recheck - check mag and phos    --Pneumoperitoneum due to perforated gastric ulcer status post [name] patch and gastrostomy tube   - per CCS  --History of coronary artery disease stable resume aspirin when cleared by surgery.  - check troponin  --History of DVT - per CCS   Wife, daughter and patient updated  CCM will sign off   ATTESTATION & SIGNATURE  Dr. Kalman ShanMurali Unnamed Zeien, M.D., [facility] CountyF.C.C.P Pulmonary and Critical Care Medicine Staff Physician [facility] Pager: [phone], If no answer or between  15:00h - 7:00h: call [phone]  10/24/2018 10:18 AM

## 2018-10-24 NOTE — Procedures (Signed)
Extubation Procedure Note  Patient Details:   Name: Robert SimplerJack Eblen DOB: Feb 06, 1935 MRN: [id number]   Airway Documentation:    Vent end date: 10/24/18 Vent end time: 1031   Evaluation  O2 sats: stable throughout Complications: No apparent complications Patient did tolerate procedure well. Bilateral Breath Sounds: Clear, Diminished   Yes   Patient extubated per order to 4L [place] with no apparent complications. Positive cuff leak was noted prior to extubation. Patient is alert and oriented and is able to speak. Vitals are stable. RT will continue to monitor.   Tea Collums Lajuana RippleM Jobeth Pangilinan 10/24/2018, 10:35 AM

## 2018-10-24 NOTE — Plan of Care (Signed)

## 2018-10-24 NOTE — Progress Notes (Signed)
Dr. Luisa Hartornett at bedside to assess pt. MD updated on pt condition overnight. MD stated pt dressing to remain in place, to be taken down tomorrow 10/25/18. MD also gave orders to discontinue OGT and for pt to get up to chair after extubation. Extubation to be determined by CCM.

## 2018-10-25 ENCOUNTER — Encounter (HOSPITAL_COMMUNITY): Payer: Self-pay | Admitting: General Surgery

## 2018-10-25 LAB — COMPREHENSIVE METABOLIC PANEL
ALBUMIN: 2.4 g/dL — AB (ref 3.5–5.0)
ALT: 24 U/L (ref 0–44)
ANION GAP: 11 (ref 5–15)
AST: 36 U/L (ref 15–41)
Alkaline Phosphatase: 45 U/L (ref 38–126)
BUN: 20 mg/dL (ref 8–23)
CO2: 24 mmol/L (ref 22–32)
Calcium: 7.4 mg/dL — ABNORMAL LOW (ref 8.9–10.3)
Chloride: 103 mmol/L (ref 98–111)
Creatinine, Ser: 1.32 mg/dL — ABNORMAL HIGH (ref 0.61–1.24)
GFR calc Af Amer: 58 mL/min — ABNORMAL LOW (ref >60)
GFR calc non Af Amer: 50 mL/min — ABNORMAL LOW (ref >60)
GLUCOSE: 101 mg/dL — AB (ref 70–99)
Potassium: 4 mmol/L (ref 3.5–5.1)
Sodium: 138 mmol/L (ref 135–145)
Total Bilirubin: 0.7 mg/dL (ref 0.3–1.2)
Total Protein: 5.6 g/dL — ABNORMAL LOW (ref 6.5–8.1)

## 2018-10-25 LAB — PHOSPHORUS: Phosphorus: 3.3 mg/dL (ref 2.5–4.6)

## 2018-10-25 LAB — CBC
HCT: 31.4 % — ABNORMAL LOW (ref 39.0–52.0)
Hemoglobin: 10.1 g/dL — ABNORMAL LOW (ref 13.0–17.0)
MCH: 27.7 pg (ref 26.0–34.0)
MCHC: 32.2 g/dL (ref 30.0–36.0)
MCV: 86 fL (ref 80.0–100.0)
Platelets: 173 10*3/uL (ref 150–400)
RBC: 3.65 MIL/uL — ABNORMAL LOW (ref 4.22–5.81)
RDW: 15.5 % (ref 11.5–15.5)
WBC: 12 10*3/uL — AB (ref 4.0–10.5)
nRBC: 0 % (ref 0.0–0.2)

## 2018-10-25 LAB — LACTIC ACID, PLASMA: Lactic Acid, Venous: 1.1 mmol/L (ref 0.5–1.9)

## 2018-10-25 LAB — MAGNESIUM: Magnesium: 1.7 mg/dL (ref 1.7–2.4)

## 2018-10-25 LAB — TROPONIN I: Troponin I: <0.03 ng/mL (ref <0.03)

## 2018-10-25 MED ORDER — IPRATROPIUM-ALBUTEROL 0.5-2.5 (3) MG/3ML IN SOLN
3.0000 mL | Freq: Two times a day (BID) | RESPIRATORY_TRACT | Status: DC
  Administered 2018-10-26 – 2018-10-29 (×7): 3 mL via RESPIRATORY_TRACT
  Filled 2018-10-25 (×7): qty 3

## 2018-10-25 MED ORDER — BUDESONIDE 0.25 MG/2ML IN SUSP
0.2500 mg | Freq: Two times a day (BID) | RESPIRATORY_TRACT | Status: DC
  Administered 2018-10-26 – 2018-10-29 (×7): 0.25 mg via RESPIRATORY_TRACT
  Filled 2018-10-25 (×7): qty 2

## 2018-10-25 MED ORDER — ACETAMINOPHEN 10 MG/ML IV SOLN
1000.0000 mg | Freq: Four times a day (QID) | INTRAVENOUS | Status: AC
  Administered 2018-10-25 – 2018-10-26 (×4): 1000 mg via INTRAVENOUS
  Filled 2018-10-25 (×4): qty 100

## 2018-10-25 MED ORDER — METHOCARBAMOL 1000 MG/10ML IJ SOLN
500.0000 mg | Freq: Four times a day (QID) | INTRAVENOUS | Status: DC | PRN
  Filled 2018-10-25: qty 5

## 2018-10-25 NOTE — Telephone Encounter (Signed)
Pt does have an appt scheduled with Buelah ManisBeth Walsh, NP 11/15/18.

## 2018-10-25 NOTE — Evaluation (Signed)
Physical Therapy Evaluation Patient Details Name: [name] Delucchi MRN: [id number] DOB: 09-Dec-1934 Today's Date: 10/25/2018   History of Present Illness  Patient is an 83 yo male who presents for Pneumoperitoneum due to perforated gastric ulcer status post [name] patch and gastrostomy   Clinical Impression  Orders received for PT evaluation. Patient demonstrates deficits in functional mobility as indicated below. Will benefit from continued skilled PT to address deficits and maximize function. Will see as indicated and progress as tolerated.  Anticipate patient will progress quickly, no post acute recommendations at this time. VSS on 2 liters 94% with ambulation, attempted room air and desaturation to 91% at rest.    Follow Up Recommendations No PT follow up;Supervision - Intermittent    Equipment Recommendations  None recommended by PT    Recommendations for Other Services       Precautions / Restrictions Precautions Precaution Comments: JP drain, foley, and foley drain Restrictions Weight Bearing Restrictions: No      Mobility  Bed Mobility               General bed mobility comments: received in chair  Transfers Overall transfer level: Needs assistance Equipment used: None Transfers: Sit to/from Stand Sit to Stand: Min guard         General transfer comment: min guard for safety  Ambulation/Gait Ambulation/Gait assistance: Min guard(one spisode of min assist) Gait Distance (Feet): 380 Feet Assistive device: None Gait Pattern/deviations: Drifts right/left Gait velocity: decreased Gait velocity interpretation: 1.31 - 2.62 ft/sec, indicative of limited community ambulator General Gait Details: modest instbaility able ot self correct, one episode of LOB requiring min assist   Stairs            Wheelchair Mobility    Modified Rankin (Stroke Patients Only)       Balance Overall balance assessment: Mild deficits observed, not formally  tested Sitting-balance support: Feet supported Sitting balance-Leahy Scale: Good     Standing balance support: No upper extremity supported Standing balance-Leahy Scale: Fair Standing balance comment: able to stand self supported without assist, mild instability and balance check initially                             Pertinent Vitals/Pain Pain Assessment: Faces Faces Pain Scale: Hurts little more Pain Location: abdomen Pain Descriptors / Indicators: Operative site guarding;Sore    Home Living Family/patient expects to be discharged to:: Private residence Living Arrangements: Spouse/significant other Available Help at Discharge: Family;Available 24 hours/day Type of Home: House Home Access: Stairs to enter Entrance Stairs-Rails: Can reach both Entrance Stairs-Number of Steps: 4 Home Layout: Two level;Bed/bath upstairs Home Equipment: None      Prior Function Level of Independence: Independent               Hand Dominance   Dominant Hand: Right    Extremity/Trunk Assessment   Upper Extremity Assessment Upper Extremity Assessment: Overall WFL for tasks assessed    Lower Extremity Assessment Lower Extremity Assessment: Overall WFL for tasks assessed       Communication   Communication: No difficulties  Cognition Arousal/Alertness: Awake/alert Behavior During Therapy: WFL for tasks assessed/performed Overall Cognitive Status: Within Functional Limits for tasks assessed                                        General Comments  Exercises     Assessment/Plan    PT Assessment Patient needs continued PT services  PT Problem List Decreased activity tolerance;Decreased balance;Decreased mobility;Cardiopulmonary status limiting activity       PT Treatment Interventions DME instruction;Gait training;Stair training;Functional mobility training;Therapeutic activities;Therapeutic exercise;Balance training;Patient/family education     PT Goals (Current goals can be found in the Care Plan section)  Acute Rehab PT Goals Patient Stated Goal: to go home PT Goal Formulation: With patient Time For Goal Achievement: 11/08/18 Potential to Achieve Goals: Good    Frequency Min 3X/week   Barriers to discharge        Co-evaluation PT/OT/SLP Co-Evaluation/Treatment: Yes Reason for Co-Treatment: To address functional/ADL transfers;For patient/therapist safety PT goals addressed during session: Mobility/safety with mobility;Balance OT goals addressed during session: ADL's and self-care       AM-PAC PT "6 Clicks" Mobility  Outcome Measure Help needed turning from your back to your side while in a flat bed without using bedrails?: None Help needed moving from lying on your back to sitting on the side of a flat bed without using bedrails?: None Help needed moving to and from a bed to a chair (including a wheelchair)?: A Little Help needed standing up from a chair using your arms (e.g., wheelchair or bedside chair)?: A Little Help needed to walk in hospital room?: A Little Help needed climbing 3-5 steps with a railing? : A Little 6 Click Score: 20    End of Session Equipment Utilized During Treatment: Oxygen Activity Tolerance: Patient tolerated treatment well Patient left: in chair;with call bell/phone within reach Nurse Communication: Mobility status PT Visit Diagnosis: Unsteadiness on feet (R26.81)    Time: [id number] PT Time Calculation (min) (ACUTE ONLY): 21 min   Charges:   PT Evaluation $PT Eval Moderate Complexity: 1 Mod          Charlotte Crumbevon Malakhai Beitler, PT DPT  Board Certified Neurologic Specialist Acute Rehabilitation Services Pager [phone] Office [phone]   Fabio AsaDevon J Mailin Coglianese 10/25/2018, 9:10 AM

## 2018-10-25 NOTE — Evaluation (Signed)
Occupational Therapy Evaluation Patient Details Name: [name] MRN: [id number] DOB: 06/17/1935 Today's Date: 10/25/2018    History of Present Illness Patient is an 83 yo male who presents for Pneumoperitoneum due to perforated gastric ulcer status post [name] patch and gastrostomy    Clinical Impression   Pt is typically independent. Presents with generalized weakness, mild abdominal pain and gait instability. He requires set up to min assist for ADL. Pt likely to progress well. Currently reliant on 2L 02 to maintain sats >91%. Will follow acutely.    Follow Up Recommendations  No OT follow up    Equipment Recommendations  None recommended by OT    Recommendations for Other Services       Precautions / Restrictions Precautions Precautions: Fall Precaution Comments: JP drain, foley, and foley drain Restrictions Weight Bearing Restrictions: No      Mobility Bed Mobility               General bed mobility comments: received in chair  Transfers Overall transfer level: Needs assistance Equipment used: (pushed IV pole) Transfers: Sit to/from Stand Sit to Stand: Min guard         General transfer comment: min guard for safety    Balance Overall balance assessment: Mild deficits observed, not formally tested Sitting-balance support: Feet supported Sitting balance-Leahy Scale: Good     Standing balance support: No upper extremity supported Standing balance-Leahy Scale: Fair Standing balance comment: able to stand self supported without assist, mild instability and balance check initially                           ADL either performed or assessed with clinical judgement   ADL Overall ADL's : Needs assistance/impaired Eating/Feeding: NPO   Grooming: Min guard;Standing   Upper Body Bathing: Set up;Sitting   Lower Body Bathing: Min guard;Sit to/from stand   Upper Body Dressing : Set up;Sitting   Lower Body Dressing: Min guard;Sit to/from  stand   Toilet Transfer: Minimal assistance;Ambulation   Toileting- Clothing Manipulation and Hygiene: Min guard;Sit to/from stand       Functional mobility during ADLs: Minimal assistance(pushed IV pole)       Vision Baseline Vision/History: Wears glasses Wears Glasses: Reading only Patient Visual Report: No change from baseline       Perception     Praxis      Pertinent Vitals/Pain Pain Assessment: Faces Faces Pain Scale: Hurts little more Pain Location: abdomen Pain Descriptors / Indicators: Operative site guarding;Sore Pain Intervention(s): Monitored during session     Hand Dominance Right   Extremity/Trunk Assessment Upper Extremity Assessment Upper Extremity Assessment: Overall WFL for tasks assessed   Lower Extremity Assessment Lower Extremity Assessment: Overall WFL for tasks assessed       Communication Communication Communication: No difficulties   Cognition Arousal/Alertness: Awake/alert Behavior During Therapy: WFL for tasks assessed/performed Overall Cognitive Status: Within Functional Limits for tasks assessed                                     General Comments       Exercises     Shoulder Instructions      Home Living Family/patient expects to be discharged to:: Private residence Living Arrangements: Spouse/significant other Available Help at Discharge: Family;Available 24 hours/day Type of Home: House Home Access: Stairs to enter Entergy CorporationEntrance Stairs-Number of Steps: 4 Entrance Stairs-Rails: Can  reach both Home Layout: Two level;Bed/bath upstairs Alternate Level Stairs-Number of Steps: flight Alternate Level Stairs-Rails: Can reach both Bathroom Shower/Tub: Chief Strategy OfficerTub/shower unit   Bathroom Toilet: Standard     Home Equipment: None          Prior Functioning/Environment Level of Independence: Independent        Comments: drives, still works as a Architectconsultant        OT Problem List: Impaired balance (sitting  and/or standing);Decreased activity tolerance;Pain;Cardiopulmonary status limiting activity      OT Treatment/Interventions: Self-care/ADL training;DME and/or AE instruction;Balance training;Patient/family education;Therapeutic activities    OT Goals(Current goals can be found in the care plan section) Acute Rehab OT Goals Patient Stated Goal: to go home OT Goal Formulation: With patient Time For Goal Achievement: 11/01/18 Potential to Achieve Goals: Good ADL Goals Pt Will Perform Grooming: standing;Independently Pt Will Transfer to Toilet: ambulating;regular height toilet;Independently Pt Will Perform Tub/Shower Transfer: with modified independence;ambulating;Tub transfer Additional ADL Goal #1: Pt will gather items necessary for ADL around room independently.  OT Frequency: Min 2X/week   Barriers to D/C:            Co-evaluation   Reason for Co-Treatment: To address functional/ADL transfers;For patient/therapist safety PT goals addressed during session: Mobility/safety with mobility;Balance OT goals addressed during session: ADL's and self-care      AM-PAC OT "6 Clicks" Daily Activity     Outcome Measure Help from another person eating meals?: None Help from another person taking care of personal grooming?: A Little Help from another person toileting, which includes using toliet, bedpan, or urinal?: A Little Help from another person bathing (including washing, rinsing, drying)?: A Little Help from another person to put on and taking off regular upper body clothing?: None Help from another person to put on and taking off regular lower body clothing?: A Little 6 Click Score: 20   End of Session Nurse Communication: Mobility status  Activity Tolerance: Patient tolerated treatment well Patient left: in chair;with call bell/phone within reach  OT Visit Diagnosis: Unsteadiness on feet (R26.81);Pain                Time: [id number] OT Time Calculation (min): 23 min Charges:   OT General Charges $OT Visit: 1 Visit OT Evaluation $OT Eval Moderate Complexity: 1 Mod  Martie RoundJulie Navdeep Fessenden, OTR/L Acute Rehabilitation Services Pager: [phone] Office: [phone]  Evern BioMayberry, Sadiya Durand Lynn 10/25/2018, 9:29 AM

## 2018-10-25 NOTE — Progress Notes (Addendum)
[facility] Progress Note  2 Days Post-Op  Subjective: CC-  Patient extubated yesterday morning. Up in chair today. States that his abdomen is sore, but overall doing ok. Denies n/v. No flatus or BM. Denies CP or SOB.  Lives at home with wife.  PTA ambulates without assistive device.  Objective: Vital signs in last 24 hours: Temp:  [98.1 F (36.7 C)-99.7 F (37.6 C)] 99.2 F (37.3 C) (12/30 0330) Pulse Rate:  [39-97] 97 (12/30 0700) Resp:  [9-32] 16 (12/30 0700) BP: (83-141)/(33-77) 129/64 (12/30 0700) SpO2:  [90 %-100 %] 91 % (12/30 0700) FiO2 (%):  [36 %-40 %] 36 % (12/29 1120) Last BM Date: (unknown)  Intake/Output from previous day: 12/29 0701 - 12/30 0700 In: 2908.2 [I.V.:1658.7; IV Piggyback:1249.5] Out: 1845 [Urine:1235; Drains:610] Intake/Output this shift: No intake/output data recorded.  PE: Gen:  Alert, NAD, pleasant HEENT: EOM's intact, pupils equal and round Card:  RRR Pulm:  CTAB, no W/R/R, effort normal, pulling 1000 on IS Ext:  Calves soft and nontender, no BLE edema UJW:JXBJ, mild distension, hypoactive BS, JP drain output serosanguinous, G-tube to gravity, open midline wound pink      Lab Results:  Recent Labs    10/23/18 1344 10/24/18 0331  WBC 10.2 15.6*  HGB 13.9 10.5*  HCT 45.6 33.3*  PLT 231 232   BMET Recent Labs    10/24/18 0331 10/25/18 0338  NA 136 138  K 4.5 4.0  CL 102 103  CO2 24 24  GLUCOSE 115* 101*  BUN 20 20  CREATININE 1.67* 1.32*  CALCIUM 7.5* 7.4*   PT/INR No results for input(s): LABPROT, INR in the last 72 hours. CMP     Component Value Date/Time   NA 138 10/25/2018 0338   NA 142 04/16/2018 0948   K 4.0 10/25/2018 0338   CL 103 10/25/2018 0338   CO2 24 10/25/2018 0338   GLUCOSE 101 (H) 10/25/2018 0338   GLUCOSE 98 10/04/2010   BUN 20 10/25/2018 0338   BUN 19 04/16/2018 0948   CREATININE 1.32 (H) 10/25/2018 0338   CREATININE 0.92 03/31/2016 1051   CALCIUM 7.4 (L) 10/25/2018 0338   PROT 5.6 (L) 10/25/2018 0338   PROT 6.5 03/31/2017 0912   ALBUMIN 2.4 (L) 10/25/2018 0338   ALBUMIN 4.3 03/31/2017 0912   AST 36 10/25/2018 0338   ALT 24 10/25/2018 0338   ALKPHOS 45 10/25/2018 0338   BILITOT 0.7 10/25/2018 0338   BILITOT 0.4 03/31/2017 0912   GFRNONAA 50 (L) 10/25/2018 0338   GFRAA 58 (L) 10/25/2018 0338   Lipase     Component Value Date/Time   LIPASE 38 10/23/2018 1344       Studies/Results: Ct Abdomen Pelvis W Contrast  Result Date: 10/23/2018 CLINICAL DATA:  83 year old with acute onset of generalized abdominal pain, nausea and vomiting, and abdominal distention. Surgical history includes appendectomy EXAM: CT ABDOMEN AND PELVIS WITH CONTRAST TECHNIQUE: Multidetector CT imaging of the abdomen and pelvis was performed using the standard protocol following bolus administration of intravenous contrast. CONTRAST:  OMNIPAQUE IOHEXOL 300 MG/ML IV. COMPARISON:  None. FINDINGS: Lower chest: Upper normal heart size. Apparent prior RIGHT coronary artery stenting. No pericardial effusion. 11 mm mean diameter nodule deep in the RIGHT LOWER LOBE adjacent to the RIGHT hemidiaphragm (series 4, image 5). Minimal dependent atelectasis posteriorly in the lower lobes. Hepatobiliary: Liver normal in size and appearance. Gallbladder normal in appearance without calcified gallstones. No biliary ductal dilation. Pancreas: Normal in appearance without evidence of  mass, ductal dilation, or inflammation. Spleen: Normal in size and appearance. Adrenals/Urinary Tract: Normal appearing adrenal glands. Kidneys normal in size and appearance without focal parenchymal abnormality. No hydronephrosis. No evidence of urinary tract calculi. Markedly distended urinary bladder with a large wide-mouth diverticulum arising from the RIGHT superolateral wall at the bladder dome and a small diverticulum arising from the RIGHT posterolateral wall near the base of the bladder. Stomach/Bowel: Marked thickening  of the wall of the gastric antrum and pylorus. Stomach otherwise normal in appearance. Small amount of fluid in the distal esophagus without evidence of hiatal hernia. Small diverticulum arising medially from the junction of the second and third portion of the duodenum. Small bowel otherwise normal in appearance. Moderate stool burden in the ascending colon. Scattered sigmoid colon diverticula without evidence of acute diverticulitis. No visible colonic masses. Appendix surgically absent, though what is either a small diverticulum or the appendiceal stump is identified at the cecal tip. Vascular/Lymphatic: Moderate to severe aortoiliofemoral atherosclerosis without evidence of aneurysm. Normal-appearing portal venous and systemic venous systems. No pathologic lymphadenopathy. Reproductive: Markedly enlarged prostate gland containing calcifications. Asymmetric enlargement of the LEFT seminal vesicle. Other: Small amount of free intraperitoneal air as there are numerous extraluminal gas bubbles in the upper abdomen, adjacent to the stomach and beneath the LEFT hemidiaphragm. Small amount of ascites in the perihepatic region and the RIGHT side of the pelvis. Soft tissue thickening/mass along the LEFT LATERAL peritoneum and possibly to a lesser degree the RIGHT LATERAL peritoneum. Musculoskeletal: Severe degenerative disc disease, spondylosis and facet degenerative changes throughout the lumbar spine. No acute findings. Dystrophic calcifications adjacent to the ischia bilaterally. IMPRESSION: 1. Pneumoperitoneum, likely related to # 2 below. 2. Marked thickening of the wall of the gastric antrum and pylorus, likely indicating gastritis/peptic ulcer disease. 3. Soft tissue thickening/mass along the LEFT LATERAL peritoneum and possibly to a lesser degree the RIGHT LATERAL peritoneum, likely indicating peritoneal carcinomatosis. 4. Marked distention of the urinary bladder with a large wide-mouth diverticulum arising from  the RIGHT superolateral wall at the bladder dome and a small RIGHT posterolateral wall diverticulum near the bladder base. 5. Markedly enlarged prostate gland containing calcifications. Asymmetric enlargement of the LEFT seminal vesicle. 6. 11 mm mean diameter nodule deep in the RIGHT LOWER LOBE adjacent to the RIGHT hemidiaphragm. Dedicated CT chest is recommended in further evaluation when the patient has recovered from the current acute emergency. I telephoned these emergent/critical results to Dr. Patria Maneampos in the emergency department at the time of interpretation on 10/23/2018 at 4:20 p.m. Electronically Signed   By: Hulan Saashomas  Lawrence M.D.   On: 10/23/2018 16:30   Dg Chest Port 1 View  Result Date: 10/24/2018 CLINICAL DATA:  83 year old male status post intubation. EXAM: PORTABLE CHEST 1 VIEW COMPARISON:  Chest x-ray 10/23/2018. FINDINGS: An endotracheal tube is in place with tip 3.0 cm above the carina. A nasogastric tube is seen extending into the stomach, however, the tip of the nasogastric tube extends below the lower margin of the image. Lung volumes are low. No consolidative airspace disease. No pleural effusions. Linear opacities throughout the mid to lower lungs bilaterally, similar to the prior study, which could reflect areas of subsegmental atelectasis or scarring. No evidence of pulmonary edema. Heart size is normal. Upper mediastinal contours are within normal limits. IMPRESSION: 1. Support apparatus, as above. 2. Areas of subsegmental atelectasis or scarring in the lung bases bilaterally. 3. Aortic atherosclerosis. Electronically Signed   By: Trudie Reedaniel  Entrikin M.D.   On: 10/24/2018  08:38   Dg Chest Port 1 View  Result Date: 10/23/2018 CLINICAL DATA:  Endotracheal intubation. EXAM: PORTABLE CHEST 1 VIEW COMPARISON:  Radiographs of Mar 10, 2018. FINDINGS: The heart size and mediastinal contours are within normal limits. Endotracheal and nasogastric tubes are in grossly good position. No  pneumothorax or pleural effusion is noted. Minimal bibasilar subsegmental atelectasis is noted. The visualized skeletal structures are unremarkable. IMPRESSION: Endotracheal and nasogastric tubes are in grossly good position. Minimal bibasilar subsegmental atelectasis. Aortic Atherosclerosis (ICD10-I70.0). Electronically Signed   By: Lupita RaiderJames  Green Jr, M.D.   On: 10/23/2018 21:00    Anti-infectives: Anti-infectives (From admission, onward)   Start     Dose/Rate Route Frequency Ordered Stop   10/24/18 0100  piperacillin-tazobactam (ZOSYN) IVPB 3.375 g     3.375 g 12.5 mL/hr over 240 Minutes Intravenous Every 8 hours 10/23/18 1951     10/23/18 1645  piperacillin-tazobactam (ZOSYN) IVPB 3.375 g     3.375 g 100 mL/hr over 30 Minutes Intravenous  Once 10/23/18 1635 10/23/18 1711       Assessment/Plan H/o DVT H/o CAD - hold aspirin 81mg  Asthma HTN HLD GERD AKI - Cr 1.32 from 1.67, improving, continue IVF and repeat BMP  Perforated pyloric channel ulcer S/p Exploratory laparotomy with [name] patch repair of perforated ulcer with omentum, Stamm gastrostomy tube placement 12/28 Dr. Dwain SarnaWakefield - POD 2 - BID wet to dry dressing changes to midline wound - plan for UGI prior to diet advancement - continue IV zosyn and PPI - needs 5 total days abx - JP drainput output SS  ID - zosyn 12/28>> FEN - IVF, NPO, G-tube to gravity VTE - SCDs, lovenox Foley - d/c 12/30 Follow up - Dr. Dwain SarnaWakefield  Plan - CBC pending. Transfer out of ICU. Wean to room air. D/c foley. Keep NPO/G-tube to gravity and await return in bowel function. Consult PT/OT. IV tylenol and IV robaxin for better pain control and to limit narcotic use.   LOS: 2 days    Franne FortsBrooke A Meuth , [facility] 10/25/2018, 7:39 AM Pager: [phone] Mon 7:00 am -11:30 AM Tues-Fri 7:00 am-4:30 pm Sat-Sun 7:00 am-11:30 am

## 2018-10-25 NOTE — Progress Notes (Signed)
SLP Cancellation Note  Patient Details Name: [name] MRN: [id number] DOB: 1934/12/19   Cancelled treatment:       Reason Eval/Treat Not Completed: Medical issues which prohibited therapy. CCM MD ordered swallow eval after extubation yesterday, but per surgery note pt is NPO for possible UGI tomorrow, may have ulcer. Discussed with RN and called PA, waiting to hear back. Will defer swallow eval for now.   Harlon DittyBonnie Richardo Popoff, MA CCC-SLP  Acute Rehabilitation Services Pager [phone] Office [phone]  Claudine MoutonDeBlois, Shi Grose Caroline 10/25/2018, 1:54 PM

## 2018-10-26 ENCOUNTER — Inpatient Hospital Stay (HOSPITAL_COMMUNITY): Payer: Medicare Other

## 2018-10-26 ENCOUNTER — Encounter (HOSPITAL_COMMUNITY): Payer: Self-pay | Admitting: General Practice

## 2018-10-26 LAB — ALLERGENS W/TOTAL IGE AREA 2
Alternaria Alternata IgE: <0.1 kU/L
Aspergillus Fumigatus IgE: <0.1 kU/L
Bermuda Grass IgE: <0.1 kU/L
Cat Dander IgE: <0.1 kU/L
Cedar, Mountain IgE: <0.1 kU/L
Cladosporium Herbarum IgE: <0.1 kU/L
Cockroach, German IgE: <0.1 kU/L
Common Silver Birch IgE: <0.1 kU/L
Cottonwood IgE: <0.1 kU/L
D Pteronyssinus IgE: <0.1 kU/L
Dog Dander IgE: <0.1 kU/L
Elm, American IgE: <0.1 kU/L
IgE (Immunoglobulin E), Serum: 15 IU/mL (ref 6–495)
Johnson Grass IgE: <0.1 kU/L
Maple/Box Elder IgE: <0.1 kU/L
Mouse Urine IgE: <0.1 kU/L
Oak, White IgE: <0.1 kU/L
Penicillium Chrysogen IgE: <0.1 kU/L
Pigweed, Rough IgE: <0.1 kU/L
Ragweed, Short IgE: <0.1 kU/L
Sheep Sorrel IgE Qn: <0.1 kU/L
Timothy Grass IgE: <0.1 kU/L
White Mulberry IgE: <0.1 kU/L

## 2018-10-26 LAB — BASIC METABOLIC PANEL
Anion gap: 9 (ref 5–15)
BUN: 12 mg/dL (ref 8–23)
CHLORIDE: 106 mmol/L (ref 98–111)
CO2: 25 mmol/L (ref 22–32)
Calcium: 7.9 mg/dL — ABNORMAL LOW (ref 8.9–10.3)
Creatinine, Ser: 1.18 mg/dL (ref 0.61–1.24)
GFR calc Af Amer: >60 mL/min (ref >60)
GFR calc non Af Amer: 58 mL/min — ABNORMAL LOW (ref >60)
Glucose, Bld: 85 mg/dL (ref 70–99)
POTASSIUM: 3.5 mmol/L (ref 3.5–5.1)
Sodium: 140 mmol/L (ref 135–145)

## 2018-10-26 LAB — CBC
HCT: 30.1 % — ABNORMAL LOW (ref 39.0–52.0)
HEMOGLOBIN: 9.5 g/dL — AB (ref 13.0–17.0)
MCH: 27.5 pg (ref 26.0–34.0)
MCHC: 31.6 g/dL (ref 30.0–36.0)
MCV: 87.2 fL (ref 80.0–100.0)
Platelets: 176 10*3/uL (ref 150–400)
RBC: 3.45 MIL/uL — ABNORMAL LOW (ref 4.22–5.81)
RDW: 15.3 % (ref 11.5–15.5)
WBC: 11.8 10*3/uL — ABNORMAL HIGH (ref 4.0–10.5)
nRBC: 0 % (ref 0.0–0.2)

## 2018-10-26 LAB — MAGNESIUM: Magnesium: 2 mg/dL (ref 1.7–2.4)

## 2018-10-26 LAB — PHOSPHORUS: Phosphorus: 2.8 mg/dL (ref 2.5–4.6)

## 2018-10-26 MED ORDER — IOHEXOL 300 MG/ML  SOLN
150.0000 mL | Freq: Once | INTRAMUSCULAR | Status: AC | PRN
  Administered 2018-10-26: 150 mL via ORAL

## 2018-10-26 MED ORDER — HYDROMORPHONE HCL 1 MG/ML IJ SOLN
1.0000 mg | INTRAMUSCULAR | Status: DC | PRN
  Administered 2018-10-26 – 2018-10-27 (×3): 1 mg via INTRAVENOUS
  Filled 2018-10-26 (×3): qty 1

## 2018-10-26 MED ORDER — IOHEXOL 300 MG/ML  SOLN
150.0000 mL | Freq: Once | INTRAMUSCULAR | Status: AC | PRN
  Administered 2018-10-26: 55 mL via ORAL

## 2018-10-26 NOTE — Progress Notes (Signed)
Patient ID: Robert SimplerJack [name], male   DOB: [birth year], 83 y.o.   MRN: [id number]    3 Days Post-Op  Subjective: Patient feels well.  No new complaints.  Some soreness, but controlled.  No nausea.  No flatus.  Objective: Vital signs in last 24 hours: Temp:  [98.5 F (36.9 C)-99.1 F (37.3 C)] 98.6 F (37 C) (12/31 0615) Pulse Rate:  [68-96] 79 (12/31 0615) Resp:  [12-23] 16 (12/30 2002) BP: (105-146)/(45-81) 136/64 (12/31 0615) SpO2:  [94 %-100 %] 98 % (12/31 0615) Last BM Date: (unknown)  Intake/Output from previous day: 12/30 0701 - 12/31 0700 In: 273.8 [I.V.:186; IV Piggyback:12.8] Out: 865 [Urine:635; Drains:230] Intake/Output this shift: No intake/output data recorded.  PE: Heart: regular Lungs: CTAB Abd: soft, +BS, g-tube with minimal bilious output, 150cc/24hrs.  Drain with minimal serosang output.  Midline wound is clean and packed  Lab Results:  Recent Labs    10/25/18 0709 10/26/18 0606  WBC 12.0* 11.8*  HGB 10.1* 9.5*  HCT 31.4* 30.1*  PLT 173 176   BMET Recent Labs    10/25/18 0338 10/26/18 0606  NA 138 140  K 4.0 3.5  CL 103 106  CO2 24 25  GLUCOSE 101* 85  BUN 20 12  CREATININE 1.32* 1.18  CALCIUM 7.4* 7.9*   PT/INR No results for input(s): LABPROT, INR in the last 72 hours. CMP     Component Value Date/Time   NA 140 10/26/2018 0606   NA 142 04/16/2018 0948   K 3.5 10/26/2018 0606   CL 106 10/26/2018 0606   CO2 25 10/26/2018 0606   GLUCOSE 85 10/26/2018 0606   GLUCOSE 98 10/04/2010   BUN 12 10/26/2018 0606   BUN 19 04/16/2018 0948   CREATININE 1.18 10/26/2018 0606   CREATININE 0.92 03/31/2016 1051   CALCIUM 7.9 (L) 10/26/2018 0606   PROT 5.6 (L) 10/25/2018 0338   PROT 6.5 03/31/2017 0912   ALBUMIN 2.4 (L) 10/25/2018 0338   ALBUMIN 4.3 03/31/2017 0912   AST 36 10/25/2018 0338   ALT 24 10/25/2018 0338   ALKPHOS 45 10/25/2018 0338   BILITOT 0.7 10/25/2018 0338   BILITOT 0.4 03/31/2017 0912   GFRNONAA 58 (L) 10/26/2018 0606   GFRAA  >60 10/26/2018 0606   Lipase     Component Value Date/Time   LIPASE 38 10/23/2018 1344       Studies/Results: No results found.  Anti-infectives: Anti-infectives (From admission, onward)   Start     Dose/Rate Route Frequency Ordered Stop   10/24/18 0100  piperacillin-tazobactam (ZOSYN) IVPB 3.375 g     3.375 g 12.5 mL/hr over 240 Minutes Intravenous Every 8 hours 10/23/18 1951     10/23/18 1645  piperacillin-tazobactam (ZOSYN) IVPB 3.375 g     3.375 g 100 mL/hr over 30 Minutes Intravenous  Once 10/23/18 1635 10/23/18 1711       Assessment/Plan H/o DVT H/o CAD - hold aspirin 81mg  Asthma HTN HLD GERD AKI - Cr 1.18  Perforated pyloric channel ulcer S/p Exploratory laparotomy with [name] patch repair of perforated ulcer with omentum, Stamm gastrostomy tube placement 12/28 Dr. Dwain [name] - POD 3 - BID wet to dry dressing changes to midline wound - plan for UGI today - continue IV zosyn and PPI - needs 5 total days abx, WBC stable - JP drainput output SS -mobilize and pulm toilet  ID - zosyn 12/28>> FEN - IVF, NPO, G-tube to gravity VTE - SCDs, lovenox Foley - d/c 12/30 Follow up - Dr. Dwain [name]  LOS: 3 days    [name] , [facility] 10/26/2018, 9:39 AM Pager: [phone]

## 2018-10-26 NOTE — Care Management Important Message (Signed)
Important Message  Patient Details  Name: [name] MRN: [id number] Date of Birth: August 05, 1935   Medicare Important Message Given:  Yes    Shanelle Clontz 10/26/2018, 2:16 PM

## 2018-10-26 NOTE — Progress Notes (Signed)
OT Cancellation Note  Patient Details Name: [name] MRN: [id number] DOB: 25-Aug-1935   Cancelled Treatment:    Reason Eval/Treat Not Completed: Medical issues which prohibited therapy(Pt getting intake interview now and scheduled for esophagram)   Revonda StandardAllison Cecil Cranker(Jelenek) [place] OTR/L Acute Rehabilitation Services Pager: [phone] Office: [phone]   Sandrea HughsLLYSON  JELENEK 10/26/2018, 2:03 PM

## 2018-10-26 NOTE — Plan of Care (Signed)
  Problem: Coping: Goal: Level of anxiety will decrease Outcome: Progressing   Problem: Pain Managment: Goal: General experience of comfort will improve Outcome: Progressing   Problem: Safety: Goal: Ability to remain free from injury will improve Outcome: Progressing   

## 2018-10-26 NOTE — Progress Notes (Signed)
[facility] a [facility] [employer] with regards to patient having SVT episodes; at 2230 he had 25 beats run and at 2353 he had 28 beats respectively. Pt was checked and vital signs taken; asymptomatic with no complaints of pain or discomfort or signs of distress; VS within normal limits. MD on call (Dr. Drexel IhaKissinger) paged and notified of cardiac readings but gave no further orders but to monitor pt throughout the shift.

## 2018-10-26 NOTE — Care Management Note (Signed)
Case Management Note  Patient Details  Name: [name] MRN: [id number] Date of Birth: 1935/09/06  Subjective/Objective:                    Action/Plan:  Continuing to follow for discharge needs Expected Discharge Date:                  Expected Discharge Plan:  Home/Self Care  In-House Referral:     Discharge planning Services     Post Acute Care Choice:    Choice offered to:     DME Arranged:  N/A DME Agency:  NA  HH Arranged:    HH Agency:     Status of Service:  In process, will continue to follow  If discussed at Long Length of Stay Meetings, dates discussed:    Additional Comments:  [name], [name], RN 10/26/2018, 11:22 AM

## 2018-10-26 NOTE — Progress Notes (Signed)
Called by RN for urinary retention.  Had one episode last night and required cath.  Now with 659 present.  Will replace cath.  If UGI normal, will start flomax and plan for a voiding trial maybe Thursday.   Letha CapeKelly E Kiyaan Haq 12:07 PM 10/26/2018

## 2018-10-27 LAB — URINALYSIS, ROUTINE W REFLEX MICROSCOPIC
Bacteria, UA: NONE SEEN
Bilirubin Urine: NEGATIVE
Glucose, UA: NEGATIVE mg/dL
Ketones, ur: 20 mg/dL — AB
LEUKOCYTES UA: NEGATIVE
Nitrite: NEGATIVE
Protein, ur: 100 mg/dL — AB
RBC / HPF: >50 RBC/hpf — ABNORMAL HIGH (ref 0–5)
Specific Gravity, Urine: 1.015 (ref 1.005–1.030)
pH: 8 (ref 5.0–8.0)

## 2018-10-27 LAB — PHOSPHORUS: Phosphorus: 1.7 mg/dL — ABNORMAL LOW (ref 2.5–4.6)

## 2018-10-27 LAB — MAGNESIUM: MAGNESIUM: 1.9 mg/dL (ref 1.7–2.4)

## 2018-10-27 MED ORDER — TAMSULOSIN HCL 0.4 MG PO CAPS
0.4000 mg | ORAL_CAPSULE | Freq: Every day | ORAL | Status: DC
  Administered 2018-10-27 – 2018-10-29 (×3): 0.4 mg via ORAL
  Filled 2018-10-27 (×3): qty 1

## 2018-10-27 NOTE — Progress Notes (Signed)
Patient ID: [name], male   DOB: 22-Jul-1935, 84 y.o.   MRN: [id number]    4 Days Post-Op  Subjective: No new complaints.  Foley replaced yesterday with no issues, but now with bloody urine.  No nausea.  Small soft BM.  Objective: Vital signs in last 24 hours: Temp:  [98.6 F (37 C)-99.1 F (37.3 C)] 98.9 F (37.2 C) (01/01 0600) Pulse Rate:  [84-86] 86 (01/01 0600) Resp:  [12-16] 14 (01/01 0600) BP: (135-158)/(65-71) 135/65 (01/01 0600) SpO2:  [94 %-99 %] 96 % (01/01 0917) Last BM Date: 10/26/18  Intake/Output from previous day: 12/31 0701 - 01/01 0700 In: 46.5 [IV Piggyback:46.5] Out: 2030 [Urine:1125; Drains:905] Intake/Output this shift: Total I/O In: -  Out: 640 [Urine:400; Drains:240]  PE: Abd: soft, minimally tender, midline incision is clean and packed, JP with serosang output, g-tube with minimal output.   GU: foley with light red kool-aid appearing urine.   Lab Results:  Recent Labs    10/25/18 0709 10/26/18 0606  WBC 12.0* 11.8*  HGB 10.1* 9.5*  HCT 31.4* 30.1*  PLT 173 176   BMET Recent Labs    10/25/18 0338 10/26/18 0606  NA 138 140  K 4.0 3.5  CL 103 106  CO2 24 25  GLUCOSE 101* 85  BUN 20 12  CREATININE 1.32* 1.18  CALCIUM 7.4* 7.9*   PT/INR No results for input(s): LABPROT, INR in the last 72 hours. CMP     Component Value Date/Time   NA 140 10/26/2018 0606   NA 142 04/16/2018 0948   K 3.5 10/26/2018 0606   CL 106 10/26/2018 0606   CO2 25 10/26/2018 0606   GLUCOSE 85 10/26/2018 0606   GLUCOSE 98 10/04/2010   BUN 12 10/26/2018 0606   BUN 19 04/16/2018 0948   CREATININE 1.18 10/26/2018 0606   CREATININE 0.92 03/31/2016 1051   CALCIUM 7.9 (L) 10/26/2018 0606   PROT 5.6 (L) 10/25/2018 0338   PROT 6.5 03/31/2017 0912   ALBUMIN 2.4 (L) 10/25/2018 0338   ALBUMIN 4.3 03/31/2017 0912   AST 36 10/25/2018 0338   ALT 24 10/25/2018 0338   ALKPHOS 45 10/25/2018 0338   BILITOT 0.7 10/25/2018 0338   BILITOT 0.4 03/31/2017 0912   GFRNONAA 58 (L) 10/26/2018 0606   GFRAA >60 10/26/2018 0606   Lipase     Component Value Date/Time   LIPASE 38 10/23/2018 1344       Studies/Results: Dg Ugi W/water Sol Cm  Result Date: 10/26/2018 CLINICAL DATA:  84 year old male postoperative day 3 from exploratory laparotomy and repair of perforated gastric ulcer. Right upper quadrant surgical drain and Stamm gastrostomy placed intraoperatively. EXAM: UPPER GI SERIES WITH KUB TECHNIQUE: After obtaining a scout radiograph a routine upper GI series was performed using water-soluble contrast FLUOROSCOPY TIME:  Fluoroscopy Time:  2 minutes 18 seconds Radiation Exposure Index (if provided by the fluoroscopic device): Number of Acquired Spot Images: 0 COMPARISON:  Preoperative CT Abdomen and Pelvis 10/23/2018. FINDINGS: AP supine scout view obtained demonstrating the midline gastrostomy tube and right upper quadrant surgical drain. Small volume of gas within the stomach. Paucity of gas elsewhere, nonobstructed bowel-gas pattern. A single contrast study was undertaken with water-soluble contrast and the patient tolerated this well and without difficulty. The fluoroscopy table was inclined at about 35 degrees. After the patient tolerated the small volume of p.o. water then contrast was administered PO and the stomach was observed fluoroscopically. Initially the gastrostomy tube was unclamped, and contrast entering the stomach  preferentially drained through the tube. The tube was then clamped and multiple additional contrast swallows observed. No obstruction to the flow of contrast through the distal esophagus and into the stomach. The gastroesophageal junction appears normal. Good gastric distention was obtained in there was prompt gastric emptying to the duodenum. The patient was also positioned right lateral oblique. The surgical drain projects along the lesser curve of the stomach. No contrast leak or uptake by the drain occurred over the course of the  exam. Satisfactory appearance of the stomach and proximal small bowel. Normal duodenum configuration. Normal ligament of Treitz position. The gastrostomy tube was unclamped at the conclusion of the study and after a 5-10 minute delay 2 post evacuation films were obtained. These demonstrate a small volume of residual contrast in the stomach, duodenum and mostly the proximal jejunum. No contrast leak identified. IMPRESSION: Satisfactory postoperative water-soluble upper GI with no gastric or proximal small bowel leak identified. Electronically Signed   By: Odessa Fleming M.D.   On: 10/26/2018 15:59    Anti-infectives: Anti-infectives (From admission, onward)   Start     Dose/Rate Route Frequency Ordered Stop   10/24/18 0100  piperacillin-tazobactam (ZOSYN) IVPB 3.375 g     3.375 g 12.5 mL/hr over 240 Minutes Intravenous Every 8 hours 10/23/18 1951 10/28/18 2359   10/23/18 1645  piperacillin-tazobactam (ZOSYN) IVPB 3.375 g     3.375 g 100 mL/hr over 30 Minutes Intravenous  Once 10/23/18 1635 10/23/18 1711       Assessment/Plan H/o DVT H/o CAD - hold aspirin 81mg  Asthma HTN HLD GERD AKI - Cr 1.18 Hematuria/urinary retention - no blood on insertion of foley yesterday so this is new, initial thought was foley trauma.  Check UA.  Leave foley for retention for now.  Start flomax.  Patient sees urology yearly for his prostate.  Made need their input if further hematuria persists.   Perforated pyloric channel ulcer S/pExploratory laparotomy with Cheree Ditto patch repair of perforated ulcer with omentum, Stamm gastrostomy tube placement12/28 Dr. Dwain Sarna - POD 4 - BID wet to dry dressing changes to midline wound - UGI negative, will start clear liquids today - continue IV zosyn and PPI - needs 5 total days abx, WBC stable, DC on 1/2, end date already put in place - JP drainput output SS -mobilize and pulm toilet  ID -zosyn 12/28>>1/2 FEN -IVF, clear liquids VTE -SCDs, lovenox Foley  -reinserted on 12/31, initiate flomax 1/1 Follow up -Dr. Dwain Sarna   LOS: 4 days    Letha Cape , [facility] 10/27/2018, 9:23 AM Pager: [phone]

## 2018-10-27 NOTE — Evaluation (Signed)
Clinical/Bedside Swallow Evaluation Patient Details  Name: [name] MRN: [id number] Date of Birth: 1935-07-09  Today's Date: 10/27/2018 Time: SLP Start Time (ACUTE ONLY): 0930 SLP Stop Time (ACUTE ONLY): 0944 SLP Time Calculation (min) (ACUTE ONLY): 14 min  Past Medical History:  Past Medical History:  Diagnosis Date  . Arthritis    "scattered" (10/26/2018)  . C. difficile diarrhea   . CAD (coronary artery disease) 2005   s/p PCI of RCA  . Carotid artery stenosis    1-39% bilateral by dopplers 10/2016  . Carotid stenosis    "? side; Dr. Edilia [name]"  . Cognitive impairment, mild, so stated   . Colitis   . Erectile dysfunction   . GERD (gastroesophageal reflux disease)   . Hyperlipidemia   . Hypertension   . Perforated stomach, acute 10/23/2018  . Peripheral vascular disease (HCC)   . Vertebrobasilar artery syndrome   . Vertigo    Past Surgical History:  Past Surgical History:  Procedure Laterality Date  . APPENDECTOMY  1954  . COLONOSCOPY    . ESOPHAGOGASTRODUODENOSCOPY (EGD) WITH ESOPHAGEAL DILATION  09/2018  . EYE SURGERY    . LAPAROTOMY N/A 10/23/2018   Procedure: EXPLORATORY LAPAROTOMY WITH GRAHAM PATCH FOR PERFORATED ULCER AND PLACEMENT OF GASTRIC TUBE;  Surgeon: Robert Loron, MD;  Location[facility] OR;  Service: General;  Laterality: N/A;  . NASAL SINUS SURGERY  05/2018  . PR VEIN BYPASS GRAFT,AORTO-FEM-POP  2007  . RETINAL DETACHMENT SURGERY     "? side"   HPI:  Patient is an 84 yo male who presents for Pneumoperitoneum due to perforated gastric ulcer status post [name] patch and gastrostomy    Assessment / Plan / Recommendation Clinical Impression  Oropharyngeal swallow appears within functional limits for consistencies assessed. Pt on clear liquid diet post procedure. No overt s/sx of aspiration with 3 oz water challenge. Pt notes hx of long standing chronic cough, had barium swallow evaluation 07/2018 with nonspecific esophageal dysmotility. He states with  breathing treatments during this admission cough has improved some. He is to follow up with pulmonologist. Per MD discretion advance diet as tolerated. No further ST needs identified.   SLP Visit Diagnosis: Dysphagia, unspecified (R13.10)    Aspiration Risk  Mild aspiration risk    Diet Recommendation     Medication Administration: (per MD discretion)    Other  Recommendations Oral Care Recommendations: Oral care BID   Follow up Recommendations None      Frequency and Duration            Prognosis Prognosis for Safe Diet Advancement: Good Barriers to Reach Goals: Time post onset      Swallow Study   General Date of Onset: 10/23/18 HPI: Patient is an 84 yo male who presents for Pneumoperitoneum due to perforated gastric ulcer status post [name] patch and gastrostomy  Type of Study: Bedside Swallow Evaluation Previous Swallow Assessment: barium swallow eval 07/2018: nonspecific esophageal dysmotility Diet Prior to this Study: Thin liquids(clear liquids ) Temperature Spikes Noted: No Respiratory Status: Room air History of Recent Intubation: Yes Length of Intubations (days): 1 days Date extubated: 10/24/18 Behavior/Cognition: Alert;Cooperative;Pleasant mood Oral Cavity Assessment: Within Functional Limits Oral Cavity - Dentition: Adequate natural dentition Vision: Functional for self-feeding Self-Feeding Abilities: Able to feed self Patient Positioning: Upright in chair Baseline Vocal Quality: Normal Volitional Cough: Strong Volitional Swallow: Able to elicit    Oral/Motor/Sensory Function Overall Oral Motor/Sensory Function: Within functional limits   Ice Chips Ice chips: Impaired   Thin Liquid  Thin Liquid: Within functional limits Presentation: Cup;Straw    Nectar Thick Nectar Thick Liquid: Not tested   Honey Thick Honey Thick Liquid: Not tested   Puree Puree: Not tested(due to clear liquid diet restrictions)   Solid     Solid: Not tested(due to clear liquid  diet restrictions)      [name] E Robert Fanton MA, CCC-SLP  Acute Rehab Speech Language Pathologist  10/27/2018,9:55 AM

## 2018-10-27 NOTE — Progress Notes (Addendum)
Physical Therapy Treatment Patient Details Name: [name] MRN: [id number] DOB: 01/08/1935 Today's Date: 10/27/2018    History of Present Illness Patient is an 84 yo male who presents for Pneumoperitoneum due to perforated gastric ulcer status post [name] patch and gastrostomy     PT Comments    Patient progressing well towards PT goals. Pt just finished getting washed up prior to PT arrival so tired but agreeable to PT. Continues to demonstrate some mild instability at times during gait but able to self correct, most notable with head turns. Needs to be able to negotiate 1 flight of stairs at home. Will perform next session as tolerated. Reviewed bracing as pt reports most of his pain is with coughing. Encouraged walking with nursing a few times per day to improve overall mobility. Will follow.    Follow Up Recommendations  No PT follow up;Supervision - Intermittent     Equipment Recommendations  None recommended by PT    Recommendations for Other Services       Precautions / Restrictions Precautions Precautions: Fall Precaution Comments: JP drain, foley, and foley drain Restrictions Weight Bearing Restrictions: No    Mobility  Bed Mobility               General bed mobility comments: received in chair  Transfers Overall transfer level: Needs assistance Equipment used: None Transfers: Sit to/from Stand Sit to Stand: Min guard         General transfer comment: min guard for safety  Ambulation/Gait Ambulation/Gait assistance: Min guard Gait Distance (Feet): 350 Feet Assistive device: None Gait Pattern/deviations: Drifts right/left;Step-through pattern;Decreased stride length Gait velocity: decreased   General Gait Details: Slow, mildly unsteady gait with drifting to right/left noted esp with head turns, able to self correct with a few instances of LOB.   Stairs             Wheelchair Mobility    Modified Rankin (Stroke Patients Only)        Balance Overall balance assessment: Needs assistance Sitting-balance support: Feet supported;No upper extremity supported Sitting balance-Leahy Scale: Good     Standing balance support: During functional activity Standing balance-Leahy Scale: Fair Standing balance comment: able to stand self supported without assist, mild instability noted during ambulation                            Cognition Arousal/Alertness: Awake/alert Behavior During Therapy: WFL for tasks assessed/performed Overall Cognitive Status: Within Functional Limits for tasks assessed                                        Exercises      General Comments General comments (skin integrity, edema, etc.): Wife present during session.      Pertinent Vitals/Pain Pain Assessment: Faces Faces Pain Scale: Hurts a little bit Pain Location: abdomen Pain Descriptors / Indicators: Operative site guarding;Sore Pain Intervention(s): Monitored during session    Home Living                      Prior Function            PT Goals (current goals can now be found in the care plan section) Progress towards PT goals: Progressing toward goals    Frequency    Min 3X/week      PT Plan Current plan remains  appropriate    Co-evaluation              AM-PAC PT "6 Clicks" Mobility   Outcome Measure  Help needed turning from your back to your side while in a flat bed without using bedrails?: None Help needed moving from lying on your back to sitting on the side of a flat bed without using bedrails?: None Help needed moving to and from a bed to a chair (including a wheelchair)?: None Help needed standing up from a chair using your arms (e.g., wheelchair or bedside chair)?: A Little Help needed to walk in hospital room?: A Little Help needed climbing 3-5 steps with a railing? : A Little 6 Click Score: 21    End of Session   Activity Tolerance: Patient tolerated treatment  well Patient left: in chair;with call bell/phone within reach;with family/visitor present Nurse Communication: Mobility status PT Visit Diagnosis: Unsteadiness on feet (R26.81)     Time: [id number] PT Time Calculation (min) (ACUTE ONLY): 13 min  Charges:  $Gait Training: 8-22 mins                     Mylo RedShauna Kerstie Agent, PT, DPT Acute Rehabilitation Services Pager [phone] Office [phone]       Blake DivineShauna A Lanier EnsignHartshorne 10/27/2018, 12:20 PM

## 2018-10-28 DIAGNOSIS — R911 Solitary pulmonary nodule: Secondary | ICD-10-CM

## 2018-10-28 LAB — MAGNESIUM: MAGNESIUM: 1.7 mg/dL (ref 1.7–2.4)

## 2018-10-28 LAB — PHOSPHORUS: Phosphorus: 2.3 mg/dL — ABNORMAL LOW (ref 2.5–4.6)

## 2018-10-28 MED ORDER — ENSURE ENLIVE PO LIQD
237.0000 mL | Freq: Two times a day (BID) | ORAL | Status: DC
  Administered 2018-10-29: 237 mL via ORAL

## 2018-10-28 MED ORDER — ATORVASTATIN CALCIUM 80 MG PO TABS
80.0000 mg | ORAL_TABLET | Freq: Every day | ORAL | Status: DC
  Administered 2018-10-28: 80 mg via ORAL
  Filled 2018-10-28: qty 1

## 2018-10-28 MED ORDER — METHOCARBAMOL 500 MG PO TABS: 500.0000 mg | ORAL_TABLET | Freq: Three times a day (TID) | ORAL | Status: DC | PRN

## 2018-10-28 MED ORDER — ASPIRIN EC 81 MG PO TBEC
81.0000 mg | DELAYED_RELEASE_TABLET | Freq: Every day | ORAL | Status: DC
  Administered 2018-10-28 – 2018-10-29 (×2): 81 mg via ORAL
  Filled 2018-10-28 (×2): qty 1

## 2018-10-28 MED ORDER — AMLODIPINE BESYLATE 5 MG PO TABS
5.0000 mg | ORAL_TABLET | Freq: Every day | ORAL | Status: DC
  Administered 2018-10-28 – 2018-10-29 (×2): 5 mg via ORAL
  Filled 2018-10-28: qty 2
  Filled 2018-10-28: qty 1

## 2018-10-28 MED ORDER — PANTOPRAZOLE SODIUM 40 MG PO TBEC
40.0000 mg | DELAYED_RELEASE_TABLET | Freq: Two times a day (BID) | ORAL | Status: DC
  Administered 2018-10-28 – 2018-10-29 (×3): 40 mg via ORAL
  Filled 2018-10-28 (×3): qty 1

## 2018-10-28 MED ORDER — ADULT MULTIVITAMIN W/MINERALS CH
1.0000 | ORAL_TABLET | Freq: Every day | ORAL | Status: DC
  Administered 2018-10-28 – 2018-10-29 (×2): 1 via ORAL
  Filled 2018-10-28 (×2): qty 1

## 2018-10-28 MED ORDER — DONEPEZIL HCL 10 MG PO TABS
10.0000 mg | ORAL_TABLET | Freq: Every day | ORAL | Status: DC
  Administered 2018-10-28 – 2018-10-29 (×2): 10 mg via ORAL
  Filled 2018-10-28 (×2): qty 1

## 2018-10-28 MED ORDER — OXYCODONE HCL 5 MG PO TABS: 5.0000 mg | ORAL_TABLET | ORAL | Status: DC | PRN

## 2018-10-28 MED ORDER — ACETAMINOPHEN 325 MG PO TABS: 650.0000 mg | ORAL_TABLET | Freq: Four times a day (QID) | ORAL | Status: DC | PRN

## 2018-10-28 MED ORDER — HYDROCHLOROTHIAZIDE 25 MG PO TABS
25.0000 mg | ORAL_TABLET | Freq: Every day | ORAL | Status: DC
  Administered 2018-10-28 – 2018-10-29 (×2): 25 mg via ORAL
  Filled 2018-10-28 (×2): qty 1

## 2018-10-28 MED ORDER — GABAPENTIN 100 MG PO CAPS
200.0000 mg | ORAL_CAPSULE | Freq: Every day | ORAL | Status: DC
  Administered 2018-10-28: 200 mg via ORAL
  Filled 2018-10-28: qty 2

## 2018-10-28 NOTE — Progress Notes (Signed)
5 Days Post-Op   Subjective/Chief Complaint: Doing well, swallowing fine, foley out already, having bowel function   Objective: Vital signs in last 24 hours: Temp:  [99.4 F (37.4 C)-100 F (37.8 C)] 100 F (37.8 C) (01/02 0550) Pulse Rate:  [73-84] 73 (01/02 0550) Resp:  [16-20] 18 (01/02 0550) BP: (137-168)/(65-71) 137/68 (01/02 0550) SpO2:  [95 %-98 %] 95 % (01/02 0550) Last BM Date: 10/27/18  Intake/Output from previous day: 01/01 0701 - 01/02 0700 In: -  Out: 3850 [Urine:3550; Drains:300] Intake/Output this shift: Total I/O In: -  Out: 800 [Urine:800]  Resp: clear to auscultation bilaterally Cardio: regular rate and rhythm GI: soft nt/nd wound clean, jp serosan, g tube in place  Lab Results:  Recent Labs    10/26/18 0606  WBC 11.8*  HGB 9.5*  HCT 30.1*  PLT 176   BMET Recent Labs    10/26/18 0606  NA 140  K 3.5  CL 106  CO2 25  GLUCOSE 85  BUN 12  CREATININE 1.18  CALCIUM 7.9*   PT/INR No results for input(s): LABPROT, INR in the last 72 hours. ABG No results for input(s): PHART, HCO3 in the last 72 hours.  Invalid input(s): PCO2, PO2  Studies/Results: Dg Ugi W/water Sol Cm  Result Date: 10/26/2018 CLINICAL DATA:  84 year old male postoperative day 3 from exploratory laparotomy and repair of perforated gastric ulcer. Right upper quadrant surgical drain and Stamm gastrostomy placed intraoperatively. EXAM: UPPER GI SERIES WITH KUB TECHNIQUE: After obtaining a scout radiograph a routine upper GI series was performed using water-soluble contrast FLUOROSCOPY TIME:  Fluoroscopy Time:  2 minutes 18 seconds Radiation Exposure Index (if provided by the fluoroscopic device): Number of Acquired Spot Images: 0 COMPARISON:  Preoperative CT Abdomen and Pelvis 10/23/2018. FINDINGS: AP supine scout view obtained demonstrating the midline gastrostomy tube and right upper quadrant surgical drain. Small volume of gas within the stomach. Paucity of gas elsewhere,  nonobstructed bowel-gas pattern. A single contrast study was undertaken with water-soluble contrast and the patient tolerated this well and without difficulty. The fluoroscopy table was inclined at about 35 degrees. After the patient tolerated the small volume of p.o. water then contrast was administered PO and the stomach was observed fluoroscopically. Initially the gastrostomy tube was unclamped, and contrast entering the stomach preferentially drained through the tube. The tube was then clamped and multiple additional contrast swallows observed. No obstruction to the flow of contrast through the distal esophagus and into the stomach. The gastroesophageal junction appears normal. Good gastric distention was obtained in there was prompt gastric emptying to the duodenum. The patient was also positioned right lateral oblique. The surgical drain projects along the lesser curve of the stomach. No contrast leak or uptake by the drain occurred over the course of the exam. Satisfactory appearance of the stomach and proximal small bowel. Normal duodenum configuration. Normal ligament of Treitz position. The gastrostomy tube was unclamped at the conclusion of the study and after a 5-10 minute delay 2 post evacuation films were obtained. These demonstrate a small volume of residual contrast in the stomach, duodenum and mostly the proximal jejunum. No contrast leak identified. IMPRESSION: Satisfactory postoperative water-soluble upper GI with no gastric or proximal small bowel leak identified. Electronically Signed   By: Odessa FlemingH  Hall M.D.   On: 10/26/2018 15:59    Anti-infectives: Anti-infectives (From admission, onward)   Start     Dose/Rate Route Frequency Ordered Stop   10/24/18 0100  piperacillin-tazobactam (ZOSYN) IVPB 3.375 g  Status:  Discontinued     3.375 g 12.5 mL/hr over 240 Minutes Intravenous Every 8 hours 10/23/18 1951 10/28/18 0906   10/23/18 1645  piperacillin-tazobactam (ZOSYN) IVPB 3.375 g     3.375  g 100 mL/hr over 30 Minutes Intravenous  Once 10/23/18 1635 10/23/18 1711      Assessment/Plan: H/o DVT H/o CAD - hold aspirin 81mg  Asthma HTN HLD GERD AKI - Cr 1.18 Hematuria/urinary retention - foley out today, will follow Perforated pyloric channel ulcer S/pExploratory laparotomy with Cheree Ditto patch repair of perforated ulcer with omentum, Stamm gastrostomy tube placement12/28 Dr. Dwain Sarna - POD5 - BID wet to dry dressing changes to midline wound - full  liquids - dc zosyn -home meds - JP drainput output SS, dc today -mobilize and pulm toile VTE -SCDs, lovenox Foley -reinserted on 12/31, initiate flomax 1/1 Follow up -Dr. Carman Ching 10/28/2018

## 2018-10-28 NOTE — Progress Notes (Signed)
Occupational Therapy Treatment Patient Details Name: [name] MRN: [id number] DOB: 11/11/1934 Today's Date: 10/28/2018    History of present illness Patient is an 84 yo male who presents for Pneumoperitoneum due to perforated gastric ulcer status post Cheree Ditto patch and gastrostomy    OT comments  Pt is a pleasant 84 yo male s/p above diagnoses. Pt stood at sink for ADLs with set-upA for materials and supervisionA. Pt performing tasks with fair balance. Pt ambulating 550' with no AD and fair balance with slight posterior lean- posture related. 1 LOB episode, but able to right reaction to stand upright. HR and BP remained WNLs for pt. No assistance required from this OTR. Pt continues to be progressing well with goals. No post acute care follow-up required.    Follow Up Recommendations  No OT follow up    Equipment Recommendations  None recommended by OT    Recommendations for Other Services      Precautions / Restrictions Precautions Precautions: Fall Precaution Comments: JP drain Restrictions Weight Bearing Restrictions: No       Mobility Bed Mobility Overal bed mobility: Modified Independent                Transfers Overall transfer level: Needs assistance Equipment used: None Transfers: Sit to/from Stand Sit to Stand: Supervision         General transfer comment: min guard for safety    Balance Overall balance assessment: Mild deficits observed, not formally tested Sitting-balance support: Feet supported;No upper extremity supported Sitting balance-Leahy Scale: Good     Standing balance support: During functional activity Standing balance-Leahy Scale: Fair Standing balance comment: able to stand self supported without assist, mild instability noted during ambulation                           ADL either performed or assessed with clinical judgement   ADL Overall ADL's : Needs assistance/impaired     Grooming: Wash/dry hands;Wash/dry  face;Oral care;Applying deodorant;Supervision/safety   Upper Body Bathing: Supervision/ safety;With caregiver independent assisting;Cueing for safety;Standing   Lower Body Bathing: Supervison/ safety;Sit to/from stand   Upper Body Dressing : Supervision/safety;Standing   Lower Body Dressing: Supervision/safety;Sit to/from stand   Toilet Transfer: Supervision/safety;Grab bars   Toileting- Architect and Hygiene: Supervision/safety;Sit to/from stand       Functional mobility during ADLs: Supervision/safety General ADL Comments: pt requiring increased time for task and set-upA with supervisionA. pt stood for all ADL tasks at sink.     Vision   Vision Assessment?: No apparent visual deficits   Perception     Praxis      Cognition Arousal/Alertness: Awake/alert Behavior During Therapy: WFL for tasks assessed/performed Overall Cognitive Status: Within Functional Limits for tasks assessed                                          Exercises     Shoulder Instructions       General Comments      Pertinent Vitals/ Pain       Pain Assessment: No/denies pain  Home Living                                          Prior Functioning/Environment  Frequency  Min 2X/week        Progress Toward Goals  OT Goals(current goals can now be found in the care plan section)  Progress towards OT goals: Progressing toward goals  Acute Rehab OT Goals Patient Stated Goal: to go home OT Goal Formulation: With patient Time For Goal Achievement: 11/01/18 Potential to Achieve Goals: Good ADL Goals Pt Will Perform Grooming: standing;Independently Pt Will Transfer to Toilet: ambulating;regular height toilet;Independently Pt Will Perform Tub/Shower Transfer: with modified independence;ambulating;Tub transfer Additional ADL Goal #1: Pt will gather items necessary for ADL around room independently.  Plan Discharge plan  remains appropriate    Co-evaluation          OT goals addressed during session: ADL's and self-care;Strengthening/ROM      AM-PAC OT "6 Clicks" Daily Activity     Outcome Measure   Help from another person eating meals?: None Help from another person taking care of personal grooming?: None Help from another person toileting, which includes using toliet, bedpan, or urinal?: A Little Help from another person bathing (including washing, rinsing, drying)?: A Little Help from another person to put on and taking off regular upper body clothing?: None Help from another person to put on and taking off regular lower body clothing?: A Little 6 Click Score: 21    End of Session Equipment Utilized During Treatment: Gait belt  OT Visit Diagnosis: Unsteadiness on feet (R26.81);Pain   Activity Tolerance Patient tolerated treatment well   Patient Left in chair;with call bell/phone within reach   Nurse Communication Mobility status        Time: 1020-1043 OT Time Calculation (min): 23 min  Charges: OT General Charges $OT Visit: 1 Visit OT Treatments $Self Care/Home Management : 8-22 mins $Therapeutic Activity: 8-22 mins  Cristi Loron) Glendell Docker OTR/L Acute Rehabilitation Services Pager: [phone] Office: [phone]    Sandrea Hughs 10/28/2018, 11:42 AM

## 2018-10-28 NOTE — Progress Notes (Signed)
Initial Nutrition Assessment  DOCUMENTATION CODES:   Not applicable  INTERVENTION:   -Ensure Enlive po BID, each supplement provides 350 kcal and 20 grams of protein -MVI with minerals daily  NUTRITION DIAGNOSIS:   Increased nutrient needs related to wound healing, post-op healing as evidenced by estimated needs.  GOAL:   Patient will meet greater than or equal to 90% of their needs  MONITOR:   PO intake, Supplement acceptance, Diet advancement, Labs, Weight trends, Skin, I & O's  REASON FOR ASSESSMENT:   Malnutrition Screening Tool    ASSESSMENT:   [age] yom with history of prior dvt, cad with stent 15 years ago no issues currently who also has undergone swallowing eval recently.  He noted acute onset upper abd pain today that worsened.   Pt admitted with perforated viscous with peritonitis.   12/28- s/p Procedure: Exploratory laparotomy with [name] patch repair of perforated ulcer with omentum, Stamm gastrostomy tube placement 12/29- extubated, NGT d/c  1/1- UGI negative, advanced to clear liquid diet with advancement as tolerated  Reviewed I/O's: -3.9 L x 24 hours and -1.7 L since admission  Drains: 300 ml output x 24 hours  Spoke with pt and wife at bedside. Both report that pt's appetite has slowly declined over the past year ("he doesn't eat as much as he used to"). Per pt wife, pt maintains a very active lifestyle (goes to [employer] and [employer] daily, does multiple household chores, and goes out to breakfast with friends daily). Typical intake is as follows: Breakfast: english muffin and coffee; AM snack: apple pie and breakfast burrito from [employer]; PM snack: pimento cheese and crackers; Dinner: meat, starch, and vegetable.   Pt estimates a 15# wt loss over the past year. He reports his UBW is around 150#. Noted pt has experienced a 3.3% wt loss over the past 6 months, which is not significant for time frame.   Pt shares making good progress with mobility- hs  already completed 4 laps around hallway. He is tolerating full liquid diet without difficulty- consumed 100% of lunch tray per his wife's report. Discussed importance of good meal and supplement intake to promote healing; pt amenable to Ensure supplements.   Labs reviewed: Phos: 2.3, K and Mg WDL.   NUTRITION - FOCUSED PHYSICAL EXAM:    Most Recent Value  Orbital Region  Mild depletion  Upper Arm Region  Mild depletion  Thoracic and Lumbar Region  No depletion  Buccal Region  No depletion  Temple Region  No depletion  Clavicle Bone Region  Mild depletion  Clavicle and Acromion Bone Region  No depletion  Scapular Bone Region  No depletion  Dorsal Hand  No depletion  Patellar Region  No depletion  Anterior Thigh Region  No depletion  Posterior Calf Region  Mild depletion  Edema (RD Assessment)  None  Hair  Reviewed  Eyes  Reviewed  Mouth  Reviewed  Skin  Reviewed  Nails  Reviewed       Diet Order:   Diet Order            Diet full liquid Room service appropriate? Yes; Fluid consistency: Thin  Diet effective now              EDUCATION NEEDS:   Education needs have been addressed  Skin:  Skin Assessment: Skin Integrity Issues: Skin Integrity Issues:: Incisions Incisions: closed abdomen  Last BM:  10/27/17  Height:   Ht Readings from Last 1 Encounters:  10/23/18 5\' 6"  (  1.676 m)    Weight:   Wt Readings from Last 1 Encounters:  10/23/18 63.5 kg    Ideal Body Weight:  64.5 kg  BMI:  Body mass index is 22.6 kg/m.  Estimated Nutritional Needs:   Kcal:  1700-1900  Protein:  85-100 grams  Fluid:  > 1.7 L    Anitta Tenny A. Mayford Knife, RD, LDN, CDE Pager: [phone] After hours Pager: [phone]

## 2018-10-28 NOTE — Progress Notes (Signed)
NAME:  [name], MRN:  [id number], DOB:  [birth year], LOS: 5 ADMISSION DATE:  10/23/2018, CONSULTATION DATE: October 23, 2018 REFERRING MD: Surgical services, CHIEF COMPLAINT: Knee pain  Brief History   Patient is 84 year old white male, history of coronary disease, DVT, admitted 12/28 with pneumoperitoneum due to a perforated ulcer, status post [name] patch.  He has recovered well since the procedure.  He also notes that he has had a chronic cough, sometimes associated with wheezing for approximately 1 year.  He was given albuterol by his primary care physician as an outpatient and may have benefited some.  He notes that his cough and his wheeze have significantly benefited from nebulized albuterol while he is been hospitalized.  CT scan of his abdomen done at the time of the diagnosis of his perforation showed a right lower lobe nodule and also some subpleural atelectasis versus interstitial disease.  He is preparing for discharge to home soon   Past Medical History:  Diagnosis Date  . Arthritis    "scattered" (10/26/2018)  . C. difficile diarrhea   . CAD (coronary artery disease) 2005   s/p PCI of RCA  . Carotid artery stenosis    1-39% bilateral by dopplers 10/2016  . Carotid stenosis    "? side; Dr. Edilia Boickson"  . Cognitive impairment, mild, so stated   . Colitis   . Erectile dysfunction   . GERD (gastroesophageal reflux disease)   . Hyperlipidemia   . Hypertension   . Perforated stomach, acute 10/23/2018  . Peripheral vascular disease (HCC)   . Vertebrobasilar artery syndrome   . Vertigo      Family History  Problem Relation Age of Onset  . Stroke Mother   . Hyperlipidemia Mother   . Hypertension Mother   . Deep vein thrombosis Mother   . Stroke Father   . Hyperlipidemia Father   . Hypertension Father   . Deep vein thrombosis Father   . Diverticulitis Father   . Ulcerative colitis Father   . Colon cancer Neg Hx   . Colon polyps Neg Hx   . Esophageal cancer  Neg Hx   . Stomach cancer Neg Hx   . Rectal cancer Neg Hx      Social History   Socioeconomic History  . Marital status: Married    Spouse name: Not on file  . Number of children: Not on file  . Years of education: Not on file  . Highest education level: Not on file  Occupational History  . Not on file  Social Needs  . Financial resource strain: Not on file  . Food insecurity:    Worry: Not on file    Inability: Not on file  . Transportation needs:    Medical: Not on file    Non-medical: Not on file  Tobacco Use  . Smoking status: Former Smoker    Years: 2.00    Types: Cigars    Last attempt to quit: 05/13/1961    Years since quitting: 57.4  . Smokeless tobacco: Never Used  . Tobacco comment: "never inhaled"  Substance and Sexual Activity  . Alcohol use: Yes    Alcohol/week: 14.0 standard drinks    Types: 14 Glasses of wine per week  . Drug use: Never  . Sexual activity: Not Currently  Lifestyle  . Physical activity:    Days per week: Not on file    Minutes per session: Not on file  . Stress: Not on file  Relationships  . Social  connections:    Talks on phone: Not on file    Gets together: Not on file    Attends religious service: Not on file    Active member of club or organization: Not on file    Attends meetings of clubs or organizations: Not on file    Relationship status: Not on file  . Intimate partner violence:    Fear of current or ex partner: Not on file    Emotionally abused: Not on file    Physically abused: Not on file    Forced sexual activity: Not on file  Other Topics Concern  . Not on file  Social History Narrative   Married with 1 son and 2 daughters.  Retired.   2 caffeinated beverages daily, 3 glasses of wine daily   Never smoker no drug use no tobacco     No Known Allergies    SUBJECTIVE/OVERNIGHT/INTERVAL HX  Feels better, tolerating PO, planning for home soon Notes that the albuterol nebs have helped his cough,  wheeze   Objective   Blood pressure (!) 151/69, pulse 74, temperature 99.6 F (37.6 C), temperature source Oral, resp. rate 17, height 5\' 6"  (1.676 m), weight 63.5 kg, SpO2 100 %.        Intake/Output Summary (Last 24 hours) at 10/28/2018 1704 Last data filed at 10/28/2018 0946 Gross per 24 hour  Intake 627 ml  Output 2845 ml  Net -2218 ml   Filed Weights   10/23/18 1242  Weight: 63.5 kg   Gen: Pleasant, well-nourished, in no distress,  normal affect  ENT: No lesions,  mouth clear,  oropharynx clear, no postnasal drip  Neck: No JVD, no stridor  Lungs: No use of accessory muscles, no crackles or wheezing on normal respiration, no wheeze on forced expiration  Cardiovascular: RRR, heart sounds normal, no murmur or gallops, no peripheral edema  Abdomen: soft and NT, midline wound is dressed  Neuro: alert, awake, non focal    LABS    PULMONARY Recent Labs  Lab 10/23/18 2006  PHART 7.405  PCO2ART 45.9  PO2ART 151.0*  HCO3 28.8*  TCO2 30  O2SAT 99.0    CBC Recent Labs  Lab 10/24/18 0331 10/25/18 0709 10/26/18 0606  HGB 10.5* 10.1* 9.5*  HCT 33.3* 31.4* 30.1*  WBC 15.6* 12.0* 11.8*  PLT 232 173 176    COAGULATION No results for input(s): INR in the last 168 hours.  CARDIAC   Recent Labs  Lab 10/24/18 1356 10/25/18 0338  TROPONINI 0.03* <0.03   No results for input(s): PROBNP in the last 168 hours.   CHEMISTRY Recent Labs  Lab 10/23/18 1344 10/24/18 0331 10/25/18 0338 10/26/18 0606 10/27/18 0220 10/28/18 0214  NA 138 136 138 140  --   --   K 4.4 4.5 4.0 3.5  --   --   CL 99 102 103 106  --   --   CO2 29 24 24 25   --   --   GLUCOSE 103* 115* 101* 85  --   --   BUN 18 20 20 12   --   --   CREATININE 1.17 1.67* 1.32* 1.18  --   --   CALCIUM 9.3 7.5* 7.4* 7.9*  --   --   MG  --   --  1.7 2.0 1.9 1.7  PHOS  --   --  3.3 2.8 1.7* 2.3*   Estimated Creatinine Clearance: 44.1 mL/min (by C-G formula based on SCr of 1.18  mg/dL).  LIVER Recent Labs  Lab 10/23/18 1344 10/25/18 0338  AST 23 36  ALT 18 24  ALKPHOS 83 45  BILITOT 0.6 0.7  PROT 7.1 5.6*  ALBUMIN 3.5 2.4*     INFECTIOUS Recent Labs  Lab 10/25/18 0338  LATICACIDVEN 1.1     Assessment & Plan:   Chronic cough associated at times with wheezing, possibly responsive to bronchodilator therapy.  Consider cough variant asthma.  Contributors could include GERD, allergic rhinitis. Allergy IgE panel negative here. This will need to be worked up as an outpatient. -Needs pulmonary function testing as an outpatient, follow-up in our office.  An appointment has been made for 11/15/2018 with Buelah Manis -he would like to go home with albuterol nebs available since he has benefited while admitted. I think this is OK - we will confirm whether he really needs as we eval PFT and further w/u the cough. I will place an equipment order for the nebulizer.  -When d/c by [facility] order >> albuterol 2.5mg  / 3cc nebs, q4h prn for cough or wheezing.   Abnormal CT abdomen.  He has a rounded right lower lobe pulmonary nodule that will need appropriate follow-up.  He is a former cigar smoker but never cigarettes.  He also had some peripheral atelectasis versus interstitial disease noted on his admission CT scan of the abdomen. -Agree with Dr. Jane Canary recommendation to obtain a dedicated CT scan of the chest as an outpatient once he is discharged.  This will allow [place] to better characterize the peripheral interstitial changes (they may be gone if this was all atelectasis), characterize the right basilar nodule and any other nodules that may be present.   Levy Pupa, MD, PhD 10/28/2018, 5:18 PM [facility] [phone] or if no answer [phone]

## 2018-10-29 MED ORDER — PANTOPRAZOLE SODIUM 40 MG PO TBEC: 40.0000 mg | DELAYED_RELEASE_TABLET | Freq: Two times a day (BID) | ORAL | 1 refills | Status: DC

## 2018-10-29 MED ORDER — TRAMADOL HCL 50 MG PO TABS: 50.0000 mg | ORAL_TABLET | Freq: Four times a day (QID) | ORAL | 0 refills | Status: DC | PRN

## 2018-10-29 MED ORDER — ALBUTEROL SULFATE (2.5 MG/3ML) 0.083% IN NEBU: 2.5000 mg | INHALATION_SOLUTION | Freq: Four times a day (QID) | RESPIRATORY_TRACT | 1 refills | Status: DC | PRN

## 2018-10-29 NOTE — Discharge Instructions (Signed)
[facility]      [facility] [phone]  OPEN ABDOMINAL SURGERY: POST OP INSTRUCTIONS  Always review your discharge instruction sheet given to you by the facility where your surgery was performed.  IF YOU HAVE DISABILITY OR FAMILY LEAVE FORMS, YOU MUST BRING THEM TO THE OFFICE FOR PROCESSING.  PLEASE DO NOT GIVE THEM TO YOUR DOCTOR.  1. A prescription for pain medication may be given to you upon discharge.  Take your pain medication as prescribed, if needed.  If narcotic pain medicine is not needed, then you may take acetaminophen (Tylenol) or ibuprofen (Advil) as needed. 2. Take your usually prescribed medications unless otherwise directed. 3. If you need a refill on your pain medication, please contact your pharmacy. They will contact our office to request authorization.  Prescriptions will not be filled after 5pm or on week-ends. 4. You should follow a light diet the first few days after arrival home, such as soup and crackers, pudding, etc.unless your doctor has advised otherwise. A high-fiber, low fat diet can be resumed as tolerated.   Be sure to include lots of fluids daily. Most patients will experience some swelling and bruising on the chest and neck area.  Ice packs will help.  Swelling and bruising can take several days to resolve 5. Most patients will experience some swelling and bruising in the area of the incision. Ice pack will help. Swelling and bruising can take several days to resolve..  6. It is common to experience some constipation if taking pain medication after surgery.  Increasing fluid intake and taking a stool softener will usually help or prevent this problem from occurring.  A mild laxative (Milk of Magnesia or Miralax) should be taken according to package directions if there are no bowel movements after 48 hours. 7.  You may have steri-strips (small skin tapes) in place directly over the incision.  These strips should be left on the skin for 7-10 days.  If your  surgeon used skin glue on the incision, you may shower in 24 hours.  The glue will flake off over the next 2-3 weeks.  Any sutures or staples will be removed at the office during your follow-up visit. You may find that a light gauze bandage over your incision may keep your staples from being rubbed or pulled. You may shower and replace the bandage daily. 8. ACTIVITIES:  You may resume regular (light) daily activities beginning the next day--such as daily self-care, walking, climbing stairs--gradually increasing activities as tolerated.  You may have sexual intercourse when it is comfortable.  Refrain from any heavy lifting or straining until approved by your doctor. a. You may drive when you no longer are taking prescription pain medication, you can comfortably wear a seatbelt, and you can safely maneuver your car and apply brakes b. Return to Work: ___________________________________ 9. You should see your doctor in the office for a follow-up appointment approximately two weeks after your surgery.  Make sure that you call for this appointment within a day or two after you arrive home to insure a convenient appointment time. OTHER INSTRUCTIONS:  _____________________________________________________________ _____________________________________________________________  WHEN TO CALL YOUR DOCTOR: 1. Fever over 101.0 2. Inability to urinate 3. Nausea and/or vomiting 4. Extreme swelling or bruising 5. Continued bleeding from incision. 6. Increased pain, redness, or drainage from the incision. 7. Difficulty swallowing or breathing 8. Muscle cramping or spasms. 9. Numbness or tingling in hands or feet or around lips.  The clinic staff is available to   answer your questions during regular business hours.  Please don't hesitate to call and ask to speak to one of the nurses if you have concerns.  For further questions, please visit [email or web address]   

## 2018-10-29 NOTE — Plan of Care (Signed)
  Problem: Education: Goal: Knowledge of General Education information will improve Description Including pain rating scale, medication(s)/side effects and non-pharmacologic comfort measures Outcome: Completed/Met   Problem: Health Behavior/Discharge Planning: Goal: Ability to manage health-related needs will improve Outcome: Completed/Met   Problem: Clinical Measurements: Goal: Will remain free from infection Outcome: Completed/Met Goal: Diagnostic test results will improve Outcome: Completed/Met Goal: Respiratory complications will improve Outcome: Completed/Met Goal: Cardiovascular complication will be avoided Outcome: Completed/Met   Problem: Activity: Goal: Risk for activity intolerance will decrease Outcome: Completed/Met   Problem: Nutrition: Goal: Adequate nutrition will be maintained Outcome: Completed/Met   Problem: Coping: Goal: Level of anxiety will decrease Outcome: Completed/Met   Problem: Elimination: Goal: Will not experience complications related to urinary retention Outcome: Completed/Met   Problem: Pain Managment: Goal: General experience of comfort will improve Outcome: Completed/Met   Problem: Safety: Goal: Ability to remain free from injury will improve Outcome: Completed/Met   Problem: Skin Integrity: Goal: Risk for impaired skin integrity will decrease Outcome: Completed/Met

## 2018-10-29 NOTE — Discharge Summary (Addendum)
Patient ID: [name] [id number] [birth year] 84 y.o.  Admit date: 10/23/2018 Discharge date: 10/29/2018  Admitting Diagnosis: Perforated ulcer  Discharge Diagnosis Patient Active Problem List   Diagnosis Date Noted  . History of ETT 10/23/2018  . Perforated ulcer (HCC) 10/23/2018  . Endotracheally intubated   . Pneumoperitoneum   . Globus sensation 07/29/2018  . Preoperative cardiovascular examination 04/16/2018  . Arthropathy of right sacroiliac joint 04/08/2016  . Spinal stenosis, lumbar region, with neurogenic claudication 03/26/2016  . Sciatica of right side 06/13/2015  . CAD (coronary artery disease)   . Cerumen impaction 09/15/2013  . Rotator cuff syndrome of left shoulder 01/27/2013  . Carotid artery stenosis 09/02/2012  . Physical exam, annual 06/23/2012  . Chronic cough 11/23/2009  . SKIN LESIONS, MULTIPLE 06/05/2009  . 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 DISEASE, PERIPHERAL VASCULAR NOS 08/02/2007  . VERTIGO 08/02/2007  . Mild cognitive impairment 04/27/2007  . Hyperlipidemia 03/17/2007  . Essential hypertension 03/17/2007    Consultants PCCM  Reason for Admission: 34 yom with history of prior dvt, cad with stent 15 years ago no issues currently who also has undergone swallowing eval recently.  He noted acute onset upper abd pain today that worsened. Hurt to move or touch. Nothing making better. No n/v. No fever. Nothing like this before. Drinks some wine. No smoking, no nsaid use really he has.  He was seen in er and underwent ct scan that shows likely upper gi perforation and I was asked to see him. His wife and his son are present with him in the ER.  Procedures Exploratory laparotomy with Cheree Ditto patch repair of perforated ulcer with omentum, Stamm gastrostomy tube placement, Dr. Dwain Sarna 10/23/18  Hospital Course:  The patient was admitted and taken to the OR for the above procedure. He had a g-tube placed during surgery as he had been  having some swallow issues previously and in case those persisted postoperatively.  He remained intubated and was taken to the ICU.  PCCM was consulted and he was able to be extubated on POD 1.  His g-tube remained to gravity to allow for healing of the graham patch for several days and he was placed on a protonix gtt which was subsequently transitioned to protonix 40mg  BID when taking in orals.  He received 5 days of abx therapy, zosyn.  He was able to be tx to the floor on POD 2.  He had an UGI on POD 3 which was normal. His diet was able to be adv as tolerated at that time with no further issues.  He did have some urinary retention initially and a foley was replaced as well.  This was ultimately removed and he was able to void well on his own.  Lastly, CCM addressed his chronic cough and placed him on a new type of nebulizer that has worked well.  This will be ordered at discharge.  He was stable for DC home on POD 6 with no further issues  Physical Exam: Gen: NAD Heart: regular Lungs: CTAB Abd: soft, appropriately tender, +BS, midline incision is clean and packed.  Old JP site is covered with minimal leakage.  g-tube site in place and clean.  g-tube clamped.  Allergies as of 10/29/2018   No Known Allergies     Medication List    STOP taking these medications   albuterol 108 (90 Base) MCG/ACT inhaler Commonly known as:  PROVENTIL HFA;VENTOLIN HFA Replaced by:  albuterol (2.5 MG/3ML) 0.083% nebulizer solution  meloxicam 15 MG tablet Commonly known as:  MOBIC   omeprazole 20 MG capsule Commonly known as:  PRILOSEC   omeprazole 40 MG capsule Commonly known as:  PRILOSEC     TAKE these medications   acetaminophen 500 MG tablet Commonly known as:  TYLENOL Take 500 mg by mouth 2 (two) times daily.   albuterol (2.5 MG/3ML) 0.083% nebulizer solution Commonly known as:  PROVENTIL Take 3 mLs (2.5 mg total) by nebulization every 6 (six) hours as needed for wheezing or shortness of  breath. Replaces:  albuterol 108 (90 Base) MCG/ACT inhaler   amLODipine 5 MG tablet Commonly known as:  NORVASC TAKE 1 TABLET BY MOUTH DAILY. PLEASE KEEP UPCOMING APPOINTMENT IN JUNE WITH DOCTOR TURNER FOR FUTURE REFILLS What changed:  See the new instructions.   aspirin EC 81 MG tablet Take 81 mg by mouth daily.   atorvastatin 80 MG tablet Commonly known as:  LIPITOR TAKE 1 TABLET BY MOUTH DAILY   carvedilol 6.25 MG tablet Commonly known as:  COREG TAKE 1 TABLET BY MOUTH TWICE DAILY   cetirizine 10 MG tablet Commonly known as:  ZYRTEC Take 10 mg by mouth daily.   Diclofenac Sodium 2 % Soln Place 1 application onto the skin 2 (two) times daily.   donepezil 10 MG tablet Commonly known as:  ARICEPT TAKE 1 TABLET BY MOUTH DAILY   fish oil-omega-3 fatty acids 1000 MG capsule Take 2 g by mouth 2 (two) times daily.   fluticasone 50 MCG/ACT nasal spray Commonly known as:  FLONASE Place 2 sprays into both nostrils daily. What changed:  when to take this   gabapentin 100 MG capsule Commonly known as:  NEURONTIN Take 2 capsules (200 mg total) by mouth at bedtime.   hydrochlorothiazide 25 MG tablet Commonly known as:  HYDRODIURIL TAKE 1 TABLET BY MOUTH DAILY   OCUSOFT EYELID CLEANSING Pads Place 1 application into both eyes 2 (two) times daily.   pantoprazole 40 MG tablet Commonly known as:  PROTONIX Take 1 tablet (40 mg total) by mouth 2 (two) times daily.   REFRESH OP Place 1 drop into both eyes 2 (two) times daily.   Saw Palmetto 500 MG Caps Take 1,000 mg by mouth 2 (two) times daily.   traMADol 50 MG tablet Commonly known as:  ULTRAM Take 1 tablet (50 mg total) by mouth every 6 (six) hours as needed. What changed:  when to take this        Follow-up Information    Glenford Bayley, NP Follow up on 11/15/2018.   Specialty:  Pulmonary Disease Why:  Appt at 10:30 AM.  Please arrive at 10:15 for check in.  Contact information: [address] [place] [address] [phone]        Emelia Loron, MD Follow up on 11/19/2018.   Specialty:  General Surgery Why:  11:40am, arrive by 11:10am for paperwork and check in.  please bring photo ID and insurance card Contact information: [address] [place] [address] [phone]           Signed: Barnetta Chapel, [facility] 10/29/2018, 10:35 AM Pager: [phone]

## 2018-11-01 NOTE — Telephone Encounter (Signed)
Thanks. Will close message °

## 2018-11-10 ENCOUNTER — Encounter (HOSPITAL_COMMUNITY): Payer: Medicare Other

## 2018-11-10 ENCOUNTER — Ambulatory Visit: Payer: Medicare Other | Admitting: Family

## 2018-11-11 ENCOUNTER — Ambulatory Visit: Payer: Medicare Other | Admitting: Family

## 2018-11-11 ENCOUNTER — Ambulatory Visit (HOSPITAL_COMMUNITY)
Admission: RE | Admit: 2018-11-11 | Discharge: 2018-11-11 | Disposition: A | Discharge status: Home / Self Care | Payer: Medicare Other | Source: Ambulatory Visit | Attending: Family | Admitting: Family

## 2018-11-11 ENCOUNTER — Encounter: Payer: Self-pay | Admitting: Family

## 2018-11-11 ENCOUNTER — Other Ambulatory Visit: Payer: Self-pay

## 2018-11-11 VITALS — BP 141/60 | HR 66 | Temp 97.8°F | Resp 18 | Ht 66.0 in | Wt 139.0 lb

## 2018-11-11 DIAGNOSIS — I6523 Occlusion and stenosis of bilateral carotid arteries: Secondary | ICD-10-CM

## 2018-11-11 NOTE — Patient Instructions (Signed)

## 2018-11-11 NOTE — Progress Notes (Signed)
Chief Complaint: Follow up Extracranial Carotid Artery Stenosis   History of Present Illness  [name] is a 84 y.o. male whom Dr. Edilia Bo has followed for carotid artery surveillance since his cardiac stent placement and returns today for follow up.  He has not had previous carotid artery intervention.  Patient denies any recollection of PR VEIN BYPASS GRAFT,AORTO-FEM-POP in 2007 at any time (documented in his Past Surgical History), he has no abdominal surgical scar other than right mid abdomen.   He saw Dr. Mayford Knife for chest pain, had cardiac stent placed in 2006 or 2007 and had no further chest pain, denies hx of MI.  About September 2017 he had one ESI which resolved his right sciatic pain.  The patient denies claudication type symptoms with walking.   He denies any history of TIA or stroke symptoms.Specifically he denies a history of amaurosis fugax or monocular blindness, unilateral facial drooping, hemiparesis, or receptive or expressive aphasia.   He had a laparotomy on 10-23-18 for a perforated ulcer, states he is doing well now.   At today's visit he denies chest pain or dyspnea, states abdominal pain after the above surgery is improving daily. He has a g-tube in place that is clamped. He states he is eating well and moving his bowels well.    Diabetic: no Tobacco use: non-smoker, exposed as a child to his father's smoking, x 18 years  Pt meds include: Statin : yes ASA: yes, 81 mg Other anticoagulants/antiplatelets: no   Past Medical History:  Diagnosis Date  . Arthritis    "scattered" (10/26/2018)  . C. difficile diarrhea   . CAD (coronary artery disease) 2005   s/p PCI of RCA  . Carotid artery stenosis    1-39% bilateral by dopplers 10/2016  . Carotid stenosis    "? side; Dr. Edilia Bo"  . Cognitive impairment, mild, so stated   . Colitis   . Erectile dysfunction   . GERD (gastroesophageal reflux disease)   . Hyperlipidemia   . Hypertension   .  Perforated stomach, acute 10/23/2018  . Peripheral vascular disease (HCC)   . Vertebrobasilar artery syndrome   . Vertigo     Social History Social History   Tobacco Use  . Smoking status: Former Smoker    Years: 2.00    Types: Cigars    Last attempt to quit: 05/13/1961    Years since quitting: 57.5  . Smokeless tobacco: Never Used  . Tobacco comment: "never inhaled"  Substance Use Topics  . Alcohol use: Yes    Alcohol/week: 14.0 standard drinks    Types: 14 Glasses of wine per week  . Drug use: Never    Family History Family History  Problem Relation Age of Onset  . Stroke Mother   . Hyperlipidemia Mother   . Hypertension Mother   . Deep vein thrombosis Mother   . Stroke Father   . Hyperlipidemia Father   . Hypertension Father   . Deep vein thrombosis Father   . Diverticulitis Father   . Ulcerative colitis Father   . Colon cancer Neg Hx   . Colon polyps Neg Hx   . Esophageal cancer Neg Hx   . Stomach cancer Neg Hx   . Rectal cancer Neg Hx     Surgical History Past Surgical History:  Procedure Laterality Date  . APPENDECTOMY  1954  . COLONOSCOPY    . ESOPHAGOGASTRODUODENOSCOPY (EGD) WITH ESOPHAGEAL DILATION  09/2018  . EYE SURGERY    . LAPAROTOMY N/A  10/23/2018   Procedure: EXPLORATORY LAPAROTOMY WITH GRAHAM PATCH FOR PERFORATED ULCER AND PLACEMENT OF GASTRIC TUBE;  Surgeon: Emelia LoronWakefield, Matthew, MD;  [facility];  Service: General;  Laterality: N/A;  . NASAL SINUS SURGERY  05/2018  . PR VEIN BYPASS GRAFT,AORTO-FEM-POP  2007  . RETINAL DETACHMENT SURGERY     "? side"    No Known Allergies  Current Outpatient Medications  Medication Sig Dispense Refill  . acetaminophen (TYLENOL) 500 MG tablet Take 500 mg by mouth 2 (two) times daily.    Marland Kitchen. albuterol (PROVENTIL) (2.5 MG/3ML) 0.083% nebulizer solution Take 3 mLs (2.5 mg total) by nebulization every 6 (six) hours as needed for wheezing or shortness of breath. 75 mL 1  . amLODipine (NORVASC) 5 MG tablet  TAKE 1 TABLET BY MOUTH DAILY. PLEASE KEEP UPCOMING APPOINTMENT IN JUNE WITH DOCTOR TURNER FOR FUTURE REFILLS (Patient taking differently: Take 5 mg by mouth daily. ) 90 tablet 2  . aspirin EC 81 MG tablet Take 81 mg by mouth daily.    Marland Kitchen. atorvastatin (LIPITOR) 80 MG tablet TAKE 1 TABLET BY MOUTH DAILY (Patient taking differently: Take 80 mg by mouth daily. ) 90 tablet 3  . carvedilol (COREG) 6.25 MG tablet TAKE 1 TABLET BY MOUTH TWICE DAILY (Patient taking differently: Take 6.25 mg by mouth 2 (two) times daily. ) 180 tablet 2  . cetirizine (ZYRTEC) 10 MG tablet Take 10 mg by mouth daily.    . Diclofenac Sodium 2 % SOLN Place 1 application onto the skin 2 (two) times daily.    Marland Kitchen. donepezil (ARICEPT) 10 MG tablet TAKE 1 TABLET BY MOUTH DAILY (Patient taking differently: Take 10 mg by mouth daily. ) 90 tablet 0  . Eyelid Cleansers (OCUSOFT EYELID CLEANSING) PADS Place 1 application into both eyes 2 (two) times daily.    . fish oil-omega-3 fatty acids 1000 MG capsule Take 2 g by mouth 2 (two) times daily.     . fluticasone (FLONASE) 50 MCG/ACT nasal spray Place 2 sprays into both nostrils daily. (Patient taking differently: Place 2 sprays into both nostrils at bedtime. ) 16 g 6  . gabapentin (NEURONTIN) 100 MG capsule Take 2 capsules (200 mg total) by mouth at bedtime. 180 capsule 0  . hydrochlorothiazide (HYDRODIURIL) 25 MG tablet TAKE 1 TABLET BY MOUTH DAILY (Patient taking differently: Take 25 mg by mouth daily. ) 90 tablet 2  . pantoprazole (PROTONIX) 40 MG tablet Take 1 tablet (40 mg total) by mouth 2 (two) times daily. 60 tablet 1  . Polyvinyl Alcohol-Povidone (REFRESH OP) Place 1 drop into both eyes 2 (two) times daily.    . Saw Palmetto 500 MG CAPS Take 1,000 mg by mouth 2 (two) times daily.     . traMADol (ULTRAM) 50 MG tablet Take 1 tablet (50 mg total) by mouth every 6 (six) hours as needed. 20 tablet 0   No current facility-administered medications for this visit.     Review of Systems :  See HPI for pertinent positives and negatives.  Physical Examination  Vitals:   11/11/18 1102 11/11/18 1105  BP: (!) 145/61 (!) 141/60  Pulse: 66   Resp: 18   Temp: 97.8 F (36.6 C)   TempSrc: Oral   SpO2: 96%   Weight: 139 lb (63 kg)   Height: 5\' 6"  (1.676 m)    Body mass index is 22.44 kg/m.  General: WDWN male in NAD GAIT: normal Eyes: PERRLA HENT: No gross abnormalities.  Pulmonary:  Respirations are non-labored,  good air movement in all fields, CTAB, no rales, rhonchi, or wheezes Cardiac: regular rhythm, no detected murmur.  VASCULAR EXAM Carotid Bruits Right Left   Negative Negative     Abdominal aortic pulse is not palpable.  Radial pulses are 2+ palpable and equal.                                                                                                                            LE Pulses Right Left       FEMORAL  2+ palpable  2+ palpable        POPLITEAL  2+ palpable   2+ palpable       POSTERIOR TIBIAL  2+ palpable   2+ palpable        DORSALIS PEDIS      ANTERIOR TIBIAL 2+ palpable  faintly palpable     Gastrointestinal: soft, no rigidity, mild tenderness to palpation (laparotomy 2 weeks ago for perorated ulcer. Dressing at mid upper abdomen, dry and intact, G-tube clamped), no palpable masses. Musculoskeletal: no muscle atrophy/wasting. M/S 5/5 throughout, extremities without ischemic changes. Skin: No rashes, no ulcers, no cellulitis.   Neurologic:  A&O X 3; appropriate affect, sensation is normal; speech is normal, CN 2-12 intact, pain and light touch intact in extremities, motor exam as listed above. Psychiatric: Normal thought content, mood appropriate to clinical situation.    Assessment: Pranesh Spraggs is a 84 y.o. male  who has no history of stroke or TIA. He does have a history of CAD.  His past surgical history documents that he had PR VEIN BYPASS GRAFT,AORTO-FEM-POP in 2007. However, pt does not recall this.  His pedal pulses are  palpable, he has no claudication sx's with walking; but will obtain ABI's on his return since this is part of his documented surgical hx.   Fortunately he does not have DM and has never used tobacco.  He takes a daily statin and 81 mg ASA.   2 weeks postoperative laparotomy: is feeling better daily, moving his bowels, eating.     DATA   Carotid Duplex (11-11-18): Right ICA: 1-39% stenosis Left ICA: 40-59% stenosis Bilateral vertebral artery flow is antegrade.  Bilateral subclavian artery waveforms are normal.  Increased stenosis in the left ICA compared to the exam on 11-05-16.   Plan:  Follow-up in 1 year with Carotid Duplex scan and ABI's.    I discussed in depth with the patient the nature of atherosclerosis, and emphasized the importance of maximal medical management including strict control of blood pressure, blood glucose, and lipid levels, obtaining regular exercise, and continued cessation of smoking.  The patient is aware that without maximal medical management the underlying atherosclerotic disease process will progress, limiting the benefit of any interventions. The patient was given information about stroke prevention and what symptoms should prompt the patient to seek immediate medical care. Thank you for allowing [place] to participate in this patient's care.  Charisse March, RN, MSN,  FNP-C [facility] Office: [phone]  Clinic Physician: Edilia BoDickson in vein clinic, Randie HeinzCain on call  11/11/18 11:12 AM

## 2018-11-13 ENCOUNTER — Encounter: Payer: Self-pay | Admitting: Family

## 2018-11-15 ENCOUNTER — Ambulatory Visit: Payer: Medicare Other | Admitting: Primary Care

## 2018-11-15 ENCOUNTER — Encounter: Payer: Self-pay | Admitting: Primary Care

## 2018-11-15 VITALS — BP 126/62 | HR 78 | Ht 66.0 in | Wt 135.0 lb

## 2018-11-15 DIAGNOSIS — J310 Chronic rhinitis: Secondary | ICD-10-CM | POA: Insufficient documentation

## 2018-11-15 DIAGNOSIS — J984 Other disorders of lung: Secondary | ICD-10-CM | POA: Diagnosis not present

## 2018-11-15 DIAGNOSIS — R059 Cough, unspecified: Secondary | ICD-10-CM

## 2018-11-15 DIAGNOSIS — R053 Chronic cough: Secondary | ICD-10-CM

## 2018-11-15 DIAGNOSIS — R05 Cough: Secondary | ICD-10-CM

## 2018-11-15 DIAGNOSIS — J45909 Unspecified asthma, uncomplicated: Secondary | ICD-10-CM | POA: Insufficient documentation

## 2018-11-15 LAB — NITRIC OXIDE: Nitric Oxide: 49

## 2018-11-15 MED ORDER — MONTELUKAST SODIUM 10 MG PO TABS: 10.0000 mg | ORAL_TABLET | Freq: Every day | ORAL | 11 refills | Status: DC

## 2018-11-15 MED ORDER — ALBUTEROL SULFATE (2.5 MG/3ML) 0.083% IN NEBU: 2.5000 mg | INHALATION_SOLUTION | Freq: Four times a day (QID) | RESPIRATORY_TRACT | 3 refills | Status: DC | PRN

## 2018-11-15 MED ORDER — SPACER/AERO-HOLDING CHAMBERS DEVI: 0 refills | Status: DC

## 2018-11-15 NOTE — Progress Notes (Signed)
@Patient  ID: [name], male    DOB: [birth year], 84 y.o.   MRN: [id number]  Chief Complaint  Patient presents with  . Hospitalization Follow-up    Doing okay but he is having problems breathing out of his nose.     Referring provider: Sheliah Hatch, MD  HPI: 84 year old male, previous smoker ([place]igars in [place] in 1960 <2 years). PMH significant for chronic cough, CAD, HTN. Recent hospitalization in December 2019 for perforated ulcer. [place] [facility] [facility] patient, consulted in-patient for chronic cough and wheeze. Possible underlying asthma. IgE and Allergy panel negative. CT chest 12/28 showed basal atelectasis versus ILD. Needs HRCT in 6-12 weeks.   11/15/2018 Patient presents today for hospital follow-up. He is feeling well, reports noticeable improvement in cough and wheeze with nebulizer treatments twice daily. No shortness of breath. Coughing was his biggest complaint. Symptoms have slowly returned but states not nearly as bad as they were. FENO 49 today. Has an apt with surgeon on Friday. Afebrile.    Significant testing: Spirometry 11/15/2018 -FVC 2.3 (68%), FEV1 2.0 (87%), ratio 90 FENO 11/15/2018 - 49  IgE 15 Eos absolute 0.1 Resp allergy panel - neg   No Known Allergies  Immunization History  Administered Date(s) Administered  . Influenza Split 08/25/2011, 08/09/2012  . Influenza Whole 07/11/2010  . Influenza, High Dose Seasonal PF 08/14/2014, 07/16/2018  . Influenza,inj,Quad PF,6+ Mos 08/11/2013, 08/18/2014, 07/11/2015, 07/14/2016, 07/20/2017  . Pneumococcal Conjugate-13 01/25/2015  . Pneumococcal Polysaccharide-23 09/15/2002  . Td 12/13/2007  . Zoster 10/29/2010    Past Medical History:  Diagnosis Date  . Arthritis    "scattered" (10/26/2018)  . C. difficile diarrhea   . CAD (coronary artery disease) 2005   s/p PCI of RCA  . Carotid artery stenosis    1-39% bilateral by dopplers 10/2016  . Carotid stenosis    "? side; Dr. Edilia Bo"  . Cognitive  impairment, mild, so stated   . Colitis   . Erectile dysfunction   . GERD (gastroesophageal reflux disease)   . Hyperlipidemia   . Hypertension   . Perforated stomach, acute 10/23/2018  . Peripheral vascular disease (HCC)   . Vertebrobasilar artery syndrome   . Vertigo     Tobacco History: Social History   Tobacco Use  Smoking Status Former Smoker  . Years: 2.00  . Types: Cigars  . Last attempt to quit: 05/13/1961  . Years since quitting: 57.5  Smokeless Tobacco Never Used  Tobacco Comment   "never inhaled"   Counseling given: Not Answered Comment: "never inhaled"   Outpatient Medications Prior to Visit  Medication Sig Dispense Refill  . acetaminophen (TYLENOL) 500 MG tablet Take 500 mg by mouth 2 (two) times daily.    Marland Kitchen amLODipine (NORVASC) 5 MG tablet TAKE 1 TABLET BY MOUTH DAILY. PLEASE KEEP UPCOMING APPOINTMENT IN JUNE WITH DOCTOR TURNER FOR FUTURE REFILLS (Patient taking differently: Take 5 mg by mouth daily. ) 90 tablet 2  . aspirin EC 81 MG tablet Take 81 mg by mouth daily.    Marland Kitchen atorvastatin (LIPITOR) 80 MG tablet TAKE 1 TABLET BY MOUTH DAILY (Patient taking differently: Take 80 mg by mouth daily. ) 90 tablet 3  . carvedilol (COREG) 6.25 MG tablet TAKE 1 TABLET BY MOUTH TWICE DAILY (Patient taking differently: Take 6.25 mg by mouth 2 (two) times daily. ) 180 tablet 2  . cetirizine (ZYRTEC) 10 MG tablet Take 10 mg by mouth daily.    . Diclofenac Sodium 2 % SOLN Place  1 application onto the skin 2 (two) times daily.    Marland Kitchen donepezil (ARICEPT) 10 MG tablet TAKE 1 TABLET BY MOUTH DAILY (Patient taking differently: Take 10 mg by mouth daily. ) 90 tablet 0  . Eyelid Cleansers (OCUSOFT EYELID CLEANSING) PADS Place 1 application into both eyes 2 (two) times daily.    . fish oil-omega-3 fatty acids 1000 MG capsule Take 2 g by mouth 2 (two) times daily.     . fluticasone (FLONASE) 50 MCG/ACT nasal spray Place 2 sprays into both nostrils daily. (Patient taking differently: Place 2  sprays into both nostrils at bedtime. ) 16 g 6  . gabapentin (NEURONTIN) 100 MG capsule Take 2 capsules (200 mg total) by mouth at bedtime. 180 capsule 0  . hydrochlorothiazide (HYDRODIURIL) 25 MG tablet TAKE 1 TABLET BY MOUTH DAILY (Patient taking differently: Take 25 mg by mouth daily. ) 90 tablet 2  . pantoprazole (PROTONIX) 40 MG tablet Take 1 tablet (40 mg total) by mouth 2 (two) times daily. 60 tablet 1  . Polyvinyl Alcohol-Povidone (REFRESH OP) Place 1 drop into both eyes 2 (two) times daily.    . Saw Palmetto 500 MG CAPS Take 1,000 mg by mouth 2 (two) times daily.     . traMADol (ULTRAM) 50 MG tablet Take 1 tablet (50 mg total) by mouth every 6 (six) hours as needed. 20 tablet 0  . albuterol (PROVENTIL) (2.5 MG/3ML) 0.083% nebulizer solution Take 3 mLs (2.5 mg total) by nebulization every 6 (six) hours as needed for wheezing or shortness of breath. 75 mL 1   No facility-administered medications prior to visit.     Review of Systems  Review of Systems  Constitutional: Negative.   HENT: Negative.   Respiratory: Positive for cough and wheezing. Negative for chest tightness and shortness of breath.   Cardiovascular: Negative.     Physical Exam  BP 126/62 (BP Location: Left Arm, Patient Position: Sitting, Cuff Size: Normal)   Pulse 78   Ht 5\' 6"  (1.676 m)   Wt 135 lb (61.2 kg)   SpO2 98%   BMI 21.79 kg/m  Physical Exam Constitutional:      Appearance: Normal appearance.  HENT:     Head: Normocephalic and atraumatic.  Eyes:     Extraocular Movements: Extraocular movements intact.     Pupils: Pupils are equal, round, and reactive to light.  Neck:     Musculoskeletal: Normal range of motion and neck supple.  Cardiovascular:     Rate and Rhythm: Normal rate and regular rhythm.  Pulmonary:     Effort: Pulmonary effort is normal. No respiratory distress.     Breath sounds: No wheezing or rhonchi.     Comments: Fine crackles at bases, no wheezing  Skin:    General: Skin is  warm and dry.  Neurological:     General: No focal deficit present.     Mental Status: He is alert and oriented to person, place, and time. Mental status is at baseline.  Psychiatric:        Mood and Affect: Mood normal.        Behavior: Behavior normal.        Thought Content: Thought content normal.        Judgment: Judgment normal.      Lab Results:  CBC    Component Value Date/Time   WBC 11.8 (H) 10/26/2018 0606   RBC 3.45 (L) 10/26/2018 0606   HGB 9.5 (L) 10/26/2018 0606   HCT  30.1 (L) 10/26/2018 0606   PLT 176 10/26/2018 0606   MCV 87.2 10/26/2018 0606   MCH 27.5 10/26/2018 0606   MCHC 31.6 10/26/2018 0606   RDW 15.3 10/26/2018 0606   LYMPHSABS 1.2 10/23/2018 1344   MONOABS 0.6 10/23/2018 1344   EOSABS 0.1 10/23/2018 1344   BASOSABS 0.0 10/23/2018 1344    BMET    Component Value Date/Time   NA 140 10/26/2018 0606   NA 142 04/16/2018 0948   K 3.5 10/26/2018 0606   CL 106 10/26/2018 0606   CO2 25 10/26/2018 0606   GLUCOSE 85 10/26/2018 0606   GLUCOSE 98 10/04/2010   BUN 12 10/26/2018 0606   BUN 19 04/16/2018 0948   CREATININE 1.18 10/26/2018 0606   CREATININE 0.92 03/31/2016 1051   CALCIUM 7.9 (L) 10/26/2018 0606   GFRNONAA 58 (L) 10/26/2018 0606   GFRAA >60 10/26/2018 0606    BNP No results found for: BNP  ProBNP No results found for: PROBNP  Imaging: Ct Abdomen Pelvis W Contrast  Result Date: 10/23/2018 CLINICAL DATA:  84 year old with acute onset of generalized abdominal pain, nausea and vomiting, and abdominal distention. Surgical history includes appendectomy EXAM: CT ABDOMEN AND PELVIS WITH CONTRAST TECHNIQUE: Multidetector CT imaging of the abdomen and pelvis was performed using the standard protocol following bolus administration of intravenous contrast. CONTRAST:  OMNIPAQUE IOHEXOL 300 MG/ML IV. COMPARISON:  None. FINDINGS: Lower chest: Upper normal heart size. Apparent prior RIGHT coronary artery stenting. No pericardial effusion. 11 mm  mean diameter nodule deep in the RIGHT LOWER LOBE adjacent to the RIGHT hemidiaphragm (series 4, image 5). Minimal dependent atelectasis posteriorly in the lower lobes. Hepatobiliary: Liver normal in size and appearance. Gallbladder normal in appearance without calcified gallstones. No biliary ductal dilation. Pancreas: Normal in appearance without evidence of mass, ductal dilation, or inflammation. Spleen: Normal in size and appearance. Adrenals/Urinary Tract: Normal appearing adrenal glands. Kidneys normal in size and appearance without focal parenchymal abnormality. No hydronephrosis. No evidence of urinary tract calculi. Markedly distended urinary bladder with a large wide-mouth diverticulum arising from the RIGHT superolateral wall at the bladder dome and a small diverticulum arising from the RIGHT posterolateral wall near the base of the bladder. Stomach/Bowel: Marked thickening of the wall of the gastric antrum and pylorus. Stomach otherwise normal in appearance. Small amount of fluid in the distal esophagus without evidence of hiatal hernia. Small diverticulum arising medially from the junction of the second and third portion of the duodenum. Small bowel otherwise normal in appearance. Moderate stool burden in the ascending colon. Scattered sigmoid colon diverticula without evidence of acute diverticulitis. No visible colonic masses. Appendix surgically absent, though what is either a small diverticulum or the appendiceal stump is identified at the cecal tip. Vascular/Lymphatic: Moderate to severe aortoiliofemoral atherosclerosis without evidence of aneurysm. Normal-appearing portal venous and systemic venous systems. No pathologic lymphadenopathy. Reproductive: Markedly enlarged prostate gland containing calcifications. Asymmetric enlargement of the LEFT seminal vesicle. Other: Small amount of free intraperitoneal air as there are numerous extraluminal gas bubbles in the upper abdomen, adjacent to the  stomach and beneath the LEFT hemidiaphragm. Small amount of ascites in the perihepatic region and the RIGHT side of the pelvis. Soft tissue thickening/mass along the LEFT LATERAL peritoneum and possibly to a lesser degree the RIGHT LATERAL peritoneum. Musculoskeletal: Severe degenerative disc disease, spondylosis and facet degenerative changes throughout the lumbar spine. No acute findings. Dystrophic calcifications adjacent to the ischia bilaterally. IMPRESSION: 1. Pneumoperitoneum, likely related to # 2 below.  2. Marked thickening of the wall of the gastric antrum and pylorus, likely indicating gastritis/peptic ulcer disease. 3. Soft tissue thickening/mass along the LEFT LATERAL peritoneum and possibly to a lesser degree the RIGHT LATERAL peritoneum, likely indicating peritoneal carcinomatosis. 4. Marked distention of the urinary bladder with a large wide-mouth diverticulum arising from the RIGHT superolateral wall at the bladder dome and a small RIGHT posterolateral wall diverticulum near the bladder base. 5. Markedly enlarged prostate gland containing calcifications. Asymmetric enlargement of the LEFT seminal vesicle. 6. 11 mm mean diameter nodule deep in the RIGHT LOWER LOBE adjacent to the RIGHT hemidiaphragm. Dedicated CT chest is recommended in further evaluation when the patient has recovered from the current acute emergency. I telephoned these emergent/critical results to Dr. Patria Mane in the emergency department at the time of interpretation on 10/23/2018 at 4:20 p.m. Electronically Signed   By: Hulan Saas M.D.   On: 10/23/2018 16:30   Dg Chest Port 1 View  Result Date: 10/24/2018 CLINICAL DATA:  84 year old male status post intubation. EXAM: PORTABLE CHEST 1 VIEW COMPARISON:  Chest x-ray 10/23/2018. FINDINGS: An endotracheal tube is in place with tip 3.0 cm above the carina. A nasogastric tube is seen extending into the stomach, however, the tip of the nasogastric tube extends below the lower  margin of the image. Lung volumes are low. No consolidative airspace disease. No pleural effusions. Linear opacities throughout the mid to lower lungs bilaterally, similar to the prior study, which could reflect areas of subsegmental atelectasis or scarring. No evidence of pulmonary edema. Heart size is normal. Upper mediastinal contours are within normal limits. IMPRESSION: 1. Support apparatus, as above. 2. Areas of subsegmental atelectasis or scarring in the lung bases bilaterally. 3. Aortic atherosclerosis. Electronically Signed   By: Trudie Reed M.D.   On: 10/24/2018 08:38   Dg Chest Port 1 View  Result Date: 10/23/2018 CLINICAL DATA:  Endotracheal intubation. EXAM: PORTABLE CHEST 1 VIEW COMPARISON:  Radiographs of Mar 10, 2018. FINDINGS: The heart size and mediastinal contours are within normal limits. Endotracheal and nasogastric tubes are in grossly good position. No pneumothorax or pleural effusion is noted. Minimal bibasilar subsegmental atelectasis is noted. The visualized skeletal structures are unremarkable. IMPRESSION: Endotracheal and nasogastric tubes are in grossly good position. Minimal bibasilar subsegmental atelectasis. Aortic Atherosclerosis (ICD10-I70.0). Electronically Signed   By: Lupita Raider, M.D.   On: 10/23/2018 21:00   Dg Kayleen Memos W/water Sol Cm  Result Date: 10/26/2018 CLINICAL DATA:  84 year old male postoperative day 3 from exploratory laparotomy and repair of perforated gastric ulcer. Right upper quadrant surgical drain and Stamm gastrostomy placed intraoperatively. EXAM: UPPER GI SERIES WITH KUB TECHNIQUE: After obtaining a scout radiograph a routine upper GI series was performed using water-soluble contrast FLUOROSCOPY TIME:  Fluoroscopy Time:  2 minutes 18 seconds Radiation Exposure Index (if provided by the fluoroscopic device): Number of Acquired Spot Images: 0 COMPARISON:  Preoperative CT Abdomen and Pelvis 10/23/2018. FINDINGS: AP supine scout view obtained  demonstrating the midline gastrostomy tube and right upper quadrant surgical drain. Small volume of gas within the stomach. Paucity of gas elsewhere, nonobstructed bowel-gas pattern. A single contrast study was undertaken with water-soluble contrast and the patient tolerated this well and without difficulty. The fluoroscopy table was inclined at about 35 degrees. After the patient tolerated the small volume of p.o. water then contrast was administered PO and the stomach was observed fluoroscopically. Initially the gastrostomy tube was unclamped, and contrast entering the stomach preferentially drained through the tube. The  tube was then clamped and multiple additional contrast swallows observed. No obstruction to the flow of contrast through the distal esophagus and into the stomach. The gastroesophageal junction appears normal. Good gastric distention was obtained in there was prompt gastric emptying to the duodenum. The patient was also positioned right lateral oblique. The surgical drain projects along the lesser curve of the stomach. No contrast leak or uptake by the drain occurred over the course of the exam. Satisfactory appearance of the stomach and proximal small bowel. Normal duodenum configuration. Normal ligament of Treitz position. The gastrostomy tube was unclamped at the conclusion of the study and after a 5-10 minute delay 2 post evacuation films were obtained. These demonstrate a small volume of residual contrast in the stomach, duodenum and mostly the proximal jejunum. No contrast leak identified. IMPRESSION: Satisfactory postoperative water-soluble upper GI with no gastric or proximal small bowel leak identified. Electronically Signed   By: Odessa FlemingH  Hall M.D.   On: 10/26/2018 15:59   Vas [place] Carotid  Result Date: 11/11/2018 Carotid Arterial Duplex Study Indications:       Carotid artery disease. Risk Factors:      Hypertension, hyperlipidemia, past history of smoking,                    coronary  artery disease. Comparison Study:  Velocities and stenosis category in the left internal carotid                    artery have increased when compared to previous study on                    11/05/2016. Performing Technologist: [place] [employer]  Examination Guidelines: A complete evaluation includes B-mode imaging, spectral Doppler, color Doppler, and power Doppler as needed of all accessible portions of each vessel. Bilateral testing is considered an integral part of a complete examination. Limited examinations for reoccurring indications may be performed as noted.  Right Carotid Findings: +----------+--------+--------+--------+------------+--------+           PSV cm/sEDV cm/sStenosisDescribe    Comments +----------+--------+--------+--------+------------+--------+ CCA Prox  91      13                                   +----------+--------+--------+--------+------------+--------+ CCA Mid   103     17      <50%    heterogenous         +----------+--------+--------+--------+------------+--------+ CCA Distal96      19      <50%    heterogenous         +----------+--------+--------+--------+------------+--------+ ICA Prox  146     23      1-39%   heterogenous         +----------+--------+--------+--------+------------+--------+ ICA Mid   114     30                                   +----------+--------+--------+--------+------------+--------+ ICA Distal80      19                                   +----------+--------+--------+--------+------------+--------+ ECA       132     11      <50%    heterogenous         +----------+--------+--------+--------+------------+--------+ +----------+--------+-------+----------------+-------------------+  PSV cm/sEDV cmsDescribe        Arm Pressure (mmHG) +----------+--------+-------+----------------+-------------------+ [id number]            Multiphasic, WNL                     +----------+--------+-------+----------------+-------------------+ +---------+--------+--+--------+--+---------+ VertebralPSV cm/s69EDV cm/s12Antegrade +---------+--------+--+--------+--+---------+  Left Carotid Findings: +----------+--------+--------+--------+------------+--------+           PSV cm/sEDV cm/sStenosisDescribe    Comments +----------+--------+--------+--------+------------+--------+ CCA Prox  145     23      <50%    heterogenous         +----------+--------+--------+--------+------------+--------+ CCA Mid   123     26      <50%    heterogenous         +----------+--------+--------+--------+------------+--------+ CCA Distal104     22      <50%    heterogenous         +----------+--------+--------+--------+------------+--------+ ICA Prox  209     56      40-59%  heterogenous         +----------+--------+--------+--------+------------+--------+ ICA Mid   149     35              heterogenous         +----------+--------+--------+--------+------------+--------+ ICA Distal129     37                                   +----------+--------+--------+--------+------------+--------+ ECA       120             <50%    heterogenous         +----------+--------+--------+--------+------------+--------+ +----------+--------+--------+----------------+-------------------+ SubclavianPSV cm/sEDV cm/sDescribe        Arm Pressure (mmHG) +----------+--------+--------+----------------+-------------------+           144             Multiphasic, WNL                    +----------+--------+--------+----------------+-------------------+ +---------+--------+--+--------+--+---------+ VertebralPSV cm/s57EDV cm/s11Antegrade +---------+--------+--+--------+--+---------+  Summary: Right Carotid: Velocities in the right ICA are consistent with a 1-39% stenosis.                Non-hemodynamically significant plaque <50% noted in the CCA. The                 ECA appears <50% stenosed. Left Carotid: Velocities in the left ICA are consistent with a 40-59% stenosis.               Non-hemodynamically significant plaque noted in the CCA. The ECA               appears <50% stenosed. Vertebrals:  Bilateral vertebral arteries demonstrate antegrade flow. Subclavians: Normal flow hemodynamics were seen in bilateral subclavian              arteries. *See table(s) above for measurements and observations.  Electronically signed by Waverly Ferrari MD on 11/11/2018 at 11:47:03 AM.    Final      Assessment & Plan:   Restrictive lung disease - Spirometry today showed mild restriction, no obstruction - Needs HRCT in 6-12 weeks   Chronic cough - Likely d/t asthma  - IgE, eos and resp allergy panel normal  - FENO 49 (elevated) - Start Asmanex 2 puffs twice daily - Continue prn albuterol nebulizer every 4-6 hours for sob/wheeze   Rhinitis, chronic - Continue Zyrtec and  Flonase daily - Resume nasal sinus rinses 1-2 times a day as needed  - Add Singulair at bedtime    Glenford Bayley, NP 11/15/2018

## 2018-11-15 NOTE — Assessment & Plan Note (Signed)
-   Spirometry today showed mild restriction, no obstruction - Needs HRCT in 6-12 weeks

## 2018-11-15 NOTE — Assessment & Plan Note (Signed)
-   Continue Zyrtec and Flonase daily - Resume nasal sinus rinses 1-2 times a day as needed  - Add Singulair at bedtime

## 2018-11-15 NOTE — Patient Instructions (Signed)
Testing indicative of mild asthma  Office testing: - FENO 49 (elevated) - Spirometry showed mild restriction, no obstruction.  Recommendations: - Continue Flonase and zyrtec - Use nasal rinses 1-2 times a day   Orders: - HRCT in 6-12 weeks re: abnormal CT, cough, restrictive lung disease   RX: - Refilling Albuterol nebulizer, take 1 neb every 4-6 hours as needed for shortness or breath/wheezing  - Asmanex 100 - take two puffs twice a day  - Singulair at bedtime   Follow-up: 2-4 weeks with Waynetta SandyBeth NP    Asthma, Adult  Asthma is a long-term (chronic) condition in which the airways get tight and narrow. The airways are the breathing passages that lead from the nose and mouth down into the lungs. A person with asthma will have times when symptoms get worse. These are called asthma attacks. They can cause coughing, whistling sounds when you breathe (wheezing), shortness of breath, and chest pain. They can make it hard to breathe. There is no cure for asthma, but medicines and lifestyle changes can help control it. There are many things that can bring on an asthma attack or make asthma symptoms worse (triggers). Common triggers include:  Mold.  Dust.  Cigarette smoke.  Cockroaches.  Things that can cause allergy symptoms (allergens). These include animal skin flakes (dander) and pollen from trees or grass.  Things that pollute the air. These may include household cleaners, wood smoke, smog, or chemical odors.  Cold air, weather changes, and wind.  Crying or laughing hard.  Stress.  Certain medicines or drugs.  Certain foods such as dried fruit, potato chips, and grape juice.  Infections, such as a cold or the flu.  Certain medical conditions or diseases.  Exercise or tiring activities. Asthma may be treated with medicines and by staying away from the things that cause asthma attacks. Types of medicines may include:  Controller medicines. These help prevent asthma  symptoms. They are usually taken every day.  Fast-acting reliever or rescue medicines. These quickly relieve asthma symptoms. They are used as needed and provide short-term relief.  Allergy medicines if your attacks are brought on by allergens.  Medicines to help control the body's defense (immune) system. Follow these instructions at home: Avoiding triggers in your home  Change your heating and air conditioning filter often.  Limit your use of fireplaces and wood stoves.  Get rid of pests (such as roaches and mice) and their droppings.  Throw away plants if you see mold on them.  Clean your floors. Dust regularly. Use cleaning products that do not smell.  Have someone vacuum when you are not home. Use a vacuum cleaner with a HEPA filter if possible.  Replace carpet with wood, tile, or vinyl flooring. Carpet can trap animal skin flakes and dust.  Use allergy-proof pillows, mattress covers, and box spring covers.  Wash bed sheets and blankets every week in hot water. Dry them in a dryer.  Keep your bedroom free of any triggers.  Avoid pets and keep windows closed when things that cause allergy symptoms are in the air.  Use blankets that are made of polyester or cotton.  Clean bathrooms and kitchens with bleach. If possible, have someone repaint the walls in these rooms with mold-resistant paint. Keep out of the rooms that are being cleaned and painted.  Wash your hands often with soap and water. If soap and water are not available, use hand sanitizer.  Do not allow anyone to smoke in your home. General  instructions  Take over-the-counter and prescription medicines only as told by your doctor. ? Talk with your doctor if you have questions about how or when to take your medicines. ? Make note if you need to use your medicines more often than usual.  Do not use any products that contain nicotine or tobacco, such as cigarettes and e-cigarettes. If you need help quitting, ask  your doctor.  Stay away from secondhand smoke.  Avoid doing things outdoors when allergen counts are high and when air quality is low.  Wear a ski mask when doing outdoor activities in the winter. The mask should cover your nose and mouth. Exercise indoors on cold days if you can.  Warm up before you exercise. Take time to cool down after exercise.  Use a peak flow meter as told by your doctor. A peak flow meter is a tool that measures how well the lungs are working.  Keep track of the peak flow meter's readings. Write them down.  Follow your asthma action plan. This is a written plan for taking care of your asthma and treating your attacks.  Make sure you get all the shots (vaccines) that your doctor recommends. Ask your doctor about a flu shot and a pneumonia shot.  Keep all follow-up visits as told by your doctor. This is important. Contact a doctor if:  You have wheezing, shortness of breath, or a cough even while taking medicine to prevent attacks.  The mucus you cough up (sputum) is thicker than usual.  The mucus you cough up changes from clear or white to yellow, green, gray, or bloody.  You have problems from the medicine you are taking, such as: ? A rash. ? Itching. ? Swelling. ? Trouble breathing.  You need reliever medicines more than 2-3 times a week.  Your peak flow reading is still at 50-79% of your personal best after following the action plan for 1 hour.  You have a fever. Get help right away if:  You seem to be worse and are not responding to medicine during an asthma attack.  You are short of breath even at rest.  You get short of breath when doing very little activity.  You have trouble eating, drinking, or talking.  You have chest pain or tightness.  You have a fast heartbeat.  Your lips or fingernails start to turn blue.  You are light-headed or dizzy, or you faint.  Your peak flow is less than 50% of your personal best.  You feel too  tired to breathe normally. Summary  Asthma is a long-term (chronic) condition in which the airways get tight and narrow. An asthma attack can make it hard to breathe.  Asthma cannot be cured, but medicines and lifestyle changes can help control it.  Make sure you understand how to avoid triggers and how and when to use your medicines. This information is not intended to replace advice given to you by your health care provider. Make sure you discuss any questions you have with your health care provider. Document Released: 03/31/2008 Document Revised: 11/17/2016 Document Reviewed: 11/17/2016 Elsevier Interactive Patient Education  2019 [employer]

## 2018-11-15 NOTE — Assessment & Plan Note (Addendum)
-   Likely d/t asthma  - IgE, eos and resp allergy panel normal  - FENO 49 (elevated) - Start Asmanex 2 puffs twice daily - Continue prn albuterol nebulizer every 4-6 hours for sob/wheeze

## 2018-12-03 NOTE — Progress Notes (Signed)
@Patient  ID: [name], male    DOB: 06-04-1935, 84 y.o.   MRN: [id number]  Chief Complaint  Patient presents with  . Follow-up    flonase has not helped sinus congestion/stuffy nose.  pt also notes cough worse qhs- has worsened X1 wk.      Referring provider: Sheliah Hatch, MD  HPI: 84 year old male, previous smoker (Cigars in [place] in 1960 <2 years). PMH significant for chronic cough, CAD, HTN. Recent hospitalization in December 2019 for perforated ulcer. [facility], consulted in-patient for chronic cough and wheeze. Possible underlying asthma. IgE and Allergy panel negative. CT chest 12/28 showed basal atelectasis versus ILD. Needs HRCT in 6-12 weeks.   11/15/2018 Patient presents today for hospital follow-up. He is feeling well, reports noticeable improvement in cough and wheeze with nebulizer treatments twice daily. No shortness of breath. Coughing was his biggest complaint. Symptoms have slowly returned but states not nearly as bad as they were. FENO 49 today. Started on Asmanex 2 puffs twice daily and Singulair at bedtime. HRCT planned for March.   12/06/2018 Patient presents today for 2-3 week follow up. Accompanied by wife. Complains of nasal congestion/post nasal drip with yellow mucus. Associated cough and occasional wheezing. Symptoms are worse at night. Continues using Asmanex twice daily, flonase nasal spray and singulair. Needs prn albuterol during the afternoon, reports benefit but only last a few hours. Wife states cough was so much better but returned this week. Denies shortness of breath. HRCT planned for March.    Significant testing: FENO 12/06/2018 - 37 (49) Spirometry 11/15/2018 -FVC 2.3 (68%), FEV1 2.0 (87%), ratio 90 Labs: IgE 15; Eos absolute 0.1; Resp allergy panel - neg  No Known Allergies  Immunization History  Administered Date(s) Administered  . Influenza Split 08/25/2011, 08/09/2012  . Influenza Whole 07/11/2010  .  Influenza, High Dose Seasonal PF 08/14/2014, 07/16/2018  . Influenza,inj,Quad PF,6+ Mos 08/11/2013, 08/18/2014, 07/11/2015, 07/14/2016, 07/20/2017  . Pneumococcal Conjugate-13 01/25/2015  . Pneumococcal Polysaccharide-23 09/15/2002  . Td 12/13/2007  . Zoster 10/29/2010    Past Medical History:  Diagnosis Date  . Arthritis    "scattered" (10/26/2018)  . C. difficile diarrhea   . CAD (coronary artery disease) 2005   s/p PCI of RCA  . Carotid artery stenosis    1-39% bilateral by dopplers 10/2016  . Carotid stenosis    "? side; Dr. Edilia Bo"  . Cognitive impairment, mild, so stated   . Colitis   . Erectile dysfunction   . GERD (gastroesophageal reflux disease)   . Hyperlipidemia   . Hypertension   . Perforated stomach, acute 10/23/2018  . Peripheral vascular disease (HCC)   . Vertebrobasilar artery syndrome   . Vertigo     Tobacco History: Social History   Tobacco Use  Smoking Status Former Smoker  . Years: 2.00  . Types: Cigars  . Last attempt to quit: 05/13/1961  . Years since quitting: 57.6  Smokeless Tobacco Never Used  Tobacco Comment   "never inhaled"   Counseling given: Not Answered Comment: "never inhaled"   Outpatient Medications Prior to Visit  Medication Sig Dispense Refill  . acetaminophen (TYLENOL) 500 MG tablet Take 500 mg by mouth 2 (two) times daily.    Marland Kitchen albuterol (PROVENTIL) (2.5 MG/3ML) 0.083% nebulizer solution Take 3 mLs (2.5 mg total) by nebulization every 6 (six) hours as needed for wheezing or shortness of breath. 90 vial 3  . amLODipine (NORVASC) 5 MG tablet TAKE 1 TABLET BY  MOUTH DAILY. PLEASE KEEP UPCOMING APPOINTMENT IN JUNE WITH DOCTOR TURNER FOR FUTURE REFILLS (Patient taking differently: Take 5 mg by mouth daily. ) 90 tablet 2  . aspirin EC 81 MG tablet Take 81 mg by mouth daily.    Marland Kitchen. atorvastatin (LIPITOR) 80 MG tablet TAKE 1 TABLET BY MOUTH DAILY (Patient taking differently: Take 80 mg by mouth daily. ) 90 tablet 3  . carvedilol  (COREG) 6.25 MG tablet TAKE 1 TABLET BY MOUTH TWICE DAILY (Patient taking differently: Take 6.25 mg by mouth 2 (two) times daily. ) 180 tablet 2  . cetirizine (ZYRTEC) 10 MG tablet Take 10 mg by mouth daily.    . Diclofenac Sodium 2 % SOLN Place 1 application onto the skin 2 (two) times daily.    Marland Kitchen. donepezil (ARICEPT) 10 MG tablet TAKE 1 TABLET BY MOUTH DAILY (Patient taking differently: Take 10 mg by mouth daily. ) 90 tablet 0  . Eyelid Cleansers (OCUSOFT EYELID CLEANSING) PADS Place 1 application into both eyes 2 (two) times daily.    . fish oil-omega-3 fatty acids 1000 MG capsule Take 2 g by mouth 2 (two) times daily.     . fluticasone (FLONASE) 50 MCG/ACT nasal spray Place 2 sprays into both nostrils daily. (Patient taking differently: Place 2 sprays into both nostrils at bedtime. ) 16 g 6  . gabapentin (NEURONTIN) 100 MG capsule Take 2 capsules (200 mg total) by mouth at bedtime. 180 capsule 0  . hydrochlorothiazide (HYDRODIURIL) 25 MG tablet TAKE 1 TABLET BY MOUTH DAILY (Patient taking differently: Take 25 mg by mouth daily. ) 90 tablet 2  . montelukast (SINGULAIR) 10 MG tablet Take 1 tablet (10 mg total) by mouth at bedtime. 30 tablet 11  . Polyvinyl Alcohol-Povidone (REFRESH OP) Place 1 drop into both eyes 2 (two) times daily.    . Saw Palmetto 500 MG CAPS Take 1,000 mg by mouth 2 (two) times daily.     Marland Kitchen. Spacer/Aero-Holding [facility] DEVI Use as directed. 1 each 0  . traMADol (ULTRAM) 50 MG tablet Take 1 tablet (50 mg total) by mouth every 6 (six) hours as needed. 20 tablet 0  . pantoprazole (PROTONIX) 40 MG tablet Take 1 tablet (40 mg total) by mouth 2 (two) times daily. 60 tablet 1   No facility-administered medications prior to visit.     Review of Systems  Review of Systems  Constitutional: Negative.   HENT: Positive for postnasal drip.   Respiratory: Positive for cough and wheezing. Negative for shortness of breath.   Cardiovascular: Negative.     Physical Exam  BP 114/62  (BP Location: Right Arm, Cuff Size: Normal)   Pulse 69   Ht 5\' 6"  (1.676 m)   Wt 137 lb (62.1 kg)   SpO2 95%   BMI 22.11 kg/m  Physical Exam Constitutional:      Appearance: He is well-developed.  HENT:     Head: Normocephalic and atraumatic.  Eyes:     Pupils: Pupils are equal, round, and reactive to light.  Neck:     Musculoskeletal: Normal range of motion and neck supple.  Cardiovascular:     Rate and Rhythm: Normal rate and regular rhythm.     Heart sounds: Normal heart sounds.  Pulmonary:     Effort: Pulmonary effort is normal. No respiratory distress.     Breath sounds: Normal breath sounds. No wheezing or rhonchi.     Comments: Faint crackles right base  Abdominal:     General: Bowel sounds  are normal.     Palpations: Abdomen is soft.     Tenderness: There is no abdominal tenderness.  Skin:    General: Skin is warm and dry.     Findings: No erythema or rash.  Neurological:     Mental Status: He is alert and oriented to person, place, and time.  Psychiatric:        Behavior: Behavior normal.        Judgment: Judgment normal.      Lab Results:  CBC    Component Value Date/Time   WBC 11.8 (H) 10/26/2018 0606   RBC 3.45 (L) 10/26/2018 0606   HGB 9.5 (L) 10/26/2018 0606   HCT 30.1 (L) 10/26/2018 0606   PLT 176 10/26/2018 0606   MCV 87.2 10/26/2018 0606   MCH 27.5 10/26/2018 0606   MCHC 31.6 10/26/2018 0606   RDW 15.3 10/26/2018 0606   LYMPHSABS 1.2 10/23/2018 1344   MONOABS 0.6 10/23/2018 1344   EOSABS 0.1 10/23/2018 1344   BASOSABS 0.0 10/23/2018 1344    BMET    Component Value Date/Time   NA 140 10/26/2018 0606   NA 142 04/16/2018 0948   K 3.5 10/26/2018 0606   CL 106 10/26/2018 0606   CO2 25 10/26/2018 0606   GLUCOSE 85 10/26/2018 0606   GLUCOSE 98 10/04/2010   BUN 12 10/26/2018 0606   BUN 19 04/16/2018 0948   CREATININE 1.18 10/26/2018 0606   CREATININE 0.92 03/31/2016 1051   CALCIUM 7.9 (L) 10/26/2018 0606   GFRNONAA 58 (L) 10/26/2018  0606   GFRAA >60 10/26/2018 0606    BNP No results found for: BNP  ProBNP No results found for: PROBNP  Imaging: Vas US Carotid  Result Date: 11/11/2018 Carotid Arterial Duplex Study Indications:       Carotid artery disease. Risk Factors:      Hypertension, hyperlipidemia, past history of smoking,                    coronary artery disease. Comparison Study:  Velocities and stenosis category in the left internal carotid                    artery have increased when compared to previous study on                    11/05/2016. Performing Technologist: [place] Sizer RVT  Examination Guidelines: A complete evaluation includes B-mode imaging, spectral Doppler, color Doppler, and power Doppler as needed of all accessible portions of each vessel. Bilateral testing is considered an integral part of a complete examination. Limited examinations for reoccurring indications may be performed as noted.  Right Carotid Findings: +----------+--------+--------+--------+------------+--------+           PSV cm/sEDV cm/sStenosisDescribe    Comments +----------+--------+--------+--------+------------+--------+ CCA Prox  91      13                                   +----------+--------+--------+--------+------------+--------+ CCA Mid   103     17      <50%    heterogenous         +----------+--------+--------+--------+------------+--------+ CCA Distal96      19      <50%    heterogenous         +----------+--------+--------+--------+------------+--------+ ICA Prox  146     23      1-39%   heterogenous         +----------+--------+--------+--------+------------+--------+  ICA Mid   114     30                                   +----------+--------+--------+--------+------------+--------+ ICA Distal80      19                                   +----------+--------+--------+--------+------------+--------+ ECA       132     11      <50%    heterogenous          +----------+--------+--------+--------+------------+--------+ +----------+--------+-------+----------------+-------------------+           PSV cm/sEDV cmsDescribe        Arm Pressure (mmHG) +----------+--------+-------+----------------+-------------------+ [id number]            Multiphasic, WNL                    +----------+--------+-------+----------------+-------------------+ +---------+--------+--+--------+--+---------+ VertebralPSV cm/s69EDV cm/s12Antegrade +---------+--------+--+--------+--+---------+  Left Carotid Findings: +----------+--------+--------+--------+------------+--------+           PSV cm/sEDV cm/sStenosisDescribe    Comments +----------+--------+--------+--------+------------+--------+ CCA Prox  145     23      <50%    heterogenous         +----------+--------+--------+--------+------------+--------+ CCA Mid   123     26      <50%    heterogenous         +----------+--------+--------+--------+------------+--------+ CCA Distal104     22      <50%    heterogenous         +----------+--------+--------+--------+------------+--------+ ICA Prox  209     56      40-59%  heterogenous         +----------+--------+--------+--------+------------+--------+ ICA Mid   149     35              heterogenous         +----------+--------+--------+--------+------------+--------+ ICA Distal129     37                                   +----------+--------+--------+--------+------------+--------+ ECA       120             <50%    heterogenous         +----------+--------+--------+--------+------------+--------+ +----------+--------+--------+----------------+-------------------+ SubclavianPSV cm/sEDV cm/sDescribe        Arm Pressure (mmHG) +----------+--------+--------+----------------+-------------------+           144             Multiphasic, WNL                     +----------+--------+--------+----------------+-------------------+ +---------+--------+--+--------+--+---------+ VertebralPSV cm/s57EDV cm/s11Antegrade +---------+--------+--+--------+--+---------+  Summary: Right Carotid: Velocities in the right ICA are consistent with a 1-39% stenosis.                Non-hemodynamically significant plaque <50% noted in the CCA. The                ECA appears <50% stenosed. Left Carotid: Velocities in the left ICA are consistent with a 40-59% stenosis.               Non-hemodynamically significant plaque noted in the CCA. The ECA  appears <50% stenosed. Vertebrals:  Bilateral vertebral arteries demonstrate antegrade flow. Subclavians: Normal flow hemodynamics were seen in bilateral subclavian              arteries. *See table(s) above for measurements and observations.  Electronically signed by Waverly Ferrarihristopher Dickson MD on 11/11/2018 at 11:47:03 AM.    Final      Assessment & Plan:   Restrictive lung disease - Chief complaint of cough and associated wheezing  - Spirometry in Jan showed mild restriction - Repeat FENO 37 (49) - Plan change Asmanex to Breo 1 puff daily; prn albuterol  - Needs full PFTs with DLCO  - HRCT scheduled for 3/2, ? ILD  - FU with Dr. Marchelle Gearingamaswamy   Rhinitis, chronic - PND symptoms likely contributing to cough  - Plan change Flonase to Dymista nasal spray (azelastine-fluticasone) - Continue Singulair and nasal irrigation    Glenford BayleyElizabeth W Walsh, NP 12/06/2018

## 2018-12-06 ENCOUNTER — Encounter: Payer: Self-pay | Admitting: Primary Care

## 2018-12-06 ENCOUNTER — Ambulatory Visit: Payer: Medicare Other | Admitting: Primary Care

## 2018-12-06 VITALS — BP 114/62 | HR 69 | Ht 66.0 in | Wt 137.0 lb

## 2018-12-06 DIAGNOSIS — J310 Chronic rhinitis: Secondary | ICD-10-CM

## 2018-12-06 DIAGNOSIS — R05 Cough: Secondary | ICD-10-CM

## 2018-12-06 DIAGNOSIS — R059 Cough, unspecified: Secondary | ICD-10-CM

## 2018-12-06 DIAGNOSIS — J984 Other disorders of lung: Secondary | ICD-10-CM

## 2018-12-06 LAB — NITRIC OXIDE: Nitric Oxide: 37

## 2018-12-06 MED ORDER — FLUTICASONE FUROATE-VILANTEROL 100-25 MCG/INH IN AEPB: 1.0000 | INHALATION_SPRAY | Freq: Every day | RESPIRATORY_TRACT | 0 refills | Status: DC

## 2018-12-06 MED ORDER — AZELASTINE-FLUTICASONE 137-50 MCG/ACT NA SUSP: 1.0000 | Freq: Two times a day (BID) | NASAL | 3 refills | Status: DC

## 2018-12-06 MED ORDER — PANTOPRAZOLE SODIUM 40 MG PO TBEC: 40.0000 mg | DELAYED_RELEASE_TABLET | Freq: Two times a day (BID) | ORAL | 6 refills | Status: DC

## 2018-12-06 NOTE — Patient Instructions (Addendum)
RX: Dymista nasal spray 1 puff per nostril twice daily (replaces flonase) Breo 1 puff into lungs daily (replaces Asmanex) Protonix refilled  Recommendations: Continue Singulair daily Continue nasal irrigation daily  Continue Albuterol every 4-6 hours as needed for shortness of breath/wheezing   Office testing: FENO 37 (49 prior)  Follow-up:  Dr. Marchelle Gearing in March/ April with full PFTs prior

## 2018-12-06 NOTE — Assessment & Plan Note (Signed)
-   PND symptoms likely contributing to cough  - Plan change Flonase to Dymista nasal spray (azelastine-fluticasone) - Continue Singulair and nasal irrigation

## 2018-12-06 NOTE — Assessment & Plan Note (Addendum)
-   Chief complaint of cough and associated wheezing  - Spirometry in Jan showed mild restriction - Repeat FENO 37 (49) - Plan change Asmanex to Breo 1 puff daily; prn albuterol  - Needs full PFTs with DLCO  - HRCT scheduled for 3/2, ? ILD  - FU with Dr. Marchelle Gearing

## 2018-12-11 ENCOUNTER — Other Ambulatory Visit: Payer: Self-pay | Admitting: Family Medicine

## 2018-12-13 ENCOUNTER — Telehealth: Payer: Self-pay | Admitting: Internal Medicine

## 2018-12-13 MED ORDER — AZELASTINE HCL 0.1 % NA SOLN: 1.0000 | Freq: Two times a day (BID) | NASAL | 5 refills | Status: DC

## 2018-12-13 MED ORDER — FLUTICASONE PROPIONATE 50 MCG/ACT NA SUSP: NASAL | 5 refills | Status: DC

## 2018-12-13 NOTE — Telephone Encounter (Signed)
Spoke with the pt's spouse and notified ok for flonase and astelin  The rxs were sent

## 2018-12-13 NOTE — Telephone Encounter (Signed)
Yes this is okay.   Please place the order.   Arlys John

## 2018-12-13 NOTE — Telephone Encounter (Signed)
Call made to patient wife, she states dymista is too expensive and wanted to know if the patient can have a prescription for Azelastine to take with the Flonase he already has at home. She states the dymista works really well but its just too expensive.   BM please advise as MR is out of the office. Thanks.

## 2018-12-14 ENCOUNTER — Telehealth: Payer: Self-pay | Admitting: Internal Medicine

## 2018-12-14 NOTE — Telephone Encounter (Signed)
DrLeone Payor have you had any conversations about this?

## 2018-12-14 NOTE — Telephone Encounter (Signed)
No conversation but from chart review I can see he needs an EGD and am ok with direct EGD  S/p perforated pyloric ulcer and repair

## 2018-12-14 NOTE — Telephone Encounter (Signed)
Patient notified  EGD and pre-visit scheduled with his wife.

## 2018-12-14 NOTE — Telephone Encounter (Signed)
Pt's wife Bonita Quin calling to inform that pt had abdominal surgery with Dr. Dwain Sarna and was d/c on 1/31. Dr. Dwain Sarna wants pt to have a procedure to check for healing. Mrs. Berkey wants to know if Dr. Dwain Sarna has spoken with Dr. Leone Payor about it.

## 2018-12-21 ENCOUNTER — Encounter: Payer: Self-pay | Admitting: Internal Medicine

## 2018-12-21 ENCOUNTER — Ambulatory Visit (AMBULATORY_SURGERY_CENTER): Payer: Self-pay | Admitting: *Deleted

## 2018-12-21 ENCOUNTER — Other Ambulatory Visit: Payer: Self-pay | Admitting: Family Medicine

## 2018-12-21 VITALS — Ht 66.0 in | Wt 136.8 lb

## 2018-12-21 DIAGNOSIS — K279 Peptic ulcer, site unspecified, unspecified as acute or chronic, without hemorrhage or perforation: Secondary | ICD-10-CM

## 2018-12-21 NOTE — Progress Notes (Signed)
No egg or soy allergy known to patient  No issues with past sedation with any surgeries  or procedures, no intubation problems  No diet pills per patient No home 02 use per patient  No blood thinners per patient  Pt denies issues with constipation  No A fib or A flutter  EMMI video sent to pt's e mail  

## 2018-12-27 ENCOUNTER — Ambulatory Visit (INDEPENDENT_AMBULATORY_CARE_PROVIDER_SITE_OTHER)
Admission: RE | Admit: 2018-12-27 | Discharge: 2018-12-27 | Disposition: A | Discharge status: Home / Self Care | Payer: Medicare Other | Source: Ambulatory Visit | Attending: Primary Care | Admitting: Primary Care

## 2018-12-27 DIAGNOSIS — R918 Other nonspecific abnormal finding of lung field: Secondary | ICD-10-CM | POA: Diagnosis not present

## 2018-12-27 DIAGNOSIS — R059 Cough, unspecified: Secondary | ICD-10-CM

## 2018-12-27 DIAGNOSIS — R05 Cough: Secondary | ICD-10-CM

## 2018-12-28 ENCOUNTER — Encounter: Payer: Self-pay | Admitting: Internal Medicine

## 2018-12-28 ENCOUNTER — Other Ambulatory Visit: Payer: Self-pay

## 2018-12-28 ENCOUNTER — Telehealth: Payer: Self-pay | Admitting: Internal Medicine

## 2018-12-28 ENCOUNTER — Ambulatory Visit: Payer: Medicare Other | Admitting: Internal Medicine

## 2018-12-28 VITALS — BP 149/87 | HR 82 | Temp 97.1°F | Ht 66.0 in | Wt 130.0 lb

## 2018-12-28 DIAGNOSIS — J181 Lobar pneumonia, unspecified organism: Principal | ICD-10-CM

## 2018-12-28 DIAGNOSIS — J189 Pneumonia, unspecified organism: Secondary | ICD-10-CM

## 2018-12-28 MED ORDER — SODIUM CHLORIDE 0.9 % IV SOLN: 500.0000 mL | Freq: Once | INTRAVENOUS | Status: DC

## 2018-12-28 MED ORDER — LEVOFLOXACIN 500 MG PO TABS: 500.0000 mg | ORAL_TABLET | Freq: Every day | ORAL | 0 refills | Status: DC

## 2018-12-28 NOTE — Telephone Encounter (Signed)
Spoke with patient and his wife. Patient has productive cough. Breathing is ok. Needs sputum culture and offered patient an apt with me tomorrow at noon (please book). Will send in levaquin.

## 2018-12-28 NOTE — Telephone Encounter (Signed)
Spoke with pt [name], she states the pt has pneumonia and was sent to have a CT scan. They are wanting the results of the CT because he was getting a scope by Dr. Leone Payor and they couldn't scope him due to the results of the CT scan plus he was coughing really bad at his office. He advised them to call Dr.Ramaswamy because it looks like he has pneumonia still in his right lung. MR please advise.

## 2018-12-28 NOTE — Telephone Encounter (Signed)
Spoke with [name] who spoke with pt after a call was received. Pt called and spoke with pt who stated that he was not feeling well and due to that, pt is coming in tomorrow 12/29/2018 to see [name] and Waynetta Sandy has also ordered a sputum culture and also levaquin was ordered by her as well.

## 2018-12-28 NOTE — Progress Notes (Signed)
Cancelled EGD - he has RLLL air space disease on Chest CT Spoke to Dr. Marchelle Gearing - he will contact patient re: f/u and Tx

## 2018-12-28 NOTE — Telephone Encounter (Signed)
Got callf from Dr Leone Payor - patient [name] showed up for endoscopy just now but his CT from yestrday shows this below. So, Dr Leone Payor cancelled bronch.   Plan - emily please call patient and if hhe is stable keep appt 01/12/2019 with pft - if not, get him in this week to see me (last visit with NP); 30 min slot preferred but 15 min ok    Ct Chest High Resolution  Result Date: 12/27/2018 CLINICAL DATA:  Persistent cough EXAM: CT CHEST WITHOUT CONTRAST TECHNIQUE: Multidetector CT imaging of the chest was performed following the standard protocol without intravenous contrast. High resolution imaging of the lungs, as well as inspiratory and expiratory imaging, was performed. COMPARISON:  Chest radiograph, 10/24/2018, CT chest, 11/04/2004 FINDINGS: Cardiovascular: Three-vessel coronary artery calcifications and right coronary artery stent. Normal heart size. No pericardial effusion. Mediastinum/Nodes: No enlarged mediastinal, hilar, or axillary lymph nodes. Thyroid gland, trachea, and esophagus demonstrate no significant findings. Lungs/Pleura: There is extensive clustered centrilobular and tree-in-bud nodular opacity of the right lung, with consolidation of the dependent right lung base. Bronchial wall thickening of the right lower lobe. No pleural effusion or pneumothorax. Upper Abdomen: No acute abnormality. Musculoskeletal: No chest wall mass or suspicious bone lesions identified. IMPRESSION: 1. There is extensive clustered centrilobular and tree-in-bud nodular opacity of the right lung, with consolidation of the dependent right lung base. Bronchial wall thickening of the right lower lobe. Findings are consistent with multifocal infection, and specifically aspiration given distribution. 2. No evidence of fibrotic interstitial lung disease. No air trapping. 3.  Coronary artery disease and right coronary artery stent. Electronically Signed   By: Lauralyn Primes M.D.   On: 12/27/2018 11:41

## 2018-12-28 NOTE — Progress Notes (Signed)
Pt still with congested cough that he reports has not improved. Sats 93% chest CT results from yesterday in Epic. SM

## 2018-12-28 NOTE — Telephone Encounter (Signed)
No problem, will discuss with you tomorrow when I see him at noon. I did check, he had an EKG in December 2019 that showed his QTC <438ms.

## 2018-12-28 NOTE — Telephone Encounter (Signed)
[name]  Please staff with me after seeing patient. Some general caveats we can discuss tomorrow - with quinolones - QTc and tendon safety.

## 2018-12-29 ENCOUNTER — Encounter: Payer: Self-pay | Admitting: Primary Care

## 2018-12-29 ENCOUNTER — Ambulatory Visit (INDEPENDENT_AMBULATORY_CARE_PROVIDER_SITE_OTHER): Payer: Medicare Other | Admitting: Primary Care

## 2018-12-29 ENCOUNTER — Other Ambulatory Visit: Payer: Medicare Other

## 2018-12-29 VITALS — BP 120/60 | HR 77 | Temp 98.7°F | Ht 66.0 in | Wt 133.0 lb

## 2018-12-29 DIAGNOSIS — J181 Lobar pneumonia, unspecified organism: Principal | ICD-10-CM

## 2018-12-29 DIAGNOSIS — J189 Pneumonia, unspecified organism: Secondary | ICD-10-CM | POA: Insufficient documentation

## 2018-12-29 LAB — BASIC METABOLIC PANEL
BUN: 29 mg/dL — ABNORMAL HIGH (ref 6–23)
CO2: 31 mEq/L (ref 19–32)
Calcium: 9.2 mg/dL (ref 8.4–10.5)
Chloride: 99 mEq/L (ref 96–112)
Creatinine, Ser: 1.16 mg/dL (ref 0.40–1.50)
GFR: 60.34 mL/min (ref >60.00)
Glucose, Bld: 113 mg/dL — ABNORMAL HIGH (ref 70–99)
Potassium: 3.3 mEq/L — ABNORMAL LOW (ref 3.5–5.1)
Sodium: 139 mEq/L (ref 135–145)

## 2018-12-29 LAB — CBC WITH DIFFERENTIAL/PLATELET
Basophils Absolute: 0.1 10*3/uL (ref 0.0–0.1)
Basophils Relative: 0.8 % (ref 0.0–3.0)
Eosinophils Absolute: 0.1 10*3/uL (ref 0.0–0.7)
Eosinophils Relative: 1.3 % (ref 0.0–5.0)
HCT: 34.9 % — ABNORMAL LOW (ref 39.0–52.0)
Hemoglobin: 11.3 g/dL — ABNORMAL LOW (ref 13.0–17.0)
Lymphocytes Relative: 22 % (ref 12.0–46.0)
Lymphs Abs: 1.9 10*3/uL (ref 0.7–4.0)
MCHC: 32.5 g/dL (ref 30.0–36.0)
MCV: 80.8 fl (ref 78.0–100.0)
Monocytes Absolute: 1.1 10*3/uL — ABNORMAL HIGH (ref 0.1–1.0)
Monocytes Relative: 12.5 % — ABNORMAL HIGH (ref 3.0–12.0)
Neutro Abs: 5.5 10*3/uL (ref 1.4–7.7)
Neutrophils Relative %: 63.4 % (ref 43.0–77.0)
Platelets: 367 10*3/uL (ref 150.0–400.0)
RBC: 4.32 Mil/uL (ref 4.22–5.81)
RDW: 14.7 % (ref 11.5–15.5)
WBC: 8.6 10*3/uL (ref 4.0–10.5)

## 2018-12-29 LAB — SEDIMENTATION RATE: Sed Rate: 30 mm/hr — ABNORMAL HIGH (ref 0–20)

## 2018-12-29 LAB — NITRIC OXIDE: Nitric Oxide: 23

## 2018-12-29 MED ORDER — LEVOFLOXACIN 500 MG PO TABS: 500.0000 mg | ORAL_TABLET | Freq: Every day | ORAL | 0 refills | Status: DC

## 2018-12-29 MED ORDER — HYDROCODONE-HOMATROPINE 5-1.5 MG/5ML PO SYRP: 5.0000 mL | ORAL_SOLUTION | Freq: Every evening | ORAL | 0 refills | Status: DC | PRN

## 2018-12-29 MED ORDER — FLUTTER DEVI: 1.0000 | Freq: Every day | 0 refills | Status: DC

## 2018-12-29 NOTE — Progress Notes (Signed)
@Patient  ID: [name], male    DOB: 1935-05-06, 84 y.o.   MRN: [id number]  Chief Complaint  Patient presents with  . Follow-up    for PNA. Patient is still coughing.     Referring provider: Sheliah Hatch, MD  HPI: 84 year old male, previous smoker (Cigars in [place] in 1960 <2 years). PMH significant for chronic cough, CAD, HTN. Recent hospitalization in December 2019 for perforated ulcer. [place] pulmonary patient, consulted in-patient by Dr. Marchelle Gearing for chronic cough and wheeze. Possible underlying asthma. IgE and Allergy panel negative. CT chest 12/28 showed basal atelectasis versus ILD. Needs HRCT in 6-12 weeks.   11/15/2018- Hospital follow-up Patient presents today for hospital follow-up. He is feeling well, reports noticeable improvement in cough and wheeze with nebulizer treatments twice daily. No shortness of breath. Coughing was his biggest complaint. Symptoms have slowly returned but states not nearly as bad as they were. FENO 49 today. Started on Asmanex 2 puffs twice daily and Singulair at bedtime. HRCT planned for March.   12/06/2018- Follow-up  Patient presents today for 2-3 week follow up. Accompanied by wife. Complains of nasal congestion/post nasal drip with yellow mucus. Associated cough and occasional wheezing. Symptoms are worse at night. Continues using Asmanex twice daily, flonase nasal spray and singulair. Needs prn albuterol during the afternoon, reports benefit but only last a few hours. Wife states cough was so much better but returned this week. Denies shortness of breath. HRCT planned for March.    12/30/2018 Presents today for an acute visit to review recent HRCT results. Accompanied by his wife. Patient had a high resolution Chest CT scan 2 days ago that showed extensive clustered centrilobular and tree-in-bud nodular opacity of the right lung, with consolidation of the dependent right lung base. No evidence of fibrotic interstitial lung disease.  Complains of congested/productive cough. He is bringing up yellow colored mucus from his chest. Wife reports that he wakes up at night needing a breathing treatment because he can't stop coughing. Nebulizer does helps. Associated nasal congestion, patient states that he can't breath through his nose when lying down but sitting up he's fine. Denies aspiration  Office testing 12/30/2018: >>O2 low 94% RA with ambulation  >>FENO 23  Significant testing: >>FENO 12/06/2018 - 37 (49) >>Spirometry 11/15/2018 -FVC 2.3 (68%), FEV1 2.0 (87%), ratio 90 Labs: >>IgE 15; Eos absolute 0.1; Resp allergy panel - neg Imaging: >> 12/27/18 HRCT- extensive clustered centrilobular and tree-in-bud nodular opacity of the right lung, with consolidation of the dependent right lung base. No evidence of fibrotic interstitial lung disease >> 10/23/18 CXR- Minimal bibasilar subsegmental atelectasis. >>10/23/18 CT abd/pelvis - 11 mm mean diameter nodule deep in the RIGHT LOWER LOBE adjacent to the RIGHT hemidiaphragm. Minimal dependent atelectasis posteriorly in the lower lobes.     No Known Allergies  Immunization History  Administered Date(s) Administered  . Influenza Split 08/25/2011, 08/09/2012  . Influenza Whole 07/11/2010  . Influenza, High Dose Seasonal PF 08/14/2014, 07/16/2018  . Influenza,inj,Quad PF,6+ Mos 08/11/2013, 08/18/2014, 07/11/2015, 07/14/2016, 07/20/2017  . Pneumococcal Conjugate-13 01/25/2015  . Pneumococcal Polysaccharide-23 09/15/2002  . Td 12/13/2007  . Zoster 10/29/2010    Past Medical History:  Diagnosis Date  . Allergy   . Arthritis    "scattered" (10/26/2018)  . Asthma   . C. difficile diarrhea   . CAD (coronary artery disease) 2005   s/p PCI of RCA  . Carotid artery stenosis    1-39% bilateral by dopplers 10/2016  .  Carotid stenosis    "? side; Dr. Edilia Bo"  . Cognitive impairment, mild, so stated   . Colitis   . Erectile dysfunction   . GERD (gastroesophageal reflux disease)    . Hyperlipidemia   . Hypertension   . Perforated stomach, acute 10/23/2018  . Peripheral vascular disease (HCC)   . Retinal detachment   . Vertebrobasilar artery syndrome   . Vertigo     Tobacco History: Social History   Tobacco Use  Smoking Status Former Smoker  . Years: 2.00  . Types: Cigars  . Last attempt to quit: 05/13/1961  . Years since quitting: 57.6  Smokeless Tobacco Never Used  Tobacco Comment   "never inhaled" in the early 60's    Counseling given: Not Answered Comment: "never inhaled" in the early 60's    Outpatient Medications Prior to Visit  Medication Sig Dispense Refill  . acetaminophen (TYLENOL) 500 MG tablet Take 500 mg by mouth 2 (two) times daily.    Marland Kitchen albuterol (PROVENTIL) (2.5 MG/3ML) 0.083% nebulizer solution Take 3 mLs (2.5 mg total) by nebulization every 6 (six) hours as needed for wheezing or shortness of breath. 90 vial 3  . amLODipine (NORVASC) 5 MG tablet TAKE 1 TABLET BY MOUTH DAILY. PLEASE KEEP UPCOMING APPOINTMENT IN JUNE WITH DOCTOR TURNER FOR FUTURE REFILLS (Patient taking differently: Take 5 mg by mouth daily. ) 90 tablet 2  . aspirin EC 81 MG tablet Take 81 mg by mouth daily.    Marland Kitchen atorvastatin (LIPITOR) 80 MG tablet TAKE 1 TABLET BY MOUTH DAILY (Patient taking differently: Take 80 mg by mouth daily. ) 90 tablet 3  . azelastine (ASTELIN) 0.1 % nasal spray Place 1 spray into both nostrils 2 (two) times daily. 30 mL 5  . Azelastine-Fluticasone 137-50 MCG/ACT SUSP Place 1 puff into the nose 2 (two) times daily. 1 Bottle 3  . carvedilol (COREG) 6.25 MG tablet TAKE 1 TABLET BY MOUTH TWICE DAILY (Patient taking differently: Take 6.25 mg by mouth 2 (two) times daily. ) 180 tablet 2  . cetirizine (ZYRTEC) 10 MG tablet Take 10 mg by mouth daily.    . Diclofenac Sodium 2 % SOLN Place 1 application onto the skin 2 (two) times daily.    Marland Kitchen donepezil (ARICEPT) 10 MG tablet TAKE 1 TABLET BY MOUTH DAILY 90 tablet 0  . Eyelid Cleansers (OCUSOFT EYELID  CLEANSING) PADS Place 1 application into both eyes 2 (two) times daily.    . fish oil-omega-3 fatty acids 1000 MG capsule Take 2 g by mouth 2 (two) times daily.     . fluticasone (FLONASE) 50 MCG/ACT nasal spray 1 spray each nostril twice daily 16 g 5  . fluticasone furoate-vilanterol (BREO ELLIPTA) 100-25 MCG/INH AEPB Inhale 1 puff into the lungs daily. 1 each 0  . gabapentin (NEURONTIN) 100 MG capsule Take 2 capsules (200 mg total) by mouth at bedtime. 180 capsule 0  . hydrochlorothiazide (HYDRODIURIL) 25 MG tablet TAKE 1 TABLET BY MOUTH DAILY (Patient taking differently: Take 25 mg by mouth daily. ) 90 tablet 2  . montelukast (SINGULAIR) 10 MG tablet Take 1 tablet (10 mg total) by mouth at bedtime. 30 tablet 11  . pantoprazole (PROTONIX) 40 MG tablet Take 1 tablet (40 mg total) by mouth 2 (two) times daily. 60 tablet 6  . Polyvinyl Alcohol-Povidone (REFRESH OP) Place 1 drop into both eyes 2 (two) times daily.    . Saw Palmetto 500 MG CAPS Take 1,000 mg by mouth 2 (two) times daily.     Marland Kitchen  Spacer/Aero-Holding [facility] Use as directed. 1 each 0  . traMADol (ULTRAM) 50 MG tablet Take 1 tablet (50 mg total) by mouth every 6 (six) hours as needed. 20 tablet 0  . levofloxacin (LEVAQUIN) 500 MG tablet Take 1 tablet (500 mg total) by mouth daily. 7 tablet 0   Facility-Administered Medications Prior to Visit  Medication Dose Route Frequency Provider Last Rate Last Dose  . 0.9 %  sodium chloride infusion  500 mL Intravenous Once Iva Boop, MD        Review of Systems  Review of Systems  Constitutional: Negative.   HENT: Positive for congestion and postnasal drip.   Respiratory: Positive for cough and wheezing. Negative for shortness of breath.   Cardiovascular: Negative.     Physical Exam  BP 120/60   Pulse 77   Temp 98.7 F (37.1 C) (Oral)   Ht  (1.676 m)   Wt 133 lb (60.3 kg)   SpO2 96%   BMI 21.47 kg/m  Physical Exam Constitutional:      General: He is not in acute  distress.    Appearance: Normal appearance. He is not ill-appearing.     Comments: Appears well  HENT:     Head: Normocephalic and atraumatic.     Right Ear: Tympanic membrane normal.     Left Ear: Tympanic membrane normal.     Nose: Nose normal.     Mouth/Throat:     Mouth: Mucous membranes are moist.     Pharynx: Oropharynx is clear.  Neck:     Musculoskeletal: Normal range of motion and neck supple.  Cardiovascular:     Rate and Rhythm: Normal rate and regular rhythm.  Pulmonary:     Effort: Pulmonary effort is normal. No respiratory distress.     Breath sounds: Rales present.     Comments: Coarse rales/rhonchi right base, congested cough. No resp distress, O2 sat 96% RA Musculoskeletal: Normal range of motion.  Skin:    General: Skin is warm and dry.  Neurological:     General: No focal deficit present.     Mental Status: He is alert and oriented to person, place, and time. Mental status is at baseline.  Psychiatric:        Mood and Affect: Mood normal.        Behavior: Behavior normal.        Thought Content: Thought content normal.        Judgment: Judgment normal.      Lab Results:  CBC    Component Value Date/Time   WBC 8.6 12/29/2018 1259   RBC 4.32 12/29/2018 1259   HGB 11.3 (L) 12/29/2018 1259   HCT 34.9 (L) 12/29/2018 1259   PLT 367.0 12/29/2018 1259   MCV 80.8 12/29/2018 1259   MCH 27.5 10/26/2018 0606   MCHC 32.5 12/29/2018 1259   RDW 14.7 12/29/2018 1259   LYMPHSABS 1.9 12/29/2018 1259   MONOABS 1.1 (H) 12/29/2018 1259   EOSABS 0.1 12/29/2018 1259   BASOSABS 0.1 12/29/2018 1259    BMET    Component Value Date/Time   NA 139 12/29/2018 1259   NA 142 04/16/2018 0948   K 3.3 (L) 12/29/2018 1259   CL 99 12/29/2018 1259   CO2 31 12/29/2018 1259   GLUCOSE 113 (H) 12/29/2018 1259   GLUCOSE 98 10/04/2010   BUN 29 (H) 12/29/2018 1259   BUN 19 04/16/2018 0948   CREATININE 1.16 12/29/2018 1259   CREATININE 0.92 03/31/2016 1051  CALCIUM 9.2  12/29/2018 1259   GFRNONAA 58 (L) 10/26/2018 0606   GFRAA >60 10/26/2018 0606    BNP No results found for: BNP  ProBNP No results found for: PROBNP  Imaging: Ct Chest High Resolution  Result Date: 12/27/2018 CLINICAL DATA:  Persistent cough EXAM: CT CHEST WITHOUT CONTRAST TECHNIQUE: Multidetector CT imaging of the chest was performed following the standard protocol without intravenous contrast. High resolution imaging of the lungs, as well as inspiratory and expiratory imaging, was performed. COMPARISON:  Chest radiograph, 10/24/2018, CT chest, 11/04/2004 FINDINGS: Cardiovascular: Three-vessel coronary artery calcifications and right coronary artery stent. Normal heart size. No pericardial effusion. Mediastinum/Nodes: No enlarged mediastinal, hilar, or axillary lymph nodes. Thyroid gland, trachea, and esophagus demonstrate no significant findings. Lungs/Pleura: There is extensive clustered centrilobular and tree-in-bud nodular opacity of the right lung, with consolidation of the dependent right lung base. Bronchial wall thickening of the right lower lobe. No pleural effusion or pneumothorax. Upper Abdomen: No acute abnormality. Musculoskeletal: No chest wall mass or suspicious bone lesions identified. IMPRESSION: 1. There is extensive clustered centrilobular and tree-in-bud nodular opacity of the right lung, with consolidation of the dependent right lung base. Bronchial wall thickening of the right lower lobe. Findings are consistent with multifocal infection, and specifically aspiration given distribution. 2. No evidence of fibrotic interstitial lung disease. No air trapping. 3.  Coronary artery disease and right coronary artery stent. Electronically Signed   By: Lauralyn PrimesAlex  Bibbey M.D.   On: 12/27/2018 11:41     Assessment & Plan:   Pneumonia of right lower lobe due to infectious organism (HCC) - HRCT on 3/2 showed tree-in-bud nodular opacity right lung with consolidation  - Appears well on exam,  rales right lung base. Amb O2 sat low 94% RA. Afebrile.  - Ordered sputum sample  - RX levaquin 500mg  qd x 7 days (QTC in Dec ok, instructed to monitor for tendonitis symptoms) - Hycodan 5ml at bedtime for cough - Encourage oral hydration  - FU scheduled with Dr. Marchelle Gearingamaswamy for 3/18, will need repeat imaging in 4-6 weeks    Glenford BayleyElizabeth W Dasean Brow, NP 12/30/2018

## 2018-12-29 NOTE — Patient Instructions (Addendum)
Office testing: Ambulatory O2 saturation- within normal limits FENO- normal   Orders: - Labs (cbc, bmet, sed rate) - Sputum sample as previously ordered   RX: -Levaquin 500mg  x 7 days (monitor for tendonitis, please let us know if experiences) -Hycodan cough syrup at night for cough  Cough/Pneumonia Recommendations: - Continue Asmanex with spacer twice daily OR Breo once daily - Back up - Ventolin rescue inhaler with spacer every 6 hours as needed for shortness of breath/wheezing  - If rescue inhaler not helping use Albuterol nebulizer ever 6 hours for shortness of breath/wheezing - Flutter valve every hour while awake - Take mucinex twice daily - Stay well hydrated, drink at least eight 8pz glasses of fluid day  Nasal congestion: - Use Nasal spray if patient is having nasal congestion and has been helpful   Follow-up: - Keep scheduled visit with Dr. Marchelle Gearing

## 2018-12-29 NOTE — Assessment & Plan Note (Addendum)
-   HRCT on 3/2 showed tree-in-bud nodular opacity right lung with consolidation  - Appears well on exam, rales right lung base. Amb O2 sat low 94% RA. Afebrile.  - Ordered sputum sample  - RX levaquin 500mg  qd x 7 days (QTC in Dec ok, instructed to monitor for tendonitis symptoms) - Hycodan 64ml at bedtime for cough - Encourage oral hydration  - FU scheduled with Dr. Marchelle Gearing for 3/18, will need repeat imaging in 4-6 weeks

## 2018-12-30 ENCOUNTER — Encounter: Payer: Self-pay | Admitting: Primary Care

## 2019-01-01 LAB — RESPIRATORY CULTURE OR RESPIRATORY AND SPUTUM CULTURE
MICRO NUMBER:: 275984
RESULT:: NORMAL
SPECIMEN QUALITY: ADEQUATE

## 2019-01-04 ENCOUNTER — Telehealth: Payer: Self-pay | Admitting: Internal Medicine

## 2019-01-04 NOTE — Telephone Encounter (Signed)
Pt is wanting to know the results of his testing for what kind of pneumonia he had. Pt states that he took his last antibiotic today and wants to know if he needs more. Please advise best CB# [phone].   Rescheduled appt with Dr. Marchelle Gearing to [place] - appt on 01/12/19 due to Dr. Marchelle Gearing being called to work at the hospital. Pt is aware and accepted the change of provider.

## 2019-01-05 NOTE — Telephone Encounter (Signed)
Called and spoke with Patient and his Wife Bonita Quin.  Sputum results given.  Understanding stated. Nothing further at this time.

## 2019-01-05 NOTE — Telephone Encounter (Signed)
Notes recorded by Glenford Bayley, NP on 12/30/2018 at 9:07 AM EST Please let patient know labs looked ok. Make sure to drink plenty of fluid and gator aid. Potassium was a little low, recommend eating potassium rich foods such as banana, potatos/sweet potatos, green leafy vegetables, melon, yogurt. Salmon etc. __________________________________________  These results were given to the patient last week, when calling patient this morning and his wife they wanted to know if the results from his sputum culture had come back yet to see what type of pneumonia he has. Patient and his wife also stated that he finished his last antibiotic pill this morning and wanted to know if he needed to take anything else. Patient is feeling much better but still has a cough with clear mucus.   Beth please advise, thank you.

## 2019-01-05 NOTE — Telephone Encounter (Signed)
Sputum culture has been normal so far

## 2019-01-07 ENCOUNTER — Telehealth: Payer: Self-pay | Admitting: Internal Medicine

## 2019-01-07 MED ORDER — FLUTICASONE FUROATE-VILANTEROL 100-25 MCG/INH IN AEPB: 1.0000 | INHALATION_SPRAY | Freq: Every day | RESPIRATORY_TRACT | 5 refills | Status: DC

## 2019-01-07 NOTE — Telephone Encounter (Signed)
Rx sent into pharmacy [facility]. Pt made aware.    Patient Instructions by Glenford Bayley, NP at 12/29/2018 12:00 PM  Author: Glenford Bayley, NP Author Type: Nurse Practitioner Filed: 12/29/2018 12:47 PM  Note Status: Addendum Cosign: Cosign Not Required Encounter Date: 12/29/2018  Editor: Glenford Bayley, NP (Nurse Practitioner)  Prior Versions: 1. Glenford Bayley, NP (Nurse Practitioner) at 12/29/2018 12:46 PM - Addendum   2. Glenford Bayley, NP (Nurse Practitioner) at 12/29/2018 12:37 PM - Signed    Office testing: Ambulatory O2 saturation- within normal limits FENO- normal   Orders: - Labs (cbc, bmet, sed rate) - Sputum sample as previously ordered   RX: -Levaquin 500mg  x 7 days (monitor for tendonitis, please let us know if experiences) -Hycodan cough syrup at night for cough  Cough/Pneumonia Recommendations: - Continue Asmanex with spacer twice daily OR Breo once daily - Back up - Ventolin rescue inhaler with spacer every 6 hours as needed for shortness of breath/wheezing  - If rescue inhaler not helping use Albuterol nebulizer ever 6 hours for shortness of breath/wheezing - Flutter valve every hour while awake - Take mucinex twice daily - Stay well hydrated, drink at least eight 8pz glasses of fluid day  Nasal congestion: - Use Nasal spray if patient is having nasal congestion and has been helpful   Follow-up: - Keep scheduled visit with Dr. Marchelle Gearing

## 2019-01-11 ENCOUNTER — Other Ambulatory Visit: Payer: Self-pay | Admitting: Internal Medicine

## 2019-01-11 DIAGNOSIS — J984 Other disorders of lung: Secondary | ICD-10-CM

## 2019-01-11 NOTE — Progress Notes (Addendum)
  ID: [name], male    DOB: 1935/07/12, 84 y.o.   MRN: [id number]  Chief Complaint  Patient presents with   Follow-up    baseline    Referring provider: Sheliah Hatch, MD  HPI: 84 year old male, previous smoker (Cigars in [place] in 1960 <2 years). PMH significant for chronic cough, CAD, HTN. Recent hospitalization in December 2019 for perforated ulcer. [place] pulmonary patient, consulted in-patient by Dr. Marchelle Gearing for chronic cough and wheeze. Possible underlying asthma. IgE and Allergy panel negative. CT abd 12/28 showed basal atelectasis versus ILD.  CXR 12/29 showed areas of subsegmental atelectasis or scarring in the lung bases bilaterally. Needs HRCT in 6-12 weeks.   Previous [place] Encounters: 1/20/2020[facility] follow-up Patient presents today for hospital follow-up. He is feeling well, reports noticeable improvement in cough and wheeze with nebulizer treatments twice daily. No shortness of breath. Coughing was his biggest complaint. Symptoms have slowly returned but states not nearly as bad as they were. FENO 49 today. Started on Asmanex 2 puffs twice daily and Singulair at bedtime. HRCT planned for March.   12/06/2018- Follow-up  Patient presents today for 2-3 week follow up. Accompanied by wife. Complains of nasal congestion/post nasal drip with yellow mucus. Associated cough and occasional wheezing. Symptoms are worse at night. Continues using Asmanex twice daily, flonase nasal spray and singulair. Needs prn albuterol during the afternoon, reports benefit but only last a few hours. Wife states cough was so much better but returned this week. Denies shortness of breath. HRCT planned for March.    12/30/2018- Acute visit, pneumonia Presents today for an acute visit to review recent HRCT results. Accompanied by his wife. Patient had a high resolution Chest CT scan 2 days ago that showed extensive clustered centrilobular and tree-in-bud nodular opacity of the  right lung, with consolidation of the dependent right lung base. No evidence of fibrotic interstitial lung disease. Complains of congested/productive cough. He is bringing up yellow colored mucus from his chest. Wife reports that he wakes up at night needing a breathing treatment because he can't stop coughing. Nebulizer does helps. Associated nasal congestion, patient states that he can't breath through his nose when lying down but sitting up he's fine. Denies aspiration  01/12/2019 Patient presents today for a two week follow-up visit. Accompanied by his wife. Feeling great, back to baseline. Recent HRCT showed extensive clustered centrilobular and tree-in-bud nodular opacity of the right lung with consolidation of the dependent right lung case. Findings consistent with multifocal infection, specifically aspiration given distrubution. Completed Levaquin course. Sputum culture has shown normal growth, AFB negative. Reports that his cough has been much better but still has some. He has been taking mucinex as directed. Continues Breo inhaler. FENO was previously elevated but trending down. PFTs today appear normal.     Significant testing: >> FENO 12/30/18 - 23 (37) (49) >> 12/30/2018 Ambulatory O2 low 94% RA  Pulmonary function testing: >>PFTs 01/12/2019 - FVC 2.75 (86%), FEV1 2.29 (103%), ratio 83. No BD response. DLCOunc 75% >>Spirometry 11/15/2018 -FVC 2.3 (68%), FEV1 2.0 (87%), ratio 90 Labs: >>IgE 15; Eos absolute 0.1; Resp allergy panel - neg Imaging: >> 12/27/18 HRCT- extensive clustered centrilobular and tree-in-bud nodular opacity of the right lung, with consolidation of the dependent right lung base. No evidence of fibrotic interstitial lung disease. Findings are consistent with multifocal infection, and specifically aspiration given distribution. >> 10/25/19 CXR- Areas of subsegmental atelectasis or scarring in the lung bases bilaterally >> 10/23/18  CXR- Minimal bibasilar subsegmental  atelectasis. >>10/23/18 CT abd/pelvis - 11 mm mean diameter nodule deep in the RIGHT LOWER LOBE adjacent to the RIGHT hemidiaphragm. Minimal dependent atelectasis posteriorly in the lower lobes.  No Known Allergies  Immunization History  Administered Date(s) Administered   Influenza Split 08/25/2011, 08/09/2012   Influenza Whole 07/11/2010   Influenza, High Dose Seasonal PF 08/14/2014, 07/16/2018   Influenza,inj,Quad PF,6+ Mos 08/11/2013, 08/18/2014, 07/11/2015, 07/14/2016, 07/20/2017   Pneumococcal Conjugate-13 01/25/2015   Pneumococcal Polysaccharide-23 09/15/2002   Td 12/13/2007   Zoster 10/29/2010    Past Medical History:  Diagnosis Date   Allergy    Arthritis    "scattered" (10/26/2018)   Asthma    C. difficile diarrhea    CAD (coronary artery disease) 2005   s/p PCI of RCA   Carotid artery stenosis    1-39% bilateral by dopplers 10/2016   Carotid stenosis    "? side; Dr. Edilia Bo"   Cognitive impairment, mild, so stated    Colitis    Erectile dysfunction    GERD (gastroesophageal reflux disease)    Hyperlipidemia    Hypertension    Perforated stomach, acute 10/23/2018   Peripheral vascular disease (HCC)    Retinal detachment    Vertebrobasilar artery syndrome    Vertigo     Tobacco History: Social History   Tobacco Use  Smoking Status Former Smoker   Years: 2.00   Types: Cigars   Last attempt to quit: 05/13/1961   Years since quitting: 57.7  Smokeless Tobacco Never Used  Tobacco Comment   "never inhaled" in the early 60's    Counseling given: Not Answered Comment: "never inhaled" in the early 60's    Outpatient Medications Prior to Visit  Medication Sig Dispense Refill   acetaminophen (TYLENOL) 500 MG tablet Take 500 mg by mouth 2 (two) times daily.     albuterol (PROVENTIL) (2.5 MG/3ML) 0.083% nebulizer solution Take 3 mLs (2.5 mg total) by nebulization every 6 (six) hours as needed for wheezing or shortness of breath.  90 vial 3   amLODipine (NORVASC) 5 MG tablet TAKE 1 TABLET BY MOUTH DAILY. PLEASE KEEP UPCOMING APPOINTMENT IN JUNE WITH DOCTOR TURNER FOR FUTURE REFILLS (Patient taking differently: Take 5 mg by mouth daily. ) 90 tablet 2   aspirin EC 81 MG tablet Take 81 mg by mouth daily.     atorvastatin (LIPITOR) 80 MG tablet TAKE 1 TABLET BY MOUTH DAILY (Patient taking differently: Take 80 mg by mouth daily. ) 90 tablet 3   azelastine (ASTELIN) 0.1 % nasal spray Place 1 spray into both nostrils 2 (two) times daily. 30 mL 5   Azelastine-Fluticasone 137-50 MCG/ACT SUSP Place 1 puff into the nose 2 (two) times daily. 1 Bottle 3   carvedilol (COREG) 6.25 MG tablet TAKE 1 TABLET BY MOUTH TWICE DAILY (Patient taking differently: Take 6.25 mg by mouth 2 (two) times daily. ) 180 tablet 2   cetirizine (ZYRTEC) 10 MG tablet Take 10 mg by mouth daily.     Diclofenac Sodium 2 % SOLN Place 1 application onto the skin 2 (two) times daily.     donepezil (ARICEPT) 10 MG tablet TAKE 1 TABLET BY MOUTH DAILY 90 tablet 0   Eyelid Cleansers (OCUSOFT EYELID CLEANSING) PADS Place 1 application into both eyes 2 (two) times daily.     fish oil-omega-3 fatty acids 1000 MG capsule Take 2 g by mouth 2 (two) times daily.      fluticasone furoate-vilanterol (BREO ELLIPTA) 100-25 MCG/INH  AEPB Inhale 1 puff into the lungs daily. 28 each 5   gabapentin (NEURONTIN) 100 MG capsule Take 2 capsules (200 mg total) by mouth at bedtime. 180 capsule 0   hydrochlorothiazide (HYDRODIURIL) 25 MG tablet TAKE 1 TABLET BY MOUTH DAILY 90 tablet 0   HYDROcodone-homatropine (HYCODAN) 5-1.5 MG/5ML syrup Take 5 mLs by mouth at bedtime and may repeat dose one time if needed. 120 mL 0   levofloxacin (LEVAQUIN) 500 MG tablet Take 1 tablet (500 mg total) by mouth daily. 7 tablet 0   montelukast (SINGULAIR) 10 MG tablet Take 1 tablet (10 mg total) by mouth at bedtime. 30 tablet 11   pantoprazole (PROTONIX) 40 MG tablet Take 1 tablet (40 mg total)  by mouth 2 (two) times daily. 60 tablet 6   Polyvinyl Alcohol-Povidone (REFRESH OP) Place 1 drop into both eyes 2 (two) times daily.     Respiratory Therapy Supplies (FLUTTER) DEVI 1 Device by Does not apply route daily. 1 each 0   Saw Palmetto 500 MG CAPS Take 1,000 mg by mouth 2 (two) times daily.      Spacer/Aero-Holding  [facility] Use as directed. 1 each 0   traMADol (ULTRAM) 50 MG tablet Take 1 tablet (50 mg total) by mouth every 6 (six) hours as needed. 20 tablet 0   fluticasone (FLONASE) 50 MCG/ACT nasal spray 1 spray each nostril twice daily (Patient not taking: Reported on 01/12/2019) 16 g 5   Facility-Administered Medications Prior to Visit  Medication Dose Route Frequency Provider Last Rate Last Dose   0.9 %  sodium chloride infusion  500 mL Intravenous Once Iva Boop, MD        Review of Systems  Review of Systems  Constitutional: Negative.   HENT: Positive for postnasal drip.   Respiratory: Positive for cough. Negative for shortness of breath and wheezing.   Cardiovascular: Negative.     Physical Exam  BP 126/62 (BP Location: Left Arm, Cuff Size: Normal)    Pulse 66    Temp 98.2 F (36.8 C)    Ht 5\' 5"  (1.651 m)    Wt 136 lb (61.7 kg)    SpO2 96%    BMI 22.63 kg/m  Physical Exam Constitutional:      Appearance: Normal appearance.  HENT:     Head: Normocephalic and atraumatic.     Mouth/Throat:     Mouth: Mucous membranes are moist.     Pharynx: Oropharynx is clear.     Comments: Frequent throat clearing/PND Cardiovascular:     Rate and Rhythm: Normal rate and regular rhythm.  Pulmonary:     Effort: Pulmonary effort is normal. No respiratory distress.     Breath sounds: No stridor. No wheezing.     Comments: Crackles/rales right base only, otherwise lungs clear. No resp distress or wheezing.  Musculoskeletal: Normal range of motion.  Skin:    General: Skin is warm and dry.  Neurological:     Mental Status: He is alert.  Psychiatric:         Mood and Affect: Mood normal.        Behavior: Behavior normal.        Thought Content: Thought content normal.        Judgment: Judgment normal.      Lab Results:  CBC    Component Value Date/Time   WBC 8.6 12/29/2018 1259   RBC 4.32 12/29/2018 1259   HGB 11.3 (L) 12/29/2018 1259   HCT 34.9 (L) 12/29/2018 1259  PLT 367.0 12/29/2018 1259   MCV 80.8 12/29/2018 1259   MCH 27.5 10/26/2018 0606   MCHC 32.5 12/29/2018 1259   RDW 14.7 12/29/2018 1259   LYMPHSABS 1.9 12/29/2018 1259   MONOABS 1.1 (H) 12/29/2018 1259   EOSABS 0.1 12/29/2018 1259   BASOSABS 0.1 12/29/2018 1259    BMET    Component Value Date/Time   NA 139 12/29/2018 1259   NA 142 04/16/2018 0948   K 3.3 (L) 12/29/2018 1259   CL 99 12/29/2018 1259   CO2 31 12/29/2018 1259   GLUCOSE 113 (H) 12/29/2018 1259   GLUCOSE 98 10/04/2010   BUN 29 (H) 12/29/2018 1259   BUN 19 04/16/2018 0948   CREATININE 1.16 12/29/2018 1259   CREATININE 0.92 03/31/2016 1051   CALCIUM 9.2 12/29/2018 1259   GFRNONAA 58 (L) 10/26/2018 0606   GFRAA >60 10/26/2018 0606    BNP No results found for: BNP  ProBNP No results found for: PROBNP  Imaging: Ct Chest High Resolution  Result Date: 12/27/2018 CLINICAL DATA:  Persistent cough EXAM: CT CHEST WITHOUT CONTRAST TECHNIQUE: Multidetector CT imaging of the chest was performed following the standard protocol without intravenous contrast. High resolution imaging of the lungs, as well as inspiratory and expiratory imaging, was performed. COMPARISON:  Chest radiograph, 10/24/2018, CT chest, 11/04/2004 FINDINGS: Cardiovascular: Three-vessel coronary artery calcifications and right coronary artery stent. Normal heart size. No pericardial effusion. Mediastinum/Nodes: No enlarged mediastinal, hilar, or axillary lymph nodes. Thyroid gland, trachea, and esophagus demonstrate no significant findings. Lungs/Pleura: There is extensive clustered centrilobular and tree-in-bud nodular opacity of the right  lung, with consolidation of the dependent right lung base. Bronchial wall thickening of the right lower lobe. No pleural effusion or pneumothorax. Upper Abdomen: No acute abnormality. Musculoskeletal: No chest wall mass or suspicious bone lesions identified. IMPRESSION: 1. There is extensive clustered centrilobular and tree-in-bud nodular opacity of the right lung, with consolidation of the dependent right lung base. Bronchial wall thickening of the right lower lobe. Findings are consistent with multifocal infection, and specifically aspiration given distribution. 2. No evidence of fibrotic interstitial lung disease. No air trapping. 3.  Coronary artery disease and right coronary artery stent. Electronically Signed   By: Lauralyn Primes M.D.   On: 12/27/2018 11:41     Assessment & Plan:   Pneumonia of right lower lobe due to infectious organism (HCC) - Cough has improved, completed Levaquin course - LS with minimal rales right lung base - Needs repeat CHEST CT wo contrast in 4 weeks, if opacity persists may need bronchoscopy for further evaluation  - Patient denies aspiration, may need swallow eval in the future  - Continue mucinex, add incentive spirometer  - Needs to establish care with one of our pulmonary MDs for follow up in 4 weeks   Restrictive lung disease - PFTs today appears normal. No evidence of restriction or obstruction. No BD response. DLCO 75%. F/V normal.  - Continue Breo for now, can discuss stopping at next visit  - Recommend IS every hour  Rhinitis, chronic - Continue Azelastine/flonase as needed for PND symptoms    Glenford Bayley, NP 01/12/2019

## 2019-01-12 ENCOUNTER — Ambulatory Visit (INDEPENDENT_AMBULATORY_CARE_PROVIDER_SITE_OTHER): Payer: Medicare Other | Admitting: Primary Care

## 2019-01-12 ENCOUNTER — Other Ambulatory Visit: Payer: Self-pay | Admitting: Cardiology

## 2019-01-12 ENCOUNTER — Encounter: Payer: Self-pay | Admitting: Primary Care

## 2019-01-12 ENCOUNTER — Other Ambulatory Visit: Payer: Self-pay

## 2019-01-12 ENCOUNTER — Ambulatory Visit (INDEPENDENT_AMBULATORY_CARE_PROVIDER_SITE_OTHER): Payer: Medicare Other | Admitting: Internal Medicine

## 2019-01-12 ENCOUNTER — Ambulatory Visit: Payer: Medicare Other | Admitting: Internal Medicine

## 2019-01-12 VITALS — BP 126/62 | HR 66 | Temp 98.2°F | Ht 65.0 in | Wt 136.0 lb

## 2019-01-12 DIAGNOSIS — J181 Lobar pneumonia, unspecified organism: Secondary | ICD-10-CM

## 2019-01-12 DIAGNOSIS — J310 Chronic rhinitis: Secondary | ICD-10-CM | POA: Diagnosis not present

## 2019-01-12 DIAGNOSIS — J984 Other disorders of lung: Secondary | ICD-10-CM

## 2019-01-12 DIAGNOSIS — J189 Pneumonia, unspecified organism: Secondary | ICD-10-CM

## 2019-01-12 LAB — PULMONARY FUNCTION TEST
DL/VA % pred: 81 %
DL/VA: 3.24 ml/min/mmHg/L
DLCO unc % pred: 75 %
DLCO unc: 15.59 ml/min/mmHg
FEF 25-75 Post: 2.37 L/sec
FEF 25-75 Pre: 1.98 L/sec
FEF2575-%CHANGE-POST: 19 %
FEF2575-%Pred-Post: 161 %
FEF2575-%Pred-Pre: 134 %
FEV1-%Change-Post: 6 %
FEV1-%Pred-Post: 103 %
FEV1-%Pred-Pre: 97 %
FEV1-Post: 2.29 L
FEV1-Pre: 2.16 L
FEV1FVC-%Change-Post: 6 %
FEV1FVC-%PRED-PRE: 109 %
FEV6-%Change-Post: 0 %
FEV6-%Pred-Post: 93 %
FEV6-%Pred-Pre: 92 %
FEV6-POST: 2.73 L
FEV6-Pre: 2.71 L
FEV6FVC-%Change-Post: 0 %
FEV6FVC-%PRED-POST: 107 %
FEV6FVC-%Pred-Pre: 107 %
FVC-%CHANGE-POST: 0 %
FVC-%Pred-Post: 86 %
FVC-%Pred-Pre: 87 %
FVC-Post: 2.75 L
FVC-Pre: 2.77 L
PRE FEV1/FVC RATIO: 78 %
Post FEV1/FVC ratio: 83 %
Post FEV6/FVC ratio: 99 %
Pre FEV6/FVC Ratio: 99 %
RV % pred: 103 %
RV: 2.49 L
TLC % pred: 89 %
TLC: 5.42 L

## 2019-01-12 NOTE — Progress Notes (Signed)
PFT done today. 

## 2019-01-12 NOTE — Patient Instructions (Addendum)
Recommendations: Continue Breo until next visit Continue mucinex until next visit   Orders: CT chest without contrast in 4 weeks re: pneumonia Needs Incentive spirometer, use every hour while awake to practice deep breathing e  Follow-up: Needs to establish care with [place] pulmonary provider (new patient) Needs visit in 4 weeks with MD ONLY after CT chest

## 2019-01-12 NOTE — Assessment & Plan Note (Signed)
-   Continue Azelastine/flonase as needed for PND symptoms

## 2019-01-12 NOTE — Assessment & Plan Note (Signed)
-   Cough has improved, completed Levaquin course - LS with minimal rales right lung base - Needs repeat CHEST CT wo contrast in 4 weeks, if opacity persists may need bronchoscopy for further evaluation  - Patient denies aspiration, may need swallow eval in the future  - Continue mucinex, add incentive spirometer  - Needs to establish care with one of our pulmonary MDs for follow up in 4 weeks

## 2019-01-12 NOTE — Assessment & Plan Note (Signed)
-   PFTs today appears normal. No evidence of restriction or obstruction. No BD response. DLCO 75%. F/V normal.  - Continue Breo for now, can discuss stopping at next visit  - Recommend IS every hour

## 2019-01-20 ENCOUNTER — Other Ambulatory Visit: Payer: Self-pay | Admitting: Family Medicine

## 2019-01-21 MED ORDER — GABAPENTIN 100 MG PO CAPS: 200.0000 mg | ORAL_CAPSULE | Freq: Every day | ORAL | 0 refills | Status: DC

## 2019-01-28 NOTE — Telephone Encounter (Signed)
Ok Waynetta Sandy has seen

## 2019-02-08 ENCOUNTER — Telehealth: Payer: Self-pay | Admitting: *Deleted

## 2019-02-08 NOTE — Telephone Encounter (Signed)
Covid-19 travel screening questions  Have you traveled in the last 14 days? If yes where? No Do you now or have you had a fever in the last 14 days? No Do you have any respiratory symptoms of shortness of breath or cough now or in the last 14 days? No Do you have any family members or close contacts with diagnosed or suspected Covid-19? No      

## 2019-02-09 ENCOUNTER — Inpatient Hospital Stay: Admission: RE | Admit: 2019-02-09 | Payer: Medicare Other | Source: Ambulatory Visit

## 2019-02-09 ENCOUNTER — Other Ambulatory Visit: Payer: Self-pay

## 2019-02-09 ENCOUNTER — Ambulatory Visit (INDEPENDENT_AMBULATORY_CARE_PROVIDER_SITE_OTHER)
Admission: RE | Admit: 2019-02-09 | Discharge: 2019-02-09 | Disposition: A | Discharge status: Home / Self Care | Payer: Medicare Other | Source: Ambulatory Visit | Attending: Primary Care | Admitting: Primary Care

## 2019-02-09 DIAGNOSIS — J189 Pneumonia, unspecified organism: Secondary | ICD-10-CM

## 2019-02-09 DIAGNOSIS — J181 Lobar pneumonia, unspecified organism: Secondary | ICD-10-CM | POA: Diagnosis not present

## 2019-02-10 ENCOUNTER — Other Ambulatory Visit: Payer: Self-pay | Admitting: Cardiology

## 2019-02-11 ENCOUNTER — Telehealth: Payer: Self-pay | Admitting: Primary Care

## 2019-02-11 LAB — AFB CULTURE WITH SMEAR (NOT AT ARMC)
Acid Fast Culture: NEGATIVE
Acid Fast Smear: NEGATIVE

## 2019-02-11 NOTE — Telephone Encounter (Signed)
Please let patient know CT showed improvement right lower lobe pneumonia. There remains some patchy infiltrates as well as bronchial thickening. No underlying mass lesion. Will need continued follow-up. Needs apt with Dr. Marchelle Gearing only, next available 30 min new patient slot please.

## 2019-02-11 NOTE — Telephone Encounter (Signed)
Relayed results to pt.  Pt wanted to see Dr Tonia Brooms.  Appt made 05/05/19 at 330p.

## 2019-02-11 NOTE — Telephone Encounter (Signed)
Spoke with pt and wife.  Relayed results.  Pt wanted to see Dr Tonia Brooms.  Scheduled 05/05/19 at 330pm.  Nothing further is needed.

## 2019-02-15 ENCOUNTER — Other Ambulatory Visit: Payer: Self-pay | Admitting: Family Medicine

## 2019-02-15 ENCOUNTER — Telehealth: Payer: Self-pay | Admitting: Primary Care

## 2019-02-15 MED ORDER — FLUTICASONE PROPIONATE 50 MCG/ACT NA SUSP: NASAL | 5 refills | Status: DC

## 2019-02-15 NOTE — Telephone Encounter (Signed)
Flonase Rx sent to pt's preferred pharmacy. Called and spoke with pt letting him know this had been done. Pt expressed understanding. nothing further needed.

## 2019-02-17 ENCOUNTER — Ambulatory Visit: Payer: Medicare Other | Admitting: Pulmonary Disease

## 2019-03-22 ENCOUNTER — Other Ambulatory Visit: Payer: Self-pay | Admitting: Family Medicine

## 2019-03-27 ENCOUNTER — Other Ambulatory Visit: Payer: Self-pay | Admitting: Cardiology

## 2019-04-04 DIAGNOSIS — H2512 Age-related nuclear cataract, left eye: Secondary | ICD-10-CM | POA: Diagnosis not present

## 2019-04-04 DIAGNOSIS — H25043 Posterior subcapsular polar age-related cataract, bilateral: Secondary | ICD-10-CM | POA: Diagnosis not present

## 2019-04-04 DIAGNOSIS — H25013 Cortical age-related cataract, bilateral: Secondary | ICD-10-CM | POA: Diagnosis not present

## 2019-04-14 ENCOUNTER — Other Ambulatory Visit: Payer: Self-pay | Admitting: Cardiology

## 2019-04-20 ENCOUNTER — Other Ambulatory Visit: Payer: Self-pay | Admitting: *Deleted

## 2019-04-20 MED ORDER — GABAPENTIN 100 MG PO CAPS: 200.0000 mg | ORAL_CAPSULE | Freq: Every day | ORAL | 1 refills | Status: DC

## 2019-04-20 NOTE — Telephone Encounter (Signed)
Pt called requesting a refill for gabapentin 100mg  sent into [facility].  Refill done.

## 2019-05-01 NOTE — Progress Notes (Signed)
Reviewed and agree with assessment/plan.   Joram Venson, MD [place] [facility] 10/22/2016, 12:24 PM Pager:  [phone]  

## 2019-05-04 ENCOUNTER — Ambulatory Visit (INDEPENDENT_AMBULATORY_CARE_PROVIDER_SITE_OTHER): Payer: Medicare Other | Admitting: Family Medicine

## 2019-05-04 ENCOUNTER — Encounter: Payer: Self-pay | Admitting: Family Medicine

## 2019-05-04 ENCOUNTER — Other Ambulatory Visit: Payer: Self-pay

## 2019-05-04 VITALS — BP 121/60 | HR 63 | Temp 98.0°F | Resp 16 | Ht 65.0 in | Wt 143.4 lb

## 2019-05-04 DIAGNOSIS — Z0000 Encounter for general adult medical examination without abnormal findings: Secondary | ICD-10-CM | POA: Diagnosis not present

## 2019-05-04 DIAGNOSIS — I10 Essential (primary) hypertension: Secondary | ICD-10-CM | POA: Diagnosis not present

## 2019-05-04 DIAGNOSIS — E7800 Pure hypercholesterolemia, unspecified: Secondary | ICD-10-CM

## 2019-05-04 LAB — BASIC METABOLIC PANEL
BUN: 21 mg/dL (ref 6–23)
CO2: 27 mEq/L (ref 19–32)
Calcium: 9 mg/dL (ref 8.4–10.5)
Chloride: 103 mEq/L (ref 96–112)
Creatinine, Ser: 1.07 mg/dL (ref 0.40–1.50)
GFR: 66.18 mL/min (ref >60.00)
Glucose, Bld: 94 mg/dL (ref 70–99)
Potassium: 3.9 mEq/L (ref 3.5–5.1)
Sodium: 139 mEq/L (ref 135–145)

## 2019-05-04 LAB — HEPATIC FUNCTION PANEL
ALT: 18 U/L (ref 0–53)
AST: 22 U/L (ref 0–37)
Albumin: 4.3 g/dL (ref 3.5–5.2)
Alkaline Phosphatase: 79 U/L (ref 39–117)
Bilirubin, Direct: 0.1 mg/dL (ref 0.0–0.3)
Total Bilirubin: 0.5 mg/dL (ref 0.2–1.2)
Total Protein: 6.8 g/dL (ref 6.0–8.3)

## 2019-05-04 LAB — CBC WITH DIFFERENTIAL/PLATELET
Basophils Absolute: 0.1 10*3/uL (ref 0.0–0.1)
Basophils Relative: 0.9 % (ref 0.0–3.0)
Eosinophils Absolute: 0.1 10*3/uL (ref 0.0–0.7)
Eosinophils Relative: 1.2 % (ref 0.0–5.0)
HCT: 39.9 % (ref 39.0–52.0)
Hemoglobin: 13.2 g/dL (ref 13.0–17.0)
Lymphocytes Relative: 28.8 % (ref 12.0–46.0)
Lymphs Abs: 1.7 10*3/uL (ref 0.7–4.0)
MCHC: 33 g/dL (ref 30.0–36.0)
MCV: 82.2 fl (ref 78.0–100.0)
Monocytes Absolute: 0.7 10*3/uL (ref 0.1–1.0)
Monocytes Relative: 11.1 % (ref 3.0–12.0)
Neutro Abs: 3.4 10*3/uL (ref 1.4–7.7)
Neutrophils Relative %: 58 % (ref 43.0–77.0)
Platelets: 199 10*3/uL (ref 150.0–400.0)
RBC: 4.85 Mil/uL (ref 4.22–5.81)
RDW: 17.5 % — ABNORMAL HIGH (ref 11.5–15.5)
WBC: 5.9 10*3/uL (ref 4.0–10.5)

## 2019-05-04 LAB — LIPID PANEL
Cholesterol: 109 mg/dL (ref 0–200)
HDL: 39 mg/dL — ABNORMAL LOW (ref >39.00)
LDL Cholesterol: 48 mg/dL (ref 0–99)
NonHDL: 69.68
Total CHOL/HDL Ratio: 3
Triglycerides: 110 mg/dL (ref 0.0–149.0)
VLDL: 22 mg/dL (ref 0.0–40.0)

## 2019-05-04 LAB — TSH: TSH: 3.29 u[IU]/mL (ref 0.35–4.50)

## 2019-05-04 NOTE — Assessment & Plan Note (Signed)
Chronic problem, tolerating statin w/o difficulty.  Check labs.  Adjust meds prn  

## 2019-05-04 NOTE — Assessment & Plan Note (Signed)
Pt's PE WNL and unchanged from previous.  UTD on immunizations.  No longer having colon cancer screening.  Check labs.  Anticipatory guidance provided.

## 2019-05-04 NOTE — Assessment & Plan Note (Signed)
Chronic problem.  Well controlled.  Asymptomatic.  Check labs.  No anticipated med changes. 

## 2019-05-04 NOTE — Progress Notes (Signed)
   Subjective:    Patient ID: [name], male    DOB: 1935-01-16, 84 y.o.   MRN: [id number]  HPI CPE- UTD on urology (appt later this month), UTD on immunizations.   Review of Systems Patient reports no vision/hearing changes, anorexia, fever ,adenopathy, persistant/recurrent hoarseness, swallowing issues, chest pain, palpitations, edema, hemoptysis, dyspnea (rest,exertional, paroxysmal nocturnal), gastrointestinal  bleeding (melena, rectal bleeding), abdominal pain, excessive heart burn, GU symptoms (dysuria, hematuria, voiding/incontinence issues) syncope, focal weakness, memory loss, numbness & tingling, skin/hair/nail changes, depression, anxiety, abnormal bruising/bleeding, musculoskeletal symptoms/signs.   Ongoing cough- worse w/ eating, has pulmonary appt tomorrow.  Has had GI workup.      Objective:   Physical Exam General Appearance:    Alert, cooperative, no distress, appears stated age  Head:    Normocephalic, without obvious abnormality, atraumatic  Eyes:    PERRL, conjunctiva/corneas clear, EOM's intact, fundi    benign, both eyes       Ears:    Normal TM's and external ear canals, both ears  Nose:   Deferred due to COVID  Throat:   Neck:   Supple, symmetrical, trachea midline, no adenopathy;       thyroid:  No enlargement/tenderness/nodules  Back:     Symmetric, no curvature, ROM normal, no CVA tenderness  Lungs:     Clear to auscultation bilaterally, respirations unlabored  Chest wall:    No tenderness or deformity  Heart:    Regular rate and rhythm, S1 and S2 normal, no murmur, rub   or gallop  Abdomen:     Soft, non-tender, bowel sounds active all four quadrants,    no masses, no organomegaly  Genitalia:    Deferred to urology  Rectal:    Extremities:   Extremities normal, atraumatic, no cyanosis or edema  Pulses:   2+ and symmetric all extremities  Skin:   Skin color, texture, turgor normal, no rashes or lesions  Lymph nodes:   Cervical, supraclavicular, and  axillary nodes normal  Neurologic:   CNII-XII intact. Normal strength, sensation and reflexes      throughout          Assessment & Plan:

## 2019-05-04 NOTE — Patient Instructions (Signed)
Follow up in 6 months to recheck BP and cholesterol We'll notify you of your lab results and make any changes if needed Continue to work on healthy diet and regular exercise- you can do it! Call with any questions or concerns Stay Safe!!! 

## 2019-05-05 ENCOUNTER — Ambulatory Visit: Payer: Medicare Other | Admitting: Pulmonary Disease

## 2019-05-05 ENCOUNTER — Encounter: Payer: Self-pay | Admitting: Pulmonary Disease

## 2019-05-05 VITALS — BP 128/70 | HR 67

## 2019-05-05 DIAGNOSIS — R0981 Nasal congestion: Secondary | ICD-10-CM

## 2019-05-05 DIAGNOSIS — R053 Chronic cough: Secondary | ICD-10-CM

## 2019-05-05 DIAGNOSIS — J181 Lobar pneumonia, unspecified organism: Secondary | ICD-10-CM

## 2019-05-05 DIAGNOSIS — R1310 Dysphagia, unspecified: Secondary | ICD-10-CM

## 2019-05-05 DIAGNOSIS — J189 Pneumonia, unspecified organism: Secondary | ICD-10-CM

## 2019-05-05 DIAGNOSIS — R05 Cough: Secondary | ICD-10-CM | POA: Diagnosis not present

## 2019-05-05 NOTE — Progress Notes (Signed)
Synopsis: Referred in July 2020 for follow-up from pneumonia by Sheliah Hatchabori, Katherine E, MD  Subjective:   PATIENT ID: [name] GENDER: male DOB: [birth year], MRN: [id number]  Chief Complaint  Patient presents with   Consult    F/U re:PNE. He states he is always congested and has a hard time breathing due to the congestion.     This is an 84 year old gentleman seen in clinic today to establish care with pulmonary after a follow-up for recent pneumonia.  Patient was seen initially by Buelah ManisBeth Walsh, NP in January 2020 for evaluation of cough.  In March 2020 patient was seen for possible pneumonia.  He had an HRCT which showed clustered centrilobular and tree-in-bud nodular opacities within the right lung at the time considered relation to aspiration.  He had sputum cultures with normal growth and AFB was negative.  Treated with Brio inhaler.  He is a former smoker, smoked in [employer] cigars less than 2 years.  Has a past medical history of chronic cough, coronary artery disease hypertension.  In 2019 had a perforated ulcer.  Patient was initially seen and evaluated by Dr. Marchelle Gearingamaswamy.  He was seen as a pulmonary consultation in December 2019.  This was during his hospitalization for a perforated gastric ulcer status post [name] patch.  Non smoker, [employer], was in [employer], masters in [employer] then went to [employer]. Joined [employer] afterwards. 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 It was used for planning purposes. 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 He worked for [employer] 35 years and retired in 2000.  [place]  Patient has been seen in the past by ear nose and throat for chronic nasal congestion.  He has a history of polyps that have been removed in the past.  He is currently on a regular nasal spray.  He still feels like  his congestion in his nose makes his breathing worse at times.   Past Medical History:  Diagnosis Date   Allergy    Arthritis    "scattered" (10/26/2018)   Asthma    C. difficile diarrhea    CAD (coronary artery disease) 2005   s/p PCI of RCA   Carotid artery stenosis    1-39% bilateral by dopplers 10/2016   Carotid stenosis    "? side; Dr. Edilia Boickson"   Cognitive impairment, mild, so stated    Colitis    Erectile dysfunction    GERD (gastroesophageal reflux disease)    Hyperlipidemia    Hypertension    Perforated stomach, acute 10/23/2018   Peripheral vascular disease (HCC)    Retinal detachment    Vertebrobasilar artery syndrome    Vertigo      Family History  Problem Relation Age of Onset   Stroke Mother    Hyperlipidemia Mother    Hypertension Mother    Deep vein thrombosis Mother    Stroke Father    Hyperlipidemia Father    Hypertension Father    Deep vein thrombosis Father    Diverticulitis Father    Ulcerative colitis Father    Colon polyps Father    Colon cancer Neg Hx    Esophageal cancer Neg Hx    Stomach cancer Neg Hx    Rectal cancer Neg Hx      Past  Surgical History:  Procedure Laterality Date   APPENDECTOMY  1954   COLONOSCOPY     ESOPHAGOGASTRODUODENOSCOPY (EGD) WITH ESOPHAGEAL DILATION  09/2018   EYE SURGERY     LAPAROTOMY N/A 10/23/2018   Procedure: EXPLORATORY LAPAROTOMY WITH GRAHAM PATCH FOR PERFORATED ULCER AND PLACEMENT OF GASTRIC TUBE;  Surgeon: Emelia Loron, MD;  Location: [facility] OR;  Service: General;  Laterality: N/A;   NASAL SINUS SURGERY  05/2018   PR VEIN BYPASS GRAFT,AORTO-FEM-POP  2007   RETINAL DETACHMENT SURGERY     "? side"   UPPER GASTROINTESTINAL ENDOSCOPY      Social History   Socioeconomic History   Marital status: Married    Spouse name: Not on file   Number of children: Not on file   Years of education: Not on file   Highest education level: Not on file    Occupational History   Not on file  Social Needs   Financial resource strain: Not on file   Food insecurity    Worry: Not on file    Inability: Not on file   Transportation needs    Medical: Not on file    Non-medical: Not on file  Tobacco Use   Smoking status: Former Smoker    Years: 2.00    Types: Cigars    Quit date: 05/13/1961    Years since quitting: 58.0   Smokeless tobacco: Never Used   Tobacco comment: "never inhaled" in the early 60's   Substance and Sexual Activity   Alcohol use: Yes    Alcohol/week: 14.0 standard drinks    Types: 14 Glasses of wine per week   Drug use: Never   Sexual activity: Not Currently  Lifestyle   Physical activity    Days per week: Not on file    Minutes per session: Not on file   Stress: Not on file  Relationships   Social connections    Talks on phone: Not on file    Gets together: Not on file    Attends religious service: Not on file    Active member of club or organization: Not on file    Attends meetings of clubs or organizations: Not on file    Relationship status: Not on file   Intimate partner violence    Fear of current or ex partner: Not on file    Emotionally abused: Not on file    Physically abused: Not on file    Forced sexual activity: Not on file  Other Topics Concern   Not on file  Social History Narrative   Married with 1 son and 2 daughters.  Retired.   2 caffeinated beverages daily, 3 glasses of wine daily   Never smoker no drug use no tobacco     No Known Allergies   Outpatient Medications Prior to Visit  Medication Sig Dispense Refill   acetaminophen (TYLENOL) 500 MG tablet Take 500 mg by mouth 2 (two) times daily.     albuterol (PROVENTIL) (2.5 MG/3ML) 0.083% nebulizer solution Take 3 mLs (2.5 mg total) by nebulization every 6 (six) hours as needed for wheezing or shortness of breath. 90 vial 3   amLODipine (NORVASC) 5 MG tablet Take 1 tablet (5 mg total) by mouth daily. Please call and  schedule appt for further refills 1st attempt 90 tablet 0   aspirin EC 81 MG tablet Take 81 mg by mouth daily.     atorvastatin (LIPITOR) 80 MG tablet TAKE 1 TABLET BY MOUTH DAILY (Patient taking differently:  Take 80 mg by mouth daily. ) 90 tablet 3   azelastine (ASTELIN) 0.1 % nasal spray Place 1 spray into both nostrils 2 (two) times daily. 30 mL 5   Azelastine-Fluticasone 137-50 MCG/ACT SUSP Place 1 puff into the nose 2 (two) times daily. 1 Bottle 3   carvedilol (COREG) 6.25 MG tablet TAKE 1 TABLET BY MOUTH TWICE DAILY 180 tablet 1   cetirizine (ZYRTEC) 10 MG tablet Take 10 mg by mouth daily.     Diclofenac Sodium 2 % SOLN Place 1 application onto the skin 2 (two) times daily.     donepezil (ARICEPT) 10 MG tablet TAKE 1 TABLET BY MOUTH DAILY 90 tablet 0   Eyelid Cleansers (OCUSOFT EYELID CLEANSING) PADS Place 1 application into both eyes 2 (two) times daily.     fish oil-omega-3 fatty acids 1000 MG capsule Take 2 g by mouth 2 (two) times daily.      fluticasone (FLONASE) 50 MCG/ACT nasal spray 1 spray each nostril twice daily 16 g 5   fluticasone furoate-vilanterol (BREO ELLIPTA) 100-25 MCG/INH AEPB Inhale 1 puff into the lungs daily. 28 each 5   gabapentin (NEURONTIN) 100 MG capsule Take 2 capsules (200 mg total) by mouth at bedtime. 180 capsule 1   hydrochlorothiazide (HYDRODIURIL) 25 MG tablet TAKE 1 TABLET BY MOUTH DAILY 90 tablet 0   montelukast (SINGULAIR) 10 MG tablet Take 1 tablet (10 mg total) by mouth at bedtime. 30 tablet 11   pantoprazole (PROTONIX) 40 MG tablet Take 1 tablet (40 mg total) by mouth 2 (two) times daily. 60 tablet 6   Polyvinyl Alcohol-Povidone (REFRESH OP) Place 1 drop into both eyes 2 (two) times daily.     Respiratory Therapy Supplies (FLUTTER) DEVI 1 Device by Does not apply route daily. 1 each 0   Saw Palmetto 500 MG CAPS Take 1,000 mg by mouth 2 (two) times daily.      Spacer/Aero-Holding [facility] DEVI Use as directed. 1 each 0   traMADol  (ULTRAM) 50 MG tablet Take 1 tablet (50 mg total) by mouth every 6 (six) hours as needed. 20 tablet 0   No facility-administered medications prior to visit.     Review of Systems  Constitutional: Negative for chills, fever, malaise/fatigue and weight loss.  HENT: Positive for congestion. Negative for hearing loss, sore throat and tinnitus.   Eyes: Negative for blurred vision and double vision.  Respiratory: Positive for cough. Negative for hemoptysis, sputum production, shortness of breath, wheezing and stridor.   Cardiovascular: Negative for chest pain, palpitations, orthopnea, leg swelling and PND.  Gastrointestinal: Negative for abdominal pain, constipation, diarrhea, heartburn, nausea and vomiting.  Genitourinary: Negative for dysuria, hematuria and urgency.  Musculoskeletal: Negative for joint pain and myalgias.  Skin: Negative for itching and rash.  Neurological: Negative for dizziness, tingling, weakness and headaches.  Endo/Heme/Allergies: Negative for environmental allergies. Does not bruise/bleed easily.  Psychiatric/Behavioral: Negative for depression. The patient is not nervous/anxious and does not have insomnia.   All other systems reviewed and are negative.    Objective:  Physical Exam Vitals signs reviewed.  Constitutional:      General: He is not in acute distress.    Appearance: He is well-developed.  HENT:     Head: Normocephalic and atraumatic.     Nose: Congestion present.  Eyes:     General: No scleral icterus.    Conjunctiva/sclera: Conjunctivae normal.     Pupils: Pupils are equal, round, and reactive to light.  Neck:  Musculoskeletal: Neck supple.     Vascular: No JVD.     Trachea: No tracheal deviation.  Cardiovascular:     Rate and Rhythm: Normal rate and regular rhythm.     Heart sounds: Normal heart sounds. No murmur.  Pulmonary:     Effort: Pulmonary effort is normal. No tachypnea, accessory muscle usage or respiratory distress.     Breath  sounds: Normal breath sounds. No stridor. No wheezing, rhonchi or rales.  Abdominal:     General: Bowel sounds are normal. There is no distension.     Palpations: Abdomen is soft.     Tenderness: There is no abdominal tenderness.  Musculoskeletal:        General: No tenderness.  Lymphadenopathy:     Cervical: No cervical adenopathy.  Skin:    General: Skin is warm and dry.     Capillary Refill: Capillary refill takes less than 2 seconds.     Findings: No rash.  Neurological:     Mental Status: He is alert and oriented to person, place, and time.  Psychiatric:        Behavior: Behavior normal.      Vitals:   05/05/19 1540  BP: 128/70  Pulse: 67  SpO2: 98%   98% on RA BMI Readings from Last 3 Encounters:  05/04/19 23.86 kg/m  01/12/19 22.63 kg/m  12/29/18 21.47 kg/m   Wt Readings from Last 3 Encounters:  05/04/19 143 lb 6 oz (65 kg)  01/12/19 136 lb (61.7 kg)  12/29/18 133 lb (60.3 kg)     CBC    Component Value Date/Time   WBC 5.9 05/04/2019 0933   RBC 4.85 05/04/2019 0933   HGB 13.2 05/04/2019 0933   HCT 39.9 05/04/2019 0933   PLT 199.0 05/04/2019 0933   MCV 82.2 05/04/2019 0933   MCH 27.5 10/26/2018 0606   MCHC 33.0 05/04/2019 0933   RDW 17.5 (H) 05/04/2019 0933   LYMPHSABS 1.7 05/04/2019 0933   MONOABS 0.7 05/04/2019 0933   EOSABS 0.1 05/04/2019 0933   BASOSABS 0.1 05/04/2019 0933    Chest Imaging: 12/27/2018 HRCT of the chest: There is evidence of clustered centrilobular and tree-in-bud nodular opacities within the right lung and some consolidation within the right base.  With distribution was concern for potential aspiration. The patient's images have been independently reviewed by me.    03/16/2018 Clinical Impression Pt presents with functional swallow marked by adequate mastication, timely swallow response, consistent, high penetration of thin liquids (penetrate coats underside of epiglottis and is ejected upon descent of larynx - never reaches  vocal folds) - this is considered normal,  #2 on the Penetration-Aspiration Scale.  There is no residue remaining in pharynx post-swallow.  A 13 mm barium pill passed easily through esophagus.  After cessation of exam, pt began coughing, and he and his wife affirmed that this behavior was consistent with the issue he has been describing. Instructed pt to observe if he expectorated barium at any time subsequent to the MBS.  Two hours later, pt called the office to  inform SLP that he expectorated material that he described as looking like the barium he ingested.  No oropharyngeal dysphagia noted on today's exam, but pt would benefit from further esophageal work-up given his report.   SLP Visit Diagnosis Dysphagia, unspecified (R13.10)     Pulmonary Functions Testing Results: PFT Results Latest Ref Rng & Units 01/12/2019  FVC-Pre L 2.77  FVC-Predicted Pre % 87  FVC-Post L 2.75  FVC-Predicted  Post % 86  Pre FEV1/FVC % % 78  Post FEV1/FCV % % 83  FEV1-Pre L 2.16  FEV1-Predicted Pre % 97  FEV1-Post L 2.29  DLCO UNC% % 75  DLCO COR %Predicted % 81  TLC L 5.42  TLC % Predicted % 89  RV % Predicted % 103     FeNO:  Lab Results  Component Value Date   NITRICOXIDE 23 12/29/2018    Pathology: None   Echocardiogram: None   Heart Catheterization: None     Assessment & Plan:     ICD-10-CM   1. Pneumonia of right lower lobe due to infectious organism (HCC)  J18.1   2. Chronic cough  R05   3. Chronic nasal congestion  R09.81   4. Dysphagia, unspecified type  R13.10     Discussion:  This is an 84 year old gentleman with a history of right lower lobe pneumonia is seen in the hospital.  He had a swallow study back in 2019 which revealed unspecified dysphagia.  He was treated for pneumonia with follow-up imaging which showed near complete resolution.  At this time has no ongoing symptoms.  Denies cough or hemoptysis.  His biggest complaint is his chronic cough and nasal congestion.  He has  had polyps removed by ENT in the past.  I have encouraged him to follow-up with ENT to make sure that he can have this evaluated again.  He is also told to continue the use of his intranasal steroid that he has been prescribed.  He was also encouraged to have small bites and use lots of fluid with eating.  And follow recommendations that he had had gleaned before from speech-language pathology.  If his symptoms are ongoing he and he does have recurrent aspiration.  Would recommend repeat evaluation in the outpatient setting for speech-language pathology.  He may in fact need a F EES or a repeat M BSS.  Greater than 50% of this patient's 25-minute of visit was spent face-to-face discussing the recommendations and treatment plan.  We also reviewed the patient's CT images that were completed during his hospitalization and now.  We also reviewed the results of his M BSS.    Current Outpatient Medications:    acetaminophen (TYLENOL) 500 MG tablet, Take 500 mg by mouth 2 (two) times daily., Disp: , Rfl:    albuterol (PROVENTIL) (2.5 MG/3ML) 0.083% nebulizer solution, Take 3 mLs (2.5 mg total) by nebulization every 6 (six) hours as needed for wheezing or shortness of breath., Disp: 90 vial, Rfl: 3   amLODipine (NORVASC) 5 MG tablet, Take 1 tablet (5 mg total) by mouth daily. Please call and schedule appt for further refills 1st attempt, Disp: 90 tablet, Rfl: 0   aspirin EC 81 MG tablet, Take 81 mg by mouth daily., Disp: , Rfl:    atorvastatin (LIPITOR) 80 MG tablet, TAKE 1 TABLET BY MOUTH DAILY (Patient taking differently: Take 80 mg by mouth daily. ), Disp: 90 tablet, Rfl: 3   azelastine (ASTELIN) 0.1 % nasal spray, Place 1 spray into both nostrils 2 (two) times daily., Disp: 30 mL, Rfl: 5   Azelastine-Fluticasone 137-50 MCG/ACT SUSP, Place 1 puff into the nose 2 (two) times daily., Disp: 1 Bottle, Rfl: 3   carvedilol (COREG) 6.25 MG tablet, TAKE 1 TABLET BY MOUTH TWICE DAILY, Disp: 180 tablet,  Rfl: 1   cetirizine (ZYRTEC) 10 MG tablet, Take 10 mg by mouth daily., Disp: , Rfl:    Diclofenac Sodium 2 % SOLN, Place 1  application onto the skin 2 (two) times daily., Disp: , Rfl:    donepezil (ARICEPT) 10 MG tablet, TAKE 1 TABLET BY MOUTH DAILY, Disp: 90 tablet, Rfl: 0   Eyelid Cleansers (OCUSOFT EYELID CLEANSING) PADS, Place 1 application into both eyes 2 (two) times daily., Disp: , Rfl:    fish oil-omega-3 fatty acids 1000 MG capsule, Take 2 g by mouth 2 (two) times daily. , Disp: , Rfl:    fluticasone (FLONASE) 50 MCG/ACT nasal spray, 1 spray each nostril twice daily, Disp: 16 g, Rfl: 5   fluticasone furoate-vilanterol (BREO ELLIPTA) 100-25 MCG/INH AEPB, Inhale 1 puff into the lungs daily., Disp: 28 each, Rfl: 5   gabapentin (NEURONTIN) 100 MG capsule, Take 2 capsules (200 mg total) by mouth at bedtime., Disp: 180 capsule, Rfl: 1   hydrochlorothiazide (HYDRODIURIL) 25 MG tablet, TAKE 1 TABLET BY MOUTH DAILY, Disp: 90 tablet, Rfl: 0   montelukast (SINGULAIR) 10 MG tablet, Take 1 tablet (10 mg total) by mouth at bedtime., Disp: 30 tablet, Rfl: 11   pantoprazole (PROTONIX) 40 MG tablet, Take 1 tablet (40 mg total) by mouth 2 (two) times daily., Disp: 60 tablet, Rfl: 6   Polyvinyl Alcohol-Povidone (REFRESH OP), Place 1 drop into both eyes 2 (two) times daily., Disp: , Rfl:    Respiratory Therapy Supplies (FLUTTER) DEVI, 1 Device by Does not apply route daily., Disp: 1 each, Rfl: 0   Saw Palmetto 500 MG CAPS, Take 1,000 mg by mouth 2 (two) times daily. , Disp: , Rfl:    Spacer/Aero-Holding Chambers DEVI, Use as directed., Disp: 1 each, Rfl: 0   traMADol (ULTRAM) 50 MG tablet, Take 1 tablet (50 mg total) by mouth every 6 (six) hours as needed., Disp: 20 tablet, Rfl: 0   Josephine IgoBradley L Dayvion Sans, DO [facility] 05/05/2019 3:43 PM

## 2019-05-05 NOTE — Patient Instructions (Addendum)
Thank you for visiting Dr. Valeta Harms at [facility]. Today we recommend the following:  Continue your current regimen Follow up with ENT regarding your nasal congestion.  Return in about 1 year (around 05/04/2020), or if symptoms worsen or fail to improve.

## 2019-05-09 ENCOUNTER — Other Ambulatory Visit: Payer: Self-pay | Admitting: Cardiology

## 2019-05-16 DIAGNOSIS — J324 Chronic pansinusitis: Secondary | ICD-10-CM | POA: Diagnosis not present

## 2019-05-16 DIAGNOSIS — J328 Other chronic sinusitis: Secondary | ICD-10-CM | POA: Diagnosis not present

## 2019-05-17 DIAGNOSIS — N401 Enlarged prostate with lower urinary tract symptoms: Secondary | ICD-10-CM | POA: Diagnosis not present

## 2019-05-17 DIAGNOSIS — R972 Elevated prostate specific antigen [PSA]: Secondary | ICD-10-CM | POA: Diagnosis not present

## 2019-05-17 DIAGNOSIS — R3912 Poor urinary stream: Secondary | ICD-10-CM | POA: Diagnosis not present

## 2019-05-23 DIAGNOSIS — J324 Chronic pansinusitis: Secondary | ICD-10-CM | POA: Diagnosis not present

## 2019-05-31 ENCOUNTER — Encounter: Payer: Self-pay | Admitting: Physician Assistant

## 2019-05-31 ENCOUNTER — Other Ambulatory Visit: Payer: Self-pay

## 2019-05-31 ENCOUNTER — Ambulatory Visit (INDEPENDENT_AMBULATORY_CARE_PROVIDER_SITE_OTHER): Payer: Medicare Other | Admitting: Physician Assistant

## 2019-05-31 VITALS — BP 110/60 | HR 64 | Temp 97.9°F | Resp 14 | Ht 65.0 in | Wt 144.0 lb

## 2019-05-31 DIAGNOSIS — S51802A Unspecified open wound of left forearm, initial encounter: Secondary | ICD-10-CM

## 2019-05-31 DIAGNOSIS — T148XXA Other injury of unspecified body region, initial encounter: Secondary | ICD-10-CM

## 2019-05-31 DIAGNOSIS — Z23 Encounter for immunization: Secondary | ICD-10-CM

## 2019-05-31 MED ORDER — CEPHALEXIN 500 MG PO CAPS: 500.0000 mg | ORAL_CAPSULE | Freq: Two times a day (BID) | ORAL | 0 refills | Status: DC

## 2019-05-31 NOTE — Progress Notes (Signed)
Patient presents to clinic today c/o scraped skin of left anterior forearm present for 3 days.  Patient notes he was working on the roof when he slipped in his forearm dragged across the wooden ladder.  Notes top layer of skin was pulled off and hanging.  Noted bleeding that he and his wife were able to easily stop.  Notes his wife wash the wound immediately, removing the loose area of hanging skin.  Clean the area with iodine afterwards and bandage.  Notes the area has been healing pretty well up until the past 24 hours.  Notes increased redness and tenderness at the site.  Denies drainage.  Denies fever, chills, malaise or fatigue.  Last tetanus on file in 2009.  Past Medical History:  Diagnosis Date  . Allergy   . Arthritis    "scattered" (10/26/2018)  . Asthma   . C. difficile diarrhea   . CAD (coronary artery disease) 2005   s/p PCI of RCA  . Carotid artery stenosis    1-39% bilateral by dopplers 10/2016  . Carotid stenosis    "? side; Dr. Edilia Boickson"  . Cognitive impairment, mild, so stated   . Colitis   . Erectile dysfunction   . GERD (gastroesophageal reflux disease)   . Hyperlipidemia   . Hypertension   . Perforated stomach, acute 10/23/2018  . Peripheral vascular disease (HCC)   . Retinal detachment   . Vertebrobasilar artery syndrome   . Vertigo     Current Outpatient Medications on File Prior to Visit  Medication Sig Dispense Refill  . acetaminophen (TYLENOL) 500 MG tablet Take 500 mg by mouth 2 (two) times daily.    Marland Kitchen. albuterol (PROVENTIL) (2.5 MG/3ML) 0.083% nebulizer solution Take 3 mLs (2.5 mg total) by nebulization every 6 (six) hours as needed for wheezing or shortness of breath. 90 vial 3  . amLODipine (NORVASC) 5 MG tablet Take 1 tablet (5 mg total) by mouth daily. Please call and schedule appt for further refills 1st attempt 90 tablet 0  . aspirin EC 81 MG tablet Take 81 mg by mouth daily.    Marland Kitchen. atorvastatin (LIPITOR) 80 MG tablet TAKE 1 TABLET BY MOUTH DAILY 90  tablet 0  . azelastine (ASTELIN) 0.1 % nasal spray Place 1 spray into both nostrils 2 (two) times daily. 30 mL 5  . Azelastine-Fluticasone 137-50 MCG/ACT SUSP Place 1 puff into the nose 2 (two) times daily. 1 Bottle 3  . carvedilol (COREG) 6.25 MG tablet TAKE 1 TABLET BY MOUTH TWICE DAILY 180 tablet 1  . cetirizine (ZYRTEC) 10 MG tablet Take 10 mg by mouth daily.    . Diclofenac Sodium 2 % SOLN Place 1 application onto the skin 2 (two) times daily.    Marland Kitchen. donepezil (ARICEPT) 10 MG tablet TAKE 1 TABLET BY MOUTH DAILY 90 tablet 0  . Eyelid Cleansers (OCUSOFT EYELID CLEANSING) PADS Place 1 application into both eyes 2 (two) times daily.    . fish oil-omega-3 fatty acids 1000 MG capsule Take 2 g by mouth 2 (two) times daily.     . fluticasone (FLONASE) 50 MCG/ACT nasal spray 1 spray each nostril twice daily 16 g 5  . fluticasone furoate-vilanterol (BREO ELLIPTA) 100-25 MCG/INH AEPB Inhale 1 puff into the lungs daily. 28 each 5  . gabapentin (NEURONTIN) 100 MG capsule Take 2 capsules (200 mg total) by mouth at bedtime. 180 capsule 1  . hydrochlorothiazide (HYDRODIURIL) 25 MG tablet TAKE 1 TABLET BY MOUTH DAILY 90 tablet 0  .  montelukast (SINGULAIR) 10 MG tablet Take 1 tablet (10 mg total) by mouth at bedtime. 30 tablet 11  . pantoprazole (PROTONIX) 40 MG tablet Take 1 tablet (40 mg total) by mouth 2 (two) times daily. 60 tablet 6  . Polyvinyl Alcohol-Povidone (REFRESH OP) Place 1 drop into both eyes 2 (two) times daily.    Marland Kitchen Respiratory Therapy Supplies (FLUTTER) DEVI 1 Device by Does not apply route daily. 1 each 0  . Saw Palmetto 500 MG CAPS Take 1,000 mg by mouth 2 (two) times daily.     Marland Kitchen Spacer/[facility] Use as directed. 1 each 0  . traMADol (ULTRAM) 50 MG tablet Take 1 tablet (50 mg total) by mouth every 6 (six) hours as needed. 20 tablet 0   No current facility-administered medications on file prior to visit.     Allergies  Allergen Reactions  . Neosporin  [Bacitracin-Polymyxin B] Other (See Comments)    Redness    Family History  Problem Relation Age of Onset  . Stroke Mother   . Hyperlipidemia Mother   . Hypertension Mother   . Deep vein thrombosis Mother   . Stroke Father   . Hyperlipidemia Father   . Hypertension Father   . Deep vein thrombosis Father   . Diverticulitis Father   . Ulcerative colitis Father   . Colon polyps Father   . Colon cancer Neg Hx   . Esophageal cancer Neg Hx   . Stomach cancer Neg Hx   . Rectal cancer Neg Hx     Social History   Socioeconomic History  . Marital status: Married    Spouse name: Not on file  . Number of children: Not on file  . Years of education: Not on file  . Highest education level: Not on file  Occupational History  . Not on file  Social Needs  . Financial resource strain: Not on file  . Food insecurity    Worry: Not on file    Inability: Not on file  . Transportation needs    Medical: Not on file    Non-medical: Not on file  Tobacco Use  . Smoking status: Former Smoker    Years: 2.00    Types: Cigars    Quit date: 05/13/1961    Years since quitting: 58.0  . Smokeless tobacco: Never Used  . Tobacco comment: "never inhaled" in the early 60's   Substance and Sexual Activity  . Alcohol use: Yes    Alcohol/week: 14.0 standard drinks    Types: 14 Glasses of wine per week  . Drug use: Never  . Sexual activity: Not Currently  Lifestyle  . Physical activity    Days per week: Not on file    Minutes per session: Not on file  . Stress: Not on file  Relationships  . Social Herbalist on phone: Not on file    Gets together: Not on file    Attends religious service: Not on file    Active member of club or organization: Not on file    Attends meetings of clubs or organizations: Not on file    Relationship status: Not on file  Other Topics Concern  . Not on file  Social History Narrative   Married with 1 son and 2 daughters.  Retired.   2 caffeinated  beverages daily, 3 glasses of wine daily   Never smoker no drug use no tobacco   Review of Systems - See HPI.  All other  ROS are negative.  BP 110/60   Pulse 64   Temp 97.9 F (36.6 C) (Skin)   Resp 14   Ht 5\' 5"  (1.651 m)   Wt 144 lb (65.3 kg)   SpO2 98%   BMI 23.96 kg/m   Physical Exam Constitutional:      Appearance: Normal appearance.  Neck:     Musculoskeletal: Neck supple.  Cardiovascular:     Rate and Rhythm: Normal rate and regular rhythm.  Pulmonary:     Effort: Pulmonary effort is normal.  Skin:      Neurological:     General: No focal deficit present.     Mental Status: He is alert and oriented to person, place, and time.  Psychiatric:        Mood and Affect: Mood normal.     Recent Results (from the past 2160 hour(s))  Lipid panel     Status: Abnormal   Collection Time: 05/04/19  9:33 AM  Result Value Ref Range   Cholesterol 109 0 - 200 mg/dL    Comment: ATP III Classification       Desirable:  < 200 mg/dL               Borderline High:  200 - 239 mg/dL          High:  > = 409240 mg/dL   Triglycerides 811.9110.0 0.0 - 149.0 mg/dL    Comment: Normal:  <147<150 mg/dLBorderline High:  150 - 199 mg/dL   HDL 82.9539.00 (L) >62.13>39.00 mg/dL   VLDL 08.622.0 0.0 - 57.840.0 mg/dL   LDL Cholesterol 48 0 - 99 mg/dL   Total CHOL/HDL Ratio 3     Comment:                Men          Women1/2 Average Risk     3.4          3.3Average Risk          5.0          4.42X Average Risk          9.6          7.13X Average Risk          15.0          11.0                       NonHDL 69.68     Comment: NOTE:  Non-HDL goal should be 30 mg/dL higher than patient's LDL goal (i.e. LDL goal of < 70 mg/dL, would have non-HDL goal of < 100 mg/dL)  Basic metabolic panel     Status: None   Collection Time: 05/04/19  9:33 AM  Result Value Ref Range   Sodium 139 135 - 145 mEq/L   Potassium 3.9 3.5 - 5.1 mEq/L   Chloride 103 96 - 112 mEq/L   CO2 27 19 - 32 mEq/L   Glucose, Bld 94 70 - 99 mg/dL   BUN 21 6 - 23  mg/dL   Creatinine, Ser 4.691.07 0.40 - 1.50 mg/dL   Calcium 9.0 8.4 - 62.910.5 mg/dL   GFR 52.8466.18 >13.24>60.00 mL/min  TSH     Status: None   Collection Time: 05/04/19  9:33 AM  Result Value Ref Range   TSH 3.29 0.35 - 4.50 uIU/mL  Hepatic function panel     Status: None   Collection Time: 05/04/19  9:33 AM  Result Value Ref Range   Total Bilirubin 0.5 0.2 - 1.2 mg/dL   Bilirubin, Direct 0.1 0.0 - 0.3 mg/dL   Alkaline Phosphatase 79 39 - 117 U/L   AST 22 0 - 37 U/L   ALT 18 0 - 53 U/L   Total Protein 6.8 6.0 - 8.3 g/dL   Albumin 4.3 3.5 - 5.2 g/dL  CBC with Differential/Platelet     Status: Abnormal   Collection Time: 05/04/19  9:33 AM  Result Value Ref Range   WBC 5.9 4.0 - 10.5 K/uL   RBC 4.85 4.22 - 5.81 Mil/uL   Hemoglobin 13.2 13.0 - 17.0 g/dL   HCT 69.639.9 29.539.0 - 28.452.0 %   MCV 82.2 78.0 - 100.0 fl   MCHC 33.0 30.0 - 36.0 g/dL   RDW 13.217.5 (H) 44.011.5 - 10.215.5 %   Platelets 199.0 150.0 - 400.0 K/uL   Neutrophils Relative % 58.0 43.0 - 77.0 %   Lymphocytes Relative 28.8 12.0 - 46.0 %   Monocytes Relative 11.1 3.0 - 12.0 %   Eosinophils Relative 1.2 0.0 - 5.0 %   Basophils Relative 0.9 0.0 - 3.0 %   Neutro Abs 3.4 1.4 - 7.7 K/uL   Lymphs Abs 1.7 0.7 - 4.0 K/uL   Monocytes Absolute 0.7 0.1 - 1.0 K/uL   Eosinophils Absolute 0.1 0.0 - 0.7 K/uL   Basophils Absolute 0.1 0.0 - 0.1 K/uL    Assessment/Plan: 1. Skin avulsion Area as noted above with mild erythema and tenderness.  No evidence of induration, fluctuance or drainage from site.  Concern for mild infection.  Tetanus updated today.  Wound was cleaned and dressed in office.  Rx Keflex 500 mg twice daily x7 days.  Home care reviewed with patient and wife.  Strict return precautions reviewed with him as well.  Patient and wife voiced understanding and agreement with plan.  - cephALEXin (KEFLEX) 500 MG capsule; Take 1 capsule (500 mg total) by mouth 2 (two) times daily for 7 days.  Dispense: 14 capsule; Refill: 0 - Td : Tetanus/diphtheria >7yo  Preservative  free   Piedad ClimesWilliam Cody Mikaya Bunner, [place] JerseyPA-C

## 2019-05-31 NOTE — Patient Instructions (Signed)
Please keep skin clean and dry: wash with clean water and a small amount of soap. Pat dry thoroughly. Can leave open to air out if just resting at home. Otherwise apply a very thin layer of vaseline over the area to help protect it. Bandage well.  Take the antibiotic twice daily as directed. Symptoms should continue to improve and resolve.  Healing will take a couple of weeks.  If you note any non-improving symptoms or anything worsens/new symptoms develop, contact us or return to clinic for reassessment.

## 2019-06-02 ENCOUNTER — Telehealth: Payer: Self-pay | Admitting: Pulmonary Disease

## 2019-06-02 DIAGNOSIS — J189 Pneumonia, unspecified organism: Secondary | ICD-10-CM

## 2019-06-02 NOTE — Telephone Encounter (Signed)
PCCM:  Yes, ok for non-contrasted CT chest for follow up pneumonia  Please setup follow up appointment with BM to review results once back   Garner Nash, [facility] 06/02/2019 6:09 PM

## 2019-06-02 NOTE — Telephone Encounter (Signed)
Called and spoke with pt's wife [name]. [name] is requesting pt to have CT repeated on pt to see if there has been any change with the pneumonia. She said that pt did go see an ENT and it showed that pt has had polyps that have grown back in sinuses and due to this, pt may have to have surgery done again. Due to this, [name] wants to know how pt's lungs are in case if surgery does need to happen again.  Dr.  Valeta Harms, please advise if you are okay with pt getting another CT performed or what you recommend. Thanks!

## 2019-06-03 NOTE — Telephone Encounter (Signed)
Called and spoke with pt's wife Vaughan Basta letting her know that Dr. Valeta Harms said that we could get pt scheduled for a CT to follow up on the pna. Vaughan Basta verbalized understanding. Stated to [place] that we would get pt in office for an appt with Aaron Edelman after CT to have results reviewed. Order has been placed for the CT. Nothing further needed.

## 2019-06-06 ENCOUNTER — Ambulatory Visit (INDEPENDENT_AMBULATORY_CARE_PROVIDER_SITE_OTHER)
Admission: RE | Admit: 2019-06-06 | Discharge: 2019-06-06 | Disposition: A | Discharge status: Home / Self Care | Payer: Medicare Other | Source: Ambulatory Visit | Attending: Pulmonary Disease | Admitting: Pulmonary Disease

## 2019-06-06 ENCOUNTER — Other Ambulatory Visit: Payer: Self-pay

## 2019-06-06 DIAGNOSIS — J181 Lobar pneumonia, unspecified organism: Secondary | ICD-10-CM | POA: Diagnosis not present

## 2019-06-06 DIAGNOSIS — J189 Pneumonia, unspecified organism: Secondary | ICD-10-CM | POA: Diagnosis not present

## 2019-06-07 ENCOUNTER — Ambulatory Visit: Payer: Medicare Other | Admitting: Primary Care

## 2019-06-07 ENCOUNTER — Encounter: Payer: Self-pay | Admitting: Primary Care

## 2019-06-07 DIAGNOSIS — R05 Cough: Secondary | ICD-10-CM

## 2019-06-07 DIAGNOSIS — J984 Other disorders of lung: Secondary | ICD-10-CM

## 2019-06-07 DIAGNOSIS — J339 Nasal polyp, unspecified: Secondary | ICD-10-CM | POA: Diagnosis not present

## 2019-06-07 DIAGNOSIS — R053 Chronic cough: Secondary | ICD-10-CM

## 2019-06-07 NOTE — Progress Notes (Signed)
PCCM: Thanks for seeing him Garner Nash, DO [facility] 06/07/2019 6:02 PM

## 2019-06-07 NOTE — Assessment & Plan Note (Signed)
-   Repeat CT chest on 8/10 showed resolution pneumonia - Continues to have chronic cough d.t sinusitis  - Reinforced importance of dysphagia diet - If patient continues to aspirate will need to refer to speech pathology and consider F EES or repeat M BSS

## 2019-06-07 NOTE — Progress Notes (Signed)
 @Patient  ID: [name], male    DOB: 18-Jun-1935, 84 y.o.   MRN: [id number]  Chief Complaint  Patient presents with  . Follow-up    F/U after CT scan. Still has a productive cough with yellow phlegm.     Referring provider: Sheliah Hatchabori, Katherine E, MD  HPI: 84 year old male, passive cigar smoker (Cigars in [place] in 1960 <2 years). PMH significant for chronic cough, CAD, HTN. Recent hospitalization in December 2019 for perforated ulcer. [facility], consulted in-patient by Dr. Marchelle Gearingamaswamy for chronic cough and wheeze. Possible underlying asthma. IgE and Allergy panel negative. CT abd 12/28 showed basal atelectasis versus ILD.  CXR 12/29 showed areas of subsegmental atelectasis or scarring in the lung bases bilaterally. Needs HRCT in 6-12 weeks.   Previous [place] Encounters: 1/20/2020[facility] follow-up Patient presents today for hospital follow-up. He is feeling well, reports noticeable improvement in cough and wheeze with nebulizer treatments twice daily. No shortness of breath. Coughing was his biggest complaint. Symptoms have slowly returned but states not nearly as bad as they were. FENO 49 today. Started on Asmanex 2 puffs twice daily and Singulair at bedtime. HRCT planned for March.   12/06/2018- Follow-up  Patient presents today for 2-3 week follow up. Accompanied by wife. Complains of nasal congestion/post nasal drip with yellow mucus. Associated cough and occasional wheezing. Symptoms are worse at night. Continues using Asmanex twice daily, flonase nasal spray and singulair. Needs prn albuterol during the afternoon, reports benefit but only last a few hours. Wife states cough was so much better but returned this week. Denies shortness of breath. HRCT planned for March.    12/30/2018- Acute visit, pneumonia Presents today for an acute visit to review recent HRCT results. Accompanied by his wife. Patient had a high resolution Chest CT scan 2 days ago that showed  extensive clustered centrilobular and tree-in-bud nodular opacity of the right lung, with consolidation of the dependent right lung base. No evidence of fibrotic interstitial lung disease. Complains of congested/productive cough. He is bringing up yellow colored mucus from his chest. Wife reports that he wakes up at night needing a breathing treatment because he can't stop coughing. Nebulizer does helps. Associated nasal congestion, patient states that he can't breath through his nose when lying down but sitting up he's fine. Denies aspiration  01/12/2019 Patient presents today for a two week follow-up visit. Accompanied by his wife. Feeling great, back to baseline. Recent HRCT showed extensive clustered centrilobular and tree-in-bud nodular opacity of the right lung with consolidation of the dependent right lung case. Findings consistent with multifocal infection, specifically aspiration given distrubution. Completed Levaquin course. Sputum culture has shown normal growth, AFB negative. Reports that his cough has been much better but still has some. He has been taking mucinex as directed. Continues Breo inhaler. FENO was previously elevated but trending down. PFTs today appear normal.   05/05/19 Follow-up, Dr. Tonia BroomsIcard  Chronic cough and nasal congestion. Swallow study back in 2019 showed unspecified dysphagia. If symptoms ongoing and recurrent aspiration recommended repeat eval speech-language pathology. May need F EES or repeat M BSS. He has had polyps removed by ENT in the past. Encouraged follow-up with ENT.  06/07/2019 Patient presents today for follow-up visit. He is doing well, continues to have chronic cough and nasal congestion. Cough is worse at night. Uses wedge pillow. Stopping taking pantoprazole because it did not seem to help his cough. Reports no GERD symptoms. CT chest on 8/10 showed clearing of pneumonia.  No acute process. He is being followed by ENT at [facility] for recurrent nasal polyps. Underwent  nasal endoscopy. Both sinus purulent material at the infundibulum. Right sided sinus culture taken and positive for staph aureus. Starting Bactrim and tobramycin with sinus irrigation. Will follow-up with them in 3-4 weeks.    Significant testing: >> FENO 12/30/18 - 23 (37) (49) >> 12/30/2018 Ambulatory O2 low 94% RA  Pulmonary function testing: >>PFTs 01/12/2019 - FVC 2.75 (86%), FEV1 2.29 (103%), ratio 83. No BD response. DLCOunc 75% >>Spirometry 11/15/2018 -FVC 2.3 (68%), FEV1 2.0 (87%), ratio 90 Labs: >>IgE 15; Eos absolute 0.1; Resp allergy panel - neg Imaging: >> 12/27/18 HRCT- extensive clustered centrilobular and tree-in-bud nodular opacity of the right lung, with consolidation of the dependent right lung base. No evidence of fibrotic interstitial lung disease. Findings are consistent with multifocal infection, and specifically aspiration given distribution. >> 10/25/19 CXR- Areas of subsegmental atelectasis or scarring in the lung bases bilaterally >> 10/23/18 CXR- Minimal bibasilar subsegmental atelectasis. >>10/23/18 CT abd/pelvis - 11 mm mean diameter nodule deep in the RIGHT LOWER LOBE adjacent to the RIGHT hemidiaphragm. Minimal dependent atelectasis posteriorly in the lower lobes.  Allergies  Allergen Reactions  . Neosporin [Bacitracin-Polymyxin B] Other (See Comments)    Redness    Immunization History  Administered Date(s) Administered  . Influenza Split 08/25/2011, 08/09/2012  . Influenza Whole 07/11/2010  . Influenza, High Dose Seasonal PF 08/14/2014, 07/16/2018  . Influenza,inj,Quad PF,6+ Mos 08/11/2013, 08/18/2014, 07/11/2015, 07/14/2016, 07/20/2017  . Pneumococcal Conjugate-13 01/25/2015  . Pneumococcal Polysaccharide-23 09/15/2002  . Td 12/13/2007, 05/31/2019  . Zoster 10/29/2010    Past Medical History:  Diagnosis Date  . Allergy   . Arthritis    "scattered" (10/26/2018)  . Asthma   . C. difficile diarrhea   . CAD (coronary artery disease) 2005   s/p PCI of  RCA  . Carotid artery stenosis    1-39% bilateral by dopplers 10/2016  . Carotid stenosis    "? side; Dr. Scot Dock"  . Cognitive impairment, mild, so stated   . Colitis   . Erectile dysfunction   . GERD (gastroesophageal reflux disease)   . Hyperlipidemia   . Hypertension   . Perforated stomach, acute 10/23/2018  . Peripheral vascular disease ([place])   . Retinal detachment   . Vertebrobasilar artery syndrome   . Vertigo     Tobacco History: Social History   Tobacco Use  Smoking Status Former Smoker  . Years: 2.00  . Types: Cigars  . Quit date: 05/13/1961  . Years since quitting: 58.1  Smokeless Tobacco Never Used  Tobacco Comment   "never inhaled" in the early 60's    Counseling given: Not Answered Comment: "never inhaled" in the early 60's    Outpatient Medications Prior to Visit  Medication Sig Dispense Refill  . acetaminophen (TYLENOL) 500 MG tablet Take 500 mg by mouth 2 (two) times daily.    Marland Kitchen albuterol (PROVENTIL) (2.5 MG/3ML) 0.083% nebulizer solution Take 3 mLs (2.5 mg total) by nebulization every 6 (six) hours as needed for wheezing or shortness of breath. 90 vial 3  . amLODipine (NORVASC) 5 MG tablet Take 1 tablet (5 mg total) by mouth daily. Please call and schedule appt for further refills 1st attempt 90 tablet 0  . aspirin EC 81 MG tablet Take 81 mg by mouth daily.    Marland Kitchen atorvastatin (LIPITOR) 80 MG tablet TAKE 1 TABLET BY MOUTH DAILY 90 tablet 0  . azelastine (ASTELIN) 0.1 % nasal spray  Place 1 spray into both nostrils 2 (two) times daily. 30 mL 5  . Azelastine-Fluticasone 137-50 MCG/ACT SUSP Place 1 puff into the nose 2 (two) times daily. 1 Bottle 3  . carvedilol (COREG) 6.25 MG tablet TAKE 1 TABLET BY MOUTH TWICE DAILY 180 tablet 1  . cetirizine (ZYRTEC) 10 MG tablet Take 10 mg by mouth daily.    . Diclofenac Sodium 2 % SOLN Place 1 application onto the skin 2 (two) times daily.    Marland Kitchen. donepezil (ARICEPT) 10 MG tablet TAKE 1 TABLET BY MOUTH DAILY 90 tablet 0  .  Eyelid Cleansers (OCUSOFT EYELID CLEANSING) PADS Place 1 application into both eyes 2 (two) times daily.    . fish oil-omega-3 fatty acids 1000 MG capsule Take 2 g by mouth 2 (two) times daily.     . fluticasone (FLONASE) 50 MCG/ACT nasal spray 1 spray each nostril twice daily 16 g 5  . fluticasone furoate-vilanterol (BREO ELLIPTA) 100-25 MCG/INH AEPB Inhale 1 puff into the lungs daily. 28 each 5  . gabapentin (NEURONTIN) 100 MG capsule Take 2 capsules (200 mg total) by mouth at bedtime. 180 capsule 1  . hydrochlorothiazide (HYDRODIURIL) 25 MG tablet TAKE 1 TABLET BY MOUTH DAILY 90 tablet 0  . montelukast (SINGULAIR) 10 MG tablet Take 1 tablet (10 mg total) by mouth at bedtime. 30 tablet 11  . pantoprazole (PROTONIX) 40 MG tablet Take 1 tablet (40 mg total) by mouth 2 (two) times daily. 60 tablet 6  . Polyvinyl Alcohol-Povidone (REFRESH OP) Place 1 drop into both eyes 2 (two) times daily.    Marland Kitchen. Respiratory Therapy Supplies (FLUTTER) DEVI 1 Device by Does not apply route daily. 1 each 0  . Saw Palmetto 500 MG CAPS Take 1,000 mg by mouth 2 (two) times daily.     Marland Kitchen. Spacer/Aero-Holding [facility] DEVI Use as directed. 1 each 0  . traMADol (ULTRAM) 50 MG tablet Take 1 tablet (50 mg total) by mouth every 6 (six) hours as needed. 20 tablet 0  . cephALEXin (KEFLEX) 500 MG capsule Take 1 capsule (500 mg total) by mouth 2 (two) times daily for 7 days. 14 capsule 0   No facility-administered medications prior to visit.     Review of Systems  Review of Systems  Constitutional: Negative.   HENT: Positive for congestion and postnasal drip.   Respiratory: Positive for cough. Negative for shortness of breath and wheezing.   Cardiovascular: Negative.    Physical Exam  BP 108/62   Pulse 63   Temp 98.1 F (36.7 C) (Oral)   Ht 5\' 5"  (1.651 m)   Wt 144 lb 12.8 oz (65.7 kg)   SpO2 96%   BMI 24.10 kg/m  Physical Exam Constitutional:      Appearance: Normal appearance.  HENT:     Head: Normocephalic  and atraumatic.     Nose: Congestion present.  Cardiovascular:     Rate and Rhythm: Normal rate.  Pulmonary:     Effort: Pulmonary effort is normal.     Breath sounds: No wheezing or rhonchi.     Comments: Congested upper airway cough Neurological:     General: No focal deficit present.     Mental Status: He is alert and oriented to person, place, and time. Mental status is at baseline.  Psychiatric:        Mood and Affect: Mood normal.        Behavior: Behavior normal.        Thought Content: Thought content  normal.        Judgment: Judgment normal.      Lab Results:  CBC    Component Value Date/Time   WBC 5.9 05/04/2019 0933   RBC 4.85 05/04/2019 0933   HGB 13.2 05/04/2019 0933   HCT 39.9 05/04/2019 0933   PLT 199.0 05/04/2019 0933   MCV 82.2 05/04/2019 0933   MCH 27.5 10/26/2018 0606   MCHC 33.0 05/04/2019 0933   RDW 17.5 (H) 05/04/2019 0933   LYMPHSABS 1.7 05/04/2019 0933   MONOABS 0.7 05/04/2019 0933   EOSABS 0.1 05/04/2019 0933   BASOSABS 0.1 05/04/2019 0933    BMET    Component Value Date/Time   NA 139 05/04/2019 0933   NA 142 04/16/2018 0948   K 3.9 05/04/2019 0933   CL 103 05/04/2019 0933   CO2 27 05/04/2019 0933   GLUCOSE 94 05/04/2019 0933   GLUCOSE 98 10/04/2010   BUN 21 05/04/2019 0933   BUN 19 04/16/2018 0948   CREATININE 1.07 05/04/2019 0933   CREATININE 0.92 03/31/2016 1051   CALCIUM 9.0 05/04/2019 0933   GFRNONAA 58 (L) 10/26/2018 0606   GFRAA >60 10/26/2018 0606    BNP No results found for: BNP  ProBNP No results found for: PROBNP  Imaging: Ct Chest Wo Contrast  Result Date: 06/06/2019 CLINICAL DATA:  Follow-up of right lower lobe pneumonia. EXAM: CT CHEST WITHOUT CONTRAST TECHNIQUE: Multidetector CT imaging of the chest was performed following the standard protocol without IV contrast. COMPARISON:  02/09/2019 FINDINGS: Cardiovascular: Aortic and branch vessel atherosclerosis. Tortuous thoracic aorta. Normal heart size, without  pericardial effusion. Multivessel coronary artery atherosclerosis. Mediastinum/Nodes: No mediastinal or definite hilar adenopathy, given limitations of unenhanced CT. Lungs/Pleura: No pleural fluid. Mild centrilobular emphysema. Interval clearing of multifocal airspace disease. 2 mm left apical pulmonary nodule on 35/3 is favored to represent a calcified granuloma and is similar. Upper Abdomen: Normal imaged portions of the liver, spleen, stomach, pancreas, adrenal glands, kidneys, gallbladder. Left-sided mild gynecomastia. Musculoskeletal: Remote posterior right rib trauma. Midthoracic spondylosis. IMPRESSION: 1. Interval clearing of pneumonia. 2. No acute process in the chest. 3. Coronary artery atherosclerosis. Aortic Atherosclerosis (ICD10-I70.0) and Emphysema (ICD10-J43.9). Electronically Signed   By: Jeronimo GreavesKyle  Talbot M.D.   On: 06/06/2019 09:09     Assessment & Plan:   Chronic cough - Repeat CT chest on 8/10 showed resolution pneumonia - Continues to have chronic cough d.t sinusitis  - Reinforced importance of dysphagia diet - If patient continues to aspirate will need to refer to speech pathology and consider F EES or repeat M BSS   Nasal polyps - [facility] - Right sided sinus culture positive for staph aureus - Started on Bactrim and tobramycin with sinus irrigation  Restrictive lung disease - Stable - Continues Breo two puffs once daily and prn albuterol neb q 6 hours - Spirometry in March 2020 showed FEV1 2.29 (103%), ratio 83    Glenford BayleyElizabeth W Sister Carbone, NP 06/07/2019

## 2019-06-07 NOTE — Assessment & Plan Note (Signed)
-   Following with ENT at [facility] for recurrent nasal polyps - Right sided sinus culture positive for staph aureus - Started on Bactrim and tobramycin with sinus irrigation

## 2019-06-07 NOTE — Patient Instructions (Addendum)
CT showed resolution of pneumonia  Continue follow up with ENT  Continue Breo 2 puffs once daily As needed albuterol nebulizer every 6 hours for breakthrough shortness of breath/wheezing  Dr. Tonia BroomsIcard recommended coming back in 8-12 months Or sooner if needed    Dysphagia Eating Plan, Bite Size Food This diet plan is for people with moderate swallowing problems who have transitioned from pureed and minced foods. Bite size foods are soft and cut into small chunks so that they can be swallowed safely. On this eating plan, you may be instructed to drink liquids that are thickened. Work with your health care provider and your diet and nutrition specialist (dietitian) to make sure that you are following the diet safely and getting all the nutrients you need. What are tips for following this plan? General guidelines for foods   You may eat foods that are tender, soft, and moist.  Always test food texture before taking a bite. Poke food with a fork or spoon to make sure it is tender.  Food should be easy to cut and shew. Avoid large pieces of food that require a lot of chewing.  Take small bites. Each bite should be smaller than your thumb nail (about 15mm by 15 mm).  If you were on pureed and minced food diet plans, you may eat any of the foods included in those diets.  Avoid foods that are very dry, hard, sticky, chewy, coarse, or crunchy.  If instructed by your health care provider, thicken liquids. Follow your health care provider's instructions for what products to use, how to do this, and to what thickness. ? Your health care provider may recommend using a commercial thickener, rice cereal, or potato flakes. Ask your health care provider to recommend thickeners. ? Thickened liquids are usually a "pudding-like" consistency, or they may be as thick as honey or thick enough to eat with a spoon. Cooking  To moisten foods, you may add liquids while you are blending, mashing, or grinding  your foods to the right consistency. These liquids include gravies, sauces, vegetable or fruit juice, milk, half and half, or water.  Strain extra liquid from foods before eating.  Reheat foods slowly to prevent a tough crust from forming.  Prepare foods in advance. Meal planning  Eat a variety of foods to get all the nutrients you need.  Some foods may be tolerated better than others. Work with your dietitian to identify which foods are safest for you to eat.  Follow your meal plan as told by your dietitian. What foods are allowed? Grains Moist breads without nuts or seeds. Biscuits, muffins, pancakes, and waffles that are well-moistened with syrup, jelly, margarine, or butter. Cooked cereals. Moist bread stuffing. Moist rice. Well-moistened cold cereal with small chunks. Well-cooked pasta, noodles, rice, and bread dressing in small pieces and thick sauce. Soft dumplings or spaetzle in small pieces and butter or gravy. Vegetables Soft, well-cooked vegetables in small pieces. Soft-cooked, mashed potatoes. Thickened vegetable juice. Fruits Canned or cooked fruits that are soft or moist and do not have skin or seeds. Fresh, soft bananas. Thickened fruit juices. Meat and other protein foods Tender, moist meats or poultry in small pieces. Moist meatballs or meatloaf. Fish without bones. Eggs or egg substitutes in small pieces. Tofu. Tempeh and meat alternatives in small pieces. Well-cooked, tender beans, peas, baked beans, and other legumes. Dairy Thickened milk. Cream cheese. Yogurt. Cottage cheese. Sour cream. Small pieces of soft cheese. Fats and oils Butter. Oils. Margarine. Mayonnaise.  Gravy. Spreads. Sweets and desserts Soft, smooth, moist desserts. Pudding. Custard. Moist cakes. Jam. Jelly. Honey. Preserves. Ask your health care provider whether you can have frozen desserts. Seasoning and other foods All seasonings and sweeteners. All sauces with small chunks. Prepared tuna, egg, or  chicken salad without raw fruits or vegetables. Moist casseroles with small, tender pieces of meat. Soups with tender meat. What foods are not allowed? Grains Coarse or dry cereals. Dry breads. Toast. Crackers. Tough, crusty breads, such as [place] bread and baguettes. Dry pancakes, waffles, and muffins. Sticky rice. Dry bread stuffing. Granola. Popcorn. Chips. Vegetables All raw vegetables. Cooked corn. Rubbery or stiff cooked vegetables. Stringy vegetables, such as celery. Tough, crisp fried potatoes. Potato skins. Fruits Hard, crunchy, stringy, high-pulp, and juicy raw fruits such as apples, pineapple, papaya, and watermelon. Small, round fruits, such as grapes. Dried fruit and fruit leather. Meat and other protein foods Large pieces of meat. Dry, tough meats, such as bacon, sausage, and hot dogs. Chicken, [place], or fish with skin and bones. Crunchy peanut butter. Nuts. Seeds. Nut and seed butters. Dairy Yogurt with nuts, seeds, or large chunks. Large chunks of cheese. Frozen desserts and milk consistency not allowed by your dietitian. Sweets and desserts Dry cakes. Chewy or dry cookies. Any desserts with nuts, seeds, dry fruits, coconut, pineapple, or anything dry, sticky, or hard. Chewy caramel. Licorice. Taffy-type candies. Ask your health care provider whether you can have frozen desserts. Seasoning and other foods Soups with tough or large chunks of meats, poultry, or vegetables. Corn or clam chowder. Smoothies with large chunks of fruit. Summary  Bite size foods can be helpful for people with moderate swallowing problems.  On the dysphagia eating plan, you may eat foods that are soft, moist, and cut into pieces smaller than 67mm by 45mm.  You may be instructed to thicken liquids. Follow your health care provider's instructions about how to do this and to what consistency. This information is not intended to replace advice given to you by your health care provider. Make sure you  discuss any questions you have with your health care provider. Document Released: 10/13/2005 Document Revised: 02/03/2019 Document Reviewed: 01/23/2017 Elsevier Patient Education  2020 [employer].

## 2019-06-07 NOTE — Assessment & Plan Note (Signed)
-   Stable - Continues Breo two puffs once daily and prn albuterol neb q 6 hours - Spirometry in March 2020 showed FEV1 2.29 (103%), ratio 83

## 2019-06-19 DIAGNOSIS — R05 Cough: Secondary | ICD-10-CM | POA: Diagnosis not present

## 2019-06-19 DIAGNOSIS — R509 Fever, unspecified: Secondary | ICD-10-CM | POA: Diagnosis not present

## 2019-06-19 DIAGNOSIS — U071 COVID-19: Secondary | ICD-10-CM | POA: Diagnosis not present

## 2019-06-24 ENCOUNTER — Other Ambulatory Visit: Payer: Self-pay | Admitting: Family Medicine

## 2019-06-24 ENCOUNTER — Other Ambulatory Visit: Payer: Self-pay

## 2019-06-24 ENCOUNTER — Ambulatory Visit (INDEPENDENT_AMBULATORY_CARE_PROVIDER_SITE_OTHER): Payer: Medicare Other

## 2019-06-24 DIAGNOSIS — Z23 Encounter for immunization: Secondary | ICD-10-CM

## 2019-06-28 DIAGNOSIS — H2513 Age-related nuclear cataract, bilateral: Secondary | ICD-10-CM | POA: Diagnosis not present

## 2019-06-28 DIAGNOSIS — H18423 Band keratopathy, bilateral: Secondary | ICD-10-CM | POA: Diagnosis not present

## 2019-07-14 ENCOUNTER — Other Ambulatory Visit: Payer: Self-pay | Admitting: Cardiology

## 2019-07-16 ENCOUNTER — Other Ambulatory Visit: Payer: Self-pay | Admitting: Internal Medicine

## 2019-07-20 DIAGNOSIS — H2512 Age-related nuclear cataract, left eye: Secondary | ICD-10-CM | POA: Diagnosis not present

## 2019-07-29 ENCOUNTER — Other Ambulatory Visit: Payer: Self-pay | Admitting: General Practice

## 2019-07-29 NOTE — Telephone Encounter (Signed)
Last OV 05/31/19 Ventolin last filled 10/11/18 1 inhaler with 2 refills.   This is not currently on his active med list. Please advise?

## 2019-08-02 ENCOUNTER — Telehealth: Payer: Self-pay | Admitting: Pulmonary Disease

## 2019-08-02 ENCOUNTER — Telehealth: Payer: Self-pay | Admitting: Family Medicine

## 2019-08-02 MED ORDER — ALBUTEROL SULFATE HFA 108 (90 BASE) MCG/ACT IN AERS: 2.0000 | INHALATION_SPRAY | Freq: Four times a day (QID) | RESPIRATORY_TRACT | 5 refills | Status: DC | PRN

## 2019-08-02 NOTE — Telephone Encounter (Signed)
Please advise, this was denied last week.

## 2019-08-02 NOTE — Telephone Encounter (Signed)
Ventolin is Albuterol. That is fine

## 2019-08-02 NOTE — Telephone Encounter (Signed)
Per pulmonary's last night he is to use Breo and albuterol nebs PRN.  If he would like an inhaler, he needs to ask them if this is an acceptable change in therapy

## 2019-08-02 NOTE — Telephone Encounter (Signed)
Called and spoke w/ pt spouse, Vaughan Basta (on [place]). Vaughan Basta states pt needs a refill of his Ventolin rescue inhaler. According to pt's last office notes 06/07/2019 from [place], pt was instructed to take Breo and his albuterol nebulizer every 6 hours as needed for breakthrough shortness of breath/wheezing. The AVS does not state pt should be taking a Ventolin rescue inhaler.   Pt's medication history notes pt was taking Ventolin HFA from 10/11/2018 - 10/29/2018 by Dr. Birdie Riddle (PCP). This was discontinued with a rationale of "Stop Taking at Discharge."  Pt is insistent that Beth instructed pt to take Breo and Ventolin despite me reinforcing the AVS instructions. I let her know I would get a message routed to [facility] for f/u. Pt expressed understanding.   Beth, please advise if pt should be prescribed Ventolin inhaler and/or if we can send in a prescription. Thank you.

## 2019-08-02 NOTE — Telephone Encounter (Signed)
Called pt and wife and advised of PCP recommendation that ventolin inhaler should come from pulmonology. They agreed and will call pulmonology

## 2019-08-02 NOTE — Telephone Encounter (Signed)
Called and spoke with pt spouse to let her know [name] has approved Ventolin rescue inhaler order to be placed. After verifying pt's pharmacy, I have sent the order for ventolin rescue inhaler. Pt spouse expressed understanding. Nothing further needed at this time.

## 2019-08-02 NOTE — Telephone Encounter (Signed)
Patient called in wanting to see if Ventolin could be sent in for him he said it helps with the chest congestion. Please advise.

## 2019-08-08 DIAGNOSIS — H25812 Combined forms of age-related cataract, left eye: Secondary | ICD-10-CM | POA: Diagnosis not present

## 2019-08-08 DIAGNOSIS — H2512 Age-related nuclear cataract, left eye: Secondary | ICD-10-CM | POA: Diagnosis not present

## 2019-08-08 HISTORY — PX: CATARACT EXTRACTION: SUR2

## 2019-08-09 NOTE — Progress Notes (Addendum)
[facility] Note  08/12/2019     CHIEF COMPLAINT Patient presents for Retina Evaluation   HISTORY OF PRESENT ILLNESS: [name] is a 84 y.o. male who presents to the clinic today for:   HPI    Retina Evaluation    In left eye.  This started 4 days ago.  Duration of 4 days.  Associated Symptoms Floaters, Distortion, Pain and Redness.  Context:  distance vision, mid-range vision and near vision.  Treatments tried include eye drops.  Response to treatment was no improvement.  I, the attending physician,  performed the HPI with the patient and updated documentation appropriately.          Comments    84 y/o male pt referred by Dr. Bing Plume for ret eval OS s/p cat sx on 10.12.20.  Cat sx ended up being rather complex, and took about 90 min.  On PO day 1, pts VA OS was extremely blurred, OS very red, OS very painful to the touch, and pupil OS was misshapen.  Symptoms have been constant x four days.  No change or problems reported OD.  Pt scheduled for cat sx OD, but will postpone until problems OS are addressed.  Pt reports Dr. Bing Plume tried three different IOLS OS, and on 1 day PO exam, did not see the haptics in place.  Dr. Bing Plume also concerned some lens material may be left behind as well.  Besivance QID OS Prolensa QD OS Lotemax TID OS       Last edited by Bernarda Caffey, MD on 08/12/2019 10:42 AM. (History)    pt was referred by Dr. Bing Plume for retina eval post cataract sx OS, pts wife states he is on [place], [place], [place] and Muro-[address], pt states his vision is "all blurred" and it has not improved since last visit with Dr. Bing Plume  Referring physician: Calvert Cantor, MD [address] [place],   [id number]  HISTORICAL INFORMATION:   Selected notes from the MEDICAL RECORD NUMBER Referred by Dr. Calvert Cantor for retinal eval OS - s/p cataract sx OS LEE: 10.13.20 (D. 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 LOTEMAX SM 0.38 % GEL   . Polyvinyl Alcohol-Povidone (REFRESH OP) Place 1 drop into both eyes 2 (two) times daily.  . prednisoLONE acetate (PRED FORTE) 1 % ophthalmic suspension Place 1 drop into the left eye every hour while awake.   No current facility-administered medications for this visit.  (Ophthalmic Drugs)   Current Outpatient Medications (Other)  Medication Sig  . acetaminophen (TYLENOL) 500 MG tablet Take 500 mg by mouth 2 (two) times daily.  Marland Kitchen albuterol (PROVENTIL) (2.5 MG/3ML) 0.083% nebulizer solution Take 3 mLs (2.5 mg total) by nebulization every 6 (six) hours as needed for wheezing or shortness of breath.  Marland Kitchen albuterol (VENTOLIN HFA) 108 (90 Base) MCG/ACT inhaler Inhale 2 puffs into the lungs every 6 (six) hours as needed for wheezing or shortness of breath.  Marland Kitchen amLODipine (NORVASC) 5 MG tablet Take 1 tablet (5 mg total) by mouth daily.  Marland Kitchen aspirin EC 81 MG tablet Take 81 mg by mouth daily.  Marland Kitchen azelastine (ASTELIN) 0.1 % nasal spray Place 1 spray into both nostrils 2 (two) times daily.  . Azelastine-Fluticasone 137-50 MCG/ACT SUSP Place 1 puff into the nose 2 (two) times daily.  Marland Kitchen BREO  ELLIPTA 100-25 MCG/INH AEPB INHALE 1 PUFF INTO THE LUNGS DAILY  . carvedilol (COREG) 6.25 MG tablet TAKE 1 TABLET BY MOUTH TWICE DAILY  . cetirizine (ZYRTEC) 10 MG tablet Take 10 mg by mouth daily.  . Diclofenac Sodium 2 % SOLN Place 1 application onto the skin 2 (two) times daily.  Marland Kitchen donepezil (ARICEPT) 10 MG tablet TAKE 1 TABLET BY MOUTH DAILY  . Eyelid Cleansers (OCUSOFT EYELID CLEANSING) PADS Place 1 application into both eyes 2 (two) times daily.  . fish oil-omega-3 fatty acids 1000 MG capsule Take 2 g by mouth 2 (two) times daily.   . fluticasone (FLONASE) 50 MCG/ACT nasal spray 1 spray each nostril twice daily  . gabapentin (NEURONTIN) 100 MG capsule Take 2 capsules (200 mg  total) by mouth at bedtime.  . hydrochlorothiazide (HYDRODIURIL) 25 MG tablet TAKE 1 TABLET BY MOUTH DAILY  . montelukast (SINGULAIR) 10 MG tablet Take 1 tablet (10 mg total) by mouth at bedtime.  Marland Kitchen Respiratory Therapy Supplies (FLUTTER) DEVI 1 Device by Does not apply route daily.  . Saw Palmetto 500 MG CAPS Take 1,000 mg by mouth 2 (two) times daily.   Marland Kitchen Spacer/Aero-Holding [facility] Use as directed.  . traMADol (ULTRAM) 50 MG tablet Take 1 tablet (50 mg total) by mouth every 6 (six) hours as needed.  Marland Kitchen atorvastatin (LIPITOR) 80 MG tablet TAKE 1 TABLET BY MOUTH DAILY   No current facility-administered medications for this visit.  (Other)      REVIEW OF SYSTEMS: ROS    Positive for: Musculoskeletal, Cardiovascular   Negative for: Constitutional, Gastrointestinal, Neurological, Skin, Genitourinary, HENT, Endocrine, Eyes, Respiratory, Psychiatric, Allergic/Imm, Heme/Lymph   Last edited by Matthew Folks, COA on 08/12/2019  9:46 AM. (History)       ALLERGIES Allergies  Allergen Reactions  . Neosporin [Bacitracin-Polymyxin B] Other (See Comments)    Redness    PAST MEDICAL HISTORY Past Medical History:  Diagnosis Date  . Allergy   . Arthritis    "scattered" (10/26/2018)  . Asthma   . C. difficile diarrhea   . CAD (coronary artery disease) 2005   s/p PCI of RCA  . Carotid artery stenosis    1-39% bilateral by dopplers 10/2016  . Carotid stenosis    "? side; Dr. Scot Dock"  . Cataract    NS OD  . Cognitive impairment, mild, so stated   . Colitis   . Erectile dysfunction   . GERD (gastroesophageal reflux disease)   . Hyperlipidemia   . Hypertension   . Perforated stomach, acute 10/23/2018  . Peripheral vascular disease ([place])   . Retinal detachment   . Vertebrobasilar artery syndrome   . Vertigo    Past Surgical History:  Procedure Laterality Date  . APPENDECTOMY  1954  . CATARACT EXTRACTION Left 08/08/2019   Dr. Bing Plume  . COLONOSCOPY    .  ESOPHAGOGASTRODUODENOSCOPY (EGD) WITH ESOPHAGEAL DILATION  09/2018  . EYE SURGERY    . LAPAROTOMY N/A 10/23/2018   Procedure: EXPLORATORY LAPAROTOMY WITH GRAHAM PATCH FOR PERFORATED ULCER AND PLACEMENT OF GASTRIC TUBE;  Surgeon: Rolm Bookbinder, MD;  Location: [facility];  Service: General;  Laterality: N/A;  . NASAL SINUS SURGERY  05/2018  . PR VEIN BYPASS GRAFT,AORTO-FEM-POP  2007  . RETINAL DETACHMENT SURGERY     "? side"  . UPPER GASTROINTESTINAL ENDOSCOPY      FAMILY HISTORY Family History  Problem Relation Age of Onset  . Stroke Mother   . Hyperlipidemia Mother   .  Hypertension Mother   . Deep vein thrombosis Mother   . Stroke Father   . Hyperlipidemia Father   . Hypertension Father   . Deep vein thrombosis Father   . Diverticulitis Father   . Ulcerative colitis Father   . Colon polyps Father   . Colon cancer Neg Hx   . Esophageal cancer Neg Hx   . Stomach cancer Neg Hx   . Rectal cancer Neg Hx     SOCIAL HISTORY Social History   Tobacco Use  . Smoking status: Former Smoker    Years: 2.00    Types: Cigars    Quit date: 05/13/1961    Years since quitting: 58.2  . Smokeless tobacco: Never Used  . Tobacco comment: "never inhaled" in the early 60's   Substance Use Topics  . Alcohol use: Yes    Alcohol/week: 14.0 standard drinks    Types: 14 Glasses of wine per week  . Drug use: Never         OPHTHALMIC EXAM:  Base Eye Exam    Visual Acuity (Snellen - Linear)      Right Left   Dist [place] 20/40 HM   Dist ph [place] 20/25 - NI       Tonometry (Tonopen, 9:53 AM)      Right Left   Pressure 17 4  IOP OS double checked.  Same result each time.       Pupils      Dark Light Shape React APD   Right 2 2 Round Minimal None   Left 4 4 Irregular Minimal None       Visual Fields (Counting fingers)      Left Right     Full   Restrictions Total superior temporal, inferior temporal, superior nasal, inferior nasal deficiencies        Extraocular Movement       Right Left    Full, Ortho Full, Ortho       Neuro/Psych    Oriented x3: Yes   Mood/Affect: Normal       Dilation    Both eyes: 1.0% Mydriacyl, 2.5% Phenylephrine @ 9:53 AM        Slit Lamp and Fundus Exam    Slit Lamp Exam      Right Left   Lids/Lashes Dermatochalasis - upper lid Dermatochalasis - upper lid   Conjunctiva/Sclera Nasal and temporal Pinguecula 360 Subconjunctival hemorrhage   Cornea Early bank K at 0300/0900, Arcus, 2+ Punctate epithelial erosions 2+ Punctate epithelial erosions, 3+ Descemet's folds, large temporal cataract would with 3 nylon sutures, inferior nylon sutures is loose, cataract wound is seidel negative   Anterior Chamber Deep and quiet deep, less than 49m layered hyphema, 4+RBC's, AC IOL settled posteriorly   Iris Round and moderately dilated to 586mfocal TID at 0400, Irregular pupil, ACIOL abutting iris   Lens 3+ Nuclear sclerosis with brunescence, 2+ Cortical cataract ACIOL posteriorly displaced, abutting iris   Vitreous Vitreous syneresis hazy view       Fundus Exam      Right Left   Disc Pink and Sharp hazy view   C/D Ratio 0.3    Macula Flat, Blunted foveal reflex, mild RPE mottling and clumping, No heme or edema hazy view   Vessels Vascular attenuation hazy view   Periphery Attached, No heme  hazy view, nasal choroidal effusion        Refraction    Manifest Refraction      Sphere Cylinder Axis Dist VA  Right +0.75 +0.25 180 20/40   Left      Unable to improve VA OS w/lenses.          IMAGING AND PROCEDURES  Imaging and Procedures for @TODAY @  OCT, Retina - OU - Both Eyes       Right Eye Quality was good. Central Foveal Thickness: 273. Progression has no prior data. Findings include normal foveal contour, no IRF, no SRF.   Left Eye Central Foveal Thickness: (unable).   Notes *Images captured and stored on drive  Diagnosis / Impression:  OD: NFP, No IRF/SRF OS: no view   Clinical management:  See  below  Abbreviations: NFP - Normal foveal profile. CME - cystoid macular edema. PED - pigment epithelial detachment. IRF - intraretinal fluid. SRF - subretinal fluid. EZ - ellipsoid zone. ERM - epiretinal membrane. ORA - outer retinal atrophy. ORT - outer retinal tubulation. SRHM - subretinal hyper-reflective material        B-Scan Ultrasound - OS - Left Eye       Quality was good. Findings included vitreous opacities, vitreous hemorrhage.   Notes **Images stored on drive**  Impression: OD: vitreous opacities consistent with hemorrhage; ?retained lens material inferiorly; +choroidal detachments/effusions; no obvious RT/RD or mass                 ASSESSMENT/PLAN:    ICD-10-CM   1. Pseudophakia  Z96.1   2. Hyphema of left eye  H21.02 B-Scan Ultrasound - OS - Left Eye  3. Vitreous hemorrhage of left eye (HCC)  H43.12 B-Scan Ultrasound - OS - Left Eye  4. Retinal edema  H35.81 OCT, Retina - OU - Both Eyes  5. 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 Combined forms of age-related cataract of right eye  H25.811     1-6. Complex Cataract surgery with AC IOL OS  - s/p CE w/ ACIOL on 10.12.2020  - ACIOL appears posteriorly displaced, abutting iris  - IOP 4  - large temporal wound w/ 3 nylon sutures -- seidel negative, but inferior suture is loose  - <25m layered hyphema developing  - +vitreous hemorrhage obscuring view of posterior pole -- possible retained lens material in vitreous  - +choroidal detachment noted on [facility] imaging and b-scan  - Stop lotemax and start PF q1h  - continue besivance, Prolensa and Muro-128 per Dr. dBing Plume - will follow closely  - IOP and hyphema and VH need to improve before considering PPV  - if hyphema fails to improve, may need removal of ACIOL  - f/u Monday   7. Mixed form age related cataract OD  - The symptoms of cataract, surgical options, and treatments and risks were discussed with patient.  -  discussed diagnosis and progression  - not yet visually significant  - monitor for now .  Ophthalmic Meds Ordered this visit:  Meds ordered this encounter  Medications  . prednisoLONE acetate (PRED FORTE) 1 % ophthalmic suspension    Sig: Place 1 drop into the left eye every hour while awake.    Dispense:  15 mL    Refill:  0       Return in about 3 days (around 08/15/2019) for f/u Monday, October 19, 8:15AM.  There are no Patient Instructions on file for this visit.   Explained the diagnoses, plan, and follow up with the patient and they expressed understanding.  Patient expressed understanding of the importance of  proper follow up care.   This document serves as a record of services personally performed by Gardiner Sleeper, MD, PhD. It was created on their behalf by Ernest Mallick, OA, an ophthalmic assistant. The creation of this record is the provider's dictation and/or activities during the visit.    Electronically signed by: Ernest Mallick, OA 10.13.2020 10:43 PM   Gardiner Sleeper, M.D., Ph.D. Diseases & Surgery of the Retina and Vitreous [facility]  I have reviewed the above documentation for accuracy and completeness, and I agree with the above. Gardiner Sleeper, M.D., Ph.D. 08/14/19 10:43 PM    Abbreviations: M myopia (nearsighted); A astigmatism; H hyperopia (farsighted); P presbyopia; Mrx spectacle prescription;  CTL contact lenses; OD right eye; OS left eye; OU both eyes  XT exotropia; ET esotropia; PEK punctate epithelial keratitis; PEE punctate epithelial erosions; DES dry eye syndrome; MGD meibomian gland dysfunction; ATs artificial tears; PFAT's preservative free artificial tears; [place] nuclear sclerotic cataract; PSC posterior subcapsular cataract; ERM epi-retinal membrane; PVD posterior vitreous detachment; RD retinal detachment; DM diabetes mellitus; DR diabetic retinopathy; NPDR non-proliferative diabetic retinopathy; PDR proliferative diabetic  retinopathy; CSME clinically significant macular edema; DME diabetic macular edema; dbh dot blot hemorrhages; CWS cotton wool spot; POAG primary open angle glaucoma; C/D cup-to-disc ratio; HVF humphrey visual field; GVF goldmann visual field; OCT optical coherence tomography; IOP intraocular pressure; BRVO Branch retinal vein occlusion; CRVO central retinal vein occlusion; CRAO central retinal artery occlusion; BRAO branch retinal artery occlusion; RT retinal tear; SB scleral buckle; PPV pars plana vitrectomy; VH Vitreous hemorrhage; PRP panretinal laser photocoagulation; IVK intravitreal kenalog; VMT vitreomacular traction; MH Macular hole;  NVD neovascularization of the disc; NVE neovascularization elsewhere; AREDS age related eye disease study; ARMD age related macular degeneration; POAG primary open angle glaucoma; EBMD epithelial/anterior basement membrane dystrophy; ACIOL anterior chamber intraocular lens; IOL intraocular lens; PCIOL posterior chamber intraocular lens; Phaco/IOL phacoemulsification with intraocular lens placement; [place] photorefractive keratectomy; LASIK laser assisted in situ keratomileusis; HTN hypertension; DM diabetes mellitus; COPD chronic obstructive pulmonary disease

## 2019-08-11 ENCOUNTER — Other Ambulatory Visit: Payer: Self-pay | Admitting: Cardiology

## 2019-08-12 ENCOUNTER — Encounter (INDEPENDENT_AMBULATORY_CARE_PROVIDER_SITE_OTHER): Payer: Self-pay | Admitting: Ophthalmology

## 2019-08-12 ENCOUNTER — Other Ambulatory Visit: Payer: Self-pay

## 2019-08-12 ENCOUNTER — Ambulatory Visit (INDEPENDENT_AMBULATORY_CARE_PROVIDER_SITE_OTHER): Payer: Medicare Other | Admitting: Ophthalmology

## 2019-08-12 DIAGNOSIS — H4312 Vitreous hemorrhage, left eye: Secondary | ICD-10-CM | POA: Diagnosis not present

## 2019-08-12 DIAGNOSIS — H2102 Hyphema, left eye: Secondary | ICD-10-CM

## 2019-08-12 DIAGNOSIS — Z961 Presence of intraocular lens: Secondary | ICD-10-CM | POA: Diagnosis not present

## 2019-08-12 DIAGNOSIS — H3581 Retinal edema: Secondary | ICD-10-CM | POA: Diagnosis not present

## 2019-08-12 DIAGNOSIS — H25811 Combined forms of age-related cataract, right eye: Secondary | ICD-10-CM

## 2019-08-12 DIAGNOSIS — H31402 Unspecified choroidal detachment, left eye: Secondary | ICD-10-CM

## 2019-08-12 DIAGNOSIS — H4440 Unspecified hypotony of eye: Secondary | ICD-10-CM

## 2019-08-12 MED ORDER — PREDNISOLONE ACETATE 1 % OP SUSP: 1.0000 [drp] | OPHTHALMIC | 0 refills | Status: DC

## 2019-08-15 ENCOUNTER — Other Ambulatory Visit: Payer: Self-pay

## 2019-08-15 ENCOUNTER — Encounter (INDEPENDENT_AMBULATORY_CARE_PROVIDER_SITE_OTHER): Payer: Self-pay | Admitting: Ophthalmology

## 2019-08-15 ENCOUNTER — Ambulatory Visit (INDEPENDENT_AMBULATORY_CARE_PROVIDER_SITE_OTHER): Payer: Medicare Other | Admitting: Ophthalmology

## 2019-08-15 ENCOUNTER — Telehealth: Payer: Self-pay

## 2019-08-15 DIAGNOSIS — H3581 Retinal edema: Secondary | ICD-10-CM | POA: Diagnosis not present

## 2019-08-15 DIAGNOSIS — H4312 Vitreous hemorrhage, left eye: Secondary | ICD-10-CM

## 2019-08-15 DIAGNOSIS — H2102 Hyphema, left eye: Secondary | ICD-10-CM

## 2019-08-15 DIAGNOSIS — H4440 Unspecified hypotony of eye: Secondary | ICD-10-CM | POA: Diagnosis not present

## 2019-08-15 DIAGNOSIS — H25811 Combined forms of age-related cataract, right eye: Secondary | ICD-10-CM

## 2019-08-15 DIAGNOSIS — H31402 Unspecified choroidal detachment, left eye: Secondary | ICD-10-CM | POA: Diagnosis not present

## 2019-08-15 DIAGNOSIS — Z961 Presence of intraocular lens: Secondary | ICD-10-CM

## 2019-08-15 NOTE — Telephone Encounter (Signed)
Called and advised of PCP recommendations.  

## 2019-08-15 NOTE — Progress Notes (Signed)
[facility] - Clinic Note  08/15/2019     CHIEF COMPLAINT Patient presents for Retina Follow Up   HISTORY OF PRESENT ILLNESS: [name] is a 84 y.o. male who presents to the clinic today for:   HPI    Retina Follow Up    Patient presents with  Other.  In left eye.  This started days ago.  Severity is moderate.  Duration of days.  Since onset it is stable.  I, the attending physician,  performed the HPI with the patient and updated documentation appropriately.          Comments    Patient states his vision is still very poor in his left eye.  States his vision OD is stable.  Patient had intermittent pain OS over the weekend.  Patient denies any new or worsening floaters or fol OU.       Last edited by Rennis Chris, MD on 08/15/2019 12:45 PM. (History)    pt and wife state they used the drops as directed over the weekend, pt states he has pain every once in awhile in his left eye, but not drainage, pt has an appt with Dr. Hazle Quant at 1:00 tomorrow   Referring physician: Nelson Chimes, MD [address] [place],  [place] [address]  HISTORICAL INFORMATION:   Selected notes from the MEDICAL RECORD NUMBER Referred by Dr. Nelson Chimes for retinal eval OS - s/p cataract sx OS LEE: 10.13.20 (D. 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 LOTEMAX SM 0.38 % GEL   . Polyvinyl Alcohol-Povidone (REFRESH OP) Place 1 drop into both eyes 2 (two) times daily.  . prednisoLONE acetate (PRED FORTE) 1 % ophthalmic suspension Place 1 drop into the left eye every hour while awake.   No current facility-administered medications for this visit.  (Ophthalmic Drugs)   Current Outpatient Medications (Other)  Medication Sig  . acetaminophen (TYLENOL) 500 MG tablet Take 500 mg by mouth 2 (two) times daily.  Marland Kitchen albuterol (PROVENTIL)  (2.5 MG/3ML) 0.083% nebulizer solution Take 3 mLs (2.5 mg total) by nebulization every 6 (six) hours as needed for wheezing or shortness of breath.  Marland Kitchen albuterol (VENTOLIN HFA) 108 (90 Base) MCG/ACT inhaler Inhale 2 puffs into the lungs every 6 (six) hours as needed for wheezing or shortness of breath.  Marland Kitchen amLODipine (NORVASC) 5 MG tablet Take 1 tablet (5 mg total) by mouth daily.  Marland Kitchen aspirin EC 81 MG tablet Take 81 mg by mouth daily.  Marland Kitchen atorvastatin (LIPITOR) 80 MG tablet TAKE 1 TABLET BY MOUTH DAILY  . azelastine (ASTELIN) 0.1 % nasal spray Place 1 spray into both nostrils 2 (two) times daily.  . Azelastine-Fluticasone 137-50 MCG/ACT SUSP Place 1 puff into the nose 2 (two) times daily.  Marland Kitchen BREO ELLIPTA 100-25 MCG/INH AEPB INHALE 1 PUFF INTO THE LUNGS DAILY  . carvedilol (COREG) 6.25 MG tablet TAKE 1 TABLET BY MOUTH TWICE DAILY  . cetirizine (ZYRTEC) 10 MG tablet Take 10 mg by mouth daily.  . Diclofenac Sodium 2 % SOLN Place 1 application onto the skin 2 (two) times daily.  Marland Kitchen donepezil (ARICEPT) 10 MG tablet TAKE 1 TABLET BY MOUTH DAILY  . Eyelid Cleansers (OCUSOFT EYELID CLEANSING) PADS Place 1 application into both eyes 2 (two) times daily.  . fish oil-omega-3 fatty acids 1000 MG capsule Take 2  g by mouth 2 (two) times daily.   . fluticasone (FLONASE) 50 MCG/ACT nasal spray 1 spray each nostril twice daily  . gabapentin (NEURONTIN) 100 MG capsule Take 2 capsules (200 mg total) by mouth at bedtime.  . hydrochlorothiazide (HYDRODIURIL) 25 MG tablet TAKE 1 TABLET BY MOUTH DAILY  . montelukast (SINGULAIR) 10 MG tablet Take 1 tablet (10 mg total) by mouth at bedtime.  Marland Kitchen Respiratory Therapy Supplies (FLUTTER) DEVI 1 Device by Does not apply route daily.  . Saw Palmetto 500 MG CAPS Take 1,000 mg by mouth 2 (two) times daily.   Marland Kitchen Spacer/Aero-Holding [facility] Use as directed.  . traMADol (ULTRAM) 50 MG tablet Take 1 tablet (50 mg total) by mouth every 6 (six) hours as needed.   No current  facility-administered medications for this visit.  (Other)      REVIEW OF SYSTEMS: ROS    Positive for: Musculoskeletal, Cardiovascular   Negative for: Constitutional, Gastrointestinal, Neurological, Skin, Genitourinary, HENT, Endocrine, Eyes, Respiratory, Psychiatric, Allergic/Imm, Heme/Lymph   Last edited by Doneen Poisson on 08/15/2019  8:12 AM. (History)       ALLERGIES Allergies  Allergen Reactions  . Neosporin [Bacitracin-Polymyxin B] Other (See Comments)    Redness    PAST MEDICAL HISTORY Past Medical History:  Diagnosis Date  . Allergy   . Arthritis    "scattered" (10/26/2018)  . Asthma   . C. difficile diarrhea   . CAD (coronary artery disease) 2005   s/p PCI of RCA  . Carotid artery stenosis    1-39% bilateral by dopplers 10/2016  . Carotid stenosis    "? side; Dr. Scot Dock"  . Cataract    NS OD  . Cognitive impairment, mild, so stated   . Colitis   . Erectile dysfunction   . GERD (gastroesophageal reflux disease)   . Hyperlipidemia   . Hypertension   . Perforated stomach, acute 10/23/2018  . Peripheral vascular disease ([place])   . Retinal detachment   . Vertebrobasilar artery syndrome   . Vertigo    Past Surgical History:  Procedure Laterality Date  . APPENDECTOMY  1954  . CATARACT EXTRACTION Left 08/08/2019   Dr. Bing Plume  . COLONOSCOPY    . ESOPHAGOGASTRODUODENOSCOPY (EGD) WITH ESOPHAGEAL DILATION  09/2018  . EYE SURGERY    . LAPAROTOMY N/A 10/23/2018   Procedure: EXPLORATORY LAPAROTOMY WITH GRAHAM PATCH FOR PERFORATED ULCER AND PLACEMENT OF GASTRIC TUBE;  Surgeon: Rolm Bookbinder, MD;  Location: [facility];  Service: General;  Laterality: N/A;  . NASAL SINUS SURGERY  05/2018  . PR VEIN BYPASS GRAFT,AORTO-FEM-POP  2007  . RETINAL DETACHMENT SURGERY     "? side"  . UPPER GASTROINTESTINAL ENDOSCOPY      FAMILY HISTORY Family History  Problem Relation Age of Onset  . Stroke Mother   . Hyperlipidemia Mother   . Hypertension Mother   . Deep  vein thrombosis Mother   . Stroke Father   . Hyperlipidemia Father   . Hypertension Father   . Deep vein thrombosis Father   . Diverticulitis Father   . Ulcerative colitis Father   . Colon polyps Father   . Colon cancer Neg Hx   . Esophageal cancer Neg Hx   . Stomach cancer Neg Hx   . Rectal cancer Neg Hx     SOCIAL HISTORY Social History   Tobacco Use  . Smoking status: Former Smoker    Years: 2.00    Types: Cigars    Quit date: 05/13/1961  Years since quitting: 58.2  . Smokeless tobacco: Never Used  . Tobacco comment: "never inhaled" in the early 60's   Substance Use Topics  . Alcohol use: Yes    Alcohol/week: 14.0 standard drinks    Types: 14 Glasses of wine per week  . Drug use: Never         OPHTHALMIC EXAM:  Base Eye Exam    Visual Acuity (Snellen - Linear)      Right Left   Dist [place] 20/40 -1 CF @ face   Dist ph [place] 20/30 +2 NI       Tonometry (Tonopen, 8:16 AM)      Right Left   Pressure 18 4       Pupils      Dark Light Shape React APD   Right 1 1 Round NR 0   Left 4 4 Irregular NR 0       Visual Fields      Left Right     Full   Restrictions Total superior temporal, inferior temporal, inferior nasal deficiencies        Extraocular Movement      Right Left    Full Full       Neuro/Psych    Oriented x3: Yes   Mood/Affect: Normal       Dilation    Both eyes: 1.0% Mydriacyl, 2.5% Phenylephrine @ 8:16 AM        Slit Lamp and Fundus Exam    Slit Lamp Exam      Right Left   Lids/Lashes Dermatochalasis - upper lid Dermatochalasis - upper lid   Conjunctiva/Sclera Nasal and temporal Pinguecula White and quiet   Cornea Early bank K at 0300/0900, Arcus, 2+ Punctate epithelial erosions 3+ Punctate epithelial erosions, 3+ Descemet's folds, large temporal cataract would with 3 nylon sutures and surrounding edema, inferior nylon sutures is loose, cataract wound is seidel negative   Anterior Chamber Deep and quiet deep, micro hyphema, 4+RBC's,  AC IOL settled posteriorly, mild fibrin reaction nasally   Iris Round and moderately dilated to 63mm focal TID at 0400, Irregular pupil, ACIOL abutting iris   Lens 3+ Nuclear sclerosis with brunescence, 2+ Cortical cataract ACIOL posteriorly displaced, abutting iris   Vitreous Vitreous syneresis hazy view       Fundus Exam      Right Left   Disc Pink and Sharp hazy view   C/D Ratio 0.3    Macula Flat, Blunted foveal reflex, mild RPE mottling and clumping, No heme or edema hazy view   Vessels Vascular attenuation hazy view   Periphery Attached, No heme  hazy view, 360 choroidal effusions          IMAGING AND PROCEDURES  Imaging and Procedures for @TODAY @  OCT, Retina - OU - Both Eyes       Right Eye Quality was good. Central Foveal Thickness: 272. Progression has been stable. Findings include normal foveal contour, no IRF, no SRF.   Left Eye Central Foveal Thickness: (unable).   Notes *Images captured and stored on drive  Diagnosis / Impression:  OD: NFP, No IRF/SRF OS: no view   Clinical management:  See below  Abbreviations: NFP - Normal foveal profile. CME - cystoid macular edema. PED - pigment epithelial detachment. IRF - intraretinal fluid. SRF - subretinal fluid. EZ - ellipsoid zone. ERM - epiretinal membrane. ORA - outer retinal atrophy. ORT - outer retinal tubulation. SRHM - subretinal hyper-reflective material  B-Scan Ultrasound - OS - Left Eye       Quality was good. Findings included vitreous opacities, vitreous hemorrhage, choroidal detachment.   Notes **Images stored on drive**  Impression: OD: vitreous opacities consistent with hemorrhage; ?retained lens material inferiorly; interval increase in choroidal detachments/effusions; no obvious RT/RD or mass                 ASSESSMENT/PLAN:    ICD-10-CM   1. Pseudophakia  Z96.1   2. Hyphema of left eye  H21.02   3. Vitreous hemorrhage of left eye (HCC)  H43.12 B-Scan Ultrasound - OS  - Left Eye  4. Retinal edema  H35.81 OCT, Retina - OU - Both Eyes  5. Hypotony of left eye  H44.40 B-Scan Ultrasound - OS - Left Eye  6. Choroidal detachment of left eye  H31.402 B-Scan Ultrasound - OS - Left Eye  7. Combined forms of age-related cataract of right eye  H25.811     1-6. Complex Cataract surgery with AC IOL OS  - s/p CE w/ ACIOL on 10.12.2020  - ACIOL appears posteriorly displaced, abutting iris  - IOP remains 4 despite q1h Pred-Forte  - large temporal wound w/ 3 nylon sutures -- seidel negative, but inferior suture is loose  - layered hyphema improved, but still significant microhyphema in [facility] --?fibrin rxn vs blood stained vitreous prolapsed into AC  - +vitreous hemorrhage and hyphema obscuring view of posterior pole, but now able to see 360 choroidal effusions -- possible retained lens material in vitreous  - +choroidal detachment noted on Opt[facility] imaging and b-scan -- increased from Friday's exam (now almost 360)  - continue PF q1h  - continue besivance, Prolensa and Muro-128 per Dr. Hazle Quant  - discussed findings and recommendations with pt  - discussed case with Dr. Hazle Quant  - recommend possible removal of ACIOL -- it may be source of hyphema  - recommend closing of temporal cataract wound -- suspect slow leak at inferior portion of wound -- Dr. Hazle Quant to have BCL placed today and then will see pt tomorrow as scheduled  - f/u here per Dr. Hazle Quant -- probably next week   7. Mixed form age related cataract OD  - The symptoms of cataract, surgical options, and treatments and risks were discussed with patient.  - discussed diagnosis and progression  - not yet visually significant  - monitor for now .  Ophthalmic Meds Ordered this visit:  No orders of the defined types were placed in this encounter.      Return in about 1 week (around 08/22/2019) for Dilated Exam, OCT.  There are no Patient Instructions on file for this visit.   Explained the diagnoses, plan, and follow  up with the patient and they expressed understanding.  Patient expressed understanding of the importance of proper follow up care.   This document serves as a record of services personally performed by Karie Chimera, MD, PhD. It was created on their behalf by Laurian Brim, OA, an ophthalmic assistant. The creation of this record is the provider's dictation and/or activities during the visit.    Electronically signed by: Laurian Brim, OA 10.19.2020 12:47 PM   Karie Chimera, M.D., Ph.D. Diseases & Surgery of the Retina and Vitreous [facility]  I have reviewed the above documentation for accuracy and completeness, and I agree with the above. Karie Chimera, M.D., Ph.D. 08/15/19 12:47 PM    Abbreviations: M myopia (nearsighted); A astigmatism; H hyperopia (  farsighted); P presbyopia; Mrx spectacle prescription;  CTL contact lenses; OD right eye; OS left eye; OU both eyes  XT exotropia; ET esotropia; PEK punctate epithelial keratitis; PEE punctate epithelial erosions; DES dry eye syndrome; MGD meibomian gland dysfunction; ATs artificial tears; PFAT's preservative free artificial tears; NSC nuclear sclerotic cataract; PSC posterior subcapsular cataract; ERM epi-retinal membrane; PVD posterior vitreous detachment; RD retinal detachment; DM diabetes mellitus; DR diabetic retinopathy; NPDR non-proliferative diabetic retinopathy; PDR proliferative diabetic retinopathy; CSME clinically significant macular edema; DME diabetic macular edema; dbh dot blot hemorrhages; CWS cotton wool spot; POAG primary open angle glaucoma; C/D cup-to-disc ratio; HVF humphrey visual field; GVF goldmann visual field; OCT optical coherence tomography; IOP intraocular pressure; BRVO Branch retinal vein occlusion; CRVO central retinal vein occlusion; CRAO central retinal artery occlusion; BRAO branch retinal artery occlusion; RT retinal tear; SB scleral buckle; PPV pars plana vitrectomy; VH Vitreous  hemorrhage; PRP panretinal laser photocoagulation; IVK intravitreal kenalog; VMT vitreomacular traction; MH Macular hole;  NVD neovascularization of the disc; NVE neovascularization elsewhere; AREDS age related eye disease study; ARMD age related macular degeneration; POAG primary open angle glaucoma; EBMD epithelial/anterior basement membrane dystrophy; ACIOL anterior chamber intraocular lens; IOL intraocular lens; PCIOL posterior chamber intraocular lens; Phaco/IOL phacoemulsification with intraocular lens placement; PRK photorefractive keratectomy; LASIK laser assisted in situ keratomileusis; HTN hypertension; DM diabetes mellitus; COPD chronic obstructive pulmonary disease

## 2019-08-15 NOTE — Telephone Encounter (Signed)
Ok for Prednisone but would use lower dose if possible

## 2019-08-15 NOTE — Telephone Encounter (Signed)
Nichole with Dr. Rachael Fee office [phone] calling in stating that patient had cataract surgery and he is wanting to start patient on oral steroid dose pack. Wanted OK from [place] before writing script. Please advise

## 2019-08-17 DIAGNOSIS — H31422 Serous choroidal detachment, left eye: Secondary | ICD-10-CM | POA: Diagnosis not present

## 2019-08-17 DIAGNOSIS — Z947 Corneal transplant status: Secondary | ICD-10-CM | POA: Diagnosis not present

## 2019-08-17 DIAGNOSIS — H31412 Hemorrhagic choroidal detachment, left eye: Secondary | ICD-10-CM | POA: Diagnosis not present

## 2019-08-17 DIAGNOSIS — H2620 Unspecified complicated cataract: Secondary | ICD-10-CM | POA: Diagnosis not present

## 2019-08-22 ENCOUNTER — Other Ambulatory Visit: Payer: Self-pay | Admitting: Cardiology

## 2019-08-24 ENCOUNTER — Ambulatory Visit (INDEPENDENT_AMBULATORY_CARE_PROVIDER_SITE_OTHER): Payer: Medicare Other | Admitting: Family Medicine

## 2019-08-24 ENCOUNTER — Encounter: Payer: Self-pay | Admitting: Family Medicine

## 2019-08-24 ENCOUNTER — Other Ambulatory Visit: Payer: Self-pay

## 2019-08-24 ENCOUNTER — Other Ambulatory Visit: Payer: Self-pay | Admitting: Cardiology

## 2019-08-24 DIAGNOSIS — M5431 Sciatica, right side: Secondary | ICD-10-CM | POA: Diagnosis not present

## 2019-08-24 MED ORDER — AMLODIPINE BESYLATE 5 MG PO TABS: 5.0000 mg | ORAL_TABLET | Freq: Every day | ORAL | 0 refills | Status: DC

## 2019-08-24 MED ORDER — GABAPENTIN 300 MG PO CAPS: 300.0000 mg | ORAL_CAPSULE | Freq: Three times a day (TID) | ORAL | 3 refills | Status: DC

## 2019-08-24 NOTE — Patient Instructions (Signed)
Good to see you Gabapentin 300 mg at night Tylenol 2 times a day regularly  Exercise 3 times a week See me again in 2 months

## 2019-08-24 NOTE — Assessment & Plan Note (Signed)
Still believe the patient's right leg is secondary to more of a sciatica.  No significant weakness noted today.  Patient will have increasing gabapentin to 300 mg with him having no side effects, patient can take Tylenol regularly.  Reprinted exercises again.  Follow-up again in 2 months.  Continue to have pain we may consider the possibility of x-rays of the lumbar spine, hip, as well as probably formal physical therapy.  Would like to avoid that at this time secondary to the coronavirus and patient being a high risk individual secondary to respiratory illnesses and advanced age.  Follow-up with me again in 8 weeks.  Spent  25 minutes with patient face-to-face and had greater than 50% of counseling including as described above in assessment and plan.

## 2019-08-24 NOTE — Progress Notes (Signed)
[facility] [address], [place] [address] Phone: [phone] Subjective:   [name], am serving as a scribe for Dr. Hulan Saas.  I'm seeing this patient by the request  of:    CC: Leg pain follow-up  [id number]   10/15/2018 Doing well at this time.  Encourage patient to the exercise more regular basis.  Discussed that injections could be beneficial again if necessary.  Refilled gabapentin for 1 year.  Follow-up again in 1 year as long as patient does well  Update 08/24/2019 [name] is a 84 y.o. male coming in with complaint of back pain. Patient states that he has intermittent radiating pain with [name] pattern in right leg. Is using gabapentin daily. Pain is staying the same as last visit.  Patient states that the gabapentin is not helping as much as it did previously.  Patient states that the pain is usually only at night.     Past Medical History:  Diagnosis Date  . Allergy   . Arthritis    "scattered" (10/26/2018)  . Asthma   . C. difficile diarrhea   . CAD (coronary artery disease) 2005   s/p PCI of RCA  . Carotid artery stenosis    1-39% bilateral by dopplers 10/2016  . Carotid stenosis    "? side; Dr. Scot Dock"  . Cataract    NS OD  . Cognitive impairment, mild, so stated   . Colitis   . Erectile dysfunction   . GERD (gastroesophageal reflux disease)   . Hyperlipidemia   . Hypertension   . Perforated stomach, acute 10/23/2018  . Peripheral vascular disease ([place])   . Retinal detachment   . Vertebrobasilar artery syndrome   . Vertigo    Past Surgical History:  Procedure Laterality Date  . APPENDECTOMY  1954  . CATARACT EXTRACTION Left 08/08/2019   Dr. Bing Plume  . COLONOSCOPY    . ESOPHAGOGASTRODUODENOSCOPY (EGD) WITH ESOPHAGEAL DILATION  09/2018  . EYE SURGERY    . LAPAROTOMY N/A 10/23/2018   Procedure: EXPLORATORY LAPAROTOMY WITH GRAHAM PATCH FOR PERFORATED ULCER AND PLACEMENT OF GASTRIC TUBE;  Surgeon:  Rolm Bookbinder, MD;  Location: [facility];  Service: General;  Laterality: N/A;  . NASAL SINUS SURGERY  05/2018  . PR VEIN BYPASS GRAFT,AORTO-FEM-POP  2007  . RETINAL DETACHMENT SURGERY     "? side"  . UPPER GASTROINTESTINAL ENDOSCOPY     Social History   Socioeconomic History  . Marital status: Married    Spouse name: Not on file  . Number of children: Not on file  . Years of education: Not on file  . Highest education level: Not on file  Occupational History  . Not on file  Social Needs  . Financial resource strain: Not on file  . Food insecurity    Worry: Not on file    Inability: Not on file  . Transportation needs    Medical: Not on file    Non-medical: Not on file  Tobacco Use  . Smoking status: Former Smoker    Years: 2.00    Types: Cigars    Quit date: 05/13/1961    Years since quitting: 58.3  . Smokeless tobacco: Never Used  . Tobacco comment: "never inhaled" in the early 60's   Substance and Sexual Activity  . Alcohol use: Yes    Alcohol/week: 14.0 standard drinks    Types: 14 Glasses of wine per week  . Drug use: Never  . Sexual activity:  Not Currently  Lifestyle  . Physical activity    Days per week: Not on file    Minutes per session: Not on file  . Stress: Not on file  Relationships  . Social Musicianconnections    Talks on phone: Not on file    Gets together: Not on file    Attends religious service: Not on file    Active member of club or organization: Not on file    Attends meetings of clubs or organizations: Not on file    Relationship status: Not on file  Other Topics Concern  . Not on file  Social History Narrative   Married with 1 son and 2 daughters.  Retired.   2 caffeinated beverages daily, 3 glasses of wine daily   Never smoker [name] drug use [name] tobacco   Allergies  Allergen Reactions  . Neosporin [Bacitracin-Polymyxin B] Other (See Comments)    Redness   Family History  Problem Relation Age of Onset  . Stroke Mother   . Hyperlipidemia  Mother   . Hypertension Mother   . Deep vein thrombosis Mother   . Stroke Father   . Hyperlipidemia Father   . Hypertension Father   . Deep vein thrombosis Father   . Diverticulitis Father   . Ulcerative colitis Father   . Colon polyps Father   . Colon cancer Neg Hx   . Esophageal cancer Neg Hx   . Stomach cancer Neg Hx   . Rectal cancer Neg Hx      Current Outpatient Medications (Cardiovascular):  .  amLODipine (NORVASC) 5 MG tablet, Take 1 tablet (5 mg total) by mouth daily. Marland Kitchen.  atorvastatin (LIPITOR) 80 MG tablet, TAKE 1 TABLET BY MOUTH DAILY .  carvedilol (COREG) 6.25 MG tablet, TAKE 1 TABLET BY MOUTH TWICE DAILY. .  hydrochlorothiazide (HYDRODIURIL) 25 MG tablet, TAKE 1 TABLET BY MOUTH DAILY  Current Outpatient Medications (Respiratory):  .  albuterol (PROVENTIL) (2.5 MG/3ML) 0.083% nebulizer solution, Take 3 mLs (2.5 mg total) by nebulization every 6 (six) hours as needed for wheezing or shortness of breath. Marland Kitchen.  albuterol (VENTOLIN HFA) 108 (90 Base) MCG/ACT inhaler, Inhale 2 puffs into the lungs every 6 (six) hours as needed for wheezing or shortness of breath. Marland Kitchen.  azelastine (ASTELIN) 0.1 % nasal spray, Place 1 spray into both nostrils 2 (two) times daily. .  Azelastine-Fluticasone 137-50 MCG/ACT SUSP, Place 1 puff into the nose 2 (two) times daily. Marland Kitchen.  BREO ELLIPTA 100-25 MCG/INH AEPB, INHALE 1 PUFF INTO THE LUNGS DAILY .  cetirizine (ZYRTEC) 10 MG tablet, Take 10 mg by mouth daily. .  fluticasone (FLONASE) 50 MCG/ACT nasal spray, 1 spray each nostril twice daily .  montelukast (SINGULAIR) 10 MG tablet, Take 1 tablet (10 mg total) by mouth at bedtime.  Current Outpatient Medications (Analgesics):  .  acetaminophen (TYLENOL) 500 MG tablet, Take 500 mg by mouth 2 (two) times daily. Marland Kitchen.  aspirin EC 81 MG tablet, Take 81 mg by mouth daily. .  traMADol (ULTRAM) 50 MG tablet, Take 1 tablet (50 mg total) by mouth every 6 (six) hours as needed.   Current Outpatient Medications  (Other):  Marland Kitchen.  Diclofenac Sodium 2 % SOLN, Place 1 application onto the skin 2 (two) times daily. Marland Kitchen.  donepezil (ARICEPT) 10 MG tablet, TAKE 1 TABLET BY MOUTH DAILY .  Eyelid Cleansers (OCUSOFT EYELID CLEANSING) PADS, Place 1 application into both eyes 2 (two) times daily. .  fish oil-omega-3 fatty acids 1000 MG capsule, Take  2 g by mouth 2 (two) times daily.  Marland Kitchen  gabapentin (NEURONTIN) 100 MG capsule, Take 2 capsules (200 mg total) by mouth at bedtime. .  LOTEMAX SM 0.38 % GEL,  .  Polyvinyl Alcohol-Povidone (REFRESH OP), Place 1 drop into both eyes 2 (two) times daily. .  prednisoLONE acetate (PRED FORTE) 1 % ophthalmic suspension, Place 1 drop into the left eye every hour while awake. Marland Kitchen  Respiratory Therapy Supplies (FLUTTER) DEVI, 1 Device by Does not apply route daily. .  Saw Palmetto 500 MG CAPS, Take 1,000 mg by mouth 2 (two) times daily.  Marland Kitchen  Spacer/Aero-Holding Rudean Curt, Use as directed. .  gabapentin (NEURONTIN) 300 MG capsule, Take 1 capsule (300 mg total) by mouth 3 (three) times daily.    Past medical history, social, surgical and family history all reviewed in electronic medical record.  [name] pertanent information unless stated regarding to the chief complaint.   Review of Systems:  [name] headache, visual changes, nausea, vomiting, diarrhea, constipation, dizziness, abdominal pain, skin rash, fevers, chills, night sweats, weight loss, swollen lymph nodes, body aches, joint swelling, chest pain, shortness of breath, mood changes.  Positive muscle aches  Objective  Blood pressure 124/60, pulse 73, height 5\' 5"  (1.651 m), weight 133 lb (60.3 kg), SpO2 96 %.    General: [name] apparent distress alert and mood and affect normal, dressed appropriately.  Mild cognitive deficiency noted HEENT: Pupils equal, extraocular movements intact  Respiratory: Patient's speak in full sentences and does not appear short of breath  Cardiovascular: [name] lower extremity edema, non tender, [name] erythema  Skin:  Warm dry intact with [name] signs of infection or rash on extremities or on axial skeleton.  Abdomen: Soft nontender poor core strength Neuro: Cranial nerves II through XII are intact, neurovascularly intact in all extremities with 2+ DTRs and 2+ pulses.  Lymph: [name] lymphadenopathy of posterior or anterior cervical chain or axillae bilaterally.  Gait normal with good balance and coordination.  MSK:  tender with full range of motion and good stability and symmetric strength and tone of shoulders, elbows, wrist, hip, knee and ankles bilaterally.  Arthritic changes of multiple joints Low back exam has significant loss of lordosis and poor core strength.  Patient has limited extension of the back of 5 degrees.  Tightness noted of the right hamstring compared to contralateral side.  Deep tendon reflexes intact, patient strength is 4+ but symmetric.     Impression and Recommendations:     This case required medical decision making of moderate complexity. The above documentation has been reviewed and is accurate and complete , DO       Note: This dictation was prepared with Dragon dictation along with smaller phrase technology. Any transcriptional errors that result from this process are unintentional.

## 2019-08-31 DIAGNOSIS — H18232 Secondary corneal edema, left eye: Secondary | ICD-10-CM | POA: Diagnosis not present

## 2019-08-31 DIAGNOSIS — H31422 Serous choroidal detachment, left eye: Secondary | ICD-10-CM | POA: Diagnosis not present

## 2019-08-31 DIAGNOSIS — H2620 Unspecified complicated cataract: Secondary | ICD-10-CM | POA: Diagnosis not present

## 2019-08-31 DIAGNOSIS — H31412 Hemorrhagic choroidal detachment, left eye: Secondary | ICD-10-CM | POA: Diagnosis not present

## 2019-09-07 ENCOUNTER — Ambulatory Visit: Payer: Medicare Other | Admitting: Cardiology

## 2019-09-07 ENCOUNTER — Other Ambulatory Visit: Payer: Self-pay

## 2019-09-07 ENCOUNTER — Encounter: Payer: Self-pay | Admitting: Cardiology

## 2019-09-07 VITALS — BP 128/52 | HR 72 | Ht 65.0 in | Wt 144.0 lb

## 2019-09-07 DIAGNOSIS — I10 Essential (primary) hypertension: Secondary | ICD-10-CM

## 2019-09-07 DIAGNOSIS — I2510 Atherosclerotic heart disease of native coronary artery without angina pectoris: Secondary | ICD-10-CM | POA: Diagnosis not present

## 2019-09-07 DIAGNOSIS — I6523 Occlusion and stenosis of bilateral carotid arteries: Secondary | ICD-10-CM

## 2019-09-07 DIAGNOSIS — E7800 Pure hypercholesterolemia, unspecified: Secondary | ICD-10-CM | POA: Diagnosis not present

## 2019-09-07 MED ORDER — AMLODIPINE BESYLATE 5 MG PO TABS: 5.0000 mg | ORAL_TABLET | Freq: Every day | ORAL | 3 refills | Status: DC

## 2019-09-07 NOTE — Progress Notes (Signed)
Cardiology Office Note:    Date:  09/07/2019   ID:  [name], DOB 01-07-1935, MRN [id number]  PCP:  Sheliah Hatch, MD  Cardiologist:  Armanda Magic, MD    Referring MD: Sheliah Hatch, MD   Chief Complaint  Patient presents with  . Coronary Artery Disease  . Hypertension  . Hyperlipidemia    History of Present Illness:    [name] is a 84 y.o. male with a hx of  ASCADs/p PCI of the RCA, HTN, Carotid artery stenosisand dyslipidemia. he is here today for followup and is doing well.  He denies any chest pain or pressure, SOB, DOE, PND, orthopnea, LE edema, dizziness, palpitations or syncope. He is compliant with his meds and is tolerating meds with no SE.    Past Medical History:  Diagnosis Date  . Allergy   . Arthritis    "scattered" (10/26/2018)  . Asthma   . C. difficile diarrhea   . CAD (coronary artery disease) 2005   s/p PCI of RCA  . Carotid artery stenosis    1-39% bilateral by dopplers 10/2016  . Carotid stenosis    "? side; Dr. Edilia Bo"  . Cataract    NS OD  . Cognitive impairment, mild, so stated   . Colitis   . Erectile dysfunction   . GERD (gastroesophageal reflux disease)   . Hyperlipidemia   . Hypertension   . Perforated stomach, acute 10/23/2018  . Peripheral vascular disease ([facility])   . Retinal detachment   . Vertebrobasilar artery syndrome   . Vertigo     Past Surgical History:  Procedure Laterality Date  . APPENDECTOMY  1954  . CATARACT EXTRACTION Left 08/08/2019   Dr. Hazle Quant  . COLONOSCOPY    . ESOPHAGOGASTRODUODENOSCOPY (EGD) WITH ESOPHAGEAL DILATION  09/2018  . EYE SURGERY    . LAPAROTOMY N/A 10/23/2018   Procedure: EXPLORATORY LAPAROTOMY WITH GRAHAM PATCH FOR PERFORATED ULCER AND PLACEMENT OF GASTRIC TUBE;  Surgeon: Emelia Loron, MD;  Location: [facility] OR;  Service: General;  Laterality: N/A;  . NASAL SINUS SURGERY  05/2018  . PR VEIN BYPASS GRAFT,AORTO-FEM-POP  2007  . RETINAL DETACHMENT SURGERY     "? side"  . UPPER  GASTROINTESTINAL ENDOSCOPY      Current Medications: Current Meds  Medication Sig  . acetaminophen (TYLENOL) 500 MG tablet Take 500 mg by mouth 2 (two) times daily.  Marland Kitchen albuterol (PROVENTIL) (2.5 MG/3ML) 0.083% nebulizer solution Take 3 mLs (2.5 mg total) by nebulization every 6 (six) hours as needed for wheezing or shortness of breath.  Marland Kitchen albuterol (VENTOLIN HFA) 108 (90 Base) MCG/ACT inhaler Inhale 2 puffs into the lungs every 6 (six) hours as needed for wheezing or shortness of breath.  Marland Kitchen amLODipine (NORVASC) 5 MG tablet Take 1 tablet (5 mg total) by mouth daily.  Marland Kitchen aspirin EC 81 MG tablet Take 81 mg by mouth daily.  Marland Kitchen atorvastatin (LIPITOR) 80 MG tablet TAKE 1 TABLET BY MOUTH DAILY  . azelastine (ASTELIN) 0.1 % nasal spray Place 1 spray into both nostrils 2 (two) times daily.  . Azelastine-Fluticasone 137-50 MCG/ACT SUSP Place 1 puff into the nose 2 (two) times daily.  Marland Kitchen BREO ELLIPTA 100-25 MCG/INH AEPB INHALE 1 PUFF INTO THE LUNGS DAILY  . carvedilol (COREG) 6.25 MG tablet TAKE 1 TABLET BY MOUTH TWICE DAILY.  . cetirizine (ZYRTEC) 10 MG tablet Take 10 mg by mouth daily.  . Diclofenac Sodium 2 % SOLN Place 1 application onto the skin 2 (two) times  daily.  . donepezil (ARICEPT) 10 MG tablet TAKE 1 TABLET BY MOUTH DAILY  . Eyelid Cleansers (OCUSOFT EYELID CLEANSING) PADS Place 1 application into both eyes 2 (two) times daily.  . fish oil-omega-3 fatty acids 1000 MG capsule Take 2 g by mouth 2 (two) times daily.   . fluticasone (FLONASE) 50 MCG/ACT nasal spray 1 spray each nostril twice daily  . gabapentin (NEURONTIN) 100 MG capsule Take 2 capsules (200 mg total) by mouth at bedtime.  . gabapentin (NEURONTIN) 300 MG capsule Take 1 capsule (300 mg total) by mouth 3 (three) times daily.  . hydrochlorothiazide (HYDRODIURIL) 25 MG tablet TAKE 1 TABLET BY MOUTH DAILY  . LOTEMAX SM 0.38 % GEL   . montelukast (SINGULAIR) 10 MG tablet Take 1 tablet (10 mg total) by mouth at bedtime.  . Polyvinyl  Alcohol-Povidone (REFRESH OP) Place 1 drop into both eyes 2 (two) times daily.  . prednisoLONE acetate (PRED FORTE) 1 % ophthalmic suspension Place 1 drop into the left eye every hour while awake.  Marland Kitchen Respiratory Therapy Supplies (FLUTTER) DEVI 1 Device by Does not apply route daily.  . Saw Palmetto 500 MG CAPS Take 1,000 mg by mouth 2 (two) times daily.   Marland Kitchen Spacer/Aero-Holding [facility] Use as directed.  . traMADol (ULTRAM) 50 MG tablet Take 1 tablet (50 mg total) by mouth every 6 (six) hours as needed.     Allergies:   Neosporin [bacitracin-polymyxin b]   Social History   Socioeconomic History  . Marital status: Married    Spouse name: Not on file  . Number of children: Not on file  . Years of education: Not on file  . Highest education level: Not on file  Occupational History  . Not on file  Social Needs  . Financial resource strain: Not on file  . Food insecurity    Worry: Not on file    Inability: Not on file  . Transportation needs    Medical: Not on file    Non-medical: Not on file  Tobacco Use  . Smoking status: Former Smoker    Years: 2.00    Types: Cigars    Quit date: 05/13/1961    Years since quitting: 58.3  . Smokeless tobacco: Never Used  . Tobacco comment: "never inhaled" in the early 60's   Substance and Sexual Activity  . Alcohol use: Yes    Alcohol/week: 14.0 standard drinks    Types: 14 Glasses of wine per week  . Drug use: Never  . Sexual activity: Not Currently  Lifestyle  . Physical activity    Days per week: Not on file    Minutes per session: Not on file  . Stress: Not on file  Relationships  . Social Herbalist on phone: Not on file    Gets together: Not on file    Attends religious service: Not on file    Active member of club or organization: Not on file    Attends meetings of clubs or organizations: Not on file    Relationship status: Not on file  Other Topics Concern  . Not on file  Social History Narrative   Married  with 1 son and 2 daughters.  Retired.   2 caffeinated beverages daily, 3 glasses of wine daily   Never smoker no drug use no tobacco     Family History: The patient's family history includes Colon polyps in his father; Deep vein thrombosis in his father and mother; Diverticulitis in  his father; Hyperlipidemia in his father and mother; Hypertension in his father and mother; Stroke in his father and mother; Ulcerative colitis in his father. There is no history of Colon cancer, Esophageal cancer, Stomach cancer, or Rectal cancer.  ROS:   Please see the history of present illness.    ROS  All other systems reviewed and negative.   EKGs/Labs/Other Studies Reviewed:    The following studies were reviewed today: none  EKG:  EKG is not ordered today.    Recent Labs: 10/28/2018: Magnesium 1.7 05/04/2019: ALT 18; BUN 21; Creatinine, Ser 1.07; Hemoglobin 13.2; Platelets 199.0; Potassium 3.9; Sodium 139; TSH 3.29   Recent Lipid Panel    Component Value Date/Time   CHOL 109 05/04/2019 0933   CHOL 112 03/31/2017 0912   TRIG 110.0 05/04/2019 0933   HDL 39.00 (L) 05/04/2019 0933   HDL 47 03/31/2017 0912   CHOLHDL 3 05/04/2019 0933   VLDL 22.0 05/04/2019 0933   LDLCALC 48 05/04/2019 0933   LDLCALC 44 03/31/2017 0912    Physical Exam:    VS:  BP (!) 128/52   Pulse 72   Ht 5\' 5"  (1.651 m)   Wt 144 lb (65.3 kg)   SpO2 94%   BMI 23.96 kg/m     Wt Readings from Last 3 Encounters:  09/07/19 144 lb (65.3 kg)  08/24/19 133 lb (60.3 kg)  06/07/19 144 lb 12.8 oz (65.7 kg)     GEN:  Well nourished, well developed in no acute distress HEENT: Normal NECK: No JVD; No carotid bruits LYMPHATICS: No lymphadenopathy CARDIAC: RRR, no murmurs, rubs, gallops RESPIRATORY:  Clear to auscultation without rales, wheezing or rhonchi  ABDOMEN: Soft, non-tender, non-distended MUSCULOSKELETAL:  No edema; No deformity  SKIN: Warm and dry NEUROLOGIC:  Alert and oriented x 3 PSYCHIATRIC:  Normal affect    ASSESSMENT:    1. Coronary artery disease involving native coronary artery of native heart without angina pectoris   2. Essential hypertension   3. Bilateral carotid artery stenosis   4. Pure hypercholesterolemia    PLAN:    In order of problems listed above:  1.  ASCAD -s/p PCI of the RCA -denies any anginal CP -continue ASA, BB, statin  2.  HTN -BP controlled -continue Carvedilol 6.25mg  BID, HCTZ 25mg  daily and amlodipine 5mg  daily -check BMET  3.  Bilateral carotid artery stenosis -dopplers 10/2018 showed 1-39% right and 40-59% left carotid stenosis -repeat dopplers 10/2019 -continue ASA and statin  4.  HLD -LDL goal < 70. -check FLp and ALt -continue Lipitor 80mg  daily   Medication Adjustments/Labs and Tests Ordered: Current medicines are reviewed at length with the patient today.  Concerns regarding medicines are outlined above.  No orders of the defined types were placed in this encounter.  No orders of the defined types were placed in this encounter.   Signed, Armanda Magicraci Turner, MD  09/07/2019 3:00 PM    [facility]

## 2019-09-07 NOTE — Patient Instructions (Signed)
Medication Instructions:   Your physician recommends that you continue on your current medications as directed. Please refer to the Current Medication list given to you today.  *If you need a refill on your cardiac medications before your next appointment, please call your pharmacy*   Lab Work:  ON Friday 09/09/2019 TO HAVE YOUR LIPIDS, ALT, AND BMET CHECKED--PLEASE COME FASTING TO THIS LAB APPOINTMENT  If you have labs (blood work) drawn today and your tests are completely normal, you will receive your results only by: Marland Kitchen MyChart Message (if you have MyChart) OR . A paper copy in the mail If you have any lab test that is abnormal or we need to change your treatment, we will call you to review the results.    Follow-Up: At [facility], you and your health needs are our priority.  As part of our continuing mission to provide you with exceptional heart care, we have created designated Provider Care Teams.  These Care Teams include your primary Cardiologist (physician) and Advanced Practice Providers (APPs -  Physician Assistants and Nurse Practitioners) who all work together to provide you with the care you need, when you need it.  Your next appointment:   12 months  The format for your next appointment:   Either In Person or Virtual  Provider:   Fransico Him, MD

## 2019-09-09 ENCOUNTER — Other Ambulatory Visit: Payer: Self-pay

## 2019-09-09 ENCOUNTER — Other Ambulatory Visit: Payer: Medicare Other | Admitting: *Deleted

## 2019-09-09 DIAGNOSIS — J324 Chronic pansinusitis: Secondary | ICD-10-CM | POA: Diagnosis not present

## 2019-09-09 DIAGNOSIS — I2510 Atherosclerotic heart disease of native coronary artery without angina pectoris: Secondary | ICD-10-CM

## 2019-09-09 DIAGNOSIS — E7800 Pure hypercholesterolemia, unspecified: Secondary | ICD-10-CM | POA: Diagnosis not present

## 2019-09-09 DIAGNOSIS — I6523 Occlusion and stenosis of bilateral carotid arteries: Secondary | ICD-10-CM

## 2019-09-09 DIAGNOSIS — I10 Essential (primary) hypertension: Secondary | ICD-10-CM

## 2019-09-09 LAB — BASIC METABOLIC PANEL
BUN/Creatinine Ratio: 8 — ABNORMAL LOW (ref 10–24)
BUN: 8 mg/dL (ref 8–27)
CO2: 25 mmol/L (ref 20–29)
Calcium: 9.9 mg/dL (ref 8.6–10.2)
Chloride: 96 mmol/L (ref 96–106)
Creatinine, Ser: 0.98 mg/dL (ref 0.76–1.27)
GFR calc Af Amer: 83 mL/min/{1.73_m2} (ref >59)
GFR calc non Af Amer: 72 mL/min/{1.73_m2} (ref >59)
Glucose: 98 mg/dL (ref 65–99)
Potassium: 3.8 mmol/L (ref 3.5–5.2)
Sodium: 138 mmol/L (ref 134–144)

## 2019-09-09 LAB — ALT: ALT: 21 IU/L (ref 0–44)

## 2019-09-09 LAB — LIPID PANEL
Chol/HDL Ratio: 5.6 ratio — ABNORMAL HIGH (ref 0.0–5.0)
Cholesterol, Total: 211 mg/dL — ABNORMAL HIGH (ref 100–199)
HDL: 38 mg/dL — ABNORMAL LOW (ref >39)
LDL Chol Calc (NIH): 140 mg/dL — ABNORMAL HIGH (ref 0–99)
Triglycerides: 182 mg/dL — ABNORMAL HIGH (ref 0–149)
VLDL Cholesterol Cal: 33 mg/dL (ref 5–40)

## 2019-09-12 ENCOUNTER — Telehealth: Payer: Self-pay

## 2019-09-12 ENCOUNTER — Telehealth: Payer: Self-pay | Admitting: Cardiology

## 2019-09-12 DIAGNOSIS — E785 Hyperlipidemia, unspecified: Secondary | ICD-10-CM

## 2019-09-12 NOTE — Addendum Note (Signed)
Addended by: Frederik Schmidt on: 09/12/2019 10:20 AM   Modules accepted: Orders

## 2019-09-12 NOTE — Telephone Encounter (Signed)
This is fine, pt is aware.

## 2019-09-12 NOTE — Telephone Encounter (Signed)
Patient called to state that Patient's wife will be attending appointment with him due to appointment being about cholesterol & his wife being the one that feeds him.Patient also has memory loss problems.

## 2019-09-12 NOTE — Telephone Encounter (Signed)
I spoke to the patient and informed him that we would reach out to him and schedule Lipid Clinic visit.  He verbalized understanding.

## 2019-09-12 NOTE — Telephone Encounter (Signed)
[name], Please schedule him an appointment in the lipid clinic so we can discuss options. Thank you

## 2019-09-12 NOTE — Telephone Encounter (Signed)
I spoke to the patient and he had fasted prior to lab draw and he is taking Lipitor 80 mg Daily.  Thank you.

## 2019-09-12 NOTE — Telephone Encounter (Signed)
-----   Message from Ramond Dial, [facility] sent at 09/12/2019  8:36 AM EST ----- Patient LDL was 48 4 months ago. Please confirm that these labs were fasting and that the patient is taking atrovastatin 80mg .

## 2019-09-18 NOTE — Progress Notes (Signed)
Patient ID: [name]                 DOB: 1935-05-30                    MRN: [id number]     HPI: [name] is a 84 y.o. male patient referred to lipid clinic by Dr. Mayford Knife. PMH is significant for hx of ASCADs/p PCI of the RCA, HTN, carotid artery stenosisand dyslipidemia. His LDL has been very stable and <70 for years. Now all the sudden LDL is 140.  Patient presents for initial appointment with lipid clinic. Patient is accompanied by his wife. They both endorse that he is taking his atorvastatin 80mg  daily and that his labs were fasting. There has been no major changes in diet. He has not been as active as he use to before COVID. He takes OTC fishoil, but has not changed brands. He has taken this for years. Patient gets leg cramps at night, but takes mustard which seems to help.  Current Medications: atorvastatin 80 mg daily Intolerances: ezetimibe, Lovaza Risk Factors: HTN, ASCAD s/p PCI, carotid artery stenosis, strong familial cardiac history LDL goal: <70 mg/dL  Diet: oatmeal, english muffin w/ jelly Banana sandwich on whole grain bread, lite pimento cheese Baked potato, lite sour cream No fried food Minium red meat, grilled pork chops Coffee w/ fat free half and half, water veggies  Exercise: none recently  Family History: father (colon polyps, DVT, diverticulitis, HLD, HTN, stroke ulcerative colitis); mother (DVT, HLD, HTN, stroke)  Social History: 3 glasses of wine daily (none lately), former smoker (quit date 05/13/1961; reported to have smoked cigars for 2 years)  Labs: 09/09/2019: TC 211 HDL 38 TG 182 VLDL 33 LDL 09/11/2019; atorvastatin 80 mg daily 05/04/2019: TC 109 HDL 39 TG 110 VLDL 22 LDL 48;  atorvastatin 80 mg daily 10/11/2018: TC 108 HDL 40.70 TG 80 VLDL 16 LDL 51;  atorvastatin 80 mg daily  Past Medical History:  Diagnosis Date  . Allergy   . Arthritis    "scattered" (10/26/2018)  . Asthma   . C. difficile diarrhea   . CAD (coronary artery disease) 2005   s/p  PCI of RCA  . Carotid artery stenosis    1-39% bilateral by dopplers 10/2016  . Carotid stenosis    "? side; Dr. 11/2016"  . Cataract    NS OD  . Cognitive impairment, mild, so stated   . Colitis   . Erectile dysfunction   . GERD (gastroesophageal reflux disease)   . Hyperlipidemia   . Hypertension   . Perforated stomach, acute 10/23/2018  . Peripheral vascular disease (HCC)   . Retinal detachment   . Vertebrobasilar artery syndrome   . Vertigo     Current Outpatient Medications on File Prior to Visit  Medication Sig Dispense Refill  . acetaminophen (TYLENOL) 500 MG tablet Take 500 mg by mouth 2 (two) times daily.    10/25/2018 albuterol (PROVENTIL) (2.5 MG/3ML) 0.083% nebulizer solution Take 3 mLs (2.5 mg total) by nebulization every 6 (six) hours as needed for wheezing or shortness of breath. 90 vial 3  . albuterol (VENTOLIN HFA) 108 (90 Base) MCG/ACT inhaler Inhale 2 puffs into the lungs every 6 (six) hours as needed for wheezing or shortness of breath. 6.7 g 5  . amLODipine (NORVASC) 5 MG tablet Take 1 tablet (5 mg total) by mouth daily. 90 tablet 3  . aspirin EC 81 MG tablet Take 81 mg by mouth daily.    Marland Kitchen  atorvastatin (LIPITOR) 80 MG tablet TAKE 1 TABLET BY MOUTH DAILY 90 tablet 0  . azelastine (ASTELIN) 0.1 % nasal spray Place 1 spray into both nostrils 2 (two) times daily. 30 mL 5  . Azelastine-Fluticasone 137-50 MCG/ACT SUSP Place 1 puff into the nose 2 (two) times daily. 1 Bottle 3  . BREO ELLIPTA 100-25 MCG/INH AEPB INHALE 1 PUFF INTO THE LUNGS DAILY 60 each 3  . carvedilol (COREG) 6.25 MG tablet TAKE 1 TABLET BY MOUTH TWICE DAILY. 180 tablet 0  . cetirizine (ZYRTEC) 10 MG tablet Take 10 mg by mouth daily.    . Diclofenac Sodium 2 % SOLN Place 1 application onto the skin 2 (two) times daily.    Marland Kitchen donepezil (ARICEPT) 10 MG tablet TAKE 1 TABLET BY MOUTH DAILY 90 tablet 0  . Eyelid Cleansers (OCUSOFT EYELID CLEANSING) PADS Place 1 application into both eyes 2 (two) times daily.    .  fish oil-omega-3 fatty acids 1000 MG capsule Take 2 g by mouth 2 (two) times daily.     . fluticasone (FLONASE) 50 MCG/ACT nasal spray 1 spray each nostril twice daily 16 g 5  . gabapentin (NEURONTIN) 100 MG capsule Take 2 capsules (200 mg total) by mouth at bedtime. 180 capsule 1  . gabapentin (NEURONTIN) 300 MG capsule Take 1 capsule (300 mg total) by mouth 3 (three) times daily. 90 capsule 3  . hydrochlorothiazide (HYDRODIURIL) 25 MG tablet TAKE 1 TABLET BY MOUTH DAILY 90 tablet 0  . LOTEMAX SM 0.38 % GEL     . montelukast (SINGULAIR) 10 MG tablet Take 1 tablet (10 mg total) by mouth at bedtime. 30 tablet 11  . Polyvinyl Alcohol-Povidone (REFRESH OP) Place 1 drop into both eyes 2 (two) times daily.    . prednisoLONE acetate (PRED FORTE) 1 % ophthalmic suspension Place 1 drop into the left eye every hour while awake. 15 mL 0  . Respiratory Therapy Supplies (FLUTTER) DEVI 1 Device by Does not apply route daily. 1 each 0  . Saw Palmetto 500 MG CAPS Take 1,000 mg by mouth 2 (two) times daily.     Marland Kitchen Spacer/Aero-Holding [facility] Use as directed. 1 each 0  . traMADol (ULTRAM) 50 MG tablet Take 1 tablet (50 mg total) by mouth every 6 (six) hours as needed. 20 tablet 0   No current facility-administered medications on file prior to visit.     Allergies  Allergen Reactions  . Neosporin [Bacitracin-Polymyxin B] Other (See Comments)    Redness    Assessment/Plan:  1. Hyperlipidemia - LDL goal < 70 mg/dL; therefore, patient is not at goal. Because of the drastic change in LDL, we have chosen to repeat the lipid panel to insure there was not a lab error. Discussed if LDL is still elevated that we would need to start a [id number]. Injection technique was reviewed. Also discussed potentially switched to rosuvastatin 40mg  as well to see if this helps with muscle cramps and possible better LDL lowering. Patient will come for repeat labs on Dec 1. Will call patient with results to discuss future plans.  Also discussed importance of exercise and encouraged him to start walking again.  Thank you for involving pharmacy to assist in providing Mr.Meidinger's care.   Ramond Dial, Pharm.D, BCPS, CPP [place]  [address], [place], [place] [address]  Phone: [phone]; Fax: [phone]

## 2019-09-19 DIAGNOSIS — T8529XA Other mechanical complication of intraocular lens, initial encounter: Secondary | ICD-10-CM | POA: Diagnosis not present

## 2019-09-20 ENCOUNTER — Other Ambulatory Visit: Payer: Self-pay

## 2019-09-20 ENCOUNTER — Ambulatory Visit (INDEPENDENT_AMBULATORY_CARE_PROVIDER_SITE_OTHER): Payer: Medicare Other | Admitting: Pharmacist

## 2019-09-20 ENCOUNTER — Encounter: Payer: Self-pay | Admitting: Pharmacist

## 2019-09-20 DIAGNOSIS — E785 Hyperlipidemia, unspecified: Secondary | ICD-10-CM | POA: Diagnosis not present

## 2019-09-20 NOTE — Patient Instructions (Addendum)
It was a pleasure to meet you today!  The plan is to repeat your cholesterol panel on Dec 1.   If your muscle cramps are too bothersome we can talk about changing to rosuvastatin 40mg  instead. If your LDL comes back high again, we can consider starting Repatha or Praluent  Any questions please call us at [phone]

## 2019-09-27 ENCOUNTER — Other Ambulatory Visit: Payer: Self-pay

## 2019-09-27 ENCOUNTER — Other Ambulatory Visit: Payer: Self-pay | Admitting: General Practice

## 2019-09-27 ENCOUNTER — Other Ambulatory Visit: Payer: Medicare Other

## 2019-09-27 DIAGNOSIS — E785 Hyperlipidemia, unspecified: Secondary | ICD-10-CM

## 2019-09-27 LAB — LIPID PANEL
Chol/HDL Ratio: 2.6 ratio (ref 0.0–5.0)
Cholesterol, Total: 89 mg/dL — ABNORMAL LOW (ref 100–199)
HDL: 34 mg/dL — ABNORMAL LOW (ref >39)
LDL Chol Calc (NIH): 34 mg/dL (ref 0–99)
Triglycerides: 112 mg/dL (ref 0–149)
VLDL Cholesterol Cal: 21 mg/dL (ref 5–40)

## 2019-09-27 MED ORDER — DONEPEZIL HCL 10 MG PO TABS: 10.0000 mg | ORAL_TABLET | Freq: Every day | ORAL | 0 refills | Status: DC

## 2019-09-28 ENCOUNTER — Telehealth: Payer: Self-pay

## 2019-09-28 ENCOUNTER — Telehealth: Payer: Self-pay | Admitting: Pharmacist

## 2019-09-28 NOTE — Telephone Encounter (Signed)
-----   Message from Sueanne Margarita, MD sent at 09/28/2019  9:25 AM EST ----- Lipids at goal continue current therapy and forward to PCP

## 2019-09-28 NOTE — Telephone Encounter (Signed)
Spoke with patient about his repeat lab results. His LDL is at goal at 34. Unsure what happened with his last lab whether it was a lab error or patient wasn't fasting when he said he was.  Patient informed of the good results. He is to continue atorvastatin 80mg . No changes needed.

## 2019-09-28 NOTE — Telephone Encounter (Signed)
Notes recorded by Frederik Schmidt, RN on 09/28/2019 at 9:38 AM EST  The patient has been notified of the result and verbalized understanding. All questions (if any) were answered.  Frederik Schmidt, RN 09/28/2019 9:38 AM

## 2019-09-30 ENCOUNTER — Other Ambulatory Visit: Payer: Self-pay | Admitting: General Practice

## 2019-09-30 MED ORDER — DONEPEZIL HCL 10 MG PO TABS: 10.0000 mg | ORAL_TABLET | Freq: Every day | ORAL | 0 refills | Status: DC

## 2019-10-06 ENCOUNTER — Other Ambulatory Visit: Payer: Self-pay | Admitting: Cardiology

## 2019-10-31 DIAGNOSIS — H25813 Combined forms of age-related cataract, bilateral: Secondary | ICD-10-CM | POA: Diagnosis not present

## 2019-11-02 ENCOUNTER — Other Ambulatory Visit: Payer: Self-pay

## 2019-11-02 ENCOUNTER — Encounter: Payer: Self-pay | Admitting: Family Medicine

## 2019-11-02 ENCOUNTER — Ambulatory Visit (INDEPENDENT_AMBULATORY_CARE_PROVIDER_SITE_OTHER): Payer: Medicare Other | Admitting: Family Medicine

## 2019-11-02 DIAGNOSIS — M48062 Spinal stenosis, lumbar region with neurogenic claudication: Secondary | ICD-10-CM | POA: Diagnosis not present

## 2019-11-02 NOTE — Patient Instructions (Signed)
Be safe

## 2019-11-02 NOTE — Assessment & Plan Note (Signed)
Discussed which activities to do which wants to avoid.  Patient is to increase activity slowly over the course the next several days.  Discussed icing regimen and home exercises.  Patient is doing really well as long as patient does well follow-up as needed in 1 patient to avoid this as long as doing well secondary to the coronavirus outbreak.

## 2019-11-02 NOTE — Progress Notes (Signed)
[name] [address] [place] [address] Phone: [phone] Subjective:   [name], am serving as a scribe for Dr. Antoine Primas. This visit occurred during the SARS-CoV-2 public health emergency.  Safety protocols were in place, including screening questions prior to the visit, additional usage of staff PPE, and extensive cleaning of exam room while observing appropriate contact time as indicated for disinfecting solutions.     CC: Low back pain follow-up  HYI:FOYDXAJOIN   08/24/2019 significant weakness noted today.  Patient will have increasing gabapentin to 300 mg with him having no side effects, patient can take Tylenol regularly.  Reprinted exercises again.  Follow-up again in 2 months.  Continue to have pain we may consider the possibility of x-rays of the lumbar spine, hip, as well as probably formal physical therapy.  Would like to avoid that at this time secondary to the coronavirus and patient being a high risk individual secondary to respiratory illnesses and advanced age.  Follow-up with me again in 8 weeks.  Spent  25 minutes with patient face-to-face and had greater than 50% of counseling including as described above in assessment and plan.  Update 11/02/2019 [name] is a 85 y.o. male coming in with complaint of back pain. Patient states that he has occasional pain in the right hip. Is using 300mg  of gabapetin at night.  Patient does states that he is feeling significantly better at this time.  Not really 100% better with no significant pain.  No side effects to the higher dose of the gabapentin at this time.    Past Medical History:  Diagnosis Date  . Allergy   . Arthritis    "scattered" (10/26/2018)  . Asthma   . C. difficile diarrhea   . CAD (coronary artery disease) 2005   s/p PCI of RCA  . Carotid artery stenosis    1-39% bilateral by dopplers 10/2016  . Carotid stenosis    "? side; Dr. 11/2016"  . Cataract    NS  OD  . Cognitive impairment, mild, so stated   . Colitis   . Erectile dysfunction   . GERD (gastroesophageal reflux disease)   . Hyperlipidemia   . Hypertension   . Perforated stomach, acute 10/23/2018  . Peripheral vascular disease (HCC)   . Retinal detachment   . Vertebrobasilar artery syndrome   . Vertigo    Past Surgical History:  Procedure Laterality Date  . APPENDECTOMY  1954  . CATARACT EXTRACTION Left 08/08/2019   Dr. 10/08/2019  . COLONOSCOPY    . ESOPHAGOGASTRODUODENOSCOPY (EGD) WITH ESOPHAGEAL DILATION  09/2018  . EYE SURGERY    . LAPAROTOMY N/A 10/23/2018   Procedure: EXPLORATORY LAPAROTOMY WITH GRAHAM PATCH FOR PERFORATED ULCER AND PLACEMENT OF GASTRIC TUBE;  Surgeon: 10/25/2018, MD;  Location: [facility] OR;  Service: General;  Laterality: N/A;  . NASAL SINUS SURGERY  05/2018  . PR VEIN BYPASS GRAFT,AORTO-FEM-POP  2007  . RETINAL DETACHMENT SURGERY     "? side"  . UPPER GASTROINTESTINAL ENDOSCOPY     Social History   Socioeconomic History  . Marital status: Married    Spouse name: Not on file  . Number of children: Not on file  . Years of education: Not on file  . Highest education level: Not on file  Occupational History  . Not on file  Tobacco Use  . Smoking status: Former Smoker    Years: 2.00    Types: Cigars    Quit  date: 05/13/1961    Years since quitting: 58.5  . Smokeless tobacco: Never Used  . Tobacco comment: "never inhaled" in the early 60's   Substance and Sexual Activity  . Alcohol use: Yes    Alcohol/week: 14.0 standard drinks    Types: 14 Glasses of wine per week  . Drug use: Never  . Sexual activity: Not Currently  Other Topics Concern  . Not on file  Social History Narrative   Married with 1 son and 2 daughters.  Retired.   2 caffeinated beverages daily, 3 glasses of wine daily   Never smoker no drug use no tobacco   Social Determinants of Health   Financial Resource Strain:   . Difficulty of Paying Living Expenses: Not on file   Food Insecurity:   . Worried About Charity fundraiser in the Last Year: Not on file  . Ran Out of Food in the Last Year: Not on file  Transportation Needs:   . Lack of Transportation (Medical): Not on file  . Lack of Transportation (Non-Medical): Not on file  Physical Activity:   . Days of Exercise per Week: Not on file  . Minutes of Exercise per Session: Not on file  Stress:   . Feeling of Stress : Not on file  Social Connections:   . Frequency of Communication with Friends and Family: Not on file  . Frequency of Social Gatherings with Friends and Family: Not on file  . Attends Religious Services: Not on file  . Active Member of Clubs or Organizations: Not on file  . Attends Archivist Meetings: Not on file  . Marital Status: Not on file   Allergies  Allergen Reactions  . Neosporin [Bacitracin-Polymyxin B] Other (See Comments)    Redness   Family History  Problem Relation Age of Onset  . Stroke Mother   . Hyperlipidemia Mother   . Hypertension Mother   . Deep vein thrombosis Mother   . Stroke Father   . Hyperlipidemia Father   . Hypertension Father   . Deep vein thrombosis Father   . Diverticulitis Father   . Ulcerative colitis Father   . Colon polyps Father   . Colon cancer Neg Hx   . Esophageal cancer Neg Hx   . Stomach cancer Neg Hx   . Rectal cancer Neg Hx      Current Outpatient Medications (Cardiovascular):  .  amLODipine (NORVASC) 5 MG tablet, Take 1 tablet (5 mg total) by mouth daily. Marland Kitchen  atorvastatin (LIPITOR) 80 MG tablet, TAKE 1 TABLET BY MOUTH DAILY .  carvedilol (COREG) 6.25 MG tablet, TAKE 1 TABLET BY MOUTH TWICE DAILY. .  hydrochlorothiazide (HYDRODIURIL) 25 MG tablet, TAKE 1 TABLET BY MOUTH DAILY  Current Outpatient Medications (Respiratory):  .  albuterol (PROVENTIL) (2.5 MG/3ML) 0.083% nebulizer solution, Take 3 mLs (2.5 mg total) by nebulization every 6 (six) hours as needed for wheezing or shortness of breath. Marland Kitchen  albuterol (VENTOLIN  HFA) 108 (90 Base) MCG/ACT inhaler, Inhale 2 puffs into the lungs every 6 (six) hours as needed for wheezing or shortness of breath. Marland Kitchen  azelastine (ASTELIN) 0.1 % nasal spray, Place 1 spray into both nostrils 2 (two) times daily. Marland Kitchen  BREO ELLIPTA 100-25 MCG/INH AEPB, INHALE 1 PUFF INTO THE LUNGS DAILY .  cetirizine (ZYRTEC) 10 MG tablet, Take 10 mg by mouth daily. .  fluticasone (FLONASE) 50 MCG/ACT nasal spray, 1 spray each nostril twice daily .  montelukast (SINGULAIR) 10 MG tablet, Take 1  tablet (10 mg total) by mouth at bedtime.  Current Outpatient Medications (Analgesics):  .  acetaminophen (TYLENOL) 500 MG tablet, Take 500 mg by mouth 2 (two) times daily. Marland Kitchen  aspirin EC 81 MG tablet, Take 81 mg by mouth daily.   Current Outpatient Medications (Other):  .  cholecalciferol (VITAMIN D3) 25 MCG (1000 UT) tablet, Take 1,000 Units by mouth daily. Marland Kitchen  donepezil (ARICEPT) 10 MG tablet, Take 1 tablet (10 mg total) by mouth daily. .  Eyelid Cleansers (OCUSOFT EYELID CLEANSING) PADS, Place 1 application into both eyes 2 (two) times daily. .  fish oil-omega-3 fatty acids 1000 MG capsule, Take 2 g by mouth 2 (two) times daily.  Marland Kitchen  gabapentin (NEURONTIN) 100 MG capsule, Take 2 capsules (200 mg total) by mouth at bedtime. .  gabapentin (NEURONTIN) 300 MG capsule, Take 1 capsule (300 mg total) by mouth 3 (three) times daily. Marland Kitchen  LOTEMAX SM 0.38 % GEL,  .  Polyvinyl Alcohol-Povidone (REFRESH OP), Place 1 drop into both eyes 2 (two) times daily. .  prednisoLONE acetate (PRED FORTE) 1 % ophthalmic suspension, Place 1 drop into the left eye every hour while awake. Marland Kitchen  Respiratory Therapy Supplies (FLUTTER) DEVI, 1 Device by Does not apply route daily. .  Saw Palmetto 500 MG CAPS, Take 1,000 mg by mouth 2 (two) times daily.  Marland Kitchen  Spacer/Aero-Holding [place] Curt, Use as directed.    Past medical history, social, surgical and family history all reviewed in electronic medical record.  No pertanent information  unless stated regarding to the chief complaint.   Review of Systems:  No headache, visual changes, nausea, vomiting, diarrhea, constipation, dizziness, abdominal pain, skin rash, fevers, chills, night sweats, weight loss, swollen lymph nodes, body aches, joint swelling,chest pain, shortness of breath, mood changes.  Positive muscle aches  Objective  Blood pressure 118/72, pulse 65, height 5\' 5"  (1.651 m), weight 144 lb (65.3 kg), SpO2 98 %.   General: No apparent distress alert and oriented x3 mood and affect normal, dressed appropriately.  HEENT: Pupils equal, extraocular movements intact  Respiratory: Patient's speak in full sentences and does not appear short of breath  Cardiovascular: No lower extremity edema, non tender, no erythema  Skin: Warm dry intact with no signs of infection or rash on extremities or on axial skeleton.  Abdomen: Soft nontender seems to be minorly distended. Neuro: Cranial nerves II through XII are intact, neurovascularly intact in all extremities with 2+ DTRs and 2+ pulses.  Lymph: No lymphadenopathy of posterior or anterior cervical chain or axillae bilaterally.  Gait normal with good balance and coordination.  MSK: Arthritic changes noted in multiple joints. Back exam does have some mild loss of lordosis.  Some mild atrophy of the lower extremities bilaterally but does have symmetric strength.  Deep tendon reflexes intact.     Impression and Recommendations:     The above documentation has been reviewed and is accurate and complete , DO       Note: This dictation was prepared with Dragon dictation along with smaller phrase technology. Any transcriptional errors that result from this process are unintentional.

## 2019-11-04 ENCOUNTER — Ambulatory Visit: Payer: Medicare Other | Admitting: Family Medicine

## 2019-11-06 DIAGNOSIS — Z20822 Contact with and (suspected) exposure to covid-19: Secondary | ICD-10-CM | POA: Diagnosis not present

## 2019-11-06 DIAGNOSIS — T8529XA Other mechanical complication of intraocular lens, initial encounter: Secondary | ICD-10-CM | POA: Diagnosis not present

## 2019-11-06 DIAGNOSIS — Z01812 Encounter for preprocedural laboratory examination: Secondary | ICD-10-CM | POA: Diagnosis not present

## 2019-11-08 DIAGNOSIS — Q132 Other congenital malformations of iris: Secondary | ICD-10-CM | POA: Diagnosis not present

## 2019-11-08 DIAGNOSIS — H27132 Posterior dislocation of lens, left eye: Secondary | ICD-10-CM | POA: Diagnosis not present

## 2019-11-10 ENCOUNTER — Other Ambulatory Visit: Payer: Self-pay

## 2019-11-11 ENCOUNTER — Ambulatory Visit (INDEPENDENT_AMBULATORY_CARE_PROVIDER_SITE_OTHER): Payer: Medicare Other | Admitting: Family Medicine

## 2019-11-11 ENCOUNTER — Encounter: Payer: Self-pay | Admitting: Family Medicine

## 2019-11-11 ENCOUNTER — Other Ambulatory Visit: Payer: Self-pay | Admitting: Cardiology

## 2019-11-11 VITALS — BP 121/81 | HR 76 | Temp 97.2°F | Resp 16 | Ht 65.0 in | Wt 144.5 lb

## 2019-11-11 DIAGNOSIS — G3184 Mild cognitive impairment, so stated: Secondary | ICD-10-CM | POA: Diagnosis not present

## 2019-11-11 DIAGNOSIS — E785 Hyperlipidemia, unspecified: Secondary | ICD-10-CM | POA: Diagnosis not present

## 2019-11-11 DIAGNOSIS — I10 Essential (primary) hypertension: Secondary | ICD-10-CM | POA: Diagnosis not present

## 2019-11-11 LAB — HEPATIC FUNCTION PANEL
ALT: 17 U/L (ref 0–53)
AST: 19 U/L (ref 0–37)
Albumin: 4.1 g/dL (ref 3.5–5.2)
Alkaline Phosphatase: 83 U/L (ref 39–117)
Bilirubin, Direct: 0.1 mg/dL (ref 0.0–0.3)
Total Bilirubin: 0.6 mg/dL (ref 0.2–1.2)
Total Protein: 6.6 g/dL (ref 6.0–8.3)

## 2019-11-11 LAB — CBC WITH DIFFERENTIAL/PLATELET
Basophils Absolute: 0.1 10*3/uL (ref 0.0–0.1)
Basophils Relative: 0.7 % (ref 0.0–3.0)
Eosinophils Absolute: 0.1 10*3/uL (ref 0.0–0.7)
Eosinophils Relative: 0.9 % (ref 0.0–5.0)
HCT: 41.3 % (ref 39.0–52.0)
Hemoglobin: 13.7 g/dL (ref 13.0–17.0)
Lymphocytes Relative: 20.5 % (ref 12.0–46.0)
Lymphs Abs: 1.8 10*3/uL (ref 0.7–4.0)
MCHC: 33.1 g/dL (ref 30.0–36.0)
MCV: 88.1 fl (ref 78.0–100.0)
Monocytes Absolute: 0.9 10*3/uL (ref 0.1–1.0)
Monocytes Relative: 10.5 % (ref 3.0–12.0)
Neutro Abs: 6 10*3/uL (ref 1.4–7.7)
Neutrophils Relative %: 67.4 % (ref 43.0–77.0)
Platelets: 189 10*3/uL (ref 150.0–400.0)
RBC: 4.68 Mil/uL (ref 4.22–5.81)
RDW: 13.6 % (ref 11.5–15.5)
WBC: 8.9 10*3/uL (ref 4.0–10.5)

## 2019-11-11 LAB — TSH: TSH: 2.85 u[IU]/mL (ref 0.35–4.50)

## 2019-11-11 NOTE — Progress Notes (Signed)
   Subjective:    Patient ID: [name], male    DOB: [birth year], 85 y.o.   MRN: [id number]  HPI HTN- chronic problem, on Amlodipine 5mg  daily, Coreg 6.25mg  BID, HCTZ 25mg  daily w/ good control.  Denies CP, SOB (continues to have chronic cough- following w/ Pulmonary), HAs, edema.  Hyperlipidemia- chronic problem, on Lipitor 80mg  daily.  Denies abd pain, N/V.  Most recent cholesterol panel was excellent.  LDL 34.  Cognitive impairment- on Aricept 10mg  daily.  Wife is concerned that memory has worsened due to recent surgical procedures and anesthesia.  Pt's long term memory is doing well.  Wife reports some issues w/ short term memory.  No issues w/ getting lost or wandering.  No visual or auditory hallucinations.   Review of Systems For ROS see HPI   This visit occurred during the SARS-CoV-2 public health emergency.  Safety protocols were in place, including screening questions prior to the visit, additional usage of staff PPE, and extensive cleaning of exam room while observing appropriate contact time as indicated for disinfecting solutions.       Objective:   Physical Exam Constitutional:      General: He is not in acute distress.    Appearance: Normal appearance. He is well-developed.  HENT:     Head: Normocephalic and atraumatic.  Eyes:     Conjunctiva/sclera: Conjunctivae normal.     Pupils: Pupils are equal, round, and reactive to light.  Neck:     Thyroid: No thyromegaly.  Cardiovascular:     Rate and Rhythm: Normal rate and regular rhythm.     Heart sounds: Normal heart sounds. No murmur.  Pulmonary:     Effort: Pulmonary effort is normal. No respiratory distress.     Breath sounds: Normal breath sounds.  Abdominal:     General: Bowel sounds are normal. There is no distension.     Palpations: Abdomen is soft.  Musculoskeletal:     Cervical back: Normal range of motion and neck supple.  Lymphadenopathy:     Cervical: No cervical adenopathy.  Skin:    General: Skin  is warm and dry.  Neurological:     Mental Status: He is alert and oriented to person, place, and time.     Cranial Nerves: No cranial nerve deficit.  Psychiatric:        Behavior: Behavior normal.           Assessment & Plan:

## 2019-11-11 NOTE — Assessment & Plan Note (Signed)
Ongoing issue.  On Aricept 10mg  daily.  Wife reports some short term memory and word finding issues but no wandering or getting lost.  No hallucinations.  No behavioral disturbances.  Will follow.

## 2019-11-11 NOTE — Assessment & Plan Note (Signed)
Chronic problem.  Tolerating statin w/o difficulty.  Recent lipid panel excellent.  No changes at this time.  Will follow.

## 2019-11-11 NOTE — Patient Instructions (Signed)
Schedule your complete physical in 6 months We'll notify you of your lab results and make any changes if needed Continue to work on healthy diet and regular exercise- you're doing great! Call with any questions or concerns Stay Safe!  Stay Healthy! 

## 2019-11-11 NOTE — Assessment & Plan Note (Signed)
Chronic problem.  Well controlled today.  Asymptomatic.  Check labs.  No anticipated med changes.  Will follow. 

## 2019-11-16 ENCOUNTER — Other Ambulatory Visit: Payer: Self-pay | Admitting: Internal Medicine

## 2019-11-17 ENCOUNTER — Telehealth: Payer: Self-pay | Admitting: Pulmonary Disease

## 2019-11-17 MED ORDER — MONTELUKAST SODIUM 10 MG PO TABS: 10.0000 mg | ORAL_TABLET | Freq: Every day | ORAL | 11 refills | Status: DC

## 2019-11-17 NOTE — Telephone Encounter (Signed)
Refill for Montelukast 10mg  sent to . Patient has been made aware.

## 2019-11-22 ENCOUNTER — Other Ambulatory Visit: Payer: Self-pay | Admitting: Cardiology

## 2019-12-16 IMAGING — RF DG SWALLOWING FUNCTION
1 series · 17 of 24 positions shown · non-contrast
Comparison: None.

CLINICAL DATA: Chronic productive cough for 4 months.

EXAM:
MODIFIED BARIUM SWALLOW
TECHNIQUE: Different consistencies of barium were administered orally to the
patient by the Speech Pathologist. Imaging of the pharynx was
performed in the lateral projection.
FLUOROSCOPY TIME:  Fluoroscopy Time:  1 minutes, 5 seconds
Radiation Exposure Index (if provided by the fluoroscopic device):
N/A
Number of Acquired Spot Images: 0

[Series 1: run · 9 acquisitions, 17 frames shown]
[im 1/9]
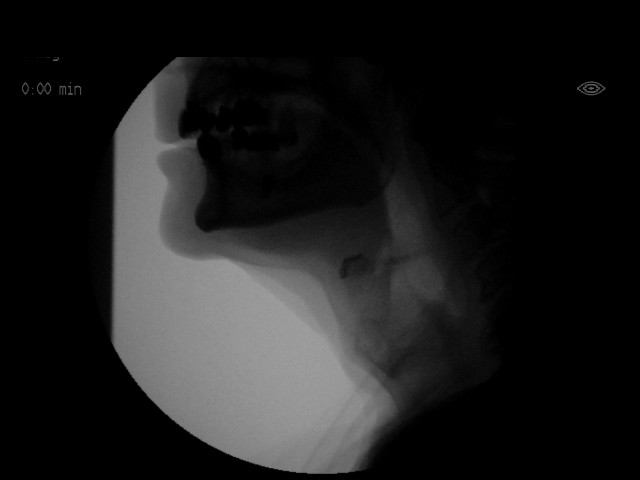
[im 1/9]
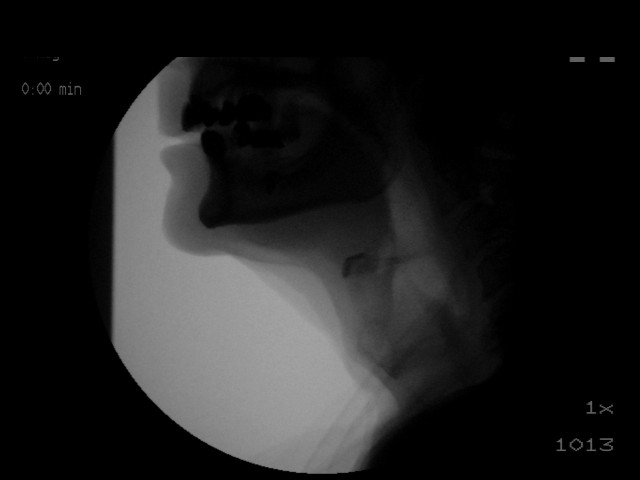
[im 2/9]
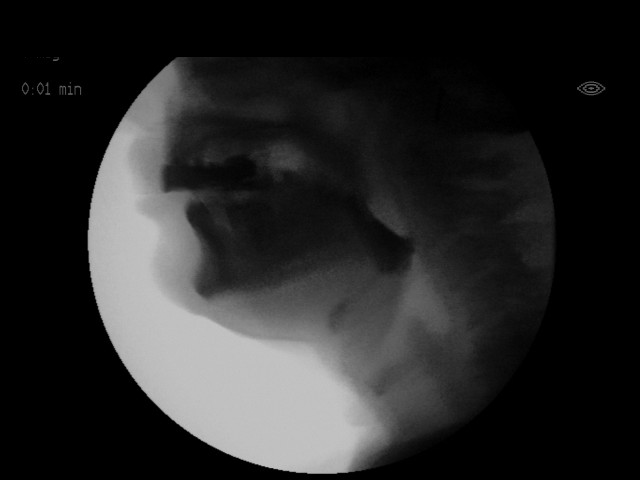
[im 2/9]
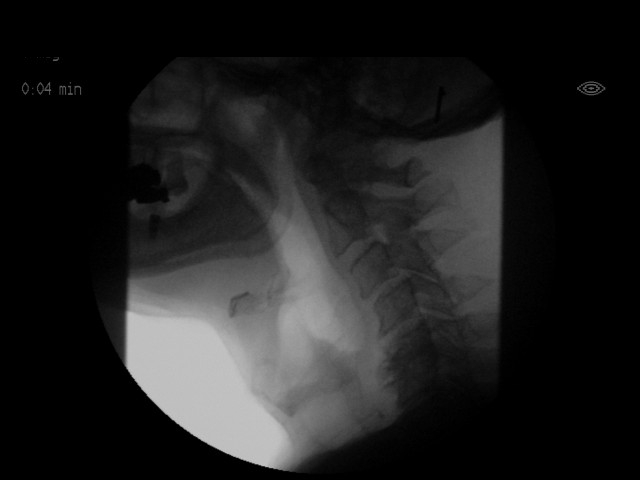
[im 3/9]
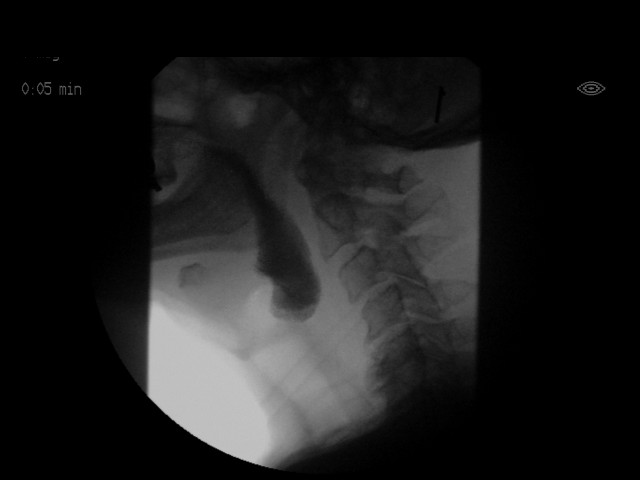
[im 3/9]
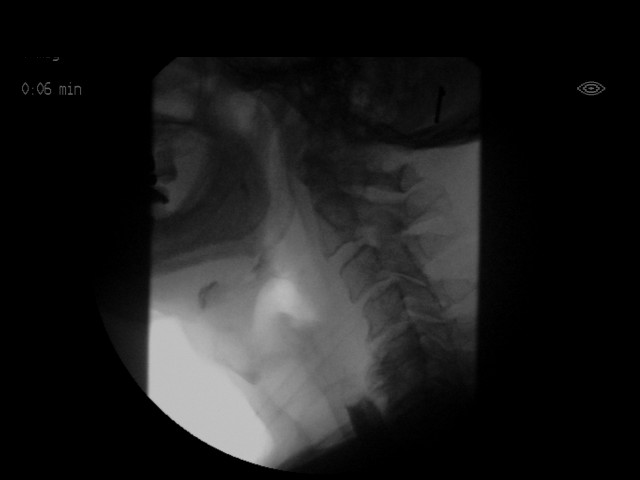
[im 4/9]
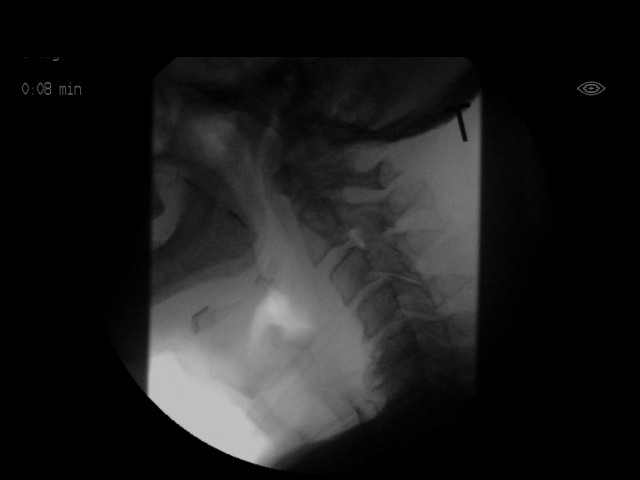
[im 5/9]
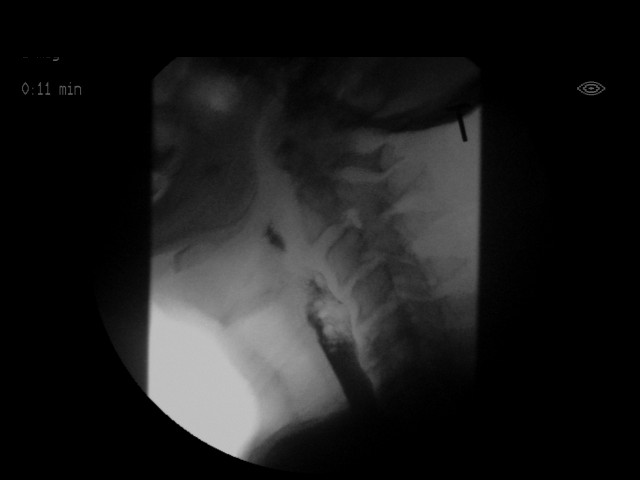
[im 5/9]
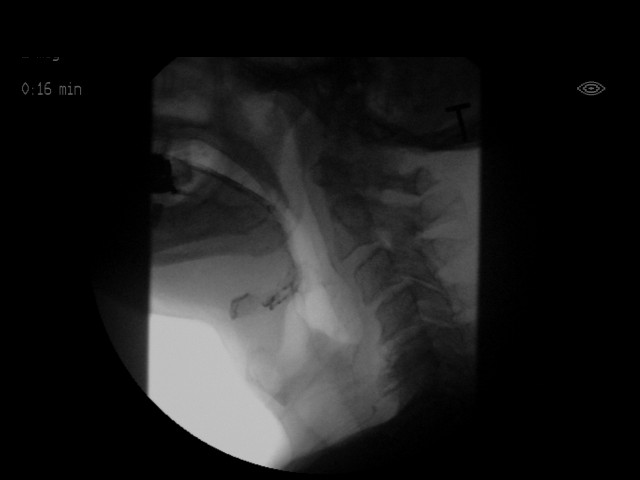
[im 6/9]
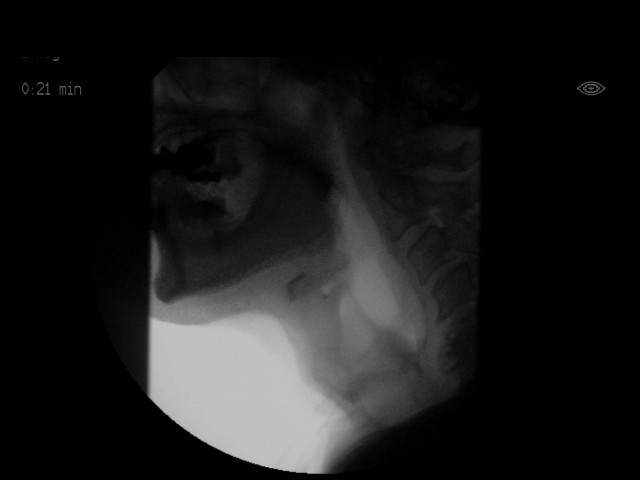
[im 6/9]
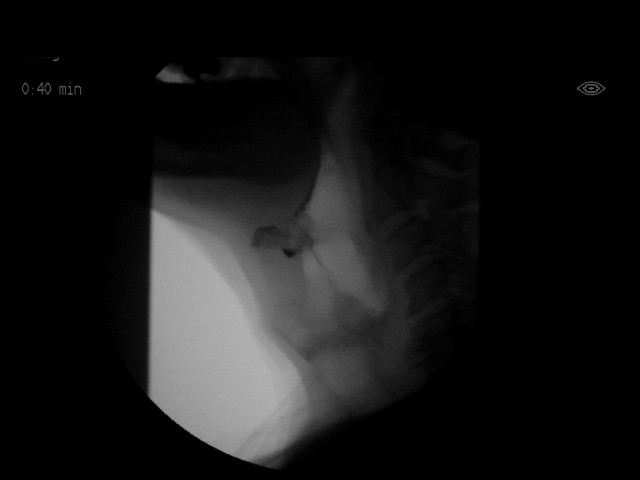
[im 7/9]
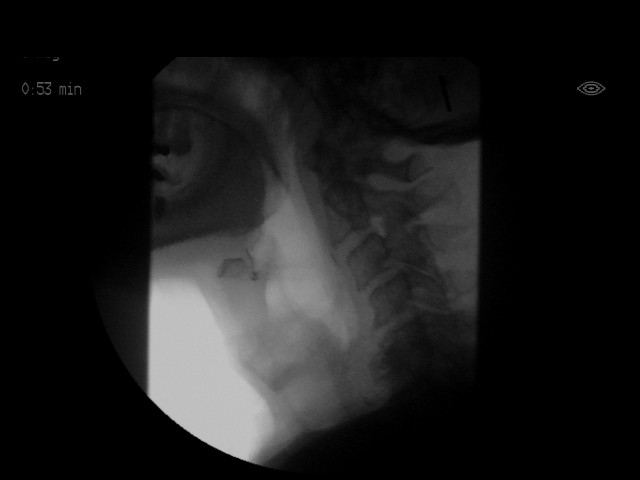
[im 7/9]
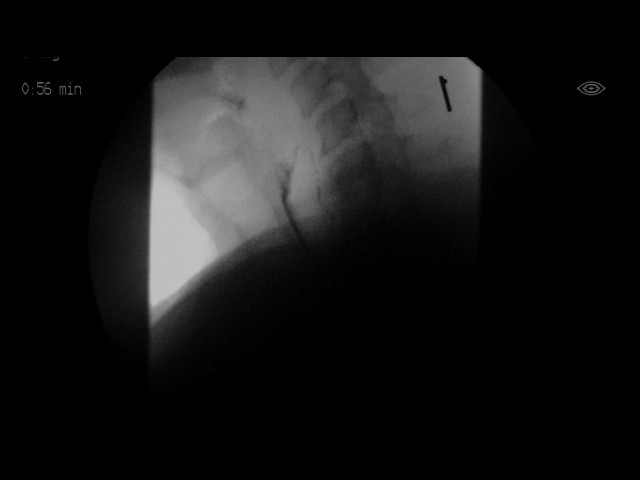
[im 8/9]
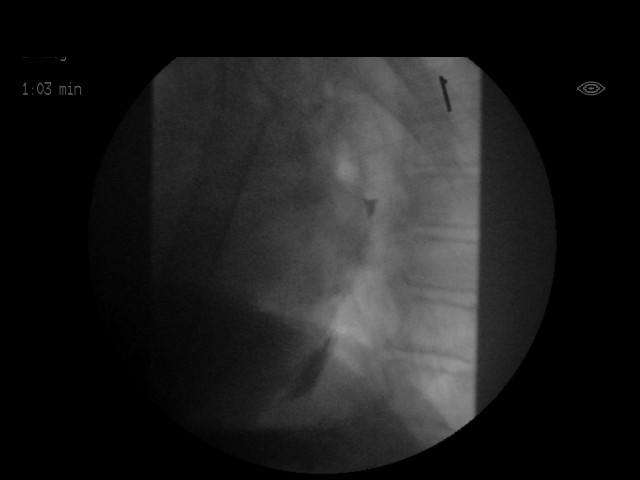
[im 8/9]
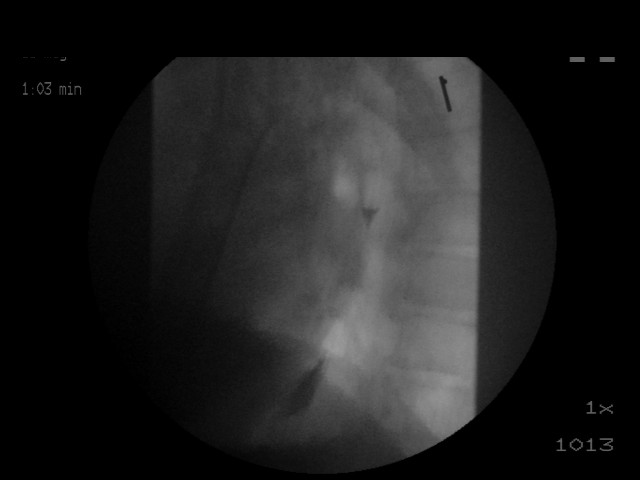
[im 9/9]
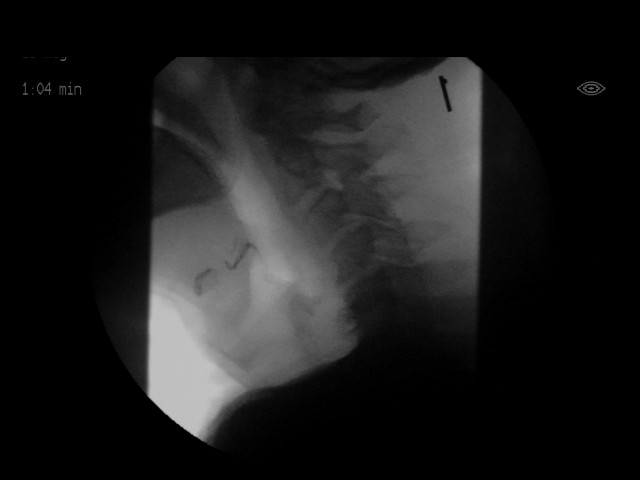
[im 9/9]
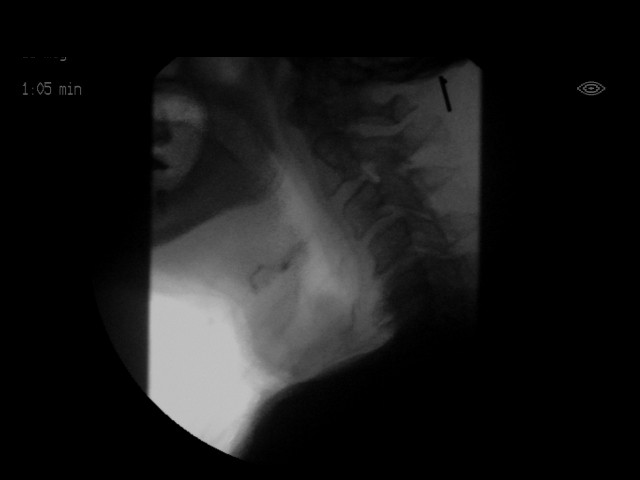

[17 of 24 positions shown; findings below may reference images not displayed]

FINDINGS: Thin liquid: Flash laryngeal penetration.

Puree consistency: Normal swallowing.

Puree on Erxleben cracker consistency: Normal swallowing.
IMPRESSION: 1. Near normal swallowing for age, with flash laryngeal penetration
with thin liquids not extending to the cords. Normal swallowing with
puree and semi solid consistency.

Please refer to the Speech Pathologists report for complete details
and recommendations.

## 2019-12-19 ENCOUNTER — Encounter (HOSPITAL_COMMUNITY): Payer: Medicare Other

## 2019-12-19 ENCOUNTER — Ambulatory Visit: Payer: Medicare Other

## 2019-12-26 ENCOUNTER — Other Ambulatory Visit: Payer: Self-pay | Admitting: General Practice

## 2019-12-26 MED ORDER — DONEPEZIL HCL 10 MG PO TABS: 10.0000 mg | ORAL_TABLET | Freq: Every day | ORAL | 0 refills | Status: DC

## 2020-01-15 DIAGNOSIS — Z20822 Contact with and (suspected) exposure to covid-19: Secondary | ICD-10-CM | POA: Diagnosis not present

## 2020-01-15 DIAGNOSIS — H25813 Combined forms of age-related cataract, bilateral: Secondary | ICD-10-CM | POA: Diagnosis not present

## 2020-01-17 DIAGNOSIS — H25811 Combined forms of age-related cataract, right eye: Secondary | ICD-10-CM | POA: Diagnosis not present

## 2020-01-20 ENCOUNTER — Other Ambulatory Visit: Payer: Self-pay | Admitting: *Deleted

## 2020-01-20 ENCOUNTER — Telehealth (HOSPITAL_COMMUNITY): Payer: Self-pay

## 2020-01-20 DIAGNOSIS — I6523 Occlusion and stenosis of bilateral carotid arteries: Secondary | ICD-10-CM

## 2020-01-20 DIAGNOSIS — I739 Peripheral vascular disease, unspecified: Secondary | ICD-10-CM

## 2020-01-20 NOTE — Telephone Encounter (Signed)
The above patient or their representative was contacted and gave the following answers to these questions:         Do you have any of the following symptoms?    NO  Fever                    Cough                   Shortness of breath  Do  you have any of the following other symptoms?    muscle pain         vomiting,        diarrhea        rash         weakness        red eye        abdominal pain         bruising          bruising or bleeding              joint pain           severe headache    Have you been in contact with someone who was or has been sick in the past 2 weeks?  NO  Yes                 Unsure                         Unable to assess   Does the person that you were in contact with have any of the following symptoms?   Cough         shortness of breath           muscle pain         vomiting,            diarrhea            rash            weakness           fever            red eye           abdominal pain           bruising  or  bleeding                joint pain                severe headache                 COMMENTS OR ACTION PLAN FOR THIS PATIENT:        ALL QUESTION WERE ANSWERED/[facility] PT IS FULLY VACCINATED/[facility]

## 2020-01-23 ENCOUNTER — Ambulatory Visit (INDEPENDENT_AMBULATORY_CARE_PROVIDER_SITE_OTHER)
Admission: RE | Admit: 2020-01-23 | Discharge: 2020-01-23 | Disposition: A | Discharge status: Home / Self Care | Payer: Medicare Other | Source: Ambulatory Visit | Attending: Surgery | Admitting: Surgery

## 2020-01-23 ENCOUNTER — Ambulatory Visit (HOSPITAL_COMMUNITY)
Admission: RE | Admit: 2020-01-23 | Discharge: 2020-01-23 | Disposition: A | Discharge status: Home / Self Care | Payer: Medicare Other | Source: Ambulatory Visit | Attending: Surgery | Admitting: Surgery

## 2020-01-23 ENCOUNTER — Ambulatory Visit (INDEPENDENT_AMBULATORY_CARE_PROVIDER_SITE_OTHER): Payer: Medicare Other | Admitting: Physician Assistant

## 2020-01-23 ENCOUNTER — Other Ambulatory Visit: Payer: Self-pay

## 2020-01-23 VITALS — BP 134/62 | HR 58 | Temp 97.7°F | Resp 20 | Ht 65.0 in | Wt 147.0 lb

## 2020-01-23 DIAGNOSIS — I6523 Occlusion and stenosis of bilateral carotid arteries: Secondary | ICD-10-CM

## 2020-01-23 DIAGNOSIS — I739 Peripheral vascular disease, unspecified: Secondary | ICD-10-CM | POA: Insufficient documentation

## 2020-01-23 NOTE — Progress Notes (Addendum)
HISTORY AND PHYSICAL     CC:  follow up. Requesting Provider:  Sheliah Hatch, MD  HPI: This is a 85 y.o. male who is here today for follow up.  He is being followed for bilateral ICA stenosis.  He has documented previous vein bypass graft/aorto-fem pop surgery in 2007 but pt does not recall this and this is why he got ABI's today.   He denies any hx of having any surgery on his legs.   The pt returns today for follow up.  He denies amaurosis fugax, weakness, numbness, paralysis, clumsiness or speech difficulties.  He states he did have right cataract surgery about a month ago, which went well.  He had cataract surgery on the left eye earlier last year with complications and had to be referred to the [facility] where he underwent further left eye surgery and is now doing well.    He recently saw his PCP and he does have a cognitive impairment and is on Aricept.  His wife was concerned that his memory has worsened and has some problems with short term memory.   He has hx of laparotomy in December 2019 by Dr. Dwain Sarna for pneumoperitoneum.  He has hx of HTN, HLD, and CAD with hx of PCI of RCA and is followed by Dr. Mayford Knife.    The pt is on a statin for cholesterol management.    The pt is on an aspirin.    Other AC:  none The pt is on BB, CCB for hypertension.  The pt does not have diabetes. Tobacco hx:  Remote-quit 1962   Past Medical History:  Diagnosis Date   Allergy    Arthritis    "scattered" (10/26/2018)   Asthma    C. difficile diarrhea    CAD (coronary artery disease) 2005   s/p PCI of RCA   Carotid artery stenosis    1-39% bilateral by dopplers 10/2016   Carotid stenosis    "? side; Dr. Edilia Bo"   Cataract    NS OD   Cognitive impairment, mild, so stated    Colitis    Erectile dysfunction    GERD (gastroesophageal reflux disease)    Hyperlipidemia    Hypertension    Perforated stomach, acute 10/23/2018   Peripheral vascular disease (HCC)     Retinal detachment    Vertebrobasilar artery syndrome    Vertigo     Past Surgical History:  Procedure Laterality Date   APPENDECTOMY  1954   CATARACT EXTRACTION Left 08/08/2019   Dr. Hazle Quant   COLONOSCOPY     ESOPHAGOGASTRODUODENOSCOPY (EGD) WITH ESOPHAGEAL DILATION  09/2018   EYE SURGERY     LAPAROTOMY N/A 10/23/2018   Procedure: EXPLORATORY LAPAROTOMY WITH GRAHAM PATCH FOR PERFORATED ULCER AND PLACEMENT OF GASTRIC TUBE;  Surgeon: Emelia Loron, MD;  Location: [facility] OR;  Service: General;  Laterality: N/A;   NASAL SINUS SURGERY  05/2018   PR VEIN BYPASS GRAFT,AORTO-FEM-POP  2007   RETINAL DETACHMENT SURGERY     "? side"   UPPER GASTROINTESTINAL ENDOSCOPY      Allergies  Allergen Reactions   Neosporin [Bacitracin-Polymyxin B] Other (See Comments)    Redness    Current Outpatient Medications  Medication Sig Dispense Refill   acetaminophen (TYLENOL) 500 MG tablet Take 500 mg by mouth 2 (two) times daily.     albuterol (PROVENTIL) (2.5 MG/3ML) 0.083% nebulizer solution Take 3 mLs (2.5 mg total) by nebulization every 6 (six) hours as needed for wheezing or shortness  of breath. 90 vial 3   albuterol (VENTOLIN HFA) 108 (90 Base) MCG/ACT inhaler Inhale 2 puffs into the lungs every 6 (six) hours as needed for wheezing or shortness of breath. 6.7 g 5   amLODipine (NORVASC) 5 MG tablet Take 1 tablet (5 mg total) by mouth daily. 90 tablet 3   aspirin EC 81 MG tablet Take 81 mg by mouth daily.     atorvastatin (LIPITOR) 80 MG tablet TAKE 1 TABLET BY MOUTH DAILY 90 tablet 0   azelastine (ASTELIN) 0.1 % nasal spray Place 1 spray into both nostrils 2 (two) times daily. 30 mL 5   BREO ELLIPTA 100-25 MCG/INH AEPB INHALE 1 PUFF INTO THE LUNGS DAILY 60 each 3   carvedilol (COREG) 6.25 MG tablet Take 1 tablet (6.25 mg total) by mouth 2 (two) times daily with a meal. 180 tablet 2   cetirizine (ZYRTEC) 10 MG tablet Take 10 mg by mouth daily.     cholecalciferol  (VITAMIN D3) 25 MCG (1000 UT) tablet Take 1,000 Units by mouth daily.     donepezil (ARICEPT) 10 MG tablet Take 1 tablet (10 mg total) by mouth daily. 90 tablet 0   Eyelid Cleansers (OCUSOFT EYELID CLEANSING) PADS Place 1 application into both eyes 2 (two) times daily.     fish oil-omega-3 fatty acids 1000 MG capsule Take 2 g by mouth 2 (two) times daily.      fluticasone (FLONASE) 50 MCG/ACT nasal spray 1 spray each nostril twice daily 16 g 5   gabapentin (NEURONTIN) 100 MG capsule Take 2 capsules (200 mg total) by mouth at bedtime. 180 capsule 1   gabapentin (NEURONTIN) 300 MG capsule Take 1 capsule (300 mg total) by mouth 3 (three) times daily. 90 capsule 3   hydrochlorothiazide (HYDRODIURIL) 25 MG tablet TAKE 1 TABLET BY MOUTH DAILY 90 tablet 3   LOTEMAX SM 0.38 % GEL      montelukast (SINGULAIR) 10 MG tablet Take 1 tablet (10 mg total) by mouth at bedtime. 30 tablet 11   ofloxacin (OCUFLOX) 0.3 % ophthalmic solution INSTILL 1 DROP IN LEFT EYE FOUR TIMES DAILY FOR 7 DAYS BEGINNING AFTER SURGERY     Polyvinyl Alcohol-Povidone (REFRESH OP) Place 1 drop into both eyes 2 (two) times daily.     prednisoLONE acetate (PRED FORTE) 1 % ophthalmic suspension Place 1 drop into the left eye every hour while awake. 15 mL 0   Respiratory Therapy Supplies (FLUTTER) DEVI 1 Device by Does not apply route daily. 1 each 0   Saw Palmetto 500 MG CAPS Take 1,000 mg by mouth 2 (two) times daily.      Spacer/Aero-Holding [facility] Use as directed. 1 each 0   No current facility-administered medications for this visit.    Family History  Problem Relation Age of Onset   Stroke Mother    Hyperlipidemia Mother    Hypertension Mother    Deep vein thrombosis Mother    Stroke Father    Hyperlipidemia Father    Hypertension Father    Deep vein thrombosis Father    Diverticulitis Father    Ulcerative colitis Father    Colon polyps Father    Colon cancer Neg Hx    Esophageal  cancer Neg Hx    Stomach cancer Neg Hx    Rectal cancer Neg Hx     Social History   Socioeconomic History   Marital status: Married    Spouse name: Not on file   Number of children:  Not on file   Years of education: Not on file   Highest education level: Not on file  Occupational History   Not on file  Tobacco Use   Smoking status: Former Smoker    Years: 2.00    Types: Cigars    Quit date: 05/13/1961    Years since quitting: 58.7   Smokeless tobacco: Never Used   Tobacco comment: "never inhaled" in the early 60's   Substance and Sexual Activity   Alcohol use: Yes    Alcohol/week: 14.0 standard drinks    Types: 14 Glasses of wine per week   Drug use: Never   Sexual activity: Not Currently  Other Topics Concern   Not on file  Social History Narrative   Married with 1 son and 2 daughters.  Retired.   2 caffeinated beverages daily, 3 glasses of wine daily   Never smoker no drug use no tobacco   Social Determinants of Corporate investment banker Strain:    Difficulty of Paying Living Expenses:   Food Insecurity:    Worried About Programme researcher, broadcasting/film/video in the Last Year:    Barista in the Last Year:   Transportation Needs:    Freight forwarder (Medical):    Lack of Transportation (Non-Medical):   Physical Activity:    Days of Exercise per Week:    Minutes of Exercise per Session:   Stress:    Feeling of Stress :   Social Connections:    Frequency of Communication with Friends and Family:    Frequency of Social Gatherings with Friends and Family:    Attends Religious Services:    Active Member of Clubs or Organizations:    Attends Engineer, structural:    Marital Status:   Intimate Partner Violence:    Fear of Current or Ex-Partner:    Emotionally Abused:    Physically Abused:    Sexually Abused:      REVIEW OF SYSTEMS:   [X]  denotes positive finding, [ ]  denotes negative finding Cardiac  Comments:  Chest  pain or chest pressure:    Shortness of breath upon exertion:    Short of breath when lying flat:    Irregular heart rhythm:        Vascular    Pain in calf, thigh, or hip brought on by ambulation:    Pain in feet at night that wakes you up from your sleep:     Blood clot in your veins:    Leg swelling:         Pulmonary    Oxygen at home:    Productive cough:     Wheezing:         Neurologic    Sudden weakness in arms or legs:     Sudden numbness in arms or legs:     Sudden onset of difficulty speaking or slurred speech:    Temporary loss of vision in one eye:     Problems with dizziness:         Gastrointestinal    Blood in stool:     Vomited blood:         Genitourinary    Burning when urinating:     Blood in urine:        Psychiatric    Major depression:         Hematologic    Bleeding problems:    Problems with blood clotting too easily:  Skin    Rashes or ulcers:        Constitutional    Fever or chills:      PHYSICAL EXAMINATION:  Today's Vitals   01/23/20 1321 01/23/20 1323  BP: 127/62 134/62  Pulse: (!) 58   Resp: 20   Temp: 97.7 F (36.5 C)   SpO2: 99%   Weight: 147 lb (66.7 kg)   Height: 5\' 5"  (1.651 m)    Body mass index is 24.46 kg/m.   General:  WDWN in NAD; vital signs documented above Gait: Not observed HENT: WNL, normocephalic Pulmonary: normal non-labored breathing , without Rales, rhonchi,  wheezing Cardiac: regular HR, without  Murmurs; without carotid bruits Abdomen: soft, NT, no masses; well healed laparotomy scar Skin: without rashes Vascular Exam/Pulses:  Right Left  Radial 2+ (normal) 2+ (normal)  Ulnar 1+ (weak) 2+ (normal)  Popliteal Unable to palpate  Unable to palpate   DP 2+ (normal) 2+ (normal)  PT 2+ (normal) 2+ (normal)   Extremities: without ischemic changes, without Gangrene , without cellulitis; without open wounds;  Musculoskeletal: no muscle wasting or atrophy  Neurologic: A&O X 3;  No focal  weakness or paresthesias are detected Psychiatric:  The pt has Normal affect.   Non-Invasive Vascular Imaging:   ABI's/TBI's on 01/23/2020: Right:  1.08 - great toe pressure:  114 Left:  1.16 - great toe pressure:  121   Non-Invasive Vascular Imaging:   Carotid Duplex on 01/23/2020: Right: 1-39% ICA stenosis Left:   40-59% ICA stenosis Vertebrals: Bilateral vertebral arteries demonstrate antegrade flow.  Subclavians: Normal flow hemodynamics were seen in bilateral subclavian arteries.  Previous ABI's/TBI none  Previous Carotid duplex on 11/11/2018: Right:  1-39% ICA stenosis Left:  40-59% ICA stenosis Vertebrals: Bilateral vertebral arteries demonstrate antegrade flow.  Subclavians: Normal flow hemodynamics were seen in bilateral subclavian arteries.   ASSESSMENT/PLAN:: 85 y.o. male here for follow up for carotid artery stenosis.    Carotid artery stenosis -pt doing well and remains asymptomatic.  His carotid duplex is stable with 1-39% ICA stenosis on the right and 40-59% ICA stenosis on the left.  -f/u in one year for repeat carotid duplex-he will call sooner should he have any issues.  -discussed with pt stroke sx and he develops these, he should call 911 to go to the ER.  -continue statin/asa  PAD -documented hx of vascular bypass in 2007 but no operative note & pt adamant about not ever having surgery on his legs.  He states he had a cardiac stent via the groin but nothing else.  There are no surgical scars on his legs or in his groins.  Will d/w our practice administrator on how to remove this from his records.   Addendum:  This was deleted from his surgical hx.  -ABI's today are normal with normal toe pressures with normal pedal pulses that are palpable.    2008, [facility] [phone]  Clinic MD:   On call MD - [phone]

## 2020-01-24 ENCOUNTER — Encounter: Payer: Self-pay | Admitting: Physician Assistant

## 2020-01-24 ENCOUNTER — Other Ambulatory Visit: Payer: Self-pay | Admitting: *Deleted

## 2020-01-24 DIAGNOSIS — I6523 Occlusion and stenosis of bilateral carotid arteries: Secondary | ICD-10-CM

## 2020-02-09 ENCOUNTER — Other Ambulatory Visit: Payer: Self-pay | Admitting: Cardiology

## 2020-02-14 ENCOUNTER — Telehealth: Payer: Self-pay | Admitting: Family Medicine

## 2020-02-14 ENCOUNTER — Other Ambulatory Visit: Payer: Self-pay | Admitting: Cardiology

## 2020-02-14 NOTE — Progress Notes (Signed)
  Chronic Care Management   Note  02/14/2020 Name: Mikie Misner MRN: [id number] DOB: [birth year]  Almir Botts is a 85 y.o. year old male who is a primary care patient of Beverely Low, Helane Rima, MD. I reached out to Viviana Simpler by phone today in response to a referral sent by Mr. Coltyn Hanning PCP, Sheliah Hatch, MD.   Mr. Gunner was given information about Chronic Care Management services today including:  1. CCM service includes personalized support from designated clinical staff supervised by his physician, including individualized plan of care and coordination with other care providers 2. 24/7 contact phone numbers for assistance for urgent and routine care needs. 3. Service will only be billed when office clinical staff spend 20 minutes or more in a month to coordinate care. 4. Only one practitioner may furnish and bill the service in a calendar month. 5. The patient may stop CCM services at any time (effective at the end of the month) by phone call to the office staff.   Patient did not agree to services and wishes to consider information provided before deciding about enrollment in care management services.   Follow up plan:   Lynnae January Upstream Scheduler

## 2020-02-21 ENCOUNTER — Telehealth: Payer: Self-pay | Admitting: Family Medicine

## 2020-02-21 NOTE — Telephone Encounter (Signed)
Left message for patient to schedule Annual Wellness Visit.  Please schedule with Nurse Health Advisor Victoria Britt, RN at [facility]  

## 2020-02-29 ENCOUNTER — Other Ambulatory Visit: Payer: Self-pay | Admitting: *Deleted

## 2020-02-29 MED ORDER — AZELASTINE HCL 0.1 % NA SOLN: 1.0000 | Freq: Two times a day (BID) | NASAL | 2 refills | Status: DC

## 2020-03-07 ENCOUNTER — Ambulatory Visit (INDEPENDENT_AMBULATORY_CARE_PROVIDER_SITE_OTHER): Payer: Medicare Other

## 2020-03-07 DIAGNOSIS — Z0000 Encounter for general adult medical examination without abnormal findings: Secondary | ICD-10-CM | POA: Diagnosis not present

## 2020-03-07 NOTE — Progress Notes (Signed)
This visit is being conducted via phone call due to the COVID-19 pandemic. This patient has given me verbal consent via phone to conduct this visit, patient states they are participating from their home address. Some vital signs may be absent or patient reported.   Patient identification: identified by name, DOB, and current address.  Location provider: [facility], Office Persons participating in the virtual visit: Kandis Fantasia LPN, patient, and Dr. Neena Rhymes    Subjective:   [name] is a 85 y.o. male who presents for Medicare Annual/Subsequent preventive examination.  Review of Systems:   Cardiac Risk Factors include: advanced age (>59men, >49 women);male gender;hypertension    Objective:    Vitals: There were no vitals taken for this visit.  There is no height or weight on file to calculate BMI.  Advanced Directives 03/07/2020 12/21/2018 10/26/2018 10/23/2018 10/04/2018 09/13/2014  Does Patient Have a Medical Advance Directive? Yes Yes Yes Yes Yes Yes  Type of Advance Directive Living will;Healthcare Power of [employer] Power of [place];Living will Living will Healthcare Power of [place];Living will Living will;Healthcare Power of Attorney Living will;Healthcare Power of Attorney  Does patient want to make changes to medical advance directive? No - Patient declined - No - Patient declined - - No - Patient declined  Copy of Healthcare Power of Attorney in Chart? No - copy requested - - - - No - copy requested    Tobacco Social History   Tobacco Use  Smoking Status Former Smoker  . Years: 2.00  . Types: Cigars  . Quit date: 05/13/1961  . Years since quitting: 58.8  Smokeless Tobacco Never Used  Tobacco Comment   "never inhaled" in the early 60's      Counseling given: Not Answered Comment: "never inhaled" in the early 60's    Clinical Intake:  Pre-visit preparation completed: Yes  Pain : No/denies pain  Diabetes: No  How often do you need to  have someone help you when you read instructions, pamphlets, or other written materials from your doctor or pharmacy?: 1 - Never  Interpreter Needed?: No  Information entered by :: Kandis Fantasia LPN  Past Medical History:  Diagnosis Date  . Allergy   . Arthritis    "scattered" (10/26/2018)  . Asthma   . C. difficile diarrhea   . CAD (coronary artery disease) 2005   s/p PCI of RCA  . Carotid artery stenosis    1-39% bilateral by dopplers 10/2016  . Carotid stenosis    "? side; Dr. Edilia Bo"  . Cataract    NS OD  . Cognitive impairment, mild, so stated   . Colitis   . Erectile dysfunction   . GERD (gastroesophageal reflux disease)   . Hyperlipidemia   . Hypertension   . Perforated stomach, acute 10/23/2018  . Peripheral vascular disease (HCC)   . Retinal detachment   . Vertebrobasilar artery syndrome   . Vertigo    Past Surgical History:  Procedure Laterality Date  . APPENDECTOMY  1954  . CATARACT EXTRACTION Left 08/08/2019   Dr. Hazle Quant  . COLONOSCOPY    . ESOPHAGOGASTRODUODENOSCOPY (EGD) WITH ESOPHAGEAL DILATION  09/2018  . EYE SURGERY    . LAPAROTOMY N/A 10/23/2018   Procedure: EXPLORATORY LAPAROTOMY WITH GRAHAM PATCH FOR PERFORATED ULCER AND PLACEMENT OF GASTRIC TUBE;  Surgeon: Emelia Loron, MD;  Location: [facility] OR;  Service: General;  Laterality: N/A;  . NASAL SINUS SURGERY  05/2018  . RETINAL DETACHMENT SURGERY     "? side"  .  UPPER GASTROINTESTINAL ENDOSCOPY     Family History  Problem Relation Age of Onset  . Stroke Mother   . Hyperlipidemia Mother   . Hypertension Mother   . Deep vein thrombosis Mother   . Stroke Father   . Hyperlipidemia Father   . Hypertension Father   . Deep vein thrombosis Father   . Diverticulitis Father   . Ulcerative colitis Father   . Colon polyps Father   . Colon cancer Neg Hx   . Esophageal cancer Neg Hx   . Stomach cancer Neg Hx   . Rectal cancer Neg Hx    Social History   Socioeconomic History  . Marital status:  Married    Spouse name: Not on file  . Number of children: 3  . Years of education: Not on file  . Highest education level: Not on file  Occupational History  . Occupation: Retired Teaching laboratory technician: RETIRED  Tobacco Use  . Smoking status: Former Smoker    Years: 2.00    Types: Cigars    Quit date: 05/13/1961    Years since quitting: 58.8  . Smokeless tobacco: Never Used  . Tobacco comment: "never inhaled" in the early 60's   Substance and Sexual Activity  . Alcohol use: Yes    Alcohol/week: 14.0 standard drinks    Types: 14 Glasses of wine per week  . Drug use: Never  . Sexual activity: Not Currently  Other Topics Concern  . Not on file  Social History Narrative   Married with 1 son and 2 daughters.  Retired.   2 caffeinated beverages daily, 3 glasses of wine daily   Never smoker no drug use no tobacco   Social Determinants of Health   Financial Resource Strain:   . Difficulty of Paying Living Expenses:   Food Insecurity:   . Worried About Programme researcher, broadcasting/film/video in the Last Year:   . Barista in the Last Year:   Transportation Needs:   . Freight forwarder (Medical):   Marland Kitchen Lack of Transportation (Non-Medical):   Physical Activity:   . Days of Exercise per Week:   . Minutes of Exercise per Session:   Stress:   . Feeling of Stress :   Social Connections:   . Frequency of Communication with Friends and Family:   . Frequency of Social Gatherings with Friends and Family:   . Attends Religious Services:   . Active Member of Clubs or Organizations:   . Attends Banker Meetings:   Marland Kitchen Marital Status:     Outpatient Encounter Medications as of 03/07/2020  Medication Sig  . acetaminophen (TYLENOL) 500 MG tablet Take 500 mg by mouth 2 (two) times daily.  Marland Kitchen albuterol (PROVENTIL) (2.5 MG/3ML) 0.083% nebulizer solution Take 3 mLs (2.5 mg total) by nebulization every 6 (six) hours as needed for wheezing or shortness of breath.  Marland Kitchen albuterol  (VENTOLIN HFA) 108 (90 Base) MCG/ACT inhaler Inhale 2 puffs into the lungs every 6 (six) hours as needed for wheezing or shortness of breath.  Marland Kitchen amLODipine (NORVASC) 5 MG tablet Take 1 tablet (5 mg total) by mouth daily.  Marland Kitchen aspirin EC 81 MG tablet Take 81 mg by mouth daily.  Marland Kitchen atorvastatin (LIPITOR) 80 MG tablet TAKE 1 TABLET BY MOUTH DAILY  . azelastine (ASTELIN) 0.1 % nasal spray Place 1 spray into both nostrils 2 (two) times daily.  Marland Kitchen BREO ELLIPTA 100-25 MCG/INH AEPB INHALE 1 PUFF INTO  THE LUNGS DAILY  . carvedilol (COREG) 6.25 MG tablet Take 1 tablet (6.25 mg total) by mouth 2 (two) times daily with a meal.  . cetirizine (ZYRTEC) 10 MG tablet Take 10 mg by mouth daily.  . cholecalciferol (VITAMIN D3) 25 MCG (1000 UT) tablet Take 1,000 Units by mouth daily.  Marland Kitchen donepezil (ARICEPT) 10 MG tablet Take 1 tablet (10 mg total) by mouth daily.  . Eyelid Cleansers (OCUSOFT EYELID CLEANSING) PADS Place 1 application into both eyes 2 (two) times daily.  . fish oil-omega-3 fatty acids 1000 MG capsule Take 2 g by mouth 2 (two) times daily.   . fluticasone (FLONASE) 50 MCG/ACT nasal spray 1 spray each nostril twice daily  . gabapentin (NEURONTIN) 100 MG capsule Take 2 capsules (200 mg total) by mouth at bedtime.  . gabapentin (NEURONTIN) 300 MG capsule Take 1 capsule (300 mg total) by mouth 3 (three) times daily.  . hydrochlorothiazide (HYDRODIURIL) 25 MG tablet TAKE 1 TABLET BY MOUTH DAILY  . montelukast (SINGULAIR) 10 MG tablet Take 1 tablet (10 mg total) by mouth at bedtime.  . Polyvinyl Alcohol-Povidone (REFRESH OP) Place 1 drop into both eyes 2 (two) times daily.  Marland Kitchen Respiratory Therapy Supplies (FLUTTER) DEVI 1 Device by Does not apply route daily.  . Saw Palmetto 500 MG CAPS Take 1,000 mg by mouth 2 (two) times daily.   Marland Kitchen Spacer/Aero-Holding [facility] Use as directed.  Marland Kitchen LOTEMAX SM 0.38 % GEL   . ofloxacin (OCUFLOX) 0.3 % ophthalmic solution INSTILL 1 DROP IN LEFT EYE FOUR TIMES DAILY FOR 7 DAYS  BEGINNING AFTER SURGERY  . prednisoLONE acetate (PRED FORTE) 1 % ophthalmic suspension Place 1 drop into the left eye every hour while awake. (Patient not taking: Reported on 03/07/2020)   No facility-administered encounter medications on file as of 03/07/2020.    Activities of Daily Living In your present state of health, do you have any difficulty performing the following activities: 03/07/2020 11/11/2019  Hearing? N N  Vision? N Y  Comment intermittent sees [facility]  Difficulty concentrating or making decisions? Y N  Walking or climbing stairs? N N  Dressing or bathing? N N  Doing errands, shopping? N N  Preparing Food and eating ? N -  Using the Toilet? N -  In the past six months, have you accidently leaked urine? N -  Do you have problems with loss of bowel control? N -  Managing your Medications? Y -  Comment assisted by spouse -  Managing your Finances? Y -  Comment assisted by spouse -  Housekeeping or managing your Housekeeping? N -  Some recent data might be hidden    Patient Care Team: Midge Minium, MD as PCP - General Sueanne Margarita, MD as PCP - Cardiology (Cardiology) Sueanne Margarita, MD as Consulting Physician (Cardiology) Angelia Mould, MD as Consulting Physician (Vascular Surgery) Festus Aloe, MD as Consulting Physician (Urology) Gatha Mayer, MD as Consulting Physician (Gastroenterology) Garner Nash, DO as Consulting Physician (Pulmonary Disease) Chrystine Oiler, MD as Consulting Physician (Ophthalmology)   Assessment:   This is a routine wellness examination for Zayveon.  Exercise Activities and Dietary recommendations Current Exercise Habits: The patient does not participate in regular exercise at present  Goals   None     Fall Risk Fall Risk  03/07/2020 11/11/2019 05/04/2019 10/11/2018 07/16/2018  Falls in the past year? 0 0 0 0 No  Number falls in past yr: 0 0 0 - -  Injury with Fall? 0 0 0 - -  Comment - - - - -    Follow up Falls evaluation completed;Education provided;Falls prevention discussed Falls evaluation completed - - -   Is the patient's home free of loose throw rugs in walkways, pet beds, electrical cords, etc?   yes      Grab bars in the bathroom? yes      Handrails on the stairs?   yes      Adequate lighting?   yes   Depression Screen PHQ 2/9 Scores 03/07/2020 11/11/2019 05/04/2019 10/11/2018  PHQ - 2 Score 0 0 0 0  PHQ- 9 Score - 0 0 0  Exception Documentation - - - -    Cognitive Function-patient currently on Aricept, reports that there has been no progression in cognitive problems    6CIT Screen 03/07/2020  What Year? 0 points  What month? 0 points  What time? 0 points  Count back from 20 0 points  Months in reverse 0 points  Repeat phrase 2 points  Total Score 2    Immunization History  Administered Date(s) Administered  . Fluad Quad(high Dose 65+) 06/24/2019  . Influenza Split 08/25/2011, 08/09/2012  . Influenza Whole 07/11/2010  . Influenza, High Dose Seasonal PF 08/14/2014, 07/16/2018  . Influenza,inj,Quad PF,6+ Mos 08/11/2013, 08/18/2014, 07/11/2015, 07/14/2016, 07/20/2017  . PFIZER SARS-COV-2 Vaccination 11/15/2019, 12/06/2019  . Pneumococcal Conjugate-13 01/25/2015  . Pneumococcal Polysaccharide-23 09/15/2002  . Td 12/13/2007, 05/31/2019  . Zoster 10/29/2010    Qualifies for Shingles Vaccine? Discussed and patient will check with pharmacy for coverage.  Patient education handout provided   Screening Tests Health Maintenance  Topic Date Due  . INFLUENZA VACCINE  05/27/2020  . TETANUS/TDAP  05/30/2029  . COVID-19 Vaccine  Completed  . PNA vac Low Risk Adult  Completed   Cancer Screenings: Lung: Low Dose CT Chest recommended if Age 46-80 years, 30 pack-year currently smoking OR have quit w/in 15years. Patient does not qualify. Colorectal: No longer indicated     Plan:  I have personally reviewed and addressed the Medicare Annual Wellness questionnaire and  have noted the following in the patient's chart:  A. Medical and social history B. Use of alcohol, tobacco or illicit drugs  C. Current medications and supplements D. Functional ability and status E.  Nutritional status F.  Physical activity G. Advance directives H. List of other physicians I.  Hospitalizations, surgeries, and ER visits in previous 12 months J.  Vitals K. Screenings such as hearing and vision if needed, cognitive and depression L. Referrals, records requested, and appointments- none   In addition, I have reviewed and discussed with patient certain preventive protocols, quality metrics, and best practice recommendations. A written personalized care plan for preventive services as well as general preventive health recommendations were provided to patient.   Signed,  Kandis Fantasia, LPN  Nurse Health Advisor   Nurse Notes: no additional

## 2020-03-07 NOTE — Patient Instructions (Signed)
[name] , Thank you for taking time to come for your Medicare Wellness Visit. I appreciate your ongoing commitment to your health goals. Please review the following plan we discussed and let me know if I can assist you in the future.   Screening recommendations/referrals: Colorectal Screening: No longer indicated   Vision and Dental Exams: Recommended annual ophthalmology exams for early detection of glaucoma and other disorders of the eye Recommended annual dental exams for proper oral hygiene  Vaccinations: Influenza vaccine: up to date; last 06/24/19 Pneumococcal vaccine: up to date; last 01/25/15 Tdap vaccine: up to date; last 05/31/19  Shingles vaccine:  You may receive this vaccine at your local pharmacy. (see handout)  Covid vaccine: completed   Advanced directives: Please bring a copy of your POA (Power of Attorney) and/or Living Will to your next appointment.  Goals: Recommend to drink at least 6-8 8oz glasses of water per day and consume a balanced diet rich in fresh fruits and vegetables.   Next appointment: Please schedule your Annual Wellness Visit with your Nurse Health Advisor in one year.  Preventive Care 10 Years and Older, Male Preventive care refers to lifestyle choices and visits with your health care provider that can promote health and wellness. What does preventive care include?  A yearly physical exam. This is also called an annual well check.  Dental exams once or twice a year.  Routine eye exams. Ask your health care provider how often you should have your eyes checked.  Personal lifestyle choices, including:  Daily care of your teeth and gums.  Regular physical activity.  Eating a healthy diet.  Avoiding tobacco and drug use.  Limiting alcohol use.  Practicing safe sex.  Taking low doses of aspirin every day if recommended by your health care provider..  Taking vitamin and mineral supplements as recommended by your health care provider. What  happens during an annual well check? The services and screenings done by your health care provider during your annual well check will depend on your age, overall health, lifestyle risk factors, and family history of disease. Counseling  Your health care provider may ask you questions about your:  Alcohol use.  Tobacco use.  Drug use.  Emotional well-being.  Home and relationship well-being.  Sexual activity.  Eating habits.  History of falls.  Memory and ability to understand (cognition).  Work and work Astronomer. Screening  You may have the following tests or measurements:  Height, weight, and BMI.  Blood pressure.  Lipid and cholesterol levels. These may be checked every 5 years, or more frequently if you are over 80 years old.  Skin check.  Lung cancer screening. You may have this screening every year starting at age 81 if you have a 30-pack-year history of smoking and currently smoke or have quit within the past 15 years.  Fecal occult blood test (FOBT) of the stool. You may have this test every year starting at age 85.  Flexible sigmoidoscopy or colonoscopy. You may have a sigmoidoscopy every 5 years or a colonoscopy every 10 years starting at age 74.  Prostate cancer screening. Recommendations will vary depending on your family history and other risks.  Hepatitis C blood test.  Hepatitis B blood test.  Sexually transmitted disease (STD) testing.  Diabetes screening. This is done by checking your blood sugar (glucose) after you have not eaten for a while (fasting). You may have this done every 1-3 years.  Abdominal aortic aneurysm (AAA) screening. You may need this  if you are a current or former smoker.  Osteoporosis. You may be screened starting at age 59 if you are at high risk. Talk with your health care provider about your test results, treatment options, and if necessary, the need for more tests. Vaccines  Your health care provider may recommend  certain vaccines, such as:  Influenza vaccine. This is recommended every year.  Tetanus, diphtheria, and acellular pertussis (Tdap, Td) vaccine. You may need a Td booster every 10 years.  Zoster vaccine. You may need this after age 82.  Pneumococcal 13-valent conjugate (PCV13) vaccine. One dose is recommended after age 84.  Pneumococcal polysaccharide (PPSV23) vaccine. One dose is recommended after age 56. Talk to your health care provider about which screenings and vaccines you need and how often you need them. This information is not intended to replace advice given to you by your health care provider. Make sure you discuss any questions you have with your health care provider. Document Released: 11/09/2015 Document Revised: 07/02/2016 Document Reviewed: 08/14/2015 Elsevier Interactive Patient Education  2017 [place] Prevention in the Home Falls can cause injuries. They can happen to people of all ages. There are many things you can do to make your home safe and to help prevent falls. What can I do on the outside of my home?  Regularly fix the edges of walkways and driveways and fix any cracks.  Remove anything that might make you trip as you walk through a door, such as a raised step or threshold.  Trim any bushes or trees on the path to your home.  Use bright outdoor lighting.  Clear any walking paths of anything that might make someone trip, such as rocks or tools.  Regularly check to see if handrails are loose or broken. Make sure that both sides of any steps have handrails.  Any raised decks and porches should have guardrails on the edges.  Have any leaves, snow, or ice cleared regularly.  Use sand or salt on walking paths during winter.  Clean up any spills in your garage right away. This includes oil or grease spills. What can I do in the bathroom?  Use night lights.  Install grab bars by the toilet and in the tub and shower. Do not use towel bars as  grab bars.  Use non-skid mats or decals in the tub or shower.  If you need to sit down in the shower, use a plastic, non-slip stool.  Keep the floor dry. Clean up any water that spills on the floor as soon as it happens.  Remove soap buildup in the tub or shower regularly.  Attach bath mats securely with double-sided non-slip rug tape.  Do not have throw rugs and other things on the floor that can make you trip. What can I do in the bedroom?  Use night lights.  Make sure that you have a light by your bed that is easy to reach.  Do not use any sheets or blankets that are too big for your bed. They should not hang down onto the floor.  Have a firm chair that has side arms. You can use this for support while you get dressed.  Do not have throw rugs and other things on the floor that can make you trip. What can I do in the kitchen?  Clean up any spills right away.  Avoid walking on wet floors.  Keep items that you use a lot in easy-to-reach places.  If you need  to reach something above you, use a strong step stool that has a grab bar.  Keep electrical cords out of the way.  Do not use floor polish or wax that makes floors slippery. If you must use wax, use non-skid floor wax.  Do not have throw rugs and other things on the floor that can make you trip. What can I do with my stairs?  Do not leave any items on the stairs.  Make sure that there are handrails on both sides of the stairs and use them. Fix handrails that are broken or loose. Make sure that handrails are as long as the stairways.  Check any carpeting to make sure that it is firmly attached to the stairs. Fix any carpet that is loose or worn.  Avoid having throw rugs at the top or bottom of the stairs. If you do have throw rugs, attach them to the floor with carpet tape.  Make sure that you have a light switch at the top of the stairs and the bottom of the stairs. If you do not have them, ask someone to add them  for you. What else can I do to help prevent falls?  Wear shoes that:  Do not have high heels.  Have rubber bottoms.  Are comfortable and fit you well.  Are closed at the toe. Do not wear sandals.  If you use a stepladder:  Make sure that it is fully opened. Do not climb a closed stepladder.  Make sure that both sides of the stepladder are locked into place.  Ask someone to hold it for you, if possible.  Clearly mark and make sure that you can see:  Any grab bars or handrails.  First and last steps.  Where the edge of each step is.  Use tools that help you move around (mobility aids) if they are needed. These include:  Canes.  Walkers.  Scooters.  Crutches.  Turn on the lights when you go into a dark area. Replace any light bulbs as soon as they burn out.  Set up your furniture so you have a clear path. Avoid moving your furniture around.  If any of your floors are uneven, fix them.  If there are any pets around you, be aware of where they are.  Review your medicines with your doctor. Some medicines can make you feel dizzy. This can increase your chance of falling. Ask your doctor what other things that you can do to help prevent falls. This information is not intended to replace advice given to you by your health care provider. Make sure you discuss any questions you have with your health care provider. Document Released: 08/09/2009 Document Revised: 03/20/2016 Document Reviewed: 11/17/2014 Elsevier Interactive Patient Education  2017 [employer].

## 2020-03-20 ENCOUNTER — Ambulatory Visit (INDEPENDENT_AMBULATORY_CARE_PROVIDER_SITE_OTHER): Payer: Medicare Other | Admitting: Family Medicine

## 2020-03-20 ENCOUNTER — Other Ambulatory Visit: Payer: Self-pay

## 2020-03-20 ENCOUNTER — Ambulatory Visit (INDEPENDENT_AMBULATORY_CARE_PROVIDER_SITE_OTHER): Payer: Medicare Other

## 2020-03-20 ENCOUNTER — Other Ambulatory Visit: Payer: Self-pay | Admitting: Internal Medicine

## 2020-03-20 ENCOUNTER — Encounter: Payer: Self-pay | Admitting: Family Medicine

## 2020-03-20 VITALS — BP 132/74 | HR 59 | Ht 65.0 in | Wt 147.0 lb

## 2020-03-20 DIAGNOSIS — M170 Bilateral primary osteoarthritis of knee: Secondary | ICD-10-CM | POA: Diagnosis not present

## 2020-03-20 DIAGNOSIS — M25561 Pain in right knee: Secondary | ICD-10-CM | POA: Diagnosis not present

## 2020-03-20 DIAGNOSIS — M76892 Other specified enthesopathies of left lower limb, excluding foot: Secondary | ICD-10-CM | POA: Diagnosis not present

## 2020-03-20 DIAGNOSIS — M25562 Pain in left knee: Secondary | ICD-10-CM

## 2020-03-20 NOTE — Patient Instructions (Signed)
Thigh compression sleeve Ice 20 min at end of day Exercises 3x a week See me in 4-6 weeks

## 2020-03-20 NOTE — Progress Notes (Signed)
[facility] [place] Phone: [phone] Subjective:   [name], am serving as a scribe for Dr. Hulan Saas. This visit occurred during the SARS-CoV-2 public health emergency.  Safety protocols were in place, including screening questions prior to the visit, additional usage of staff PPE, and extensive cleaning of exam room while observing appropriate contact time as indicated for disinfecting solutions.   I'm seeing this patient by the request  of:  Midge Minium, MD  CC: Left knee pain  [id number]   11/02/2019 Discussed which activities to do which wants to avoid.  Patient is to increase activity slowly over the course the next several days.  Discussed icing regimen and home exercises.  Patient is doing really well as long as patient does well follow-up as needed in 1 patient to avoid this as long as doing well secondary to the coronavirus outbreak.  Update 03/20/2020 Cannon Arreola is a 85 y.o. male coming in with complaint of left knee pain. Patient states he has pain with knee flexion. Pain in posterior aspect of knee for one month. Unsure of cause of pain. Use Tylenol for pain. Has not been able to be active due to pain.  Worse with going downstairs.  Patient does not remember any specific injury.  Patient denies any true swelling.      Past Medical History:  Diagnosis Date  . Allergy   . Arthritis    "scattered" (10/26/2018)  . Asthma   . C. difficile diarrhea   . CAD (coronary artery disease) 2005   s/p PCI of RCA  . Carotid artery stenosis    1-39% bilateral by dopplers 10/2016  . Carotid stenosis    "? side; Dr. Scot Dock"  . Cataract    NS OD  . Cognitive impairment, mild, so stated   . Colitis   . Erectile dysfunction   . GERD (gastroesophageal reflux disease)   . Hyperlipidemia   . Hypertension   . Perforated stomach, acute 10/23/2018  . Peripheral vascular disease ([place])   . Retinal  detachment   . Vertebrobasilar artery syndrome   . Vertigo    Past Surgical History:  Procedure Laterality Date  . APPENDECTOMY  1954  . CATARACT EXTRACTION Left 08/08/2019   Dr. Bing Plume  . COLONOSCOPY    . ESOPHAGOGASTRODUODENOSCOPY (EGD) WITH ESOPHAGEAL DILATION  09/2018  . EYE SURGERY    . LAPAROTOMY N/A 10/23/2018   Procedure: EXPLORATORY LAPAROTOMY WITH GRAHAM PATCH FOR PERFORATED ULCER AND PLACEMENT OF GASTRIC TUBE;  Surgeon: Rolm Bookbinder, MD;  Location: [facility];  Service: General;  Laterality: N/A;  . NASAL SINUS SURGERY  05/2018  . RETINAL DETACHMENT SURGERY     "? side"  . UPPER GASTROINTESTINAL ENDOSCOPY     Social History   Socioeconomic History  . Marital status: Married    Spouse name: Not on file  . Number of children: 3  . Years of education: Not on file  . Highest education level: Not on file  Occupational History  . Occupation: Retired Landscape architect: RETIRED  Tobacco Use  . Smoking status: Former Smoker    Years: 2.00    Types: Cigars    Quit date: 05/13/1961    Years since quitting: 58.8  . Smokeless tobacco: Never Used  . Tobacco comment: "never inhaled" in the early 60's   Substance and Sexual Activity  . Alcohol use: Yes    Alcohol/week: 14.0 standard  drinks    Types: 14 Glasses of wine per week  . Drug use: Never  . Sexual activity: Not Currently  Other Topics Concern  . Not on file  Social History Narrative   Married with 1 son and 2 daughters.  Retired.   2 caffeinated beverages daily, 3 glasses of wine daily   Never smoker no drug use no tobacco   Social Determinants of Health   Financial Resource Strain:   . Difficulty of Paying Living Expenses:   Food Insecurity:   . Worried About Programme researcher, broadcasting/film/video in the Last Year:   . Barista in the Last Year:   Transportation Needs:   . Freight forwarder (Medical):   Marland Kitchen Lack of Transportation (Non-Medical):   Physical Activity:   . Days of Exercise per Week:    . Minutes of Exercise per Session:   Stress:   . Feeling of Stress :   Social Connections:   . Frequency of Communication with Friends and Family:   . Frequency of Social Gatherings with Friends and Family:   . Attends Religious Services:   . Active Member of Clubs or Organizations:   . Attends Banker Meetings:   Marland Kitchen Marital Status:    Allergies  Allergen Reactions  . Neosporin [Bacitracin-Polymyxin B] Other (See Comments)    Redness   Family History  Problem Relation Age of Onset  . Stroke Mother   . Hyperlipidemia Mother   . Hypertension Mother   . Deep vein thrombosis Mother   . Stroke Father   . Hyperlipidemia Father   . Hypertension Father   . Deep vein thrombosis Father   . Diverticulitis Father   . Ulcerative colitis Father   . Colon polyps Father   . Colon cancer Neg Hx   . Esophageal cancer Neg Hx   . Stomach cancer Neg Hx   . Rectal cancer Neg Hx      Current Outpatient Medications (Cardiovascular):  .  amLODipine (NORVASC) 5 MG tablet, Take 1 tablet (5 mg total) by mouth daily. Marland Kitchen  atorvastatin (LIPITOR) 80 MG tablet, TAKE 1 TABLET BY MOUTH DAILY .  carvedilol (COREG) 6.25 MG tablet, Take 1 tablet (6.25 mg total) by mouth 2 (two) times daily with a meal. .  hydrochlorothiazide (HYDRODIURIL) 25 MG tablet, TAKE 1 TABLET BY MOUTH DAILY  Current Outpatient Medications (Respiratory):  .  albuterol (PROVENTIL) (2.5 MG/3ML) 0.083% nebulizer solution, Take 3 mLs (2.5 mg total) by nebulization every 6 (six) hours as needed for wheezing or shortness of breath. Marland Kitchen  albuterol (VENTOLIN HFA) 108 (90 Base) MCG/ACT inhaler, Inhale 2 puffs into the lungs every 6 (six) hours as needed for wheezing or shortness of breath. Marland Kitchen  azelastine (ASTELIN) 0.1 % nasal spray, Place 1 spray into both nostrils 2 (two) times daily. Marland Kitchen  BREO ELLIPTA 100-25 MCG/INH AEPB, INHALE 1 PUFF INTO THE LUNGS DAILY .  cetirizine (ZYRTEC) 10 MG tablet, Take 10 mg by mouth daily. .   fluticasone (FLONASE) 50 MCG/ACT nasal spray, 1 spray each nostril twice daily .  montelukast (SINGULAIR) 10 MG tablet, Take 1 tablet (10 mg total) by mouth at bedtime.  Current Outpatient Medications (Analgesics):  .  acetaminophen (TYLENOL) 500 MG tablet, Take 500 mg by mouth 2 (two) times daily. Marland Kitchen  aspirin EC 81 MG tablet, Take 81 mg by mouth daily.   Current Outpatient Medications (Other):  .  cholecalciferol (VITAMIN D3) 25 MCG (1000 UT) tablet, Take 1,000  Units by mouth daily. Marland Kitchen  donepezil (ARICEPT) 10 MG tablet, Take 1 tablet (10 mg total) by mouth daily. .  Eyelid Cleansers (OCUSOFT EYELID CLEANSING) PADS, Place 1 application into both eyes 2 (two) times daily. .  fish oil-omega-3 fatty acids 1000 MG capsule, Take 2 g by mouth 2 (two) times daily.  Marland Kitchen  gabapentin (NEURONTIN) 100 MG capsule, Take 2 capsules (200 mg total) by mouth at bedtime. .  gabapentin (NEURONTIN) 300 MG capsule, Take 1 capsule (300 mg total) by mouth 3 (three) times daily. Marland Kitchen  LOTEMAX SM 0.38 % GEL,  .  ofloxacin (OCUFLOX) 0.3 % ophthalmic solution, INSTILL 1 DROP IN LEFT EYE FOUR TIMES DAILY FOR 7 DAYS BEGINNING AFTER SURGERY .  Polyvinyl Alcohol-Povidone (REFRESH OP), Place 1 drop into both eyes 2 (two) times daily. .  prednisoLONE acetate (PRED FORTE) 1 % ophthalmic suspension, Place 1 drop into the left eye every hour while awake. Marland Kitchen  Respiratory Therapy Supplies (FLUTTER) DEVI, 1 Device by Does not apply route daily. .  Saw Palmetto 500 MG CAPS, Take 1,000 mg by mouth 2 (two) times daily.  Marland Kitchen  Spacer/Aero-Holding Rudean Curt, Use as directed.   Reviewed prior external information including notes and imaging from  primary care provider As well as notes that were available from care everywhere and other healthcare systems.  Past medical history, social, surgical and family history all reviewed in electronic medical record.  No pertanent information unless stated regarding to the chief complaint.   Review of  Systems:  No headache, visual changes, nausea, vomiting, diarrhea, constipation, dizziness, abdominal pain, skin rash, fevers, chills, night sweats, weight loss, swollen lymph nodes, body aches, joint swelling, chest pain, shortness of breath, mood changes. POSITIVE muscle aches  Objective  Blood pressure 132/74, pulse (!) 59, height 5\' 5"  (1.651 m), weight 147 lb (66.7 kg), SpO2 95 %.   General: No apparent distress alert and oriented x3 mood and affect normal, dressed appropriately.  HEENT: Pupils equal, extraocular movements intact  Respiratory: Patient's speak in full sentences and does not appear short of breath  Cardiovascular: No lower extremity edema, non tender, no erythema  Neuro: Cranial nerves II through XII are intact, neurovascularly intact in all extremities with 2+ DTRs and 2+ pulses.  Gait normal with good balance and coordination.  MSK: Left knee has a trace effusion but near full movement.  Patient has pain with resisted flexion of the knee of the hamstring.  Tender to palpation in the distal hamstring noted. Patient does not have any instability with valgus or varus force.  Minimal crepitus with range of motion.  Negative patella grind test.  [id number]; 15 additional minutes spent for Therapeutic exercises as stated in above notes.  This included exercises focusing on stretching, strengthening, with significant focus on eccentric aspects.   Long term goals include an improvement in range of motion, strength, endurance as well as avoiding reinjury. Patient's frequency would include in 1-2 times a day, 3-5 times a week for a duration of 6-12 weeks.   Hamstring curls: Start with 3 sets of 15 (no weight); Progress by 5 reps every 3 days until you reach 3 sets of 30; After 3 days at 3 sets of 30, add 2lb ankle weight at 3 sets of 10; Increase every 5 days by 5 reps. You may add 2lbs ankle weight once weekly. Hamstring swings- swing leg backwards and curl at the end of the swing. Follow  same schedule as above. Hamstring running lunges- running  lunge position means no more than 45 degrees of knee flexion and running motion. Follow same schedule as above. Proper technique shown and discussed handout in great detail with ATC.  All questions were discussed and answered.      Impression and Recommendations:     This case required medical decision making of moderate complexity. The above documentation has been reviewed and is accurate and complete Judi Saa, DO       Note: This dictation was prepared with Dragon dictation along with smaller phrase technology. Any transcriptional errors that result from this process are unintentional.

## 2020-03-20 NOTE — Assessment & Plan Note (Addendum)
Patient has horrible distal hamstring tendinitis.  No sign of any deep venous thrombosis.  No pain in the calf.  Questionable lumbar radiculopathy as well as in the differential.  Patient does have some mild knee arthritis and x-rays are ordered at the moment.  Discussed which activities to do which wants to avoid.  Increase activity slowly.  Follow-up with me again in 4 to 8 weeks due to the posterior pain only did discuss with patient the significant swelling or increasing tenderness patient needs to seek medical attention immediately.  Patient is not on a blood thinner.  Pain is only with activity at this moment.

## 2020-03-21 ENCOUNTER — Telehealth: Payer: Self-pay | Admitting: Pulmonary Disease

## 2020-03-21 MED ORDER — BREO ELLIPTA 100-25 MCG/INH IN AEPB: INHALATION_SPRAY | RESPIRATORY_TRACT | 3 refills | Status: DC

## 2020-03-21 NOTE — Telephone Encounter (Signed)
Spoke with patient. He was requesting a refill on his Virgel Bouquet to be sent to [facility] in [place]. Advised him that I will go ahead and call in the refill for him. He verbalized understanding. Nothing further needed at time of call.

## 2020-03-23 ENCOUNTER — Telehealth: Payer: Self-pay | Admitting: Family Medicine

## 2020-03-23 MED ORDER — FLUTICASONE PROPIONATE 50 MCG/ACT NA SUSP: NASAL | 5 refills | Status: DC

## 2020-03-23 NOTE — Telephone Encounter (Signed)
Medication filled to pharmacy as requested.   

## 2020-03-23 NOTE — Telephone Encounter (Signed)
Pt called in asking for a refill on the Fluticasone nasal spray. Pt uses the [facility] on [address]

## 2020-03-27 ENCOUNTER — Other Ambulatory Visit: Payer: Self-pay | Admitting: General Practice

## 2020-03-27 MED ORDER — DONEPEZIL HCL 10 MG PO TABS: 10.0000 mg | ORAL_TABLET | Freq: Every day | ORAL | 0 refills | Status: DC

## 2020-04-06 ENCOUNTER — Ambulatory Visit (INDEPENDENT_AMBULATORY_CARE_PROVIDER_SITE_OTHER): Payer: Medicare Other

## 2020-04-06 ENCOUNTER — Other Ambulatory Visit: Payer: Self-pay

## 2020-04-06 ENCOUNTER — Encounter: Payer: Self-pay | Admitting: Primary Care

## 2020-04-06 ENCOUNTER — Ambulatory Visit: Payer: Medicare Other | Admitting: Primary Care

## 2020-04-06 VITALS — BP 126/72 | HR 69 | Temp 97.6°F | Ht 65.0 in | Wt 146.0 lb

## 2020-04-06 DIAGNOSIS — J441 Chronic obstructive pulmonary disease with (acute) exacerbation: Secondary | ICD-10-CM | POA: Diagnosis not present

## 2020-04-06 DIAGNOSIS — J310 Chronic rhinitis: Secondary | ICD-10-CM

## 2020-04-06 DIAGNOSIS — J339 Nasal polyp, unspecified: Secondary | ICD-10-CM | POA: Diagnosis not present

## 2020-04-06 DIAGNOSIS — R05 Cough: Secondary | ICD-10-CM | POA: Diagnosis not present

## 2020-04-06 DIAGNOSIS — J984 Other disorders of lung: Secondary | ICD-10-CM

## 2020-04-06 NOTE — Progress Notes (Signed)
Please let patient know CXR showed no acute cardiopulmonary process. Chronic bronchitic markings.

## 2020-04-06 NOTE — Progress Notes (Signed)
@Patient  ID: , male    DOB: 1934/12/01, 85 y.o.   MRN: [id number]  Chief Complaint  Patient presents with  . Follow-up    pt has chronic cough coughing up yellow mucus    Referring provider: [id number], MD  HPI: 85 year old male, former cigar smoker (cigars in [place] in 1960 <2 years). PMH significant for chronic cough, CAD, HTN. Recent hospitalization in December 2019 for perforated ulcer. [place] [facility] patient, consulted in-patient by Dr. January 2020 for chronic cough and wheeze. Possible underlying asthma. IgE and Allergy panel negative.  HRCT in March 2020 showed extensive clustered centrilobular and tree-in-bud nodular opacity of the right lung with consolidation RLL. Repeat CT chest in August showed interval clearing of pneumonia, no acute process.   Previous [place] Encounters: 1/20/2020[facility] follow-up Patient presents today for hospital follow-up. He is feeling well, reports noticeable improvement in cough and wheeze with nebulizer treatments twice daily. No shortness of breath. Coughing was his biggest complaint. Symptoms have slowly returned but states not nearly as bad as they were. FENO 49 today. Started on Asmanex 2 puffs twice daily and Singulair at bedtime. HRCT planned for March.   12/06/2018- Follow-up  Patient presents today for 2-3 week follow up. Accompanied by wife. Complains of nasal congestion/post nasal drip with yellow mucus. Associated cough and occasional wheezing. Symptoms are worse at night. Continues using Asmanex twice daily, flonase nasal spray and singulair. Needs prn albuterol during the afternoon, reports benefit but only last a few hours. Wife states cough was so much better but returned this week. Denies shortness of breath. HRCT planned for March.    12/30/2018- Acute visit, pneumonia Presents today for an acute visit to review recent HRCT results. Accompanied by his wife. Patient had a high resolution Chest CT scan 2 days  ago that showed extensive clustered centrilobular and tree-in-bud nodular opacity of the right lung, with consolidation of the dependent right lung base. No evidence of fibrotic interstitial lung disease. Complains of congested/productive cough. He is bringing up yellow colored mucus from his chest. Wife reports that he wakes up at night needing a breathing treatment because he can't stop coughing. Nebulizer does helps. Associated nasal congestion, patient states that he can't breath through his nose when lying down but sitting up he's fine. Denies aspiration  01/12/2019 Patient presents today for a two week follow-up visit. Accompanied by his wife. Feeling great, back to baseline. Recent HRCT showed extensive clustered centrilobular and tree-in-bud nodular opacity of the right lung with consolidation of the dependent right lung case. Findings consistent with multifocal infection, specifically aspiration given distrubution. Completed Levaquin course. Sputum culture has shown normal growth, AFB negative. Reports that his cough has been much better but still has some. He has been taking mucinex as directed. Continues Breo inhaler. FENO was previously elevated but trending down. PFTs today appear normal.   05/05/19 Follow-up, Dr. 07/06/19  Chronic cough and nasal congestion. Swallow study back in 2019 showed unspecified dysphagia. If symptoms ongoing and recurrent aspiration recommended repeat eval speech-language pathology. May need F EES or repeat M BSS. He has had polyps removed by ENT in the past. Encouraged follow-up with ENT.  06/07/2019 Patient presents today for follow-up visit. He is doing well, continues to have chronic cough and nasal congestion. Cough is worse at night. Uses wedge pillow. Stopping taking pantoprazole because it did not seem to help his cough. Reports no GERD symptoms. CT chest on 8/10 showed clearing of pneumonia.  No acute process. He is being followed by ENT at [facility] for recurrent nasal  polyps. Underwent nasal endoscopy. Both sinus purulent material at the infundibulum. Right sided sinus culture taken and positive for staph aureus. Starting Bactrim and tobramycin with sinus irrigation. Will follow-up with them in 3-4 weeks.    04/06/2020 Patient presents today for 1 year follow-up. Reports chronic cough with production. He is getting up yellow mucus. He has a hard time breathing at night d/t nasal congestion. He is compliant with flonase and Astelin at bedtime. He has had previous nasal polyp surgery. He follows with Dr. Janace Hoard with [facility]. He was last seen in November 2020. Uses Breo Ellipta 100 one puff daily in the morning as prescribed. He has not needed to use his Albuterol rescue inhaler. He has had both covid vaccine.     Significant testing: >> FENO 12/30/18 - 23 (37) (49) >> 12/30/2018 Ambulatory O2 low 94% RA  Pulmonary function testing: >>PFTs 01/12/2019 - FVC 2.75 (86%), FEV1 2.29 (103%), ratio 83. No BD response. DLCOunc 75% >>Spirometry 11/15/2018 -FVC 2.3 (68%), FEV1 2.0 (87%), ratio 90 Labs: >>IgE 15; Eos absolute 0.1; Resp allergy panel - neg Imaging: >> 06/06/2019 CT chest wo contrast- interval clearing of pneumonia, no acute process >> 02/09/2019 CT chest wo contrast-  >> 12/27/18 HRCT- extensive clustered centrilobular and tree-in-bud nodular opacity of the right lung, with consolidation of the dependent right lung base. No evidence of fibrotic interstitial lung disease. Findings are consistent with multifocal infection, and specifically aspiration given distribution. >> 10/25/19 CXR- Areas of subsegmental atelectasis or scarring in the lung bases bilaterally >> 10/23/18 CXR- Minimal bibasilar subsegmental atelectasis. >>10/23/18 CT abd/pelvis - 11 mm mean diameter nodule deep in the RIGHT LOWER LOBE adjacent to the RIGHT hemidiaphragm. Minimal dependent atelectasis posteriorly in the lower lobes.   Allergies  Allergen Reactions  . Neosporin  [Bacitracin-Polymyxin B] Other (See Comments)    Redness    Immunization History  Administered Date(s) Administered  . Fluad Quad(high Dose 65+) 06/24/2019  . Influenza Split 08/25/2011, 08/09/2012  . Influenza Whole 07/11/2010  . Influenza, High Dose Seasonal PF 08/14/2014, 07/16/2018  . Influenza,inj,Quad PF,6+ Mos 08/11/2013, 08/18/2014, 07/11/2015, 07/14/2016, 07/20/2017  . PFIZER SARS-COV-2 Vaccination 11/15/2019, 12/06/2019  . Pneumococcal Conjugate-13 01/25/2015  . Pneumococcal Polysaccharide-23 09/15/2002  . Td 12/13/2007, 05/31/2019  . Zoster 10/29/2010    Past Medical History:  Diagnosis Date  . Allergy   . Arthritis    "scattered" (10/26/2018)  . Asthma   . C. difficile diarrhea   . CAD (coronary artery disease) 2005   s/p PCI of RCA  . Carotid artery stenosis    1-39% bilateral by dopplers 10/2016  . Carotid stenosis    "? side; Dr. Scot Dock"  . Cataract    NS OD  . Cognitive impairment, mild, so stated   . Colitis   . Erectile dysfunction   . GERD (gastroesophageal reflux disease)   . Hyperlipidemia   . Hypertension   . Perforated stomach, acute 10/23/2018  . Peripheral vascular disease ([place])   . Retinal detachment   . Vertebrobasilar artery syndrome   . Vertigo     Tobacco History: Social History   Tobacco Use  Smoking Status Former Smoker  . Years: 2.00  . Types: Cigars  . Quit date: 05/13/1961  . Years since quitting: 58.9  Smokeless Tobacco Never Used  Tobacco Comment   "never inhaled" in the early 60's    Counseling given: Not Answered Comment: "never  inhaled" in the early 60's    Outpatient Medications Prior to Visit  Medication Sig Dispense Refill  . acetaminophen (TYLENOL) 500 MG tablet Take 500 mg by mouth 2 (two) times daily.    Marland Kitchen albuterol (PROVENTIL) (2.5 MG/3ML) 0.083% nebulizer solution Take 3 mLs (2.5 mg total) by nebulization every 6 (six) hours as needed for wheezing or shortness of breath. 90 vial 3  . albuterol (VENTOLIN  HFA) 108 (90 Base) MCG/ACT inhaler Inhale 2 puffs into the lungs every 6 (six) hours as needed for wheezing or shortness of breath. 6.7 g 5  . amLODipine (NORVASC) 5 MG tablet Take 1 tablet (5 mg total) by mouth daily. 90 tablet 3  . aspirin EC 81 MG tablet Take 81 mg by mouth daily.    Marland Kitchen atorvastatin (LIPITOR) 80 MG tablet TAKE 1 TABLET BY MOUTH DAILY 90 tablet 0  . azelastine (ASTELIN) 0.1 % nasal spray Place 1 spray into both nostrils 2 (two) times daily. 30 mL 2  . carvedilol (COREG) 6.25 MG tablet Take 1 tablet (6.25 mg total) by mouth 2 (two) times daily with a meal. 180 tablet 2  . cetirizine (ZYRTEC) 10 MG tablet Take 10 mg by mouth daily.    . cholecalciferol (VITAMIN D3) 25 MCG (1000 UT) tablet Take 1,000 Units by mouth daily.    Marland Kitchen donepezil (ARICEPT) 10 MG tablet Take 1 tablet (10 mg total) by mouth daily. 90 tablet 0  . Eyelid Cleansers (OCUSOFT EYELID CLEANSING) PADS Place 1 application into both eyes 2 (two) times daily.    . fish oil-omega-3 fatty acids 1000 MG capsule Take 2 g by mouth 2 (two) times daily.     . fluticasone (FLONASE) 50 MCG/ACT nasal spray 1 spray each nostril twice daily 16 g 5  . fluticasone furoate-vilanterol (BREO ELLIPTA) 100-25 MCG/INH AEPB INHALE 1 PUFF INTO THE LUNGS DAILY 60 each 3  . gabapentin (NEURONTIN) 100 MG capsule Take 2 capsules (200 mg total) by mouth at bedtime. 180 capsule 1  . gabapentin (NEURONTIN) 300 MG capsule Take 1 capsule (300 mg total) by mouth 3 (three) times daily. 90 capsule 3  . hydrochlorothiazide (HYDRODIURIL) 25 MG tablet TAKE 1 TABLET BY MOUTH DAILY 90 tablet 3  . LOTEMAX SM 0.38 % GEL     . montelukast (SINGULAIR) 10 MG tablet Take 1 tablet (10 mg total) by mouth at bedtime. 30 tablet 11  . ofloxacin (OCUFLOX) 0.3 % ophthalmic solution INSTILL 1 DROP IN LEFT EYE FOUR TIMES DAILY FOR 7 DAYS BEGINNING AFTER SURGERY    . Polyvinyl Alcohol-Povidone (REFRESH OP) Place 1 drop into both eyes 2 (two) times daily.    . prednisoLONE  acetate (PRED FORTE) 1 % ophthalmic suspension Place 1 drop into the left eye every hour while awake. 15 mL 0  . Respiratory Therapy Supplies (FLUTTER) DEVI 1 Device by Does not apply route daily. 1 each 0  . Saw Palmetto 500 MG CAPS Take 1,000 mg by mouth 2 (two) times daily.     Marland Kitchen Spacer/Aero-Holding [facility] Use as directed. 1 each 0   No facility-administered medications prior to visit.   Review of Systems  Review of Systems  HENT: Positive for congestion and postnasal drip.   Respiratory: Positive for cough.    Physical Exam  BP 126/72 (BP Location: Left Arm, Cuff Size: Normal)   Pulse 69   Temp 97.6 F (36.4 C) (Oral)   Ht 5\' 5"  (1.651 m)   Wt 146 lb (66.2  kg)   SpO2 96%   BMI 24.30 kg/m  Physical Exam Constitutional:      Appearance: Normal appearance.  HENT:     Head: Normocephalic and atraumatic.     Nose:     Comments: Right nasal polyp     Mouth/Throat:     Mouth: Mucous membranes are moist.     Pharynx: Oropharynx is clear.  Cardiovascular:     Rate and Rhythm: Normal rate and regular rhythm.  Pulmonary:     Effort: Pulmonary effort is normal.     Breath sounds: Normal breath sounds.     Comments: LSC Skin:    General: Skin is warm and dry.  Neurological:     General: No focal deficit present.     Mental Status: He is alert and oriented to person, place, and time. Mental status is at baseline.  Psychiatric:        Mood and Affect: Mood normal.        Behavior: Behavior normal.        Thought Content: Thought content normal.        Judgment: Judgment normal.      Lab Results:  CBC    Component Value Date/Time   WBC 8.9 11/11/2019 1116   RBC 4.68 11/11/2019 1116   HGB 13.7 11/11/2019 1116   HCT 41.3 11/11/2019 1116   PLT 189.0 11/11/2019 1116   MCV 88.1 11/11/2019 1116   MCH 27.5 10/26/2018 0606   MCHC 33.1 11/11/2019 1116   RDW 13.6 11/11/2019 1116   LYMPHSABS 1.8 11/11/2019 1116   MONOABS 0.9 11/11/2019 1116   EOSABS 0.1 11/11/2019  1116   BASOSABS 0.1 11/11/2019 1116    BMET    Component Value Date/Time   NA 138 09/09/2019 0831   K 3.8 09/09/2019 0831   CL 96 09/09/2019 0831   CO2 25 09/09/2019 0831   GLUCOSE 98 09/09/2019 0831   GLUCOSE 94 05/04/2019 0933   GLUCOSE 98 10/04/2010 0000   BUN 8 09/09/2019 0831   CREATININE 0.98 09/09/2019 0831   CREATININE 0.92 03/31/2016 1051   CALCIUM 9.9 09/09/2019 0831   GFRNONAA 72 09/09/2019 0831   GFRAA 83 09/09/2019 0831    BNP No results found for: BNP  ProBNP No results found for: PROBNP  Imaging: DG Chest 2 View  Result Date: 04/06/2020 CLINICAL DATA:  Relative cough EXAM: CHEST - 2 VIEW COMPARISON:  10/24/2018 FINDINGS: Normal mediastinum and cardiac silhouette. Chronic central bronchitic markings. Normal pulmonary vasculature. No effusion, infiltrate, or pneumothorax. IMPRESSION: No acute cardiopulmonary process. Electronically Signed   By: Genevive Bi M.D.   On: 04/06/2020 15:05   DG Knee Bilateral Standing AP  Result Date: 03/21/2020 CLINICAL DATA:  Pain bilaterally EXAM: BILATERAL KNEES STANDING - 1 VIEW COMPARISON:  None. FINDINGS: Right knee: Frontal view obtained. There is moderately severe narrowing medially. There is spurring medially and laterally. No fracture or dislocation evident. There is slight lateral patellar subluxation. There is slight chondrocalcinosis. Left knee: Frontal view obtained. Moderately severe narrowing medially. Spurring medially and laterally noted. No fracture or dislocation. There is slight lateral patellar subluxation. IMPRESSION: Right knee: Osteoarthritic change, primarily medially on frontal view. Mild lateral patellar subluxation. No fracture or dislocation. Mild chondrocalcinosis, a finding that may be seen with osteoarthritis or calcium pyrophosphate deposition disease. Left knee: Osteoarthritic change, primarily medially on frontal view. Lateral patellar subluxation. No fracture or dislocation. Electronically Signed    By: Bretta Bang III M.D.   On: 03/21/2020  08:21     Assessment & Plan:   Restrictive lung disease - Stable, rare SABA use  - Continue Breo Ellipta 100 one puff in the morning (rinse mouth after use)    Nasal polyps - Refer back to ENT, advised patient to contacted [facility] to schedule visit   Rhinitis, chronic - Recommend patient use saline nasal spray twice daily  - Continue Astelin nasal spray and Flonase as prescribed  - Continue Zyrtec 10mg  once daily and Singulair 1 tablet at bedtime      Glenford BayleyElizabeth W Ionia Schey, NP 04/09/2020

## 2020-04-06 NOTE — Patient Instructions (Addendum)
  Asthma: - Continue Breo one puff in the morning (rinse mouth after use)  Allergic rhinitis: - Continue Zyrtec 10mg  once daily - Continue Singulair 1 tablet at bedtime  - Use saline nasal spray first thing in the morning and then again at night followed by Astelin nasal spray twice day. Then use flonase nasal spray at night.   Orders: - CXR re: cough  - Sputum sample   Follow-up: - Recommend you follow up with Dr. with [facility] regarding chronic sinusitis and right nasal polp - 1 YEAR with Dr. [facility] or Tonia Brooms NP

## 2020-04-06 NOTE — Progress Notes (Signed)
Results of CXR called to patient.  He verbalized understanding.

## 2020-04-09 NOTE — Assessment & Plan Note (Signed)
-   Stable, rare SABA use  - Continue Breo Ellipta 100 one puff in the morning (rinse mouth after use)

## 2020-04-09 NOTE — Assessment & Plan Note (Addendum)
-   Recommend patient use saline nasal spray twice daily  - Continue Astelin nasal spray and Flonase as prescribed  - Continue Zyrtec 10mg  once daily and Singulair 1 tablet at bedtime

## 2020-04-09 NOTE — Assessment & Plan Note (Signed)
-   Refer back to ENT, advised patient to contacted [facility] to schedule visit

## 2020-04-09 NOTE — Progress Notes (Signed)
Thanks for seeing him  Josephine Igo, DO [facility] 04/09/2020 11:19 AM

## 2020-04-20 ENCOUNTER — Other Ambulatory Visit: Payer: Self-pay

## 2020-04-20 ENCOUNTER — Ambulatory Visit: Payer: Medicare Other | Admitting: Family Medicine

## 2020-04-20 ENCOUNTER — Encounter: Payer: Self-pay | Admitting: Family Medicine

## 2020-04-20 DIAGNOSIS — M76892 Other specified enthesopathies of left lower limb, excluding foot: Secondary | ICD-10-CM | POA: Diagnosis not present

## 2020-04-20 MED ORDER — DICLOFENAC SODIUM 1.5 % EX SOLN: CUTANEOUS | 1 refills | Status: DC

## 2020-04-20 NOTE — Assessment & Plan Note (Signed)
Patient is doing very well at this time.  Worsening symptoms we consider injections in the knees.  I believe patient will do well with conservative therapy.  Follow-up with me more on an as-needed basis.

## 2020-04-20 NOTE — Patient Instructions (Signed)
Good to see you See me again in 3 months if needed

## 2020-04-20 NOTE — Progress Notes (Signed)
[name] [address] [place] [address] Phone: [phone] Subjective:   [name], am serving as a scribe for Dr. Antoine Primas.  This visit occurred during the SARS-CoV-2 public health emergency.  Safety protocols were in place, including screening questions prior to the visit, additional usage of staff PPE, and extensive cleaning of exam room while observing appropriate contact time as indicated for disinfecting solutions.   I'm seeing this patient by the request  of:  Sheliah Hatch, MD  CC: Knee pain and hamstring follow-up  KGY:[id number]   03/20/2020 Patient has horrible distal hamstring tendinitis.  No sign of any deep venous thrombosis.  No pain in the calf.  Questionable lumbar radiculopathy as well as in the differential.  Patient does have some mild knee arthritis and x-rays are ordered at the moment.  Discussed which activities to do which wants to avoid.  Increase activity slowly.  Follow-up with me again in 4 to 8 weeks due to the posterior pain only did discuss with patient the significant swelling or increasing tenderness patient needs to seek medical attention immediately.  Patient is not on a blood thinner.  Pain is only with activity at this moment.  04/20/2020 [name] is a 85 y.o. male coming in with complaint of bilateral knee pain. Patient states L knee is doing much better than last time. Patient using the pennsaid and it helps. Patient would like the pennsaid sent to [facility] in [place]. Patient states that he is feeling 95% better at this time.  Patient did not need x-rays at last exam.  Independently visualized by me showing moderate arthritic changes and chondrocalcinosis right greater than left    Past Medical History:  Diagnosis Date  . Allergy   . Arthritis    "scattered" (10/26/2018)  . Asthma   . C. difficile diarrhea   . CAD (coronary artery disease) 2005   s/p PCI of RCA  . Carotid  artery stenosis    1-39% bilateral by dopplers 10/2016  . Carotid stenosis    "? side; Dr. Edilia Bo"  . Cataract    NS OD  . Cognitive impairment, mild, so stated   . Colitis   . Erectile dysfunction   . GERD (gastroesophageal reflux disease)   . Hyperlipidemia   . Hypertension   . Perforated stomach, acute 10/23/2018  . Peripheral vascular disease (HCC)   . Retinal detachment   . Vertebrobasilar artery syndrome   . Vertigo    Past Surgical History:  Procedure Laterality Date  . APPENDECTOMY  1954  . CATARACT EXTRACTION Left 08/08/2019   Dr. Hazle Quant  . COLONOSCOPY    . ESOPHAGOGASTRODUODENOSCOPY (EGD) WITH ESOPHAGEAL DILATION  09/2018  . EYE SURGERY    . LAPAROTOMY N/A 10/23/2018   Procedure: EXPLORATORY LAPAROTOMY WITH GRAHAM PATCH FOR PERFORATED ULCER AND PLACEMENT OF GASTRIC TUBE;  Surgeon: Emelia Loron, MD;  Location[facility];  Service: General;  Laterality: N/A;  . NASAL SINUS SURGERY  05/2018  . RETINAL DETACHMENT SURGERY     "? side"  . UPPER GASTROINTESTINAL ENDOSCOPY     Social History   Socioeconomic History  . Marital status: Married    Spouse name: Not on file  . Number of children: 3  . Years of education: Not on file  . Highest education level: Not on file  Occupational History  . Occupation: Retired Teaching laboratory technician: RETIRED  Tobacco Use  . Smoking status:  Former Smoker    Years: 2.00    Types: Cigars    Quit date: 05/13/1961    Years since quitting: 58.9  . Smokeless tobacco: Never Used  . Tobacco comment: "never inhaled" in the early 60's   Vaping Use  . Vaping Use: Never used  Substance and Sexual Activity  . Alcohol use: Yes    Alcohol/week: 14.0 standard drinks    Types: 14 Glasses of wine per week  . Drug use: Never  . Sexual activity: Not Currently  Other Topics Concern  . Not on file  Social History Narrative   Married with 1 son and 2 daughters.  Retired.   2 caffeinated beverages daily, 3 glasses of wine daily    Never smoker no drug use no tobacco   Social Determinants of Health   Financial Resource Strain:   . Difficulty of Paying Living Expenses:   Food Insecurity:   . Worried About Charity fundraiser in the Last Year:   . Arboriculturist in the Last Year:   Transportation Needs:   . Film/video editor (Medical):   Marland Kitchen Lack of Transportation (Non-Medical):   Physical Activity:   . Days of Exercise per Week:   . Minutes of Exercise per Session:   Stress:   . Feeling of Stress :   Social Connections:   . Frequency of Communication with Friends and Family:   . Frequency of Social Gatherings with Friends and Family:   . Attends Religious Services:   . Active Member of Clubs or Organizations:   . Attends Archivist Meetings:   Marland Kitchen Marital Status:    Allergies  Allergen Reactions  . Neosporin [Bacitracin-Polymyxin B] Other (See Comments)    Redness   Family History  Problem Relation Age of Onset  . Stroke Mother   . Hyperlipidemia Mother   . Hypertension Mother   . Deep vein thrombosis Mother   . Stroke Father   . Hyperlipidemia Father   . Hypertension Father   . Deep vein thrombosis Father   . Diverticulitis Father   . Ulcerative colitis Father   . Colon polyps Father   . Colon cancer Neg Hx   . Esophageal cancer Neg Hx   . Stomach cancer Neg Hx   . Rectal cancer Neg Hx      Current Outpatient Medications (Cardiovascular):  .  amLODipine (NORVASC) 5 MG tablet, Take 1 tablet (5 mg total) by mouth daily. Marland Kitchen  atorvastatin (LIPITOR) 80 MG tablet, TAKE 1 TABLET BY MOUTH DAILY .  carvedilol (COREG) 6.25 MG tablet, Take 1 tablet (6.25 mg total) by mouth 2 (two) times daily with a meal. .  hydrochlorothiazide (HYDRODIURIL) 25 MG tablet, TAKE 1 TABLET BY MOUTH DAILY  Current Outpatient Medications (Respiratory):  .  albuterol (PROVENTIL) (2.5 MG/3ML) 0.083% nebulizer solution, Take 3 mLs (2.5 mg total) by nebulization every 6 (six) hours as needed for wheezing or  shortness of breath. Marland Kitchen  albuterol (VENTOLIN HFA) 108 (90 Base) MCG/ACT inhaler, Inhale 2 puffs into the lungs every 6 (six) hours as needed for wheezing or shortness of breath. Marland Kitchen  azelastine (ASTELIN) 0.1 % nasal spray, Place 1 spray into both nostrils 2 (two) times daily. .  cetirizine (ZYRTEC) 10 MG tablet, Take 10 mg by mouth daily. .  fluticasone (FLONASE) 50 MCG/ACT nasal spray, 1 spray each nostril twice daily .  fluticasone furoate-vilanterol (BREO ELLIPTA) 100-25 MCG/INH AEPB, INHALE 1 PUFF INTO THE LUNGS DAILY .  montelukast (SINGULAIR) 10 MG tablet, Take 1 tablet (10 mg total) by mouth at bedtime.  Current Outpatient Medications (Analgesics):  .  acetaminophen (TYLENOL) 500 MG tablet, Take 500 mg by mouth 2 (two) times daily. Marland Kitchen  aspirin EC 81 MG tablet, Take 81 mg by mouth daily.   Current Outpatient Medications (Other):  .  cholecalciferol (VITAMIN D3) 25 MCG (1000 UT) tablet, Take 1,000 Units by mouth daily. Marland Kitchen  donepezil (ARICEPT) 10 MG tablet, Take 1 tablet (10 mg total) by mouth daily. .  Eyelid Cleansers (OCUSOFT EYELID CLEANSING) PADS, Place 1 application into both eyes 2 (two) times daily. .  fish oil-omega-3 fatty acids 1000 MG capsule, Take 2 g by mouth 2 (two) times daily.  Marland Kitchen  gabapentin (NEURONTIN) 100 MG capsule, Take 2 capsules (200 mg total) by mouth at bedtime. .  gabapentin (NEURONTIN) 300 MG capsule, Take 1 capsule (300 mg total) by mouth 3 (three) times daily. Marland Kitchen  LOTEMAX SM 0.38 % GEL,  .  ofloxacin (OCUFLOX) 0.3 % ophthalmic solution, INSTILL 1 DROP IN LEFT EYE FOUR TIMES DAILY FOR 7 DAYS BEGINNING AFTER SURGERY .  Polyvinyl Alcohol-Povidone (REFRESH OP), Place 1 drop into both eyes 2 (two) times daily. .  prednisoLONE acetate (PRED FORTE) 1 % ophthalmic suspension, Place 1 drop into the left eye every hour while awake. Marland Kitchen  Respiratory Therapy Supplies (FLUTTER) DEVI, 1 Device by Does not apply route daily. .  Saw Palmetto 500 MG CAPS, Take 1,000 mg by mouth 2  (two) times daily.  Marland Kitchen  Spacer/Aero-Holding Rudean Curt, Use as directed. .  Diclofenac Sodium 1.5 % SOLN, Fingertip size amount over most painful spot 2 times daily   Reviewed prior external information including notes and imaging from  primary care provider As well as notes that were available from care everywhere and other healthcare systems.  Past medical history, social, surgical and family history all reviewed in electronic medical record.  No pertanent information unless stated regarding to the chief complaint.   Review of Systems:  No headache, visual changes, nausea, vomiting, diarrhea, constipation, dizziness, abdominal pain, skin rash, fevers, chills, night sweats, weight loss, swollen lymph nodes, body aches, joint swelling, chest pain, shortness of breath, mood changes. POSITIVE muscle aches  Objective  Blood pressure 120/80, pulse (!) 58, height 5\' 5"  (1.651 m), weight 147 lb (66.7 kg), SpO2 98 %.   General: No apparent distress alert and oriented x3 mood and affect normal, dressed appropriately.  HEENT: Pupils equal, extraocular movements intact  Respiratory: Patient's speak in full sentences and does not appear short of breath  Cardiovascular: No lower extremity edema, non tender, no erythema  Neuro: Cranial nerves II through XII are intact, neurovascularly intact in all extremities with 2+ DTRs and 2+ pulses.  Gait mild antalgic MSK: Knees do show some arthritic changes but no significant instability.  Patient's hamstring is no longer tender at the moment.    Impression and Recommendations:     The above documentation has been reviewed and is accurate and complete , DO       Note: This dictation was prepared with Dragon dictation along with smaller phrase technology. Any transcriptional errors that result from this process are unintentional.

## 2020-05-01 ENCOUNTER — Ambulatory Visit: Payer: Medicare Other | Admitting: Physician Assistant

## 2020-05-01 ENCOUNTER — Encounter: Payer: Self-pay | Admitting: Physician Assistant

## 2020-05-01 ENCOUNTER — Other Ambulatory Visit: Payer: Self-pay

## 2020-05-01 VITALS — BP 112/60 | HR 59 | Temp 98.0°F | Resp 16 | Ht 65.0 in | Wt 147.0 lb

## 2020-05-01 DIAGNOSIS — L03115 Cellulitis of right lower limb: Secondary | ICD-10-CM

## 2020-05-01 DIAGNOSIS — S81801A Unspecified open wound, right lower leg, initial encounter: Secondary | ICD-10-CM

## 2020-05-01 MED ORDER — CEPHALEXIN 500 MG PO CAPS: 500.0000 mg | ORAL_CAPSULE | Freq: Two times a day (BID) | ORAL | 0 refills | Status: AC

## 2020-05-01 NOTE — Progress Notes (Signed)
Patient presents to clinic today c/o a scrape of his right shin occurring 1 week ago when he fell on concrete. Denies any head trauma.  States the fall occurred because he slipped on a slick patch, falling forward onto the leg.  Endorses immediately cleaning the area.  Since then his wife has been applying OTC iodine solution to the area since he is allergic to Neosporin ingredient.  Has been letting water run over the area in the shower but has not been directly cleaning the area.  Has noted some increasing tenderness mild swelling around the area with redness.  Denies drainage from the area.  Denies fever, chills, malaise or fatigue.  Tetanus updated in 2020.  Past Medical History:  Diagnosis Date   Allergy    Arthritis    "scattered" (10/26/2018)   Asthma    C. difficile diarrhea    CAD (coronary artery disease) 2005   s/p PCI of RCA   Carotid artery stenosis    1-39% bilateral by dopplers 10/2016   Carotid stenosis    "? side; Dr. Edilia Bo"   Cataract    NS OD   Cognitive impairment, mild, so stated    Colitis    Erectile dysfunction    GERD (gastroesophageal reflux disease)    Hyperlipidemia    Hypertension    Perforated stomach, acute 10/23/2018   Peripheral vascular disease (HCC)    Retinal detachment    Vertebrobasilar artery syndrome    Vertigo     Current Outpatient Medications on File Prior to Visit  Medication Sig Dispense Refill   acetaminophen (TYLENOL) 500 MG tablet Take 500 mg by mouth 2 (two) times daily.     albuterol (PROVENTIL) (2.5 MG/3ML) 0.083% nebulizer solution Take 3 mLs (2.5 mg total) by nebulization every 6 (six) hours as needed for wheezing or shortness of breath. 90 vial 3   albuterol (VENTOLIN HFA) 108 (90 Base) MCG/ACT inhaler Inhale 2 puffs into the lungs every 6 (six) hours as needed for wheezing or shortness of breath. 6.7 g 5   amLODipine (NORVASC) 5 MG tablet Take 1 tablet (5 mg total) by mouth daily. 90 tablet 3    aspirin EC 81 MG tablet Take 81 mg by mouth daily.     atorvastatin (LIPITOR) 80 MG tablet TAKE 1 TABLET BY MOUTH DAILY 90 tablet 0   azelastine (ASTELIN) 0.1 % nasal spray Place 1 spray into both nostrils 2 (two) times daily. 30 mL 2   carvedilol (COREG) 6.25 MG tablet Take 1 tablet (6.25 mg total) by mouth 2 (two) times daily with a meal. 180 tablet 2   cetirizine (ZYRTEC) 10 MG tablet Take 10 mg by mouth daily.     cholecalciferol (VITAMIN D3) 25 MCG (1000 UT) tablet Take 1,000 Units by mouth daily.     Diclofenac Sodium 1.5 % SOLN Fingertip size amount over most painful spot 2 times daily 150 mL 1   donepezil (ARICEPT) 10 MG tablet Take 1 tablet (10 mg total) by mouth daily. 90 tablet 0   Eyelid Cleansers (OCUSOFT EYELID CLEANSING) PADS Place 1 application into both eyes 2 (two) times daily.     fish oil-omega-3 fatty acids 1000 MG capsule Take 2 g by mouth 2 (two) times daily.      fluticasone (FLONASE) 50 MCG/ACT nasal spray 1 spray each nostril twice daily 16 g 5   fluticasone furoate-vilanterol (BREO ELLIPTA) 100-25 MCG/INH AEPB INHALE 1 PUFF INTO THE LUNGS DAILY 60 each 3  gabapentin (NEURONTIN) 100 MG capsule Take 2 capsules (200 mg total) by mouth at bedtime. 180 capsule 1   gabapentin (NEURONTIN) 300 MG capsule Take 1 capsule (300 mg total) by mouth 3 (three) times daily. 90 capsule 3   hydrochlorothiazide (HYDRODIURIL) 25 MG tablet TAKE 1 TABLET BY MOUTH DAILY 90 tablet 3   LOTEMAX SM 0.38 % GEL      montelukast (SINGULAIR) 10 MG tablet Take 1 tablet (10 mg total) by mouth at bedtime. 30 tablet 11   ofloxacin (OCUFLOX) 0.3 % ophthalmic solution INSTILL 1 DROP IN LEFT EYE FOUR TIMES DAILY FOR 7 DAYS BEGINNING AFTER SURGERY     Polyvinyl Alcohol-Povidone (REFRESH OP) Place 1 drop into both eyes 2 (two) times daily.     prednisoLONE acetate (PRED FORTE) 1 % ophthalmic suspension Place 1 drop into the left eye every hour while awake. 15 mL 0   Respiratory Therapy  Supplies (FLUTTER) DEVI 1 Device by Does not apply route daily. 1 each 0   Saw Palmetto 500 MG CAPS Take 1,000 mg by mouth 2 (two) times daily.      Spacer/Aero-Holding [facility] Use as directed. 1 each 0   No current facility-administered medications on file prior to visit.    Allergies  Allergen Reactions   Neosporin [Bacitracin-Polymyxin B] Other (See Comments)    Redness    Family History  Problem Relation Age of Onset   Stroke Mother    Hyperlipidemia Mother    Hypertension Mother    Deep vein thrombosis Mother    Stroke Father    Hyperlipidemia Father    Hypertension Father    Deep vein thrombosis Father    Diverticulitis Father    Ulcerative colitis Father    Colon polyps Father    Colon cancer Neg Hx    Esophageal cancer Neg Hx    Stomach cancer Neg Hx    Rectal cancer Neg Hx     Social History   Socioeconomic History   Marital status: Married    Spouse name: Not on file   Number of children: 3   Years of education: Not on file   Highest education level: Not on file  Occupational History   Occupation: Retired Teaching laboratory technician: RETIRED  Tobacco Use   Smoking status: Former Smoker    Years: 2.00    Types: Cigars    Quit date: 05/13/1961    Years since quitting: 59.0   Smokeless tobacco: Never Used   Tobacco comment: "never inhaled" in the early 60's   Vaping Use   Vaping Use: Never used  Substance and Sexual Activity   Alcohol use: Yes    Alcohol/week: 14.0 standard drinks    Types: 14 Glasses of wine per week   Drug use: Never   Sexual activity: Not Currently  Other Topics Concern   Not on file  Social History Narrative   Married with 1 son and 2 daughters.  Retired.   2 caffeinated beverages daily, 3 glasses of wine daily   Never smoker no drug use no tobacco   Social Determinants of Corporate investment banker Strain:    Difficulty of Paying Living Expenses:   Food Insecurity:     Worried About Programme researcher, broadcasting/film/video in the Last Year:    Barista in the Last Year:   Transportation Needs:    Freight forwarder (Medical):    Lack of Transportation (Non-Medical):   Physical  Activity:    Days of Exercise per Week:    Minutes of Exercise per Session:   Stress:    Feeling of Stress :   Social Connections:    Frequency of Communication with Friends and Family:    Frequency of Social Gatherings with Friends and Family:    Attends Religious Services:    Active Member of Clubs or Organizations:    Attends Banker Meetings:    Marital Status:    Review of Systems - See HPI.  All other ROS are negative.  BP 112/60    Pulse (!) 59    Temp 98 F (36.7 C) (Temporal)    Resp 16    Ht 5\' 5"  (1.651 m)    Wt 147 lb (66.7 kg)    SpO2 95%    BMI 24.46 kg/m   Physical Exam Vitals reviewed.  Constitutional:      Appearance: Normal appearance.  Eyes:     Conjunctiva/sclera: Conjunctivae normal.  Cardiovascular:     Rate and Rhythm: Normal rate and regular rhythm.     Heart sounds: Normal heart sounds.  Pulmonary:     Effort: Pulmonary effort is normal.  Musculoskeletal:     Cervical back: Neck supple.  Skin:      Neurological:     General: No focal deficit present.     Mental Status: He is alert and oriented to person, place, and time. Mental status is at baseline.      Assessment/Plan: 1. Avulsion of skin of right lower leg, initial encounter 2. Cellulitis of leg without foot, right 1 week out from injury.  Superficial avulsion with improper cleaning now with evidence of mild cellulitis.  Area cleaned with sterile saline and bandaged in office.  Home wound care instructions given to patient.  Rx Keflex 500 mg twice daily x7 days.  Patient has follow-up scheduled in 1 week with PCP.  Discussed with him if he noted any worsening symptoms or if new symptoms develop (drainage or fever, etc.) he is to come see ASAP.  Patient voiced  understanding and agreement with the plan.  Written instructions were provided for the patient  This visit occurred during the SARS-CoV-2 public health emergency.  Safety protocols were in place, including screening questions prior to the visit, additional usage of staff PPE, and extensive cleaning of exam room while observing appropriate contact time as indicated for disinfecting solutions.     [place], PA-C

## 2020-05-01 NOTE — Patient Instructions (Signed)
Please keep skin clean and dry  -- wash the area daily with clean, warm water and small amount of soap. Pat completely dry. Since you are allergic to neosporin and bacitracin, I would just cover the area with a clean bandaged as shown during your visit today. You can use the solution you have at home but we do not want to keep the wound too "wet" as this can delay healing.   I am sending in an antibiotic for you to take as directed.  Follow-up in 1 week as scheduled with Dr. Beverely Low.   If you note any increasing redness or tenderness or you note new symptoms, please notify us immediately.

## 2020-05-04 ENCOUNTER — Other Ambulatory Visit: Payer: Self-pay

## 2020-05-04 MED ORDER — GABAPENTIN 300 MG PO CAPS: 300.0000 mg | ORAL_CAPSULE | Freq: Three times a day (TID) | ORAL | 3 refills | Status: DC

## 2020-05-07 ENCOUNTER — Other Ambulatory Visit: Payer: Self-pay

## 2020-05-07 ENCOUNTER — Ambulatory Visit (INDEPENDENT_AMBULATORY_CARE_PROVIDER_SITE_OTHER): Payer: Medicare Other | Admitting: Family Medicine

## 2020-05-07 ENCOUNTER — Encounter: Payer: Self-pay | Admitting: Family Medicine

## 2020-05-07 VITALS — BP 121/74 | HR 78 | Temp 97.9°F | Resp 16 | Ht 65.0 in | Wt 144.5 lb

## 2020-05-07 DIAGNOSIS — I10 Essential (primary) hypertension: Secondary | ICD-10-CM

## 2020-05-07 DIAGNOSIS — Z0000 Encounter for general adult medical examination without abnormal findings: Secondary | ICD-10-CM

## 2020-05-07 LAB — CBC WITH DIFFERENTIAL/PLATELET
Basophils Absolute: 0.1 10*3/uL (ref 0.0–0.1)
Basophils Relative: 0.9 % (ref 0.0–3.0)
Eosinophils Absolute: 0.1 10*3/uL (ref 0.0–0.7)
Eosinophils Relative: 1.3 % (ref 0.0–5.0)
HCT: 42 % (ref 39.0–52.0)
Hemoglobin: 14.4 g/dL (ref 13.0–17.0)
Lymphocytes Relative: 27.4 % (ref 12.0–46.0)
Lymphs Abs: 1.6 10*3/uL (ref 0.7–4.0)
MCHC: 34.3 g/dL (ref 30.0–36.0)
MCV: 88.3 fl (ref 78.0–100.0)
Monocytes Absolute: 0.7 10*3/uL (ref 0.1–1.0)
Monocytes Relative: 11.8 % (ref 3.0–12.0)
Neutro Abs: 3.5 10*3/uL (ref 1.4–7.7)
Neutrophils Relative %: 58.6 % (ref 43.0–77.0)
Platelets: 187 10*3/uL (ref 150.0–400.0)
RBC: 4.76 Mil/uL (ref 4.22–5.81)
RDW: 13.6 % (ref 11.5–15.5)
WBC: 6 10*3/uL (ref 4.0–10.5)

## 2020-05-07 LAB — LIPID PANEL
Cholesterol: 113 mg/dL (ref 0–200)
HDL: 41 mg/dL (ref >39.00)
LDL Cholesterol: 52 mg/dL (ref 0–99)
NonHDL: 72.47
Total CHOL/HDL Ratio: 3
Triglycerides: 101 mg/dL (ref 0.0–149.0)
VLDL: 20.2 mg/dL (ref 0.0–40.0)

## 2020-05-07 LAB — HEPATIC FUNCTION PANEL
ALT: 20 U/L (ref 0–53)
AST: 21 U/L (ref 0–37)
Albumin: 4.3 g/dL (ref 3.5–5.2)
Alkaline Phosphatase: 82 U/L (ref 39–117)
Bilirubin, Direct: 0.1 mg/dL (ref 0.0–0.3)
Total Bilirubin: 0.5 mg/dL (ref 0.2–1.2)
Total Protein: 6.7 g/dL (ref 6.0–8.3)

## 2020-05-07 LAB — TSH: TSH: 3.32 u[IU]/mL (ref 0.35–4.50)

## 2020-05-07 LAB — BASIC METABOLIC PANEL
BUN: 22 mg/dL (ref 6–23)
CO2: 32 mEq/L (ref 19–32)
Calcium: 9.2 mg/dL (ref 8.4–10.5)
Chloride: 101 mEq/L (ref 96–112)
Creatinine, Ser: 1.06 mg/dL (ref 0.40–1.50)
GFR: 66.74 mL/min (ref >60.00)
Glucose, Bld: 91 mg/dL (ref 70–99)
Potassium: 3.9 mEq/L (ref 3.5–5.1)
Sodium: 140 mEq/L (ref 135–145)

## 2020-05-07 NOTE — Patient Instructions (Addendum)
Follow up in 6 months to recheck blood pressure and cholesterol We'll notify you of your lab results and make any changes if needed Continue to work on healthy diet and regular exercise- you're doing great! You are up to date on immunizations- yay!!! Call with any questions or concerns Have a great summer!!

## 2020-05-07 NOTE — Assessment & Plan Note (Signed)
Pt's PE unchanged from previous.  UTD on immunizations.  No longer doing colon cancer screening.  Check labs.  Anticipatory guidance provided.

## 2020-05-07 NOTE — Assessment & Plan Note (Signed)
Chronic problem.  Well controlled.  Asymptomatic.  Check labs.  No anticipated med changes.  Will follow. 

## 2020-05-07 NOTE — Progress Notes (Signed)
   Subjective:    Patient ID: [name], male    DOB: December 03, 1934, 85 y.o.   MRN: [id number]  HPI CPE- UTD on immunizations.  No longer doing colon cancer screening.  Following w/ Dr Mena Goes   Review of Systems Patient reports no vision/hearing changes, anorexia, fever ,adenopathy, persistant/recurrent hoarseness, swallowing issues, chest pain, palpitations, edema, hemoptysis, dyspnea (rest,exertional, paroxysmal nocturnal), gastrointestinal  bleeding (melena, rectal bleeding), abdominal pain, excessive heart burn, GU symptoms (dysuria, hematuria, voiding/incontinence issues) syncope, focal weakness, memory loss, numbness & tingling, skin/hair/nail changes, depression, anxiety, abnormal bruising/bleeding, musculoskeletal symptoms/signs.   + chronic cough  This visit occurred during the SARS-CoV-2 public health emergency.  Safety protocols were in place, including screening questions prior to the visit, additional usage of staff PPE, and extensive cleaning of exam room while observing appropriate contact time as indicated for disinfecting solutions.       Objective:   Physical Exam General Appearance:    Alert, cooperative, no distress, appears stated age  Head:    Normocephalic, without obvious abnormality, atraumatic  Eyes:    PERRL, conjunctiva/corneas clear, EOM's intact, fundi    benign, both eyes       Ears:    Normal TM's and external ear canals, both ears  Nose:   Deferred due to COVID  Throat:   Neck:   Supple, symmetrical, trachea midline, no adenopathy;       thyroid:  No enlargement/tenderness/nodules  Back:     Symmetric, no curvature, ROM normal, no CVA tenderness  Lungs:     Clear to auscultation bilaterally, respirations unlabored  Chest wall:    No tenderness or deformity  Heart:    Regular rate and rhythm, S1 and S2 normal, no murmur, rub   or gallop  Abdomen:     Soft, non-tender, bowel sounds active all four quadrants,    no masses, no organomegaly  Genitalia:     Deferred to urology  Rectal:    Extremities:   Extremities normal, atraumatic, no cyanosis or edema  Pulses:   2+ and symmetric all extremities  Skin:   Skin color, texture, turgor normal, no rashes or lesions  Lymph nodes:   Cervical, supraclavicular, and axillary nodes normal  Neurologic:   CNII-XII intact. Normal strength, sensation and reflexes      throughout          Assessment & Plan:

## 2020-05-08 ENCOUNTER — Encounter: Payer: Self-pay | Admitting: General Practice

## 2020-05-11 ENCOUNTER — Other Ambulatory Visit: Payer: Self-pay | Admitting: Cardiology

## 2020-06-01 DIAGNOSIS — N400 Enlarged prostate without lower urinary tract symptoms: Secondary | ICD-10-CM | POA: Diagnosis not present

## 2020-06-12 ENCOUNTER — Telehealth (INDEPENDENT_AMBULATORY_CARE_PROVIDER_SITE_OTHER): Payer: Medicare Other | Admitting: Physician Assistant

## 2020-06-12 ENCOUNTER — Other Ambulatory Visit: Payer: Self-pay

## 2020-06-12 ENCOUNTER — Encounter: Payer: Self-pay | Admitting: Physician Assistant

## 2020-06-12 DIAGNOSIS — B9689 Other specified bacterial agents as the cause of diseases classified elsewhere: Secondary | ICD-10-CM

## 2020-06-12 DIAGNOSIS — J019 Acute sinusitis, unspecified: Secondary | ICD-10-CM | POA: Diagnosis not present

## 2020-06-12 DIAGNOSIS — J310 Chronic rhinitis: Secondary | ICD-10-CM | POA: Diagnosis not present

## 2020-06-12 MED ORDER — AMOXICILLIN-POT CLAVULANATE 875-125 MG PO TABS: 1.0000 | ORAL_TABLET | Freq: Two times a day (BID) | ORAL | 0 refills | Status: DC

## 2020-06-12 MED ORDER — LEVOCETIRIZINE DIHYDROCHLORIDE 5 MG PO TABS: 5.0000 mg | ORAL_TABLET | Freq: Every evening | ORAL | 1 refills | Status: DC

## 2020-06-12 NOTE — Progress Notes (Signed)
I have discussed the procedure for the virtual visit with the patient who has given consent to proceed with assessment and treatment.   Jartavious Mckimmy S Acad[place] Thammavong, CMA     

## 2020-06-12 NOTE — Progress Notes (Signed)
Virtual Visit via Video   I connected with patient on 06/12/20 at 11:30 AM EDT by a video enabled telemedicine application and verified that I am speaking with the correct person using two identifiers.  Location patient: Home Location provider: [place] [facility] Persons participating in the virtual visit: Patient, Provider, CMA (Patina Moore)  I discussed the limitations of evaluation and management by telemedicine and the availability of in person appointments. The patient expressed understanding and agreed to proceed.  Subjective:   HPI:   Patient presents via [facility] today c/o ongoing sinus issues -- daily nasal congestion, sinus pressure, runny nose. Denies itchy or watery eyes. Denies sinus pain, tooth pain. Denies chest congestion or SOB. Does not dry coughing spells. Notes thick yellow discharge over the past 2 months. Has previously seen ENT (Dr. Jearld Fenton at [facility]) for nasal polyps and chronic sinuses. Is taking Zyrtec daily along with his Flonase and Astelin sprays.   ROS:   See pertinent positives and negatives per HPI.  Patient Active Problem List   Diagnosis Date Noted  . Hamstring tendinitis of left thigh 03/20/2020  . Nasal polyps 06/07/2019  . Restrictive lung disease 11/15/2018  . Rhinitis, chronic 11/15/2018  . History of ETT 10/23/2018  . Globus sensation 07/29/2018  . Preoperative cardiovascular examination 04/16/2018  . Arthropathy of right sacroiliac joint 04/08/2016  . Spinal stenosis, lumbar region, with neurogenic claudication 03/26/2016  . Sciatica of right side 06/13/2015  . CAD (coronary artery disease)   . Rotator cuff syndrome of left shoulder 01/27/2013  . Carotid artery stenosis 09/02/2012  . Physical exam, annual 06/23/2012  . Chronic cough 11/23/2009  . SKIN LESIONS, MULTIPLE 06/05/2009  . 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 DISEASE, PERIPHERAL VASCULAR NOS 08/02/2007  . VERTIGO 08/02/2007  . Mild cognitive impairment  04/27/2007  . Hyperlipidemia 03/17/2007  . Essential hypertension 03/17/2007    Social History   Tobacco Use  . Smoking status: Former Smoker    Years: 2.00    Types: Cigars    Quit date: 05/13/1961    Years since quitting: 59.1  . Smokeless tobacco: Never Used  . Tobacco comment: "never inhaled" in the early 60's   Substance Use Topics  . Alcohol use: Yes    Alcohol/week: 14.0 standard drinks    Types: 14 Glasses of wine per week    Current Outpatient Medications:  .  acetaminophen (TYLENOL) 500 MG tablet, Take 500 mg by mouth 2 (two) times daily., Disp: , Rfl:  .  albuterol (PROVENTIL) (2.5 MG/3ML) 0.083% nebulizer solution, Take 3 mLs (2.5 mg total) by nebulization every 6 (six) hours as needed for wheezing or shortness of breath., Disp: 90 vial, Rfl: 3 .  albuterol (VENTOLIN HFA) 108 (90 Base) MCG/ACT inhaler, Inhale 2 puffs into the lungs every 6 (six) hours as needed for wheezing or shortness of breath., Disp: 6.7 g, Rfl: 5 .  amLODipine (NORVASC) 5 MG tablet, Take 1 tablet (5 mg total) by mouth daily., Disp: 90 tablet, Rfl: 3 .  aspirin EC 81 MG tablet, Take 81 mg by mouth daily., Disp: , Rfl:  .  atorvastatin (LIPITOR) 80 MG tablet, TAKE 1 TABLET BY MOUTH DAILY, Disp: 90 tablet, Rfl: 3 .  azelastine (ASTELIN) 0.1 % nasal spray, Place 1 spray into both nostrils 2 (two) times daily., Disp: 30 mL, Rfl: 2 .  carvedilol (COREG) 6.25 MG tablet, Take 1 tablet (6.25 mg total) by mouth 2 (two) times daily with a meal., Disp: 180 tablet, Rfl:  2 .  cetirizine (ZYRTEC) 10 MG tablet, Take 10 mg by mouth daily., Disp: , Rfl:  .  cholecalciferol (VITAMIN D3) 25 MCG (1000 UT) tablet, Take 1,000 Units by mouth daily., Disp: , Rfl:  .  Diclofenac Sodium 1.5 % SOLN, Fingertip size amount over most painful spot 2 times daily, Disp: 150 mL, Rfl: 1 .  donepezil (ARICEPT) 10 MG tablet, Take 1 tablet (10 mg total) by mouth daily., Disp: 90 tablet, Rfl: 0 .  Eyelid Cleansers (OCUSOFT EYELID CLEANSING)  PADS, Place 1 application into both eyes 2 (two) times daily., Disp: , Rfl:  .  fish oil-omega-3 fatty acids 1000 MG capsule, Take 2 g by mouth 2 (two) times daily. , Disp: , Rfl:  .  fluticasone (FLONASE) 50 MCG/ACT nasal spray, 1 spray each nostril twice daily, Disp: 16 g, Rfl: 5 .  fluticasone furoate-vilanterol (BREO ELLIPTA) 100-25 MCG/INH AEPB, INHALE 1 PUFF INTO THE LUNGS DAILY, Disp: 60 each, Rfl: 3 .  gabapentin (NEURONTIN) 100 MG capsule, Take 2 capsules (200 mg total) by mouth at bedtime., Disp: 180 capsule, Rfl: 1 .  gabapentin (NEURONTIN) 300 MG capsule, Take 1 capsule (300 mg total) by mouth 3 (three) times daily., Disp: 90 capsule, Rfl: 3 .  hydrochlorothiazide (HYDRODIURIL) 25 MG tablet, TAKE 1 TABLET BY MOUTH DAILY, Disp: 90 tablet, Rfl: 3 .  LOTEMAX SM 0.38 % GEL, , Disp: , Rfl:  .  montelukast (SINGULAIR) 10 MG tablet, Take 1 tablet (10 mg total) by mouth at bedtime., Disp: 30 tablet, Rfl: 11 .  ofloxacin (OCUFLOX) 0.3 % ophthalmic solution, INSTILL 1 DROP IN LEFT EYE FOUR TIMES DAILY FOR 7 DAYS BEGINNING AFTER SURGERY, Disp: , Rfl:  .  Polyvinyl Alcohol-Povidone (REFRESH OP), Place 1 drop into both eyes 2 (two) times daily., Disp: , Rfl:  .  prednisoLONE acetate (PRED FORTE) 1 % ophthalmic suspension, Place 1 drop into the left eye every hour while awake., Disp: 15 mL, Rfl: 0 .  Respiratory Therapy Supplies (FLUTTER) DEVI, 1 Device by Does not apply route daily., Disp: 1 each, Rfl: 0 .  Saw Palmetto 500 MG CAPS, Take 1,000 mg by mouth 2 (two) times daily. , Disp: , Rfl:  .  Spacer/Aero-Holding Chambers DEVI, Use as directed., Disp: 1 each, Rfl: 0  Allergies  Allergen Reactions  . Neosporin [Bacitracin-Polymyxin B] Other (See Comments)    Redness    Objective:   There were no vitals taken for this visit.  Patient is well-developed, well-nourished in no acute distress.  Resting comfortably at home.  Head is normocephalic, atraumatic.  No labored breathing.  Speech is  clear and coherent with logical content.  Patient is alert and oriented at baseline.  + TTP frontal sinuses  Assessment and Plan:   1. Rhinitis, chronic Ongoing issue despite conservative measures. Switch Zyrtec for Xyzal. Continue saline rinse, Flonase and Astelin. Referral to ENT placed.  - Ambulatory referral to ENT  2. Acute bacterial sinusitis Rx Augmentin.  Increase fluids.  Rest.  Saline nasal spray.  Probiotic.  Mucinex as directed.  Humidifier in bedroom. Xyzal as directed.  Call or return to clinic if symptoms are not improving.     Piedad Climes, PA-C 06/12/2020

## 2020-06-12 NOTE — Patient Instructions (Signed)
Instructions sent to MyChart

## 2020-06-18 DIAGNOSIS — Z961 Presence of intraocular lens: Secondary | ICD-10-CM | POA: Diagnosis not present

## 2020-06-18 DIAGNOSIS — H18593 Other hereditary corneal dystrophies, bilateral: Secondary | ICD-10-CM | POA: Diagnosis not present

## 2020-06-18 DIAGNOSIS — H43813 Vitreous degeneration, bilateral: Secondary | ICD-10-CM | POA: Diagnosis not present

## 2020-06-22 ENCOUNTER — Other Ambulatory Visit: Payer: Self-pay | Admitting: General Practice

## 2020-06-22 MED ORDER — DONEPEZIL HCL 10 MG PO TABS: 10.0000 mg | ORAL_TABLET | Freq: Every day | ORAL | 1 refills | Status: DC

## 2020-06-26 ENCOUNTER — Encounter: Payer: Self-pay | Admitting: Physician Assistant

## 2020-06-26 ENCOUNTER — Ambulatory Visit (INDEPENDENT_AMBULATORY_CARE_PROVIDER_SITE_OTHER): Payer: Medicare Other | Admitting: Physician Assistant

## 2020-06-26 ENCOUNTER — Other Ambulatory Visit: Payer: Self-pay

## 2020-06-26 VITALS — BP 118/70 | HR 58 | Temp 97.9°F | Resp 16 | Ht 65.0 in | Wt 145.0 lb

## 2020-06-26 DIAGNOSIS — W57XXXA Bitten or stung by nonvenomous insect and other nonvenomous arthropods, initial encounter: Secondary | ICD-10-CM

## 2020-06-26 DIAGNOSIS — S70362A Insect bite (nonvenomous), left thigh, initial encounter: Secondary | ICD-10-CM

## 2020-06-26 MED ORDER — DOXYCYCLINE HYCLATE 100 MG PO TABS: 100.0000 mg | ORAL_TABLET | Freq: Two times a day (BID) | ORAL | 0 refills | Status: DC

## 2020-06-26 NOTE — Patient Instructions (Signed)
Please keep skin clean and dry. Can use peroxide to the area for the next day or two. Take the antibiotic as directed as there is sign of skin infection.  The antibiotic should cover tick borne illnesses even though you should be at low risk giving how short of a time the tick was attached.   Let me know if you notice any new or worsening symptoms.

## 2020-06-26 NOTE — Progress Notes (Signed)
Patient presents to clinic today c/o insect bite to upper medial L thigh first noted this morning. Notes was not there yesterday morning when taking a shower. 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 Denies pain or itching. Denies fever, chills, malaise or fatigue. Denies headache, joint aches or rash.   Past Medical History:  Diagnosis Date  . Allergy   . Arthritis    "scattered" (10/26/2018)  . Asthma   . C. difficile diarrhea   . CAD (coronary artery disease) 2005   s/p PCI of RCA  . Carotid artery stenosis    1-39% bilateral by dopplers 10/2016  . Carotid stenosis    "? side; Dr. Edilia Bo"  . Cataract    NS OD  . Cognitive impairment, mild, so stated   . Colitis   . Erectile dysfunction   . GERD (gastroesophageal reflux disease)   . Hyperlipidemia   . Hypertension   . Perforated stomach, acute 10/23/2018  . Peripheral vascular disease (HCC)   . Retinal detachment   . Vertebrobasilar artery syndrome   . Vertigo     Current Outpatient Medications on File Prior to Visit  Medication Sig Dispense Refill  . acetaminophen (TYLENOL) 500 MG tablet Take 500 mg by mouth 2 (two) times daily.    Marland Kitchen albuterol (PROVENTIL) (2.5 MG/3ML) 0.083% nebulizer solution Take 3 mLs (2.5 mg total) by nebulization every 6 (six) hours as needed for wheezing or shortness of breath. 90 vial 3  . albuterol (VENTOLIN HFA) 108 (90 Base) MCG/ACT inhaler Inhale 2 puffs into the lungs every 6 (six) hours as needed for wheezing or shortness of breath. 6.7 g 5  . amLODipine (NORVASC) 5 MG tablet Take 1 tablet (5 mg total) by mouth daily. 90 tablet 3  . aspirin EC 81 MG tablet Take 81 mg by mouth daily.    Marland Kitchen atorvastatin (LIPITOR) 80 MG tablet TAKE 1 TABLET BY MOUTH DAILY 90 tablet 3  . azelastine (ASTELIN) 0.1 % nasal spray Place 1 spray into both nostrils 2 (two) times daily. 30 mL 2  . carvedilol (COREG) 6.25 MG tablet Take 1 tablet (6.25 mg total) by mouth 2 (two) times daily with a  meal. 180 tablet 2  . cholecalciferol (VITAMIN D3) 25 MCG (1000 UT) tablet Take 1,000 Units by mouth daily.    . Diclofenac Sodium 1.5 % SOLN Fingertip size amount over most painful spot 2 times daily 150 mL 1  . donepezil (ARICEPT) 10 MG tablet Take 1 tablet (10 mg total) by mouth daily. 90 tablet 1  . Eyelid Cleansers (OCUSOFT EYELID CLEANSING) PADS Place 1 application into both eyes 2 (two) times daily.    . fish oil-omega-3 fatty acids 1000 MG capsule Take 2 g by mouth 2 (two) times daily.     . fluticasone (FLONASE) 50 MCG/ACT nasal spray 1 spray each nostril twice daily 16 g 5  . fluticasone furoate-vilanterol (BREO ELLIPTA) 100-25 MCG/INH AEPB INHALE 1 PUFF INTO THE LUNGS DAILY 60 each 3  . gabapentin (NEURONTIN) 100 MG capsule Take 2 capsules (200 mg total) by mouth at bedtime. 180 capsule 1  . gabapentin (NEURONTIN) 300 MG capsule Take 1 capsule (300 mg total) by mouth 3 (three) times daily. 90 capsule 3  . hydrochlorothiazide (HYDRODIURIL) 25 MG tablet TAKE 1 TABLET BY MOUTH DAILY 90 tablet 3  . levocetirizine (XYZAL) 5 MG tablet Take 1 tablet (5 mg total) by mouth every evening. 30 tablet 1  . LOTEMAX SM  0.38 % GEL     . montelukast (SINGULAIR) 10 MG tablet Take 1 tablet (10 mg total) by mouth at bedtime. 30 tablet 11  . ofloxacin (OCUFLOX) 0.3 % ophthalmic solution INSTILL 1 DROP IN LEFT EYE FOUR TIMES DAILY FOR 7 DAYS BEGINNING AFTER SURGERY    . Polyvinyl Alcohol-Povidone (REFRESH OP) Place 1 drop into both eyes 2 (two) times daily.    . prednisoLONE acetate (PRED FORTE) 1 % ophthalmic suspension Place 1 drop into the left eye every hour while awake. 15 mL 0  . Respiratory Therapy Supplies (FLUTTER) DEVI 1 Device by Does not apply route daily. 1 each 0  . Saw Palmetto 500 MG CAPS Take 1,000 mg by mouth 2 (two) times daily.     Marland Kitchen Spacer/Aero-[facility]olding [facility] Use as directed. 1 each 0   No current facility-administered medications on file prior to visit.    Allergies    Allergen Reactions  . Neosporin [Bacitracin-Polymyxin B] Other (See Comments)    Redness    Family History  Problem Relation Age of Onset  . Stroke Mother   . Hyperlipidemia Mother   . Hypertension Mother   . Deep vein thrombosis Mother   . Stroke Father   . Hyperlipidemia Father   . Hypertension Father   . Deep vein thrombosis Father   . Diverticulitis Father   . Ulcerative colitis Father   . Colon polyps Father   . Colon cancer Neg Hx   . Esophageal cancer Neg Hx   . Stomach cancer Neg Hx   . Rectal cancer Neg Hx     Social History   Socioeconomic History  . Marital status: Married    Spouse name: Not on file  . Number of children: 3  . Years of education: Not on file  . Highest education level: Not on file  Occupational History  . Occupation: Retired Teaching laboratory technician: RETIRED  Tobacco Use  . Smoking status: Former Smoker    Years: 2.00    Types: Cigars    Quit date: 05/13/1961    Years since quitting: 59.1  . Smokeless tobacco: Never Used  . Tobacco comment: "never inhaled" in the early 60's   Vaping Use  . Vaping Use: Never used  Substance and Sexual Activity  . Alcohol use: Yes    Alcohol/week: 14.0 standard drinks    Types: 14 Glasses of wine per week  . Drug use: Never  . Sexual activity: Not Currently  Other Topics Concern  . Not on file  Social History Narrative   Married with 1 son and 2 daughters.  Retired.   2 caffeinated beverages daily, 3 glasses of wine daily   Never smoker no drug use no tobacco   Social Determinants of Health   Financial Resource Strain:   . Difficulty of Paying Living Expenses: Not on file  Food Insecurity:   . Worried About Programme researcher, broadcasting/film/video in the Last Year: Not on file  . Ran Out of Food in the Last Year: Not on file  Transportation Needs:   . Lack of Transportation (Medical): Not on file  . Lack of Transportation (Non-Medical): Not on file  Physical Activity:   . Days of Exercise per Week:  Not on file  . Minutes of Exercise per Session: Not on file  Stress:   . Feeling of Stress : Not on file  Social Connections:   . Frequency of Communication with Friends and Family: Not on  file  . Frequency of Social Gatherings with Friends and Family: Not on file  . Attends Religious Services: Not on file  . Active Member of Clubs or Organizations: Not on file  . Attends Banker Meetings: Not on file  . Marital Status: Not on file   Review of Systems - See HPI.  All other ROS are negative.  Wt 145 lb (65.8 kg)   BMI 24.13 kg/m   Physical Exam Vitals reviewed.  Constitutional:      Appearance: Normal appearance.  HENT:     Head: Normocephalic and atraumatic.  Cardiovascular:     Rate and Rhythm: Normal rate and regular rhythm.     Pulses: Normal pulses.     Heart sounds: Normal heart sounds.  Pulmonary:     Effort: Pulmonary effort is normal.     Breath sounds: Normal breath sounds.  Musculoskeletal:     Cervical back: Neck supple.  Skin:      Neurological:     General: No focal deficit present.     Mental Status: He is alert and oriented to person, place, and time.  Psychiatric:        Mood and Affect: Mood normal.     Recent Results (from the past 2160 hour(s))  Lipid panel     Status: None   Collection Time: 05/07/20 10:46 AM  Result Value Ref Range   Cholesterol 113 0 - 200 mg/dL    Comment: ATP III Classification       Desirable:  < 200 mg/dL               Borderline High:  200 - 239 mg/dL          High:  > = 008 mg/dL   Triglycerides 676.1 0 - 149 mg/dL    Comment: Normal:  <950 mg/dLBorderline High:  150 - 199 mg/dL   HDL 93.26 >71.24 mg/dL   VLDL 58.0 0.0 - 99.8 mg/dL   LDL Cholesterol 52 0 - 99 mg/dL   Total CHOL/HDL Ratio 3     Comment:                Men          Women1/2 Average Risk     3.4          3.3Average Risk          5.0          4.42X Average Risk          9.6          7.13X Average Risk          15.0          11.0                        NonHDL 72.47     Comment: NOTE:  Non-HDL goal should be 30 mg/dL higher than patient's LDL goal (i.e. LDL goal of < 70 mg/dL, would have non-HDL goal of < 100 mg/dL)  Basic metabolic panel     Status: None   Collection Time: 05/07/20 10:46 AM  Result Value Ref Range   Sodium 140 135 - 145 mEq/L   Potassium 3.9 3.5 - 5.1 mEq/L   Chloride 101 96 - 112 mEq/L   CO2 32 19 - 32 mEq/L   Glucose, Bld 91 70 - 99 mg/dL   BUN 22 6 - 23 mg/dL   Creatinine, Ser 3.38  0.40 - 1.50 mg/dL   GFR 16.1066.74 >96.04>60.00 mL/min   Calcium 9.2 8.4 - 10.5 mg/dL  TSH     Status: None   Collection Time: 05/07/20 10:46 AM  Result Value Ref Range   TSH 3.32 0.35 - 4.50 uIU/mL  Hepatic function panel     Status: None   Collection Time: 05/07/20 10:46 AM  Result Value Ref Range   Total Bilirubin 0.5 0.2 - 1.2 mg/dL   Bilirubin, Direct 0.1 0.0 - 0.3 mg/dL   Alkaline Phosphatase 82 39 - 117 U/L   AST 21 0 - 37 U/L   ALT 20 0 - 53 U/L   Total Protein 6.7 6.0 - 8.3 g/dL   Albumin 4.3 3.5 - 5.2 g/dL  CBC with Differential/Platelet     Status: None   Collection Time: 05/07/20 10:46 AM  Result Value Ref Range   WBC 6.0 4.0 - 10.5 K/uL   RBC 4.76 4.22 - 5.81 Mil/uL   Hemoglobin 14.4 13.0 - 17.0 g/dL   HCT 54.042.0 39 - 52 %   MCV 88.3 78.0 - 100.0 fl   MCHC 34.3 30.0 - 36.0 g/dL   RDW 98.113.6 19.111.5 - 47.815.5 %   Platelets 187.0 150 - 400 K/uL   Neutrophils Relative % 58.6 43 - 77 %   Lymphocytes Relative 27.4 12 - 46 %   Monocytes Relative 11.8 3 - 12 %   Eosinophils Relative 1.3 0 - 5 %   Basophils Relative 0.9 0 - 3 %   Neutro Abs 3.5 1.4 - 7.7 K/uL   Lymphs Abs 1.6 0.7 - 4.0 K/uL   Monocytes Absolute 0.7 0 - 1 K/uL   Eosinophils Absolute 0.1 0 - 0 K/uL   Basophils Absolute 0.1 0 - 0 K/uL   Assessment/Plan: 1. Tick bite, initial encounter Tick still embedded at time of visit. Present for < 1 day per patient. Sign of very localized cellulitis starting. Tick successfully removed after verbal consent for removal  was obtained from patient. Head successfully removed attached to body. Home care discussed. Will start Rx Doxycycline 100 mg BID x 7 days. Strict return precautions discussed with patient.   This visit occurred during the SARS-CoV-2 public health emergency.  Safety protocols were in place, including screening questions prior to the visit, additional usage of staff PPE, and extensive cleaning of exam room while observing appropriate contact time as indicated for disinfecting solutions.     Piedad ClimesWilliam Cody Lillybeth Tal, PA-C

## 2020-07-12 DIAGNOSIS — D1801 Hemangioma of skin and subcutaneous tissue: Secondary | ICD-10-CM | POA: Diagnosis not present

## 2020-07-12 DIAGNOSIS — L578 Other skin changes due to chronic exposure to nonionizing radiation: Secondary | ICD-10-CM | POA: Diagnosis not present

## 2020-07-12 DIAGNOSIS — L570 Actinic keratosis: Secondary | ICD-10-CM | POA: Diagnosis not present

## 2020-07-12 DIAGNOSIS — D692 Other nonthrombocytopenic purpura: Secondary | ICD-10-CM | POA: Diagnosis not present

## 2020-07-12 DIAGNOSIS — L821 Other seborrheic keratosis: Secondary | ICD-10-CM | POA: Diagnosis not present

## 2020-07-24 IMAGING — DX DG CHEST 1V PORT
1 series · 1 of 1 positions shown · non-contrast
Comparison: Radiographs March 10, 2018.

CLINICAL DATA: Endotracheal intubation.

EXAM:
PORTABLE CHEST 1 VIEW

[chest ap]
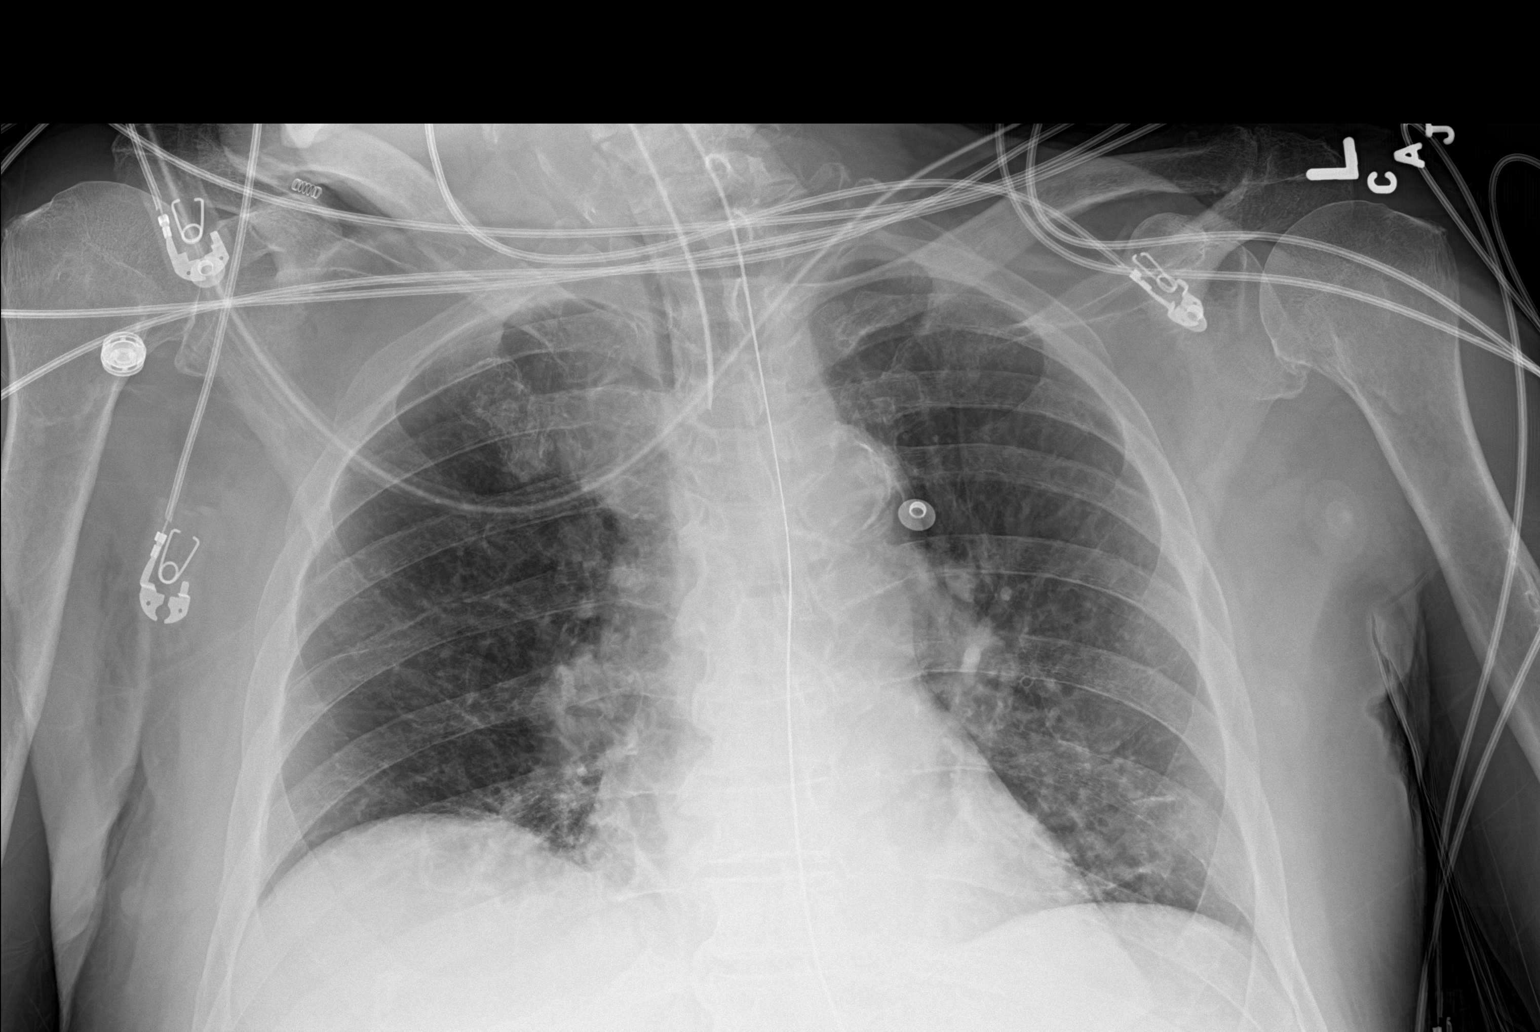

[1 of 1 positions shown; findings below may reference images not displayed]

FINDINGS: The heart size and mediastinal contours are within normal limits.
Endotracheal and nasogastric tubes are in grossly good position. No
pneumothorax or pleural effusion is noted. Minimal bibasilar
subsegmental atelectasis is noted. The visualized skeletal
structures are unremarkable.
IMPRESSION: Endotracheal and nasogastric tubes are in grossly good position.
Minimal bibasilar subsegmental atelectasis.

Aortic Atherosclerosis (68014-DE4.4).

## 2020-07-25 ENCOUNTER — Telehealth: Payer: Self-pay | Admitting: Pulmonary Disease

## 2020-07-25 MED ORDER — BREO ELLIPTA 100-25 MCG/INH IN AEPB: INHALATION_SPRAY | RESPIRATORY_TRACT | 3 refills | Status: DC

## 2020-07-25 NOTE — Telephone Encounter (Signed)
Rx for Breo has been sent to preferred pharmacy for pt. Called and spoke with pt letting him know this had been done and he verbalized understanding. Nothing further needed. 

## 2020-08-03 DIAGNOSIS — J343 Hypertrophy of nasal turbinates: Secondary | ICD-10-CM | POA: Diagnosis not present

## 2020-08-03 DIAGNOSIS — J320 Chronic maxillary sinusitis: Secondary | ICD-10-CM | POA: Diagnosis not present

## 2020-08-07 ENCOUNTER — Ambulatory Visit (INDEPENDENT_AMBULATORY_CARE_PROVIDER_SITE_OTHER): Payer: Medicare Other

## 2020-08-07 ENCOUNTER — Encounter: Payer: Self-pay | Admitting: Family Medicine

## 2020-08-07 ENCOUNTER — Ambulatory Visit (INDEPENDENT_AMBULATORY_CARE_PROVIDER_SITE_OTHER): Payer: Medicare Other | Admitting: Family Medicine

## 2020-08-07 ENCOUNTER — Other Ambulatory Visit: Payer: Self-pay

## 2020-08-07 VITALS — BP 126/62 | HR 76 | Ht 65.0 in | Wt 146.0 lb

## 2020-08-07 DIAGNOSIS — G5701 Lesion of sciatic nerve, right lower limb: Secondary | ICD-10-CM | POA: Diagnosis not present

## 2020-08-07 DIAGNOSIS — M549 Dorsalgia, unspecified: Secondary | ICD-10-CM | POA: Diagnosis not present

## 2020-08-07 DIAGNOSIS — M5136 Other intervertebral disc degeneration, lumbar region: Secondary | ICD-10-CM | POA: Diagnosis not present

## 2020-08-07 DIAGNOSIS — G8929 Other chronic pain: Secondary | ICD-10-CM | POA: Diagnosis not present

## 2020-08-07 DIAGNOSIS — M545 Low back pain, unspecified: Secondary | ICD-10-CM | POA: Diagnosis not present

## 2020-08-07 NOTE — Assessment & Plan Note (Addendum)
Patient is having more of a piriformis syndrome.  Seems to be worse with sitting.  Patient is to increase activity slowly.  We discussed different massage, we discussed manual massage likely beneficial as well.  Patient is to increase activity slowly.  Work with Event organiser.  Due to other comorbidities wanted to hold on a lot of medications.  Patient will follow up with me again 6 weeks.  Due to the severity of the spasm I would like to see patient again to to make sure that there is no slowly evolving mass.  Do not think so and patient denies any fevers chills or any abnormal weight loss

## 2020-08-07 NOTE — Patient Instructions (Signed)
Tennis back right pocket Arnica lotion for bruises Lumbar and pelvis xray Piriformis exercises Follow up in 6-8 weeks

## 2020-08-07 NOTE — Progress Notes (Signed)
[name] [address] [place] [address] Phone: [phone] Subjective:   [name], am serving as a scribe for Dr. Antoine Primas. This visit occurred during the SARS-CoV-2 public health emergency.  Safety protocols were in place, including screening questions prior to the visit, additional usage of staff PPE, and extensive cleaning of exam room while observing appropriate contact time as indicated for disinfecting solutions.   I'm seeing this patient by the request  of:  Sheliah Hatch, MD  CC: right hip pain   [id number]   04/20/2020 Patient is doing very well at this time.  Worsening symptoms we consider injections in the knees.  I believe patient will do well with conservative therapy.  Follow-up with me more on an as-needed basis.  Update 08/07/2020 [name] is a 85 y.o. male coming in with complaint of right hip pain in glute muscle with sit to stand. Pain is sharp for a few seconds. Pain subsides quickly. Denies any radiating symptoms.  Patient states it does not stop him from any activity.  Patient does states that he then drives no and stays seated for more than 2 hours starts having discomfort and pain as well.  States once he gets up and moves he seems to do much better.    Past Medical History:  Diagnosis Date  . Allergy   . Arthritis    "scattered" (10/26/2018)  . Asthma   . C. difficile diarrhea   . CAD (coronary artery disease) 2005   s/p PCI of RCA  . Carotid artery stenosis    1-39% bilateral by dopplers 10/2016  . Carotid stenosis    "? side; Dr. Edilia Bo"  . Cataract    NS OD  . Cognitive impairment, mild, so stated   . Colitis   . Erectile dysfunction   . GERD (gastroesophageal reflux disease)   . Hyperlipidemia   . Hypertension   . Perforated stomach, acute 10/23/2018  . Peripheral vascular disease (HCC)   . Retinal detachment   . Vertebrobasilar artery syndrome   . Vertigo    Past Surgical  History:  Procedure Laterality Date  . APPENDECTOMY  1954  . CATARACT EXTRACTION Left 08/08/2019   Dr. Hazle Quant  . COLONOSCOPY    . ESOPHAGOGASTRODUODENOSCOPY (EGD) WITH ESOPHAGEAL DILATION  09/2018  . EYE SURGERY    . LAPAROTOMY N/A 10/23/2018   Procedure: EXPLORATORY LAPAROTOMY WITH GRAHAM PATCH FOR PERFORATED ULCER AND PLACEMENT OF GASTRIC TUBE;  Surgeon: Emelia Loron, MD;  Location: [facility] OR;  Service: General;  Laterality: N/A;  . NASAL SINUS SURGERY  05/2018  . RETINAL DETACHMENT SURGERY     "? side"  . UPPER GASTROINTESTINAL ENDOSCOPY     Social History   Socioeconomic History  . Marital status: Married    Spouse name: Not on file  . Number of children: 3  . Years of education: Not on file  . Highest education level: Not on file  Occupational History  . Occupation: Retired Teaching laboratory technician: RETIRED  Tobacco Use  . Smoking status: Former Smoker    Years: 2.00    Types: Cigars    Quit date: 05/13/1961    Years since quitting: 59.2  . Smokeless tobacco: Never Used  . Tobacco comment: "never inhaled" in the early 60's   Vaping Use  . Vaping Use: Never used  Substance and Sexual Activity  . Alcohol use: Yes    Alcohol/week: 14.0 standard drinks  Types: 14 Glasses of wine per week  . Drug use: Never  . Sexual activity: Not Currently  Other Topics Concern  . Not on file  Social History Narrative   Married with 1 son and 2 daughters.  Retired.   2 caffeinated beverages daily, 3 glasses of wine daily   Never smoker no drug use no tobacco   Social Determinants of Health   Financial Resource Strain:   . Difficulty of Paying Living Expenses: Not on file  Food Insecurity:   . Worried About Programme researcher, broadcasting/film/videounning Out of Food in the Last Year: Not on file  . Ran Out of Food in the Last Year: Not on file  Transportation Needs:   . Lack of Transportation (Medical): Not on file  . Lack of Transportation (Non-Medical): Not on file  Physical Activity:   . Days of  Exercise per Week: Not on file  . Minutes of Exercise per Session: Not on file  Stress:   . Feeling of Stress : Not on file  Social Connections:   . Frequency of Communication with Friends and Family: Not on file  . Frequency of Social Gatherings with Friends and Family: Not on file  . Attends Religious Services: Not on file  . Active Member of Clubs or Organizations: Not on file  . Attends BankerClub or Organization Meetings: Not on file  . Marital Status: Not on file   Allergies  Allergen Reactions  . Neosporin [Bacitracin-Polymyxin B] Other (See Comments)    Redness   Family History  Problem Relation Age of Onset  . Stroke Mother   . Hyperlipidemia Mother   . Hypertension Mother   . Deep vein thrombosis Mother   . Stroke Father   . Hyperlipidemia Father   . Hypertension Father   . Deep vein thrombosis Father   . Diverticulitis Father   . Ulcerative colitis Father   . Colon polyps Father   . Colon cancer Neg Hx   . Esophageal cancer Neg Hx   . Stomach cancer Neg Hx   . Rectal cancer Neg Hx      Current Outpatient Medications (Cardiovascular):  .  amLODipine (NORVASC) 5 MG tablet, Take 1 tablet (5 mg total) by mouth daily. Marland Kitchen.  atorvastatin (LIPITOR) 80 MG tablet, TAKE 1 TABLET BY MOUTH DAILY .  carvedilol (COREG) 6.25 MG tablet, Take 1 tablet (6.25 mg total) by mouth 2 (two) times daily with a meal. .  hydrochlorothiazide (HYDRODIURIL) 25 MG tablet, TAKE 1 TABLET BY MOUTH DAILY  Current Outpatient Medications (Respiratory):  .  albuterol (PROVENTIL) (2.5 MG/3ML) 0.083% nebulizer solution, Take 3 mLs (2.5 mg total) by nebulization every 6 (six) hours as needed for wheezing or shortness of breath. Marland Kitchen.  albuterol (VENTOLIN HFA) 108 (90 Base) MCG/ACT inhaler, Inhale 2 puffs into the lungs every 6 (six) hours as needed for wheezing or shortness of breath. Marland Kitchen.  azelastine (ASTELIN) 0.1 % nasal spray, Place 1 spray into both nostrils 2 (two) times daily. .  fluticasone (FLONASE) 50  MCG/ACT nasal spray, 1 spray each nostril twice daily .  fluticasone furoate-vilanterol (BREO ELLIPTA) 100-25 MCG/INH AEPB, INHALE 1 PUFF INTO THE LUNGS DAILY .  levocetirizine (XYZAL) 5 MG tablet, Take 1 tablet (5 mg total) by mouth every evening. .  montelukast (SINGULAIR) 10 MG tablet, Take 1 tablet (10 mg total) by mouth at bedtime.  Current Outpatient Medications (Analgesics):  .  acetaminophen (TYLENOL) 500 MG tablet, Take 500 mg by mouth 2 (two) times daily. .Marland Kitchen  aspirin EC 81 MG tablet, Take 81 mg by mouth daily.   Current Outpatient Medications (Other):  .  cholecalciferol (VITAMIN D3) 25 MCG (1000 UT) tablet, Take 1,000 Units by mouth daily. .  Diclofenac Sodium 1.5 % SOLN, Fingertip size amount over most painful spot 2 times daily .  donepezil (ARICEPT) 10 MG tablet, Take 1 tablet (10 mg total) by mouth daily. Marland Kitchen  doxycycline (VIBRA-TABS) 100 MG tablet, Take 1 tablet (100 mg total) by mouth 2 (two) times daily. .  Eyelid Cleansers (OCUSOFT EYELID CLEANSING) PADS, Place 1 application into both eyes 2 (two) times daily. .  fish oil-omega-3 fatty acids 1000 MG capsule, Take 2 g by mouth 2 (two) times daily.  Marland Kitchen  gabapentin (NEURONTIN) 100 MG capsule, Take 2 capsules (200 mg total) by mouth at bedtime. .  gabapentin (NEURONTIN) 300 MG capsule, Take 1 capsule (300 mg total) by mouth 3 (three) times daily. Marland Kitchen  LOTEMAX SM 0.38 % GEL,  .  ofloxacin (OCUFLOX) 0.3 % ophthalmic solution, INSTILL 1 DROP IN LEFT EYE FOUR TIMES DAILY FOR 7 DAYS BEGINNING AFTER SURGERY .  Polyvinyl Alcohol-Povidone (REFRESH OP), Place 1 drop into both eyes 2 (two) times daily. .  prednisoLONE acetate (PRED FORTE) 1 % ophthalmic suspension, Place 1 drop into the left eye every hour while awake. Marland Kitchen  Respiratory Therapy Supplies (FLUTTER) DEVI, 1 Device by Does not apply route daily. .  Saw Palmetto 500 MG CAPS, Take 1,000 mg by mouth 2 (two) times daily.  Marland Kitchen  Spacer/Aero-Holding Rudean Curt, Use as  directed.   Reviewed prior external information including notes and imaging from  primary care provider As well as notes that were available from care everywhere and other healthcare systems.  Past medical history, social, surgical and family history all reviewed in electronic medical record.  No pertanent information unless stated regarding to the chief complaint.   Review of Systems:  No headache, visual changes, nausea, vomiting, diarrhea, constipation, dizziness, abdominal pain, skin rash, fevers, chills, night sweats, weight loss, swollen lymph nodes, body aches, joint swelling, chest pain, shortness of breath, mood changes. POSITIVE muscle aches  Objective  Blood pressure 126/62, pulse 76, height 5\' 5"  (1.651 m), weight 146 lb (66.2 kg), SpO2 98 %.   General: No apparent distress alert and oriented x3 mood and affect normal, dressed appropriately.  Does have some bruising of the left arm HEENT: Pupils equal, extraocular movements intact  Respiratory: Patient's speak in full sentences and does not appear short of breath  Cardiovascular: No lower extremity edema, non tender, no erythema  Gait normal with good balance and coordination.  Mild external rotation of the lower extremities MSK: Arthritic changes of multiple joints Patient's right hip has good range of motion in all planes.  Tender to palpation over the piriformis as well as the gluteal tendon.  Minimal pain over the greater enteric area.  Mild positive .  No masses appreciated but does seem to be in somewhat I am muscle spasm.  [id number]; 15 additional minutes spent for Therapeutic exercises as stated in above notes.  This included exercises focusing on stretching, strengthening, with significant focus on eccentric aspects.   Long term goals include an improvement in range of motion, strength, endurance as well as avoiding reinjury. Patient's frequency would include in 1-2 times a day, 3-5 times a week for a duration of 6-12  weeks. Using Netter's Orthopaedic Anatomy, reviewed with the patient the structures involved and how they related to diagnosis. The patient  indicated understanding.   The patient was given a handout about classic piriformis stretching including [employer], Modified [employer], my self-described "Sink Stretch," and other piriformis rehab.  We also reviewed hip flexor and abductor strengthening, ham stretching  Rec deep massage, explained self-massage with ball  Proper technique shown and discussed handout in great detail with ATC.  All questions were discussed and answered.     Impression and Recommendations:     The above documentation has been reviewed and is accurate and complete Judi Saa, DO

## 2020-08-23 ENCOUNTER — Other Ambulatory Visit: Payer: Self-pay

## 2020-08-23 MED ORDER — CARVEDILOL 6.25 MG PO TABS: 6.2500 mg | ORAL_TABLET | Freq: Two times a day (BID) | ORAL | 0 refills | Status: DC

## 2020-09-10 ENCOUNTER — Other Ambulatory Visit: Payer: Self-pay | Admitting: *Deleted

## 2020-09-10 DIAGNOSIS — I6523 Occlusion and stenosis of bilateral carotid arteries: Secondary | ICD-10-CM

## 2020-09-10 DIAGNOSIS — E7800 Pure hypercholesterolemia, unspecified: Secondary | ICD-10-CM

## 2020-09-10 DIAGNOSIS — I10 Essential (primary) hypertension: Secondary | ICD-10-CM

## 2020-09-10 DIAGNOSIS — I2510 Atherosclerotic heart disease of native coronary artery without angina pectoris: Secondary | ICD-10-CM

## 2020-09-10 MED ORDER — AMLODIPINE BESYLATE 5 MG PO TABS: 5.0000 mg | ORAL_TABLET | Freq: Every day | ORAL | 0 refills | Status: DC

## 2020-09-24 ENCOUNTER — Encounter: Payer: Self-pay | Admitting: Family Medicine

## 2020-09-24 ENCOUNTER — Ambulatory Visit: Payer: Medicare Other | Admitting: Family Medicine

## 2020-09-24 ENCOUNTER — Telehealth: Payer: Self-pay | Admitting: Primary Care

## 2020-09-24 ENCOUNTER — Other Ambulatory Visit: Payer: Self-pay

## 2020-09-24 VITALS — BP 138/62 | Ht 65.0 in | Wt 146.0 lb

## 2020-09-24 DIAGNOSIS — M25551 Pain in right hip: Secondary | ICD-10-CM

## 2020-09-24 DIAGNOSIS — M545 Low back pain, unspecified: Secondary | ICD-10-CM

## 2020-09-24 DIAGNOSIS — G5701 Lesion of sciatic nerve, right lower limb: Secondary | ICD-10-CM

## 2020-09-24 DIAGNOSIS — G8929 Other chronic pain: Secondary | ICD-10-CM

## 2020-09-24 MED ORDER — PREDNISONE 10 MG PO TABS: ORAL_TABLET | ORAL | 0 refills | Status: DC

## 2020-09-24 MED ORDER — DOXYCYCLINE HYCLATE 100 MG PO TABS: 100.0000 mg | ORAL_TABLET | Freq: Two times a day (BID) | ORAL | 0 refills | Status: DC

## 2020-09-24 NOTE — Telephone Encounter (Signed)
I would recommend covid testing if he was around anyone recently for the holidays. I will send in course of Doxycycline and prednisone taper. He should take mucinex 600mg  twice daily. If we have Breo sample we can give him one. Continue to use albuterol 2 puffs every 4-6 hours for breakthrough shortness of breath or wheezing. Take tylenol 650mg  every 6 hours for fever/chills. If he develops acute or worsening shortness of breath needs to present to ED.

## 2020-09-24 NOTE — Assessment & Plan Note (Signed)
Patient continues to have tightness at this time.  Discussed with patient that we could consider secondary to the ossification centers the possibility of MRI.  Patient declined and would like to start with physical therapy.  I think this is reasonable with patient having no fevers, chills, any abnormal weight loss and no nighttime pain.  Patient does have known spinal stenosis that could be contributing.  Patient is already on a significant amount of different medications and does not want to add any other ones at the moment.  Encouraged him to continue the home exercises.  Follow-up again in 5 to 6 weeks and at that time we will either consider the possible injection in the area where patient is having the most pain in the piriformis in the gluteal area or MRI of the pelvis.  Patient is in agreement with the plan.  Total time reviewing patient's imaging and talking to patient today 33 minutes

## 2020-09-24 NOTE — Patient Instructions (Signed)
Percussion gun PT [facility] after activity  See me again in 6 weeks If not better we will talk about injection or MRI

## 2020-09-24 NOTE — Telephone Encounter (Signed)
Primary Pulmonologist: [facility] Last office visit and with whom: 04/06/20 with Beth What do we see them for (pulmonary problems): COPD Last OV assessment/plan:  Assessment & Plan Note by Glenford Bayley, NP at 04/09/2020 8:33 AM Author: Glenford Bayley, NP Author Type: Nurse Practitioner Filed: 04/09/2020 8:35 AM  Note Status: [place] Cater: Cosign Not Required Encounter Date: 04/06/2020  Problem: Rhinitis, chronic  Editor: Glenford Bayley, NP (Nurse Practitioner)      Prior Versions: 1. Glenford Bayley, NP (Nurse Practitioner) at 04/09/2020 8:34 AM - Written      - Recommend patient use saline nasal spray twice daily  - Continue Astelin nasal spray and Flonase as prescribed  - Continue Zyrtec 10mg  once daily and Singulair 1 tablet at bedtime     Assessment & Plan Note by , NP at 04/09/2020 8:33 AM Author: 04/11/2020, NP Author Type: Nurse Practitioner Filed: 04/09/2020 8:33 AM  Note Status: Written Cosign: Cosign Not Required Encounter Date: 04/06/2020  Problem: Nasal polyps  Editor: 06/06/2020, NP (Nurse Practitioner)                 - Refer back to ENT, advised patient to contacted [facility] to schedule visit     Assessment & Plan Note by [facility], NP at 04/09/2020 8:32 AM Author: 04/11/2020, NP Author Type: Nurse Practitioner Filed: 04/09/2020 8:33 AM  Note Status: Written Cosign: Cosign Not Required Encounter Date: 04/06/2020  Problem: Restrictive lung disease  Editor: 06/06/2020, NP (Nurse Practitioner)                 - Stable, rare SABA use  - Continue Breo Ellipta 100 one puff in the morning (rinse mouth after use)      Patient Instructions by Glenford Bayley, NP at 04/06/2020 2:00 PM Author: 06/06/2020, NP Author Type: Nurse Practitioner Filed: 04/06/2020 2:25 PM  Note Status: Addendum Cosign: Cosign Not Required Encounter Date: 04/06/2020  Editor: 06/06/2020, NP (Nurse Practitioner)       Prior Versions: 1. Glenford Bayley, NP (Nurse Practitioner) at 04/06/2020 2:23 PM - Addendum   2. 06/06/2020, NP (Nurse Practitioner) at 04/06/2020 2:14 PM - Addendum   3. 06/06/2020, NP (Nurse Practitioner) at 04/06/2020 2:13 PM - Addendum   4. 06/06/2020, NP (Nurse Practitioner) at 04/06/2020 2:10 PM - Signed       Asthma: - Continue Breo one puff in the morning (rinse mouth after use)  Allergic rhinitis: - Continue Zyrtec 10mg  once daily - Continue Singulair 1 tablet at bedtime  - Use saline nasal spray first thing in the morning and then again at night followed by Astelin nasal spray twice day. Then use flonase nasal spray at night.   Orders: - CXR re: cough  - Sputum sample   Follow-up: - Recommend you follow up with Dr. 06/06/2020 with [facility] regarding chronic sinusitis and right nasal polp - 1 YEAR with Dr. Jearld Fenton or [facility] NP       Was appointment offered to patient (explain)?  Pt wants recommendations   Reason for call: called and spoke with pt who stated he has had complaints of cough with yellow phlegm and chest congestion which began 4 days ago. Pt also has had complaints of wheezing and fevers as high as 101 but today temp was 100.9.  Pt said that he has been taking tylenol to help with temps, last  time tylenol was taken was this morning around 8am 11/29. Had pt check temp while on phone with me and his temp as 98.9. pt denies any complaints of loss of taste or smell, no headache.  Pt has had to use the albuterol inhaler 4 times yesterday 11/28 and so far has only used it once today 11/29. Pt has not used the nebulizer. Pt stated he currently is not using his breo inhaler as last time it was used was about 1 month ago due to not able to afford it at this time due to being in the donut hole.   Pt is taking all other meds as prescribed.  Pt has received all three covid vaccines. Pt received booster vaccine on 07/27/20.  Pt wants to know  what could be recommended to help with symptoms. Beth, please advise. Allergies  Allergen Reactions  . Neosporin [Bacitracin-Polymyxin B] Other (See Comments)    Redness    Immunization History  Administered Date(s) Administered  . Fluad Quad(high Dose 65+) 06/24/2019  . Influenza Split 08/25/2011, 08/09/2012  . Influenza Whole 07/11/2010  . Influenza, High Dose Seasonal PF 08/14/2014, 07/16/2018  . Influenza,inj,Quad PF,6+ Mos 08/11/2013, 08/18/2014, 07/11/2015, 07/14/2016, 07/20/2017  . PFIZER SARS-COV-2 Vaccination 11/15/2019, 12/06/2019  . Pneumococcal Conjugate-13 01/25/2015  . Pneumococcal Polysaccharide-23 09/15/2002  . Td 12/13/2007, 05/31/2019  . Zoster 10/29/2010

## 2020-09-24 NOTE — Telephone Encounter (Signed)
Lm for patient.  

## 2020-09-24 NOTE — Telephone Encounter (Signed)
Patient is returning phone call. Patient phone number is [phone] c or [phone] h.

## 2020-09-24 NOTE — Progress Notes (Signed)
[name] [address] [place] [address] Phone: [phone] Subjective:   [name], am serving as a scribe for Dr. Antoine Primas. This visit occurred during the SARS-CoV-2 public health emergency.  Safety protocols were in place, including screening questions prior to the visit, additional usage of staff PPE, and extensive cleaning of exam room while observing appropriate contact time as indicated for disinfecting solutions.   I'm seeing this patient by the request  of:  Sheliah Hatch, MD  CC: Low right buttocks pain  TMH:[id number]   08/07/2020 Patient is having more of a piriformis syndrome.  Seems to be worse with sitting.  Patient is to increase activity slowly.  We discussed different massage, we discussed manual massage likely beneficial as well.  Patient is to increase activity slowly.  Work with Event organiser.  Due to other comorbidities wanted to hold on a lot of medications.  Patient will follow up with me again 6 weeks.  Due to the severity of the spasm I would like to see patient again to to make sure that there is no slowly evolving mass.  Do not think so and patient denies any fevers chills or any abnormal weight loss   Update 09/24/2020 Jajuan Skoog is a 85 y.o. male coming in with complaint of low back and right piriformis syndrome. Pain still occurring daily. Exercises did not help his pain.  Patient states that the pain is only when he goes from a sitting to standing position.  Seems to go away after 3 or 4 steps but is severely tender when it happens.  Denies the radiation of the pain down the leg as much as he was having previously.   Patient did have x-rays at last exam.  X-rays were independently visualized by me showing the patient does have some ossification changes of the soft tissue around the hips    Past Medical History:  Diagnosis Date  . Allergy   . Arthritis    "scattered" (10/26/2018)  . Asthma   .  C. difficile diarrhea   . CAD (coronary artery disease) 2005   s/p PCI of RCA  . Carotid artery stenosis    1-39% bilateral by dopplers 10/2016  . Carotid stenosis    "? side; Dr. Edilia Bo"  . Cataract    NS OD  . Cognitive impairment, mild, so stated   . Colitis   . Erectile dysfunction   . GERD (gastroesophageal reflux disease)   . Hyperlipidemia   . Hypertension   . Perforated stomach, acute 10/23/2018  . Peripheral vascular disease (HCC)   . Retinal detachment   . Vertebrobasilar artery syndrome   . Vertigo    Past Surgical History:  Procedure Laterality Date  . APPENDECTOMY  1954  . CATARACT EXTRACTION Left 08/08/2019   Dr. Hazle Quant  . COLONOSCOPY    . ESOPHAGOGASTRODUODENOSCOPY (EGD) WITH ESOPHAGEAL DILATION  09/2018  . EYE SURGERY    . LAPAROTOMY N/A 10/23/2018   Procedure: EXPLORATORY LAPAROTOMY WITH GRAHAM PATCH FOR PERFORATED ULCER AND PLACEMENT OF GASTRIC TUBE;  Surgeon: Emelia Loron, MD;  Location: [facility] OR;  Service: General;  Laterality: N/A;  . NASAL SINUS SURGERY  05/2018  . RETINAL DETACHMENT SURGERY     "? side"  . UPPER GASTROINTESTINAL ENDOSCOPY     Social History   Socioeconomic History  . Marital status: Married    Spouse name: Not on file  . Number of children: 3  . Years of education:  Not on file  . Highest education level: Not on file  Occupational History  . Occupation: Retired Teaching laboratory technician: RETIRED  Tobacco Use  . Smoking status: Former Smoker    Years: 2.00    Types: Cigars    Quit date: 05/13/1961    Years since quitting: 59.4  . Smokeless tobacco: Never Used  . Tobacco comment: "never inhaled" in the early 60's   Vaping Use  . Vaping Use: Never used  Substance and Sexual Activity  . Alcohol use: Yes    Alcohol/week: 14.0 standard drinks    Types: 14 Glasses of wine per week  . Drug use: Never  . Sexual activity: Not Currently  Other Topics Concern  . Not on file  Social History Narrative   Married with 1  son and 2 daughters.  Retired.   2 caffeinated beverages daily, 3 glasses of wine daily   Never smoker no drug use no tobacco   Social Determinants of Health   Financial Resource Strain:   . Difficulty of Paying Living Expenses: Not on file  Food Insecurity:   . Worried About Programme researcher, broadcasting/film/video in the Last Year: Not on file  . Ran Out of Food in the Last Year: Not on file  Transportation Needs:   . Lack of Transportation (Medical): Not on file  . Lack of Transportation (Non-Medical): Not on file  Physical Activity:   . Days of Exercise per Week: Not on file  . Minutes of Exercise per Session: Not on file  Stress:   . Feeling of Stress : Not on file  Social Connections:   . Frequency of Communication with Friends and Family: Not on file  . Frequency of Social Gatherings with Friends and Family: Not on file  . Attends Religious Services: Not on file  . Active Member of Clubs or Organizations: Not on file  . Attends Banker Meetings: Not on file  . Marital Status: Not on file   Allergies  Allergen Reactions  . Neosporin [Bacitracin-Polymyxin B] Other (See Comments)    Redness   Family History  Problem Relation Age of Onset  . Stroke Mother   . Hyperlipidemia Mother   . Hypertension Mother   . Deep vein thrombosis Mother   . Stroke Father   . Hyperlipidemia Father   . Hypertension Father   . Deep vein thrombosis Father   . Diverticulitis Father   . Ulcerative colitis Father   . Colon polyps Father   . Colon cancer Neg Hx   . Esophageal cancer Neg Hx   . Stomach cancer Neg Hx   . Rectal cancer Neg Hx      Current Outpatient Medications (Cardiovascular):  .  amLODipine (NORVASC) 5 MG tablet, Take 1 tablet (5 mg total) by mouth daily. PT IS OVERDUE FOR AN APPOINTMENT... CAN AUTHORIZE 30 DAY SUPPLY .Marland Kitchen PLEASE HAVE PT CALL TO SCHEDULE .  atorvastatin (LIPITOR) 80 MG tablet, TAKE 1 TABLET BY MOUTH DAILY .  carvedilol (COREG) 6.25 MG tablet, Take 1 tablet  (6.25 mg total) by mouth 2 (two) times daily with a meal. Please make yearly appt with Dr. Mayford Knife for November for future refills. Thank you 1st attempt .  hydrochlorothiazide (HYDRODIURIL) 25 MG tablet, TAKE 1 TABLET BY MOUTH DAILY  Current Outpatient Medications (Respiratory):  .  albuterol (PROVENTIL) (2.5 MG/3ML) 0.083% nebulizer solution, Take 3 mLs (2.5 mg total) by nebulization every 6 (six) hours as needed for wheezing  or shortness of breath. Marland Kitchen.  albuterol (VENTOLIN HFA) 108 (90 Base) MCG/ACT inhaler, Inhale 2 puffs into the lungs every 6 (six) hours as needed for wheezing or shortness of breath. Marland Kitchen.  azelastine (ASTELIN) 0.1 % nasal spray, Place 1 spray into both nostrils 2 (two) times daily. .  fluticasone (FLONASE) 50 MCG/ACT nasal spray, 1 spray each nostril twice daily .  fluticasone furoate-vilanterol (BREO ELLIPTA) 100-25 MCG/INH AEPB, INHALE 1 PUFF INTO THE LUNGS DAILY .  levocetirizine (XYZAL) 5 MG tablet, Take 1 tablet (5 mg total) by mouth every evening. .  montelukast (SINGULAIR) 10 MG tablet, Take 1 tablet (10 mg total) by mouth at bedtime.  Current Outpatient Medications (Analgesics):  .  acetaminophen (TYLENOL) 500 MG tablet, Take 500 mg by mouth 2 (two) times daily. Marland Kitchen.  aspirin EC 81 MG tablet, Take 81 mg by mouth daily.   Current Outpatient Medications (Other):  .  cholecalciferol (VITAMIN D3) 25 MCG (1000 UT) tablet, Take 1,000 Units by mouth daily. .  Diclofenac Sodium 1.5 % SOLN, Fingertip size amount over most painful spot 2 times daily .  donepezil (ARICEPT) 10 MG tablet, Take 1 tablet (10 mg total) by mouth daily. Marland Kitchen.  doxycycline (VIBRA-TABS) 100 MG tablet, Take 1 tablet (100 mg total) by mouth 2 (two) times daily. .  Eyelid Cleansers (OCUSOFT EYELID CLEANSING) PADS, Place 1 application into both eyes 2 (two) times daily. .  fish oil-omega-3 fatty acids 1000 MG capsule, Take 2 g by mouth 2 (two) times daily.  Marland Kitchen.  gabapentin (NEURONTIN) 100 MG capsule, Take 2 capsules  (200 mg total) by mouth at bedtime. .  gabapentin (NEURONTIN) 300 MG capsule, Take 1 capsule (300 mg total) by mouth 3 (three) times daily. Marland Kitchen.  LOTEMAX SM 0.38 % GEL,  .  ofloxacin (OCUFLOX) 0.3 % ophthalmic solution, INSTILL 1 DROP IN LEFT EYE FOUR TIMES DAILY FOR 7 DAYS BEGINNING AFTER SURGERY .  Polyvinyl Alcohol-Povidone (REFRESH OP), Place 1 drop into both eyes 2 (two) times daily. .  prednisoLONE acetate (PRED FORTE) 1 % ophthalmic suspension, Place 1 drop into the left eye every hour while awake. Marland Kitchen.  Respiratory Therapy Supplies (FLUTTER) DEVI, 1 Device by Does not apply route daily. .  Saw Palmetto 500 MG CAPS, Take 1,000 mg by mouth 2 (two) times daily.  Marland Kitchen.  Spacer/Aero-Holding Rudean Curthambers DEVI, Use as directed.   Reviewed prior external information including notes and imaging from  primary care provider As well as notes that were available from care everywhere and other healthcare systems.  Past medical history, social, surgical and family history all reviewed in electronic medical record.  No pertanent information unless stated regarding to the chief complaint.   Review of Systems:  No headache, visual changes, nausea, vomiting, diarrhea, constipation, dizziness, abdominal pain, skin rash, fevers, chills, night sweats, weight loss, swollen lymph nodes, body aches, joint swelling, chest pain, shortness of breath, mood changes. POSITIVE muscle aches  Objective  Blood pressure 138/62, height 5\' 5"  (1.651 m), weight 146 lb (66.2 kg).   General: No apparent distress alert and oriented x3 mood and affect normal, dressed appropriately.  HEENT: Pupils equal, extraocular movements intact  Respiratory: Patient's speak in full sentences and does not appear short of breath  Cardiovascular: No lower extremity edema, non tender, no erythema  MSK: Patient's low back exam does have loss of lordosis.  Patient does have mild tightness with Pearlean BrownieFaber but very minimally on the right side.  Patient does  notice pain when going  from a seated to standing position.  Points more over the proximal medial aspect of the gluteal and piriformis.  Does have more of a hardness in this area but no true mass appreciated.  Patient does have some mild atrophy noted of the hamstrings bilaterally.  Loss of lordosis of the lumbar spine is fairly significant.    Impression and Recommendations:     The above documentation has been reviewed and is accurate and complete Judi Saa, DO

## 2020-09-24 NOTE — Telephone Encounter (Signed)
Lm x2 for patient on both mobile and home number on file.

## 2020-09-24 NOTE — Telephone Encounter (Signed)
I have called and LM on the VM for the pt to call our office back.

## 2020-09-25 ENCOUNTER — Other Ambulatory Visit: Payer: Self-pay

## 2020-09-25 ENCOUNTER — Encounter: Payer: Self-pay | Admitting: Physical Therapy

## 2020-09-25 ENCOUNTER — Telehealth: Payer: Self-pay | Admitting: Primary Care

## 2020-09-25 ENCOUNTER — Ambulatory Visit: Payer: Medicare Other | Attending: Family Medicine | Admitting: Physical Therapy

## 2020-09-25 DIAGNOSIS — R252 Cramp and spasm: Secondary | ICD-10-CM | POA: Diagnosis not present

## 2020-09-25 DIAGNOSIS — M6281 Muscle weakness (generalized): Secondary | ICD-10-CM | POA: Diagnosis not present

## 2020-09-25 DIAGNOSIS — G8929 Other chronic pain: Secondary | ICD-10-CM

## 2020-09-25 DIAGNOSIS — M545 Low back pain, unspecified: Secondary | ICD-10-CM | POA: Diagnosis not present

## 2020-09-25 MED ORDER — BREO ELLIPTA 100-25 MCG/INH IN AEPB: 1.0000 | INHALATION_SPRAY | Freq: Every day | RESPIRATORY_TRACT | 0 refills | Status: DC

## 2020-09-25 NOTE — Telephone Encounter (Signed)
Patient is returning phone call. Patient phone number is [phone] h or [phone] c.

## 2020-09-25 NOTE — Patient Instructions (Signed)
Access Code: [email or web address] URL: [email or web address] Date: 09/25/2020 Prepared by: Lysle Rubens  Exercises Supine Lower Trunk Rotation - 1 x daily - 7 x weekly - 3 sets - 10 reps - 5 sec hold Supine Posterior Pelvic Tilt - 1 x daily - 7 x weekly - 3 sets - 10 reps - 3 sec hold Supine Piriformis Stretch with Leg Straight - 1 x daily - 7 x weekly - 3 sets - 2 reps - 15-20 sec hold Seated Piriformis Stretch - 1 x daily - 7 x weekly - 3 sets - 2 reps - 15-20 sec hold Seated Piriformis Stretch - 1 x daily - 7 x weekly - 3 sets - 2 reps - 15-20 sec hold

## 2020-09-25 NOTE — Telephone Encounter (Signed)
Patient went to get a covid test last night and the [facility] was closed, so they went to the Urgent care and they were turned away as they do not take their insurance.   [facility] and I gave them the address to the [employer] testing location .  Went over instructions for medication Beth sent in. They would like a sample of breo 100 so I have one for them to pick up at the front desk.   Patient still running fevers at and during the night time but in the morning is back to a normal temperature

## 2020-09-25 NOTE — Telephone Encounter (Signed)
lmtcb for pt.  

## 2020-09-25 NOTE — Therapy (Addendum)
[facility] [place], [place], [address] Phone: [phone]   Fax:  [phone]  Physical Therapy Evaluation  Patient Details  Name: [name] MRN: [id number] Date of Birth: 1935-09-22 Referring Provider (PT): Esmeralda Arthur Date: 09/25/2020   PT End of Session - 09/25/20 1518    Visit Number 1    Date for PT Re-Evaluation 11/25/20    PT Start Time 0165    PT Stop Time 1518    PT Time Calculation (min) 41 min    Activity Tolerance Patient tolerated treatment well    Behavior During Therapy [facility] for tasks assessed/performed           Past Medical History:  Diagnosis Date  . Allergy   . Arthritis    "scattered" (10/26/2018)  . Asthma   . C. difficile diarrhea   . CAD (coronary artery disease) 2005   s/p PCI of RCA  . Carotid artery stenosis    1-39% bilateral by dopplers 10/2016  . Carotid stenosis    "? side; Dr. Scot Dock"  . Cataract    NS OD  . Cognitive impairment, mild, so stated   . Colitis   . Erectile dysfunction   . GERD (gastroesophageal reflux disease)   . Hyperlipidemia   . Hypertension   . Perforated stomach, acute 10/23/2018  . Peripheral vascular disease ([place])   . Retinal detachment   . Vertebrobasilar artery syndrome   . Vertigo     Past Surgical History:  Procedure Laterality Date  . APPENDECTOMY  1954  . CATARACT EXTRACTION Left 08/08/2019   Dr. Bing Plume  . COLONOSCOPY    . ESOPHAGOGASTRODUODENOSCOPY (EGD) WITH ESOPHAGEAL DILATION  09/2018  . EYE SURGERY    . LAPAROTOMY N/A 10/23/2018   Procedure: EXPLORATORY LAPAROTOMY WITH GRAHAM PATCH FOR PERFORATED ULCER AND PLACEMENT OF GASTRIC TUBE;  Surgeon: Rolm Bookbinder, MD;  Location: [facility];  Service: General;  Laterality: N/A;  . NASAL SINUS SURGERY  05/2018  . RETINAL DETACHMENT SURGERY     "? side"  . UPPER GASTROINTESTINAL ENDOSCOPY      There were no vitals filed for this visit.    Subjective Assessment -  09/25/20 1439    Subjective Pt reports that he has been having pain in R buttocks for ~6 months, no known MOI. Pt denies N/T in RLE and denies radiating pain. Pt states that MD gave piriformis stretches and that helped some but did not completely fix the issue.    Limitations Sitting;Standing    Diagnostic tests xrays lumbar spine    Patient Stated Goals get rid of the pain    Currently in Pain? Yes    Pain Score 8     Pain Location Buttocks    Pain Orientation Right    Pain Descriptors / Indicators Sharp    Pain Type Chronic pain    Pain Onset More than a month ago    Pain Frequency Intermittent    Aggravating Factors  sitting, rising from sitting    Pain Relieving Factors standing              [facility] PT Assessment - 09/25/20 0001      Assessment   Medical Diagnosis LBP    Referring Provider (PT) Tamala Julian    Prior Therapy none      Precautions   Precautions None      Restrictions   Weight Bearing Restrictions No      Balance Screen  Has the patient fallen in the past 6 months Yes    How many times? 2   missed step ~6 wks ago, getting out of car ~4 mos ago   Has the patient had a decrease in activity level because of a fear of falling?  No    Is the patient reluctant to leave their home because of a fear of falling?  No      Home Environment   Additional Comments stairs inside the house; reports no trouble with stairs (16 stairs with HR)      Prior Function   Level of Independence Independent    Vocation Retired    Leisure taking care of animals, some yardwork/housework      Functional Tests   Functional tests Sit to Stand      Sit to Stand   Comments leaning to LLE      Posture/Postural Control   Posture/Postural Control Postural limitations    Postural Limitations Rounded Shoulders;Forward head;Increased lumbar lordosis;Anterior pelvic tilt      ROM / Strength   AROM / PROM / Strength AROM;Strength      AROM   Overall AROM Comments lumbar AROM limited 50%       Strength   Overall Strength Comments BLE 4/5      Flexibility   Soft Tissue Assessment /Muscle Length yes    Piriformis very tight      Palpation   Spinal mobility hypomobility of lumbar spine    Palpation comment tender to palpation R piriformis/glute med      Special Tests   Other special tests SLR equivalent B                      Objective measurements completed on examination: See above findings.       [place] Adult PT Treatment/Exercise - 09/25/20 0001      Exercises   Exercises Lumbar      Lumbar Exercises: Stretches   Lower Trunk Rotation 5 reps;10 seconds    Pelvic Tilt 10 reps;5 seconds    Piriformis Stretch Right;Left;2 reps;20 seconds                  PT Education - 09/25/20 1518    Education Details Pt educated on POC and HEP    Person(s) Educated Patient    Methods Explanation;Demonstration;Handout    Comprehension Verbalized understanding;Returned demonstration            PT Short Term Goals - 09/25/20 1523      PT SHORT TERM GOAL #1   Title Pt will be I with initial HEP    Time 2    Period Weeks    Status New    Target Date 10/09/20             PT Long Term Goals - 09/25/20 1523      PT LONG TERM GOAL #1   Title Pt will be I with advanced HEP    Time 6    Period Weeks    Status New    Target Date 11/06/20      PT LONG TERM GOAL #2   Title Pt will report 50% reduction of pain in R buttocks    Time 6    Period Weeks    Status New    Target Date 11/06/20      PT LONG TERM GOAL #3   Title Pt will report able to sit >30 min with </=  1/10 R buttocks pain    Time 6    Period Weeks    Status New    Target Date 11/06/20                  Plan - 09/25/20 1519    Clinical Impression Statement Pt presents to clinic with complaints of pain localized to the R buttocks starting ~6 mos prior with no known MOI. Pt denies N/T or radiating pain in RLE; reports hx of LBP with radiating RLE pain. Pt demos  significant stiffness in lumbar spine, deficits in LE flexibility esp R hip IR/ER, tenderness to palpation R glute med/piriformis, and lumbar AROM limitations. Pt also demos core weakness and significant increase in lumbar lordosis in standing. Pt would benefit from skilled PT to address the above impairments.    Examination-Activity Limitations Sit    Examination-Participation Restrictions Community Activity;Interpersonal Relationship    Stability/Clinical Decision Making Stable/Uncomplicated    Clinical Decision Making Low    Rehab Potential Good    PT Frequency 1x / week   pt requests 1x/week d/t copay   PT Duration 6 weeks    PT Treatment/Interventions ADLs/Self Care Home Management;Electrical Stimulation;Iontophoresis 75m/ml Dexamethasone;Moist Heat;Neuromuscular re-education;Balance training;Therapeutic exercise;Therapeutic activities;Patient/family education;Manual techniques;Dry needling;Passive range of motion    PT Next Visit Plan core stab, slowly introduce gym ex's, lumbar/LE flexibility, manual/modalities as indicated    PT Home Exercise Plan LTR, PPT and bridge, piriformis stretch    Consulted and Agree with Plan of Care Patient           Patient will benefit from skilled therapeutic intervention in order to improve the following deficits and impairments:  Abnormal gait, Increased muscle spasms, Pain, Decreased balance, Impaired flexibility, Improper body mechanics, Decreased strength, Postural dysfunction  Visit Diagnosis: Chronic bilateral low back pain without sciatica  Cramp and spasm  Muscle weakness (generalized)     Problem List Patient Active Problem List   Diagnosis Date Noted  . Piriformis syndrome of right side 08/07/2020  . Hamstring tendinitis of left thigh 03/20/2020  . Nasal polyps 06/07/2019  . Restrictive lung disease 11/15/2018  . Rhinitis, chronic 11/15/2018  . History of ETT 10/23/2018  . Globus sensation 07/29/2018  . Preoperative  cardiovascular examination 04/16/2018  . Arthropathy of right sacroiliac joint 04/08/2016  . Spinal stenosis, lumbar region, with neurogenic claudication 03/26/2016  . Sciatica of right side 06/13/2015  . CAD (coronary artery disease)   . Rotator cuff syndrome of left shoulder 01/27/2013  . Carotid artery stenosis 09/02/2012  . Physical exam, annual 06/23/2012  . Chronic cough 11/23/2009  . SKIN LESIONS, MULTIPLE 06/05/2009  . 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 DISEASE, PERIPHERAL VASCULAR NOS 08/02/2007  . VERTIGO 08/02/2007  . Mild cognitive impairment 04/27/2007  . Hyperlipidemia 03/17/2007  . Essential hypertension 03/17/2007   PHYSICAL THERAPY DISCHARGE SUMMARY  Visits from Start of Care: 1  Plan: Patient agrees to discharge.  Patient goals were partially met. Patient is being discharged due to not returning since the last visit.  ?????     AAmador Cunas PT, DPT ADonald ProseSugg 09/25/2020, 3:30 PM  [place] [address]Phone: [phone]  Fax:  [phone] Name: Robert HaszMRN: [id number] of Birth: [birth year]

## 2020-09-25 NOTE — Telephone Encounter (Signed)
No need for msg handled.Caren Griffins'

## 2020-09-26 ENCOUNTER — Other Ambulatory Visit: Payer: Self-pay | Admitting: Primary Care

## 2020-09-27 ENCOUNTER — Telehealth: Payer: Self-pay | Admitting: Primary Care

## 2020-09-27 NOTE — Telephone Encounter (Signed)
ATC Patient.  LM to call back. 

## 2020-10-01 NOTE — Telephone Encounter (Signed)
Pt's covid test from 09/25/20 has been faxed to our office in Beth's attn. Results were negative.  Attempted to call pt but unable to reach. Left message for him to return call.

## 2020-10-02 ENCOUNTER — Ambulatory Visit: Payer: Medicare Other | Admitting: Physical Therapy

## 2020-10-03 ENCOUNTER — Other Ambulatory Visit: Payer: Self-pay

## 2020-10-03 MED ORDER — HYDROCHLOROTHIAZIDE 25 MG PO TABS: 25.0000 mg | ORAL_TABLET | Freq: Every day | ORAL | 0 refills | Status: DC

## 2020-10-04 NOTE — Telephone Encounter (Signed)
Called and spoke to pt. Pt states he feels he is doing much better and denied needing an appt. Pt is aware to call our office if any s/s were to get worse or not improve. Nothing further needed at this time.

## 2020-10-11 ENCOUNTER — Other Ambulatory Visit: Payer: Self-pay

## 2020-10-11 DIAGNOSIS — I10 Essential (primary) hypertension: Secondary | ICD-10-CM

## 2020-10-11 DIAGNOSIS — I2510 Atherosclerotic heart disease of native coronary artery without angina pectoris: Secondary | ICD-10-CM

## 2020-10-11 DIAGNOSIS — E7800 Pure hypercholesterolemia, unspecified: Secondary | ICD-10-CM

## 2020-10-11 DIAGNOSIS — I6523 Occlusion and stenosis of bilateral carotid arteries: Secondary | ICD-10-CM

## 2020-10-11 MED ORDER — AMLODIPINE BESYLATE 5 MG PO TABS: 5.0000 mg | ORAL_TABLET | Freq: Every day | ORAL | 0 refills | Status: DC

## 2020-10-29 ENCOUNTER — Other Ambulatory Visit: Payer: Self-pay | Admitting: Family Medicine

## 2020-10-29 NOTE — Telephone Encounter (Signed)
Refill done.  

## 2020-10-30 ENCOUNTER — Ambulatory Visit: Payer: Medicare Other | Admitting: Cardiology

## 2020-10-30 ENCOUNTER — Encounter: Payer: Self-pay | Admitting: Cardiology

## 2020-10-30 ENCOUNTER — Other Ambulatory Visit: Payer: Self-pay

## 2020-10-30 VITALS — BP 132/60 | HR 62 | Ht 66.0 in | Wt 147.0 lb

## 2020-10-30 DIAGNOSIS — I6523 Occlusion and stenosis of bilateral carotid arteries: Secondary | ICD-10-CM

## 2020-10-30 DIAGNOSIS — E7800 Pure hypercholesterolemia, unspecified: Secondary | ICD-10-CM

## 2020-10-30 DIAGNOSIS — I2510 Atherosclerotic heart disease of native coronary artery without angina pectoris: Secondary | ICD-10-CM

## 2020-10-30 DIAGNOSIS — I10 Essential (primary) hypertension: Secondary | ICD-10-CM

## 2020-10-30 MED ORDER — CARVEDILOL 6.25 MG PO TABS: 6.2500 mg | ORAL_TABLET | Freq: Two times a day (BID) | ORAL | 3 refills | Status: DC

## 2020-10-30 MED ORDER — AMLODIPINE BESYLATE 5 MG PO TABS: 5.0000 mg | ORAL_TABLET | Freq: Every day | ORAL | 3 refills | Status: DC

## 2020-10-30 MED ORDER — ATORVASTATIN CALCIUM 80 MG PO TABS: 80.0000 mg | ORAL_TABLET | Freq: Every day | ORAL | 3 refills | Status: DC

## 2020-10-30 NOTE — Patient Instructions (Signed)

## 2020-10-30 NOTE — Progress Notes (Addendum)
Cardiology Office Note:    Date:  10/30/2020   ID:  [name], DOB 1934/12/26, MRN [id number]  PCP:  Midge Minium, MD  Cardiologist:  Fransico Him, MD    Referring MD: Midge Minium, MD   Chief Complaint  Patient presents with  . Coronary Artery Disease  . Hypertension  . Hyperlipidemia    History of Present Illness:    [name] is a 86 y.o. male with a hx of  ASCADs/p PCI of the RCA, HTN, Carotid artery stenosisand dyslipidemia. He is here today for followup and is doing well.  He denies any chest pain or pressure, SOB, DOE, PND, orthopnea, LE edema, dizziness, palpitations or syncope. He is compliant with his meds and is tolerating meds with no SE.  He recently has had a cough and chest congestion and is on Breo.    Past Medical History:  Diagnosis Date  . Allergy   . Arthritis    "scattered" (10/26/2018)  . Asthma   . C. difficile diarrhea   . CAD (coronary artery disease) 2005   s/p PCI of RCA  . Carotid artery stenosis    1-39% bilateral by dopplers 10/2016  . Carotid stenosis    "? side; Dr. Scot Dock"  . Cataract    NS OD  . Cognitive impairment, mild, so stated   . Colitis   . Erectile dysfunction   . GERD (gastroesophageal reflux disease)   . Hyperlipidemia   . Hypertension   . Perforated stomach, acute 10/23/2018  . Peripheral vascular disease ([place])   . Retinal detachment   . Vertebrobasilar artery syndrome   . Vertigo     Past Surgical History:  Procedure Laterality Date  . APPENDECTOMY  1954  . CATARACT EXTRACTION Left 08/08/2019   Dr. Bing Plume  . COLONOSCOPY    . ESOPHAGOGASTRODUODENOSCOPY (EGD) WITH ESOPHAGEAL DILATION  09/2018  . EYE SURGERY    . LAPAROTOMY N/A 10/23/2018   Procedure: EXPLORATORY LAPAROTOMY WITH GRAHAM PATCH FOR PERFORATED ULCER AND PLACEMENT OF GASTRIC TUBE;  Surgeon: Rolm Bookbinder, MD;  Location: [facility];  Service: General;  Laterality: N/A;  . NASAL SINUS SURGERY  05/2018  . RETINAL DETACHMENT SURGERY      "? side"  . UPPER GASTROINTESTINAL ENDOSCOPY      Current Medications: Current Meds  Medication Sig  . acetaminophen (TYLENOL) 500 MG tablet Take 500 mg by mouth 2 (two) times daily.  Marland Kitchen albuterol (PROVENTIL) (2.5 MG/3ML) 0.083% nebulizer solution Take 3 mLs (2.5 mg total) by nebulization every 6 (six) hours as needed for wheezing or shortness of breath.  Marland Kitchen albuterol (VENTOLIN HFA) 108 (90 Base) MCG/ACT inhaler INHALE 2 PUFFS INTO LUNGS EVERY 6 HOURS AS NEEDED FOR WHEEZING OR SHORTNESS OF BREATH.  Marland Kitchen amLODipine (NORVASC) 5 MG tablet Take 1 tablet (5 mg total) by mouth daily. Please keep upcoming appt in January 2022 with Dr. Radford Pax before anymore refills. Thank you  . aspirin EC 81 MG tablet Take 81 mg by mouth daily.  Marland Kitchen atorvastatin (LIPITOR) 80 MG tablet TAKE 1 TABLET BY MOUTH DAILY  . azelastine (ASTELIN) 0.1 % nasal spray Place 1 spray into both nostrils 2 (two) times daily.  . carvedilol (COREG) 6.25 MG tablet Take 1 tablet (6.25 mg total) by mouth 2 (two) times daily with a meal. Please make yearly appt with Dr. Radford Pax for November for future refills. Thank you 1st attempt  . cholecalciferol (VITAMIN D3) 25 MCG (1000 UT) tablet Take 1,000 Units by mouth daily.  Marland Kitchen  Diclofenac Sodium 1.5 % SOLN Fingertip size amount over most painful spot 2 times daily  . donepezil (ARICEPT) 10 MG tablet Take 1 tablet (10 mg total) by mouth daily.  Marland Kitchen doxycycline (VIBRA-TABS) 100 MG tablet Take 1 tablet (100 mg total) by mouth 2 (two) times daily.  . Eyelid Cleansers (OCUSOFT EYELID CLEANSING) PADS Place 1 application into both eyes 2 (two) times daily.  . fish oil-omega-3 fatty acids 1000 MG capsule Take 2 g by mouth 2 (two) times daily.   . fluticasone (FLONASE) 50 MCG/ACT nasal spray 1 spray each nostril twice daily  . fluticasone furoate-vilanterol (BREO ELLIPTA) 100-25 MCG/INH AEPB INHALE 1 PUFF INTO THE LUNGS DAILY  . fluticasone furoate-vilanterol (BREO ELLIPTA) 100-25 MCG/INH AEPB Inhale 1 puff into  the lungs daily.  Marland Kitchen gabapentin (NEURONTIN) 100 MG capsule Take 2 capsules (200 mg total) by mouth at bedtime.  . gabapentin (NEURONTIN) 300 MG capsule TAKE 1 CAPSULE(300 MG) BY MOUTH THREE TIMES DAILY  . hydrochlorothiazide (HYDRODIURIL) 25 MG tablet Take 1 tablet (25 mg total) by mouth daily. Please keep upcoming appt in January 2022 with Dr. Mayford Knife before anymore refills. Thank you  . levocetirizine (XYZAL) 5 MG tablet Take 1 tablet (5 mg total) by mouth every evening.  Marland Kitchen LOTEMAX SM 0.38 % GEL   . montelukast (SINGULAIR) 10 MG tablet Take 1 tablet (10 mg total) by mouth at bedtime.  Marland Kitchen ofloxacin (OCUFLOX) 0.3 % ophthalmic solution INSTILL 1 DROP IN LEFT EYE FOUR TIMES DAILY FOR 7 DAYS BEGINNING AFTER SURGERY  . Polyvinyl Alcohol-Povidone (REFRESH OP) Place 1 drop into both eyes 2 (two) times daily.  . prednisoLONE acetate (PRED FORTE) 1 % ophthalmic suspension Place 1 drop into the left eye every hour while awake.  Marland Kitchen Respiratory Therapy Supplies (FLUTTER) DEVI 1 Device by Does not apply route daily.  . Saw Palmetto 500 MG CAPS Take 1,000 mg by mouth 2 (two) times daily.   Marland Kitchen Spacer/Aero-Holding [facility] Use as directed.  . [DISCONTINUED] predniSONE (DELTASONE) 10 MG tablet Take 4 tabs po daily x 2 days; then 3 tabs for 2 days; then 2 tabs for 2 days; then 1 tab for 2 days     Allergies:   Neosporin [bacitracin-polymyxin b]   Social History   Socioeconomic History  . Marital status: Married    Spouse name: Not on file  . Number of children: 3  . Years of education: Not on file  . Highest education level: Not on file  Occupational History  . Occupation: Retired Teaching laboratory technician: RETIRED  Tobacco Use  . Smoking status: Former Smoker    Years: 2.00    Types: Cigars    Quit date: 05/13/1961    Years since quitting: 59.5  . Smokeless tobacco: Never Used  . Tobacco comment: "never inhaled" in the early 60's   Vaping Use  . Vaping Use: Never used  Substance and  Sexual Activity  . Alcohol use: Yes    Alcohol/week: 14.0 standard drinks    Types: 14 Glasses of wine per week  . Drug use: Never  . Sexual activity: Not Currently  Other Topics Concern  . Not on file  Social History Narrative   Married with 1 son and 2 daughters.  Retired.   2 caffeinated beverages daily, 3 glasses of wine daily   Never smoker no drug use no tobacco   Social Determinants of Corporate investment banker Strain: Not on file  Food Insecurity:  Not on file  Transportation Needs: Not on file  Physical Activity: Not on file  Stress: Not on file  Social Connections: Not on file     Family History: The patient's family history includes Colon polyps in his father; Deep vein thrombosis in his father and mother; Diverticulitis in his father; Hyperlipidemia in his father and mother; Hypertension in his father and mother; Stroke in his father and mother; Ulcerative colitis in his father. There is no history of Colon cancer, Esophageal cancer, Stomach cancer, or Rectal cancer.  ROS:   Please see the history of present illness.    ROS  All other systems reviewed and negative.   EKGs/Labs/Other Studies Reviewed:    The following studies were reviewed today: none  EKG:  EKG is not ordered today.    Recent Labs: 05/07/2020: ALT 20; BUN 22; Creatinine, Ser 1.06; Hemoglobin 14.4; Platelets 187.0; Potassium 3.9; Sodium 140; TSH 3.32   Recent Lipid Panel    Component Value Date/Time   CHOL 113 05/07/2020 1046   CHOL 89 (L) 09/27/2019 0810   TRIG 101.0 05/07/2020 1046   HDL 41.00 05/07/2020 1046   HDL 34 (L) 09/27/2019 0810   CHOLHDL 3 05/07/2020 1046   VLDL 20.2 05/07/2020 1046   LDLCALC 52 05/07/2020 1046   LDLCALC 34 09/27/2019 0810    Physical Exam:    VS:  BP 132/60   Pulse 62   Ht 5\' 6"  (1.676 m)   Wt 147 lb (66.7 kg)   SpO2 98%   BMI 23.73 kg/m     Wt Readings from Last 3 Encounters:  10/30/20 147 lb (66.7 kg)  09/24/20 146 lb (66.2 kg)  08/07/20  146 lb (66.2 kg)     GEN: Well nourished, well developed in no acute distress HEENT: Normal NECK: No JVD; No carotid bruits LYMPHATICS: No lymphadenopathy CARDIAC:RRR, no murmurs, rubs, gallops RESPIRATORY:  Scattered wheezes and rhonchi posteriorly ABDOMEN: Soft, non-tender, non-distended MUSCULOSKELETAL:  No edema; No deformity  SKIN: Warm and dry NEUROLOGIC:  Alert and oriented x 3 PSYCHIATRIC:  Normal affect    ASSESSMENT:    1. Coronary artery disease involving native coronary artery of native heart without angina pectoris   2. Essential hypertension   3. Bilateral carotid artery stenosis   4. Pure hypercholesterolemia    PLAN:    In order of problems listed above:  1.  ASCAD -s/p PCI of the RCA -denies any anginal CP -continue ASA, BB, statin  2.  HTN -BP controlled -continue Carvedilol 6.25mg  BID, HCTZ 25mg  daily and amlodipine 5mg  daily -SCR stable at 1.06 and K+ 3.9 in July 2021  3.  Bilateral carotid artery stenosis -dopplers 10/2018 showed 1-39% right and 40-59% left carotid stenosis -repeat dopplers 10/2019 -continue ASA and statin  4.  HLD -LDL goal < 70. -LDL was 52 in July 2021 -continue Lipitor 80mg  daily  5.  Cough -he is having increased cough and lungs have wheezes -he denies any fever or chills -he has followup with his PCP on Thursday -I instructed him to call PCP if sx worsen   Medication Adjustments/Labs and Tests Ordered: Current medicines are reviewed at length with the patient today.  Concerns regarding medicines are outlined above.  Orders Placed This Encounter  Procedures  . EKG 12-Lead   No orders of the defined types were placed in this encounter.   Signed, 11/2019, MD  10/30/2020 10:39 AM    [facility]

## 2020-10-30 NOTE — Addendum Note (Signed)
Addended by: Theresia Majors on: 10/30/2020 10:48 AM   Modules accepted: Orders

## 2020-11-02 ENCOUNTER — Encounter: Payer: Self-pay | Admitting: Family Medicine

## 2020-11-02 ENCOUNTER — Ambulatory Visit (INDEPENDENT_AMBULATORY_CARE_PROVIDER_SITE_OTHER): Payer: Medicare Other | Admitting: Family Medicine

## 2020-11-02 ENCOUNTER — Other Ambulatory Visit: Payer: Self-pay

## 2020-11-02 VITALS — BP 118/70 | HR 63 | Temp 97.4°F | Resp 19 | Ht 66.0 in | Wt 148.6 lb

## 2020-11-02 DIAGNOSIS — Z20822 Contact with and (suspected) exposure to covid-19: Secondary | ICD-10-CM

## 2020-11-02 DIAGNOSIS — E7800 Pure hypercholesterolemia, unspecified: Secondary | ICD-10-CM | POA: Diagnosis not present

## 2020-11-02 DIAGNOSIS — I10 Essential (primary) hypertension: Secondary | ICD-10-CM | POA: Diagnosis not present

## 2020-11-02 NOTE — Assessment & Plan Note (Signed)
Chronic problem.  Excellent control on Amlodipine 5mg  daily, Coreg 6.25mg  BID, HCTZ 25mg  daily.  Asymptomatic at this time.  Check labs.  No anticipated med changes.  Will follow.

## 2020-11-02 NOTE — Patient Instructions (Signed)
Schedule your complete physical in 6 months We'll notify you of your lab results and make any changes if needed Be on the lookout for any possible COVID symptoms.  If they arise, please get tested and let me know Call with any questions or concerns Stay Safe!  Stay Healthy! Happy New Year!!!

## 2020-11-02 NOTE — Progress Notes (Signed)
   Subjective:    Patient ID: [name], male    DOB: [birth year], 86 y.o.   MRN: [id number]  HPI HTN- chronic problem, on Amlodipine 5mg  daily, Coreg 6.25mg  BID, HCTZ 25mg  daily.  Denies CP, SOB, HAs, visual changes, edema.  Hyperlipidemia- chronic problem, on Lipitor 80mg  daily.  Denies abd pain, N/V.  Possible COVID exposure- pt has had both vaccines and booster.  2 days ago was with grandsons and today found out their mom is COVID +.  Denies cough, chills, body aches, sweats.  Review of Systems For ROS see HPI   This visit occurred during the SARS-CoV-2 public health emergency.  Safety protocols were in place, including screening questions prior to the visit, additional usage of staff PPE, and extensive cleaning of exam room while observing appropriate contact time as indicated for disinfecting solutions.       Objective:   Physical Exam Vitals reviewed.  Constitutional:      General: He is not in acute distress.    Appearance: Normal appearance. He is well-developed and well-nourished.  HENT:     Head: Normocephalic and atraumatic.  Eyes:     Extraocular Movements: EOM normal.     Conjunctiva/sclera: Conjunctivae normal.     Pupils: Pupils are equal, round, and reactive to light.  Neck:     Thyroid: No thyromegaly.  Cardiovascular:     Rate and Rhythm: Normal rate and regular rhythm.     Pulses: Intact distal pulses.     Heart sounds: Normal heart sounds. No murmur heard.   Pulmonary:     Effort: Pulmonary effort is normal. No respiratory distress.     Breath sounds: Wheezing (bilateral upper lobe inspiratory wheeze) present.  Abdominal:     General: Bowel sounds are normal. There is no distension.     Palpations: Abdomen is soft.  Musculoskeletal:        General: No edema.     Cervical back: Normal range of motion and neck supple.  Lymphadenopathy:     Cervical: No cervical adenopathy.  Skin:    General: Skin is warm and dry.  Neurological:     Mental Status:  He is alert and oriented to person, place, and time.     Cranial Nerves: No cranial nerve deficit.  Psychiatric:        Mood and Affect: Mood and affect normal.        Behavior: Behavior normal.           Assessment & Plan:  Possible COVID exposure- new.  Spent the day w/ his grandsons 2 days ago and they just found out today that their mother is +.  This has pt very concerned.  Told him that he is vaccinated and boosted which is the best that he can do.  He is to monitor closely for any symptoms and if his grandchildren do test + he can be tested.  Pt expressed understanding and is in agreement w/ plan.

## 2020-11-02 NOTE — Assessment & Plan Note (Signed)
Chronic problem.  Tolerating Lipitor 80mg daily w/o difficulty.  Check labs.  Adjust meds prn  

## 2020-11-03 LAB — BASIC METABOLIC PANEL
BUN: 20 mg/dL (ref 7–25)
CO2: 30 mmol/L (ref 20–32)
Calcium: 9.3 mg/dL (ref 8.6–10.3)
Chloride: 102 mmol/L (ref 98–110)
Creat: 1.09 mg/dL (ref 0.70–1.11)
Glucose, Bld: 85 mg/dL (ref 65–99)
Potassium: 3.6 mmol/L (ref 3.5–5.3)
Sodium: 140 mmol/L (ref 135–146)

## 2020-11-03 LAB — CBC WITH DIFFERENTIAL/PLATELET
Absolute Monocytes: 974 cells/uL — ABNORMAL HIGH (ref 200–950)
Basophils Absolute: 92 cells/uL (ref 0–200)
Basophils Relative: 1.1 %
Eosinophils Absolute: 109 cells/uL (ref 15–500)
Eosinophils Relative: 1.3 %
HCT: 39.8 % (ref 38.5–50.0)
Hemoglobin: 13.3 g/dL (ref 13.2–17.1)
Lymphs Abs: 2083 cells/uL (ref 850–3900)
MCH: 28.7 pg (ref 27.0–33.0)
MCHC: 33.4 g/dL (ref 32.0–36.0)
MCV: 86 fL (ref 80.0–100.0)
MPV: 10.7 fL (ref 7.5–12.5)
Monocytes Relative: 11.6 %
Neutro Abs: 5141 cells/uL (ref 1500–7800)
Neutrophils Relative %: 61.2 %
Platelets: 217 10*3/uL (ref 140–400)
RBC: 4.63 10*6/uL (ref 4.20–5.80)
RDW: 13.8 % (ref 11.0–15.0)
Total Lymphocyte: 24.8 %
WBC: 8.4 10*3/uL (ref 3.8–10.8)

## 2020-11-03 LAB — LIPID PANEL
Cholesterol: 115 mg/dL (ref <200)
HDL: 34 mg/dL — ABNORMAL LOW (ref >=40)
LDL Cholesterol (Calc): 59 mg/dL (calc)
Non-HDL Cholesterol (Calc): 81 mg/dL (calc) (ref <130)
Total CHOL/HDL Ratio: 3.4 (calc) (ref <5.0)
Triglycerides: 134 mg/dL (ref <150)

## 2020-11-03 LAB — HEPATIC FUNCTION PANEL
AG Ratio: 1.4 (calc) (ref 1.0–2.5)
ALT: 29 U/L (ref 9–46)
AST: 32 U/L (ref 10–35)
Albumin: 4 g/dL (ref 3.6–5.1)
Alkaline phosphatase (APISO): 80 U/L (ref 35–144)
Bilirubin, Direct: 0.2 mg/dL (ref 0.0–0.2)
Globulin: 2.8 g/dL (calc) (ref 1.9–3.7)
Indirect Bilirubin: 0.5 mg/dL (calc) (ref 0.2–1.2)
Total Bilirubin: 0.7 mg/dL (ref 0.2–1.2)
Total Protein: 6.8 g/dL (ref 6.1–8.1)

## 2020-11-03 LAB — TSH: TSH: 3.3 mIU/L (ref 0.40–4.50)

## 2020-11-05 ENCOUNTER — Ambulatory Visit: Payer: Medicare Other | Admitting: Family Medicine

## 2020-11-07 ENCOUNTER — Ambulatory Visit: Payer: Medicare Other | Admitting: Family Medicine

## 2020-11-15 ENCOUNTER — Other Ambulatory Visit: Payer: Self-pay

## 2020-11-15 ENCOUNTER — Encounter: Payer: Self-pay | Admitting: Family Medicine

## 2020-11-15 ENCOUNTER — Ambulatory Visit: Payer: Medicare Other | Admitting: Family Medicine

## 2020-11-15 DIAGNOSIS — I2510 Atherosclerotic heart disease of native coronary artery without angina pectoris: Secondary | ICD-10-CM

## 2020-11-15 DIAGNOSIS — M48062 Spinal stenosis, lumbar region with neurogenic claudication: Secondary | ICD-10-CM | POA: Diagnosis not present

## 2020-11-15 DIAGNOSIS — I6523 Occlusion and stenosis of bilateral carotid arteries: Secondary | ICD-10-CM

## 2020-11-15 DIAGNOSIS — I10 Essential (primary) hypertension: Secondary | ICD-10-CM

## 2020-11-15 DIAGNOSIS — E7800 Pure hypercholesterolemia, unspecified: Secondary | ICD-10-CM

## 2020-11-15 MED ORDER — AMLODIPINE BESYLATE 5 MG PO TABS: 5.0000 mg | ORAL_TABLET | Freq: Every day | ORAL | 3 refills | Status: DC

## 2020-11-15 NOTE — Patient Instructions (Addendum)
Good to see you MRI lumbar and pelvis See me again in 5-6 weeks

## 2020-11-15 NOTE — Progress Notes (Signed)
[name] [address] [place] [address] Phone: [phone] Subjective:   I [name] am serving as a Neurosurgeon for Dr. Antoine Primas.  This visit occurred during the SARS-CoV-2 public health emergency.  Safety protocols were in place, including screening questions prior to the visit, additional usage of staff PPE, and extensive cleaning of exam room while observing appropriate contact time as indicated for disinfecting solutions.   I'm seeing this patient by the request  of:  Sheliah Hatch, MD  CC: Right-sided back pain follow-up  QIO:[id number]   09/24/2020 Patient continues to have tightness at this time.  Discussed with patient that we could consider secondary to the ossification centers the possibility of MRI.  Patient declined and would like to start with physical therapy.  I think this is reasonable with patient having no fevers, chills, any abnormal weight loss and no nighttime pain.  Patient does have known spinal stenosis that could be contributing.  Patient is already on a significant amount of different medications and does not want to add any other ones at the moment.  Encouraged him to continue the home exercises.  Follow-up again in 5 to 6 weeks and at that time we will either consider the possible injection in the area where patient is having the most pain in the piriformis in the gluteal area or MRI of the pelvis.  Patient is in agreement with the plan.  Total time reviewing patient's imaging and talking to patient today 33 minutes  Update 11/15/2020 Calin Fantroy is a 86 y.o. male coming in with complaint of right sided back pain. Patient states it has gotten better. Sitting makes it worse moving around makes it better. Hard to get out of the car. Patient still describes the pain as a dull, throbbing aching pain. Patient states when he gets up though he seems to do relatively well overall. Patient states can walk with very minimal  discomfort and pain now. Patient does notice sometimes that his legs do seem to get weak. Continues to take the gabapentin fairly regularly.      Past Medical History:  Diagnosis Date  . Allergy   . Arthritis    "scattered" (10/26/2018)  . Asthma   . C. difficile diarrhea   . CAD (coronary artery disease) 2005   s/p PCI of RCA  . Carotid artery stenosis    1-39% bilateral by dopplers 10/2016  . Carotid stenosis    "? side; Dr. Edilia Bo"  . Cataract    NS OD  . Cognitive impairment, mild, so stated   . Colitis   . Erectile dysfunction   . GERD (gastroesophageal reflux disease)   . Hyperlipidemia   . Hypertension   . Perforated stomach, acute 10/23/2018  . Peripheral vascular disease ([facility])   . Retinal detachment   . Vertebrobasilar artery syndrome   . Vertigo    Past Surgical History:  Procedure Laterality Date  . APPENDECTOMY  1954  . CATARACT EXTRACTION Left 08/08/2019   Dr. Hazle Quant  . COLONOSCOPY    . ESOPHAGOGASTRODUODENOSCOPY (EGD) WITH ESOPHAGEAL DILATION  09/2018  . EYE SURGERY    . LAPAROTOMY N/A 10/23/2018   Procedure: EXPLORATORY LAPAROTOMY WITH GRAHAM PATCH FOR PERFORATED ULCER AND PLACEMENT OF GASTRIC TUBE;  Surgeon: Emelia Loron, MD;  Location[facility];  Service: General;  Laterality: N/A;  . NASAL SINUS SURGERY  05/2018  . RETINAL DETACHMENT SURGERY     "? side"  . UPPER GASTROINTESTINAL ENDOSCOPY  Social History   Socioeconomic History  . Marital status: Married    Spouse name: Not on file  . Number of children: 3  . Years of education: Not on file  . Highest education level: Not on file  Occupational History  . Occupation: Retired Teaching laboratory technician: RETIRED  Tobacco Use  . Smoking status: Former Smoker    Years: 2.00    Types: Cigars    Quit date: 05/13/1961    Years since quitting: 59.5  . Smokeless tobacco: Never Used  . Tobacco comment: "never inhaled" in the early 60's   Vaping Use  . Vaping Use: Never used   Substance and Sexual Activity  . Alcohol use: Yes    Alcohol/week: 14.0 standard drinks    Types: 14 Glasses of wine per week  . Drug use: Never  . Sexual activity: Not Currently  Other Topics Concern  . Not on file  Social History Narrative   Married with 1 son and 2 daughters.  Retired.   2 caffeinated beverages daily, 3 glasses of wine daily   Never smoker no drug use no tobacco   Social Determinants of Corporate investment banker Strain: Not on file  Food Insecurity: Not on file  Transportation Needs: Not on file  Physical Activity: Not on file  Stress: Not on file  Social Connections: Not on file   Allergies  Allergen Reactions  . Neosporin [Bacitracin-Polymyxin B] Other (See Comments)    Redness   Family History  Problem Relation Age of Onset  . Stroke Mother   . Hyperlipidemia Mother   . Hypertension Mother   . Deep vein thrombosis Mother   . Stroke Father   . Hyperlipidemia Father   . Hypertension Father   . Deep vein thrombosis Father   . Diverticulitis Father   . Ulcerative colitis Father   . Colon polyps Father   . Colon cancer Neg Hx   . Esophageal cancer Neg Hx   . Stomach cancer Neg Hx   . Rectal cancer Neg Hx      Current Outpatient Medications (Cardiovascular):  .  amLODipine (NORVASC) 5 MG tablet, Take 1 tablet (5 mg total) by mouth daily. Marland Kitchen  atorvastatin (LIPITOR) 80 MG tablet, Take 1 tablet (80 mg total) by mouth daily. .  carvedilol (COREG) 6.25 MG tablet, Take 1 tablet (6.25 mg total) by mouth 2 (two) times daily with a meal. .  hydrochlorothiazide (HYDRODIURIL) 25 MG tablet, Take 1 tablet (25 mg total) by mouth daily. Please keep upcoming appt in January 2022 with Dr. Mayford Knife before anymore refills. Thank you  Current Outpatient Medications (Respiratory):  .  albuterol (PROVENTIL) (2.5 MG/3ML) 0.083% nebulizer solution, Take 3 mLs (2.5 mg total) by nebulization every 6 (six) hours as needed for wheezing or shortness of breath. Marland Kitchen  albuterol  (VENTOLIN HFA) 108 (90 Base) MCG/ACT inhaler, INHALE 2 PUFFS INTO LUNGS EVERY 6 HOURS AS NEEDED FOR WHEEZING OR SHORTNESS OF BREATH. Marland Kitchen  azelastine (ASTELIN) 0.1 % nasal spray, Place 1 spray into both nostrils 2 (two) times daily. .  fluticasone (FLONASE) 50 MCG/ACT nasal spray, 1 spray each nostril twice daily .  fluticasone furoate-vilanterol (BREO ELLIPTA) 100-25 MCG/INH AEPB, INHALE 1 PUFF INTO THE LUNGS DAILY .  fluticasone furoate-vilanterol (BREO ELLIPTA) 100-25 MCG/INH AEPB, Inhale 1 puff into the lungs daily.  Current Outpatient Medications (Analgesics):  .  acetaminophen (TYLENOL) 500 MG tablet, Take 500 mg by mouth 2 (two) times daily. Marland Kitchen  aspirin EC 81 MG tablet, Take 81 mg by mouth daily.   Current Outpatient Medications (Other):  .  cholecalciferol (VITAMIN D3) 25 MCG (1000 UT) tablet, Take 1,000 Units by mouth daily. Marland Kitchen  donepezil (ARICEPT) 10 MG tablet, Take 1 tablet (10 mg total) by mouth daily. .  Eyelid Cleansers (OCUSOFT EYELID CLEANSING) PADS, Place 1 application into both eyes 2 (two) times daily. .  fish oil-omega-3 fatty acids 1000 MG capsule, Take 2 g by mouth 2 (two) times daily.  Marland Kitchen  gabapentin (NEURONTIN) 100 MG capsule, Take 2 capsules (200 mg total) by mouth at bedtime. .  gabapentin (NEURONTIN) 300 MG capsule, TAKE 1 CAPSULE(300 MG) BY MOUTH THREE TIMES DAILY .  ofloxacin (OCUFLOX) 0.3 % ophthalmic solution, INSTILL 1 DROP IN LEFT EYE FOUR TIMES DAILY FOR 7 DAYS BEGINNING AFTER SURGERY .  Polyvinyl Alcohol-Povidone (REFRESH OP), Place 1 drop into both eyes 2 (two) times daily. .  Saw Palmetto 500 MG CAPS, Take 1,000 mg by mouth 2 (two) times daily.    Reviewed prior external information including notes and imaging from  primary care provider As well as notes that were available from care everywhere and other healthcare systems.  Past medical history, social, surgical and family history all reviewed in electronic medical record.  No pertanent information unless  stated regarding to the chief complaint.   Review of Systems:  No headache, visual changes, nausea, vomiting, diarrhea, constipation, dizziness, abdominal pain, skin rash, fevers, chills, night sweats, weight loss, swollen lymph nodes, body aches, joint swelling, chest pain, shortness of breath, mood changes. POSITIVE muscle aches mild  Objective  Blood pressure 128/64, pulse 61, height 5\' 6"  (1.676 m), weight 146 lb (66.2 kg), SpO2 98 %.   General: No apparent distress alert and oriented x3 mood and affect normal, dressed appropriately.  HEENT: Pupils equal, extraocular movements intact  Respiratory: Patient's speak in full sentences and does not appear short of breath  Cardiovascular: No lower extremity edema, non tender, no erythema  Gait severely antalgic gait though. Patient does have weakness in his gait. This seems a little worse at the moment. Patient's wife who accompanies him states she has noticed this as well Significant loss of lordosis of the lumbar spine. Patient does have atrophy noted of the gluteal areas bilaterally. Patient has very limited extension of the back but does have relatively good flexion. Some mild pain noted in the gluteal area right greater than left. Mild increase in discomfort with FABER test on the right. Tightness with straight leg test but no true radicular symptoms. Weakness noted of the lower extremities with hip flexion, knee extension and plantar flexion with 4 out of 5 strength but symmetric   Impression and Recommendations:     The above documentation has been reviewed and is accurate and complete , DO

## 2020-11-15 NOTE — Assessment & Plan Note (Addendum)
Known significant lumbar radiculopathy. Patient has had difficulty with this previously. Does have calcific changes or ossification centers noted just inferior to the ischial tuberosity right greater than left on x-rays. Patient still states that when he goes from a sitting to standing position continues to have pain and then seems to get better with movement patient has been to physical therapy 1 time. Weakness noted of lower extremities and calcific changes noted of the hamstring and surrounding of the hip itself mostly posteriorly. I like to further evaluate to see if that is causing some of the discomfort and pain. We discussed with patient that the only change in management though would be potentially injections or surgical intervention. Patient would be willing to consider the injections with him having such good response previously of the epidurals. Patient will get imaging and then we will discuss further.

## 2020-11-20 ENCOUNTER — Other Ambulatory Visit: Payer: Self-pay

## 2020-11-20 MED ORDER — CARVEDILOL 6.25 MG PO TABS: 6.2500 mg | ORAL_TABLET | Freq: Two times a day (BID) | ORAL | 3 refills | Status: DC

## 2020-11-28 ENCOUNTER — Telehealth: Payer: Self-pay | Admitting: Primary Care

## 2020-11-29 MED ORDER — MONTELUKAST SODIUM 10 MG PO TABS: 10.0000 mg | ORAL_TABLET | Freq: Every day | ORAL | 11 refills | Status: DC

## 2020-11-29 NOTE — Telephone Encounter (Signed)
Rx sent 

## 2020-12-01 ENCOUNTER — Other Ambulatory Visit: Payer: Medicare Other

## 2020-12-07 ENCOUNTER — Ambulatory Visit
Admission: RE | Admit: 2020-12-07 | Discharge: 2020-12-07 | Disposition: A | Discharge status: Home / Self Care | Payer: Medicare Other | Source: Ambulatory Visit | Attending: Family Medicine | Admitting: Family Medicine

## 2020-12-07 ENCOUNTER — Other Ambulatory Visit: Payer: Self-pay

## 2020-12-07 DIAGNOSIS — S76311A Strain of muscle, fascia and tendon of the posterior muscle group at thigh level, right thigh, initial encounter: Secondary | ICD-10-CM | POA: Diagnosis not present

## 2020-12-07 DIAGNOSIS — M48062 Spinal stenosis, lumbar region with neurogenic claudication: Secondary | ICD-10-CM

## 2020-12-07 DIAGNOSIS — S76312A Strain of muscle, fascia and tendon of the posterior muscle group at thigh level, left thigh, initial encounter: Secondary | ICD-10-CM | POA: Diagnosis not present

## 2020-12-07 DIAGNOSIS — S73192A Other sprain of left hip, initial encounter: Secondary | ICD-10-CM | POA: Diagnosis not present

## 2020-12-07 DIAGNOSIS — M545 Low back pain, unspecified: Secondary | ICD-10-CM | POA: Diagnosis not present

## 2020-12-09 ENCOUNTER — Encounter: Payer: Self-pay | Admitting: Family Medicine

## 2020-12-10 ENCOUNTER — Other Ambulatory Visit: Payer: Self-pay

## 2020-12-10 DIAGNOSIS — M5416 Radiculopathy, lumbar region: Secondary | ICD-10-CM

## 2020-12-10 NOTE — Telephone Encounter (Signed)
Patient called to let Dr Katrinka Blazing know that he is scheduled for his epidural on 12/13/2020.

## 2020-12-13 ENCOUNTER — Ambulatory Visit
Admission: RE | Admit: 2020-12-13 | Discharge: 2020-12-13 | Disposition: A | Discharge status: Home / Self Care | Payer: Medicare Other | Source: Ambulatory Visit | Attending: Family Medicine | Admitting: Family Medicine

## 2020-12-13 ENCOUNTER — Other Ambulatory Visit: Payer: Self-pay

## 2020-12-13 DIAGNOSIS — M7138 Other bursal cyst, other site: Secondary | ICD-10-CM | POA: Diagnosis not present

## 2020-12-13 DIAGNOSIS — M5416 Radiculopathy, lumbar region: Secondary | ICD-10-CM

## 2020-12-13 MED ORDER — IOPAMIDOL (ISOVUE-M 200) INJECTION 41%
1.0000 mL | Freq: Once | INTRAMUSCULAR | Status: AC
  Administered 2020-12-13: 1 mL via EPIDURAL

## 2020-12-13 MED ORDER — METHYLPREDNISOLONE ACETATE 40 MG/ML INJ SUSP (RADIOLOG
120.0000 mg | Freq: Once | INTRAMUSCULAR | Status: AC
  Administered 2020-12-13: 120 mg via EPIDURAL

## 2020-12-13 NOTE — Discharge Instructions (Signed)

## 2020-12-20 ENCOUNTER — Ambulatory Visit: Payer: Medicare Other | Admitting: Family Medicine

## 2020-12-24 ENCOUNTER — Other Ambulatory Visit: Payer: Self-pay

## 2020-12-24 DIAGNOSIS — I10 Essential (primary) hypertension: Secondary | ICD-10-CM

## 2020-12-24 MED ORDER — HYDROCHLOROTHIAZIDE 25 MG PO TABS: 25.0000 mg | ORAL_TABLET | Freq: Every day | ORAL | 3 refills | Status: DC

## 2020-12-24 MED ORDER — DONEPEZIL HCL 10 MG PO TABS: 10.0000 mg | ORAL_TABLET | Freq: Every day | ORAL | 1 refills | Status: DC

## 2021-01-09 NOTE — Progress Notes (Unsigned)
[name] [address] [place] [address] Phone: [phone] Subjective:   I [name] am serving as a Neurosurgeon for Dr. Antoine Primas.  This visit occurred during the SARS-CoV-2 public health emergency.  Safety protocols were in place, including screening questions prior to the visit, additional usage of staff PPE, and extensive cleaning of exam room while observing appropriate contact time as indicated for disinfecting solutions.   I'm seeing this patient by the request  of:  Sheliah Hatch, MD  CC: Low back and buttocks pain follow-up  [id number]   11/15/2020 Known significant lumbar radiculopathy. Patient has had difficulty with this previously. Does have calcific changes or ossification centers noted just inferior to the ischial tuberosity right greater than left on x-rays. Patient still states that when he goes from a sitting to standing position continues to have pain and then seems to get better with movement patient has been to physical therapy 1 time. Weakness noted of lower extremities and calcific changes noted of the hamstring and surrounding of the hip itself mostly posteriorly. I like to further evaluate to see if that is causing some of the discomfort and pain. We discussed with patient that the only change in management though would be potentially injections or surgical intervention. Patient would be willing to consider the injections with him having such good response previously of the epidurals. Patient will get imaging and then we will discuss further.  Update 01/10/2021 [name] is a 86 y.o. male coming in with complaint of low back pain. Epidural 12/13/2020. Patient states he still has some issues. Painful with getting up.  Patient states that is having no significant back pain at all.  Patient states daily activities are fine, going up and down stairs no pain.  Patient only has the pain when going from a sitting to standing  position.  Once he stands after 2 seconds no pain at all.  Patient will has had a couple times where he is almost fallen though getting out of the car when he forgets about it but is not following at this time.  MRI lumbar 12/06/2020  IMPRESSION: Moderate spinal canal and severe to marked bilateral neural foraminal narrowing at the L2-3 and L3-4 levels.  Severe bilateral L4-5 and moderate bilateral L5-S1 neural foraminal narrowing.  Anteriorly projecting 6 mm right L3-4 facet synovial cyst.  MRI pelvis 12/06/2020 IMPRESSION: No acute fracture or dislocation.  Heterotopic ossifications seen within the soft tissues subjacent to the hips bilaterally, right greater than left, possibly posttraumatic or post inflammatory in nature.     Past Medical History:  Diagnosis Date  . Allergy   . Arthritis    "scattered" (10/26/2018)  . Asthma   . C. difficile diarrhea   . CAD (coronary artery disease) 2005   s/p PCI of RCA  . Carotid artery stenosis    1-39% bilateral by dopplers 10/2016  . Carotid stenosis    "? side; Dr. Edilia Bo"  . Cataract    NS OD  . Cognitive impairment, mild, so stated   . Colitis   . Erectile dysfunction   . GERD (gastroesophageal reflux disease)   . Hyperlipidemia   . Hypertension   . Perforated stomach, acute 10/23/2018  . Peripheral vascular disease (HCC)   . Retinal detachment   . Vertebrobasilar artery syndrome   . Vertigo    Past Surgical History:  Procedure Laterality Date  . APPENDECTOMY  1954  . CATARACT EXTRACTION Left 08/08/2019  Dr. Hazle Quant  . COLONOSCOPY    . ESOPHAGOGASTRODUODENOSCOPY (EGD) WITH ESOPHAGEAL DILATION  09/2018  . EYE SURGERY    . LAPAROTOMY N/A 10/23/2018   Procedure: EXPLORATORY LAPAROTOMY WITH GRAHAM PATCH FOR PERFORATED ULCER AND PLACEMENT OF GASTRIC TUBE;  Surgeon: Emelia Loron, MD;  Location: [facility] OR;  Service: General;  Laterality: N/A;  . NASAL SINUS SURGERY  05/2018  . RETINAL DETACHMENT SURGERY     "?  side"  . UPPER GASTROINTESTINAL ENDOSCOPY     Social History   Socioeconomic History  . Marital status: Married    Spouse name: Not on file  . Number of children: 3  . Years of education: Not on file  . Highest education level: Not on file  Occupational History  . Occupation: Retired Teaching laboratory technician: RETIRED  Tobacco Use  . Smoking status: Former Smoker    Years: 2.00    Types: Cigars    Quit date: 05/13/1961    Years since quitting: 59.7  . Smokeless tobacco: Never Used  . Tobacco comment: "never inhaled" in the early 60's   Vaping Use  . Vaping Use: Never used  Substance and Sexual Activity  . Alcohol use: Yes    Alcohol/week: 14.0 standard drinks    Types: 14 Glasses of wine per week  . Drug use: Never  . Sexual activity: Not Currently  Other Topics Concern  . Not on file  Social History Narrative   Married with 1 son and 2 daughters.  Retired.   2 caffeinated beverages daily, 3 glasses of wine daily   Never smoker no drug use no tobacco   Social Determinants of Corporate investment banker Strain: Not on file  Food Insecurity: Not on file  Transportation Needs: Not on file  Physical Activity: Not on file  Stress: Not on file  Social Connections: Not on file   Allergies  Allergen Reactions  . Neosporin [Bacitracin-Polymyxin B] Other (See Comments)    Redness   Family History  Problem Relation Age of Onset  . Stroke Mother   . Hyperlipidemia Mother   . Hypertension Mother   . Deep vein thrombosis Mother   . Stroke Father   . Hyperlipidemia Father   . Hypertension Father   . Deep vein thrombosis Father   . Diverticulitis Father   . Ulcerative colitis Father   . Colon polyps Father   . Colon cancer Neg Hx   . Esophageal cancer Neg Hx   . Stomach cancer Neg Hx   . Rectal cancer Neg Hx      Current Outpatient Medications (Cardiovascular):  .  amLODipine (NORVASC) 5 MG tablet, Take 1 tablet (5 mg total) by mouth daily. Marland Kitchen   atorvastatin (LIPITOR) 80 MG tablet, Take 1 tablet (80 mg total) by mouth daily. .  carvedilol (COREG) 6.25 MG tablet, Take 1 tablet (6.25 mg total) by mouth 2 (two) times daily with a meal. .  hydrochlorothiazide (HYDRODIURIL) 25 MG tablet, Take 1 tablet (25 mg total) by mouth daily.  Current Outpatient Medications (Respiratory):  .  albuterol (PROVENTIL) (2.5 MG/3ML) 0.083% nebulizer solution, Take 3 mLs (2.5 mg total) by nebulization every 6 (six) hours as needed for wheezing or shortness of breath. Marland Kitchen  albuterol (VENTOLIN HFA) 108 (90 Base) MCG/ACT inhaler, INHALE 2 PUFFS INTO LUNGS EVERY 6 HOURS AS NEEDED FOR WHEEZING OR SHORTNESS OF BREATH. Marland Kitchen  azelastine (ASTELIN) 0.1 % nasal spray, Place 1 spray into both nostrils 2 (two) times  daily. .  fluticasone (FLONASE) 50 MCG/ACT nasal spray, 1 spray each nostril twice daily .  fluticasone furoate-vilanterol (BREO ELLIPTA) 100-25 MCG/INH AEPB, INHALE 1 PUFF INTO THE LUNGS DAILY .  fluticasone furoate-vilanterol (BREO ELLIPTA) 100-25 MCG/INH AEPB, Inhale 1 puff into the lungs daily. .  montelukast (SINGULAIR) 10 MG tablet, Take 1 tablet (10 mg total) by mouth at bedtime.  Current Outpatient Medications (Analgesics):  .  acetaminophen (TYLENOL) 500 MG tablet, Take 500 mg by mouth 2 (two) times daily. Marland Kitchen  aspirin EC 81 MG tablet, Take 81 mg by mouth daily.   Current Outpatient Medications (Other):  .  cholecalciferol (VITAMIN D3) 25 MCG (1000 UT) tablet, Take 1,000 Units by mouth daily. Marland Kitchen  donepezil (ARICEPT) 10 MG tablet, Take 1 tablet (10 mg total) by mouth daily. .  Eyelid Cleansers (OCUSOFT EYELID CLEANSING) PADS, Place 1 application into both eyes 2 (two) times daily. .  fish oil-omega-3 fatty acids 1000 MG capsule, Take 2 g by mouth 2 (two) times daily.  Marland Kitchen  gabapentin (NEURONTIN) 100 MG capsule, Take 2 capsules (200 mg total) by mouth at bedtime. .  gabapentin (NEURONTIN) 300 MG capsule, TAKE 1 CAPSULE(300 MG) BY MOUTH THREE TIMES DAILY .   ofloxacin (OCUFLOX) 0.3 % ophthalmic solution, INSTILL 1 DROP IN LEFT EYE FOUR TIMES DAILY FOR 7 DAYS BEGINNING AFTER SURGERY .  Polyvinyl Alcohol-Povidone (REFRESH OP), Place 1 drop into both eyes 2 (two) times daily. .  Saw Palmetto 500 MG CAPS, Take 1,000 mg by mouth 2 (two) times daily.    Reviewed prior external information including notes and imaging from  primary care provider As well as notes that were available from care everywhere and other healthcare systems.  Past medical history, social, surgical and family history all reviewed in electronic medical record.  No pertanent information unless stated regarding to the chief complaint.   Review of Systems:  No headache, visual changes, nausea, vomiting, diarrhea, constipation, dizziness, abdominal pain, skin rash, fevers, chills, night sweats, weight loss, swollen lymph nodes, body aches, joint swelling, chest pain, shortness of breath, mood changes. POSITIVE muscle aches  Objective  Blood pressure 140/70, pulse 63, height 5\' 6"  (1.676 m), weight 144 lb 6.4 oz (65.5 kg), SpO2 94 %.   General: No apparent distress alert and oriented x3 mood and affect normal, dressed appropriately.  HEENT: Pupils equal, extraocular movements intact  Respiratory: Patient's speak in full sentences and does not appear short of breath  Cardiovascular: No lower extremity edema, non tender, no erythema  Gait normal with good balance and coordination.  MSK: Patient does have arthritic changes noted.  When going from sitting to standing position patient points to more of the superior gluteal muscle approximately 3 cm to the right of midline but no true mass appreciated in this area.  No pain with palpation.  Patient states once he is straight up he is doing fine.  Patient does have significant loss of lordosis.  Has only 5 degrees of extension.  Poor core strength noted.  Neurovascularly intact distally.  Mild tightness with straight leg test bilaterally     Impression and Recommendations:     The above documentation has been reviewed and is accurate and complete , DO

## 2021-01-10 ENCOUNTER — Encounter: Payer: Self-pay | Admitting: Family Medicine

## 2021-01-10 ENCOUNTER — Ambulatory Visit: Payer: Medicare Other | Admitting: Family Medicine

## 2021-01-10 ENCOUNTER — Other Ambulatory Visit: Payer: Self-pay

## 2021-01-10 DIAGNOSIS — M48062 Spinal stenosis, lumbar region with neurogenic claudication: Secondary | ICD-10-CM | POA: Diagnosis not present

## 2021-01-10 NOTE — Patient Instructions (Addendum)
Good to see you Check if there is anything we can do but I think we are doing the right course of action Keep being active Careful getting out of car See me again in 3 months

## 2021-01-10 NOTE — Assessment & Plan Note (Signed)
Patient has done relatively well overall.  Has responded again to the epidurals.  Patient does still has pain and points more over the gluteal been truly over the insertion at the ischial area.  Patient does have the calcific changes or ossification center at the inferior aspect of the ischial tuberosity but too low for patient's symptoms.  We discussed potential injections into the area where patient is having it but unfortunately do not know if it would make a significant improvement.  Patient wants to hold on that at this point.  We discussed potential referral to orthopedic surgery to discuss other treatment options which patient also declined.  Still states that the pain is only when he changes from a sitting position to a standing position.  Other than that has no significant pain.  We also discussed other imaging would include potentially an MRI of the pelvis area which patient also declines.  At this point no change in management and follow-up again in 3 months

## 2021-01-24 ENCOUNTER — Ambulatory Visit: Payer: Medicare Other

## 2021-01-24 ENCOUNTER — Ambulatory Visit (HOSPITAL_COMMUNITY): Admission: RE | Admit: 2021-01-24 | Payer: Medicare Other | Source: Ambulatory Visit

## 2021-02-06 ENCOUNTER — Other Ambulatory Visit: Payer: Self-pay

## 2021-02-06 ENCOUNTER — Ambulatory Visit: Payer: Medicare Other | Admitting: Physician Assistant

## 2021-02-06 ENCOUNTER — Ambulatory Visit (HOSPITAL_COMMUNITY)
Admission: RE | Admit: 2021-02-06 | Discharge: 2021-02-06 | Disposition: A | Discharge status: Home / Self Care | Payer: Medicare Other | Source: Ambulatory Visit | Attending: Vascular Surgery | Admitting: Vascular Surgery

## 2021-02-06 VITALS — BP 121/68 | HR 48 | Temp 97.9°F | Resp 20 | Ht 66.0 in | Wt 142.9 lb

## 2021-02-06 DIAGNOSIS — I6523 Occlusion and stenosis of bilateral carotid arteries: Secondary | ICD-10-CM | POA: Insufficient documentation

## 2021-02-06 NOTE — Progress Notes (Signed)
HISTORY AND PHYSICAL     CC:  follow up. Requesting Provider:  Sheliah Hatch, MD  HPI: This is a 86 y.o. male here for follow up for carotid artery stenosis. He is being followed for bilateral ICA stenosis.  He has documented previous vein bypass graft/aorto-fem pop surgery in 2007 but pt does not recall this and he has no abdominal surgical scar other than right mid abdomen and this is why he got ABI's at the last visit and they were normal.   He denies any hx of having any surgery on his legs.   Pt was last seen 01/23/2020 and at that time he was doing well without stroke sx. He had right cataract surgery without issues.  Earlier in the year, he had cataract surgery on the left eye earlier last year with complications and had to be referred to the [facility] where he underwent further left eye surgery and was since doing well.  He had also seen his PCP and was having cognitive impairments and was started on Aricept.  His wife had concerns about his memory.  Pt returns today for follow up.   Pt denies any amaurosis fugax, speech difficulties, weakness, numbness, paralysis or clumsiness or facial droop.  He does not have any claudication or non healing wounds.   He has hx of laparotomy in December 2019 by Dr. Dwain Sarna for pneumoperitoneum.  He has hx of HTN, HLD, and CAD with hx of PCI in 2005 of RCA and is followed by Dr. Mayford Knife.     The pt is on a statin for cholesterol management.  The pt is on a daily aspirin.   Other AC:  none The pt is on CCB, BB for hypertension.   The pt is not diabetic.   Tobacco hx:  former  Pt does not have family hx of AAA.  Past Medical History:  Diagnosis Date  . Allergy   . Arthritis    "scattered" (10/26/2018)  . Asthma   . C. difficile diarrhea   . CAD (coronary artery disease) 2005   s/p PCI of RCA  . Carotid artery stenosis    1-39% bilateral by dopplers 10/2016  . Carotid stenosis    "? side; Dr. Edilia Bo"  . Cataract    NS OD   . Cognitive impairment, mild, so stated   . Colitis   . Erectile dysfunction   . GERD (gastroesophageal reflux disease)   . Hyperlipidemia   . Hypertension   . Perforated stomach, acute 10/23/2018  . Peripheral vascular disease (HCC)   . Retinal detachment   . Vertebrobasilar artery syndrome   . Vertigo     Past Surgical History:  Procedure Laterality Date  . APPENDECTOMY  1954  . CATARACT EXTRACTION Left 08/08/2019   Dr. Hazle Quant  . COLONOSCOPY    . ESOPHAGOGASTRODUODENOSCOPY (EGD) WITH ESOPHAGEAL DILATION  09/2018  . EYE SURGERY    . LAPAROTOMY N/A 10/23/2018   Procedure: EXPLORATORY LAPAROTOMY WITH GRAHAM PATCH FOR PERFORATED ULCER AND PLACEMENT OF GASTRIC TUBE;  Surgeon: Emelia Loron, MD;  Location: [facility] OR;  Service: General;  Laterality: N/A;  . NASAL SINUS SURGERY  05/2018  . RETINAL DETACHMENT SURGERY     "? side"  . UPPER GASTROINTESTINAL ENDOSCOPY      Allergies  Allergen Reactions  . Neosporin [Bacitracin-Polymyxin B] Other (See Comments)    Redness    Current Outpatient Medications  Medication Sig Dispense Refill  . acetaminophen (TYLENOL) 500 MG tablet Take 500  mg by mouth 2 (two) times daily.    Marland Kitchen albuterol (PROVENTIL) (2.5 MG/3ML) 0.083% nebulizer solution Take 3 mLs (2.5 mg total) by nebulization every 6 (six) hours as needed for wheezing or shortness of breath. 90 vial 3  . albuterol (VENTOLIN HFA) 108 (90 Base) MCG/ACT inhaler INHALE 2 PUFFS INTO LUNGS EVERY 6 HOURS AS NEEDED FOR WHEEZING OR SHORTNESS OF BREATH. 8.5 g 3  . amLODipine (NORVASC) 5 MG tablet Take 1 tablet (5 mg total) by mouth daily. 90 tablet 3  . aspirin EC 81 MG tablet Take 81 mg by mouth daily.    Marland Kitchen atorvastatin (LIPITOR) 80 MG tablet Take 1 tablet (80 mg total) by mouth daily. 90 tablet 3  . azelastine (ASTELIN) 0.1 % nasal spray Place 1 spray into both nostrils 2 (two) times daily. 30 mL 2  . carvedilol (COREG) 6.25 MG tablet Take 1 tablet (6.25 mg total) by mouth 2 (two) times  daily with a meal. 180 tablet 3  . cholecalciferol (VITAMIN D3) 25 MCG (1000 UT) tablet Take 1,000 Units by mouth daily.    Marland Kitchen donepezil (ARICEPT) 10 MG tablet Take 1 tablet (10 mg total) by mouth daily. 90 tablet 1  . Eyelid Cleansers (OCUSOFT EYELID CLEANSING) PADS Place 1 application into both eyes 2 (two) times daily.    . fish oil-omega-3 fatty acids 1000 MG capsule Take 2 g by mouth 2 (two) times daily.     . fluticasone (FLONASE) 50 MCG/ACT nasal spray 1 spray each nostril twice daily 16 g 5  . fluticasone furoate-vilanterol (BREO ELLIPTA) 100-25 MCG/INH AEPB INHALE 1 PUFF INTO THE LUNGS DAILY 180 each 3  . fluticasone furoate-vilanterol (BREO ELLIPTA) 100-25 MCG/INH AEPB Inhale 1 puff into the lungs daily. 1 each 0  . gabapentin (NEURONTIN) 100 MG capsule Take 2 capsules (200 mg total) by mouth at bedtime. 180 capsule 1  . gabapentin (NEURONTIN) 300 MG capsule TAKE 1 CAPSULE(300 MG) BY MOUTH THREE TIMES DAILY 90 capsule 3  . hydrochlorothiazide (HYDRODIURIL) 25 MG tablet Take 1 tablet (25 mg total) by mouth daily. 90 tablet 3  . montelukast (SINGULAIR) 10 MG tablet Take 1 tablet (10 mg total) by mouth at bedtime. 30 tablet 11  . ofloxacin (OCUFLOX) 0.3 % ophthalmic solution INSTILL 1 DROP IN LEFT EYE FOUR TIMES DAILY FOR 7 DAYS BEGINNING AFTER SURGERY    . Polyvinyl Alcohol-Povidone (REFRESH OP) Place 1 drop into both eyes 2 (two) times daily.    . Saw Palmetto 500 MG CAPS Take 1,000 mg by mouth 2 (two) times daily.      No current facility-administered medications for this visit.    Family History  Problem Relation Age of Onset  . Stroke Mother   . Hyperlipidemia Mother   . Hypertension Mother   . Deep vein thrombosis Mother   . Stroke Father   . Hyperlipidemia Father   . Hypertension Father   . Deep vein thrombosis Father   . Diverticulitis Father   . Ulcerative colitis Father   . Colon polyps Father   . Colon cancer Neg Hx   . Esophageal cancer Neg Hx   . Stomach cancer  Neg Hx   . Rectal cancer Neg Hx     Social History   Socioeconomic History  . Marital status: Married    Spouse name: Not on file  . Number of children: 3  . Years of education: Not on file  . Highest education level: Not on file  Occupational History  .  Occupation: Retired Teaching laboratory technician: RETIRED  Tobacco Use  . Smoking status: Former Smoker    Years: 2.00    Types: Cigars    Quit date: 05/13/1961    Years since quitting: 59.7  . Smokeless tobacco: Never Used  . Tobacco comment: "never inhaled" in the early 60's   Vaping Use  . Vaping Use: Never used  Substance and Sexual Activity  . Alcohol use: Yes    Alcohol/week: 14.0 standard drinks    Types: 14 Glasses of wine per week  . Drug use: Never  . Sexual activity: Not Currently  Other Topics Concern  . Not on file  Social History Narrative   Married with 1 son and 2 daughters.  Retired.   2 caffeinated beverages daily, 3 glasses of wine daily   Never smoker no drug use no tobacco   Social Determinants of Corporate investment banker Strain: Not on file  Food Insecurity: Not on file  Transportation Needs: Not on file  Physical Activity: Not on file  Stress: Not on file  Social Connections: Not on file  Intimate Partner Violence: Not on file     REVIEW OF SYSTEMS:   [X]  denotes positive finding, [ ]  denotes negative finding Cardiac  Comments:  Chest pain or chest pressure:    Shortness of breath upon exertion:    Short of breath when lying flat:    Irregular heart rhythm:        Vascular    Pain in calf, thigh, or hip brought on by ambulation:    Pain in feet at night that wakes you up from your sleep:     Blood clot in your veins:    Leg swelling:         Pulmonary    Oxygen at home:    Productive cough:     Wheezing:         Neurologic    Sudden weakness in arms or legs:     Sudden numbness in arms or legs:     Sudden onset of difficulty speaking or slurred speech:     Temporary loss of vision in one eye:     Problems with dizziness:         Gastrointestinal    Blood in stool:     Vomited blood:         Genitourinary    Burning when urinating:     Blood in urine:        Psychiatric    Major depression:         Hematologic    Bleeding problems:    Problems with blood clotting too easily:        Skin    Rashes or ulcers:        Constitutional    Fever or chills:      PHYSICAL EXAMINATION:  Today's Vitals   02/06/21 0958 02/06/21 1000  BP: (!) 108/58 121/68  Pulse: (!) 48   Resp: 20   Temp: 97.9 F (36.6 C)   TempSrc: Temporal   SpO2: 97%   Weight: 142 lb 14.4 oz (64.8 kg)   Height: 5\' 6"  (1.676 m)    Body mass index is 23.06 kg/m.   General:  WDWN in NAD; vital signs documented above Gait: Not observed HENT: WNL, normocephalic Pulmonary: normal non-labored breathing Cardiac: regular HR, without carotid bruits Abdomen: soft, NT; aortic pulse is not palpable Skin: without rashes Vascular Exam/Pulses:  Right Left  Radial 2+ (normal) 2+ (normal)  Ulnar 2+ (normal) 2+ (normal)  Popliteal Unable to palpate 1+ (weak)  DP Unable to palpate Unable to palpate  PT 2+ (normal) 2+ (normal)   Extremities: without ischemic changes, without Gangrene , without cellulitis; without open wounds Musculoskeletal: no muscle wasting or atrophy  Neurologic: A&O X 3; moving all extremities equally; speech is fluent/normal Psychiatric:  The pt has Normal affect.   Non-Invasive Vascular Imaging:   Carotid Duplex on 02/06/2021: Right:  1-39% ICA stenosis Left:  40-59% ICA stenosis Vertebrals: Bilateral vertebral arteries demonstrate antegrade flow.  Subclavians: Normal flow hemodynamics were seen in bilateral subclavian arteries.  Previous Carotid duplex on 01/22/2021: Right: 1-39% ICA stenosis Left:   40-59% ICA stenosis    ASSESSMENT/PLAN:: 86 y.o. male here for follow up carotid artery stenosis  -duplex today reveals it is  essentially unchanged with 1-39% ICA stenosis on the right and 40-59% ICA stenosis on the left.   -discussed s/s of stroke with pt and he understands should he develop any of these sx, he will go to the nearest ER or call 911. -also discussed with him that we would not intervene on his carotid stenosis unless it progresses to greater than 80% or he becomes symptomatic. -pt will f/u in one year with carotid duplex -pt will call sooner should they have any issues. -continue statin/asa    Doreatha MassedSamantha Kennis Wissmann, [facility] [phone]  Clinic MD:  Edilia Boickson

## 2021-02-15 ENCOUNTER — Telehealth: Payer: Self-pay | Admitting: Family Medicine

## 2021-02-15 NOTE — Telephone Encounter (Signed)
Left message for patient to schedule Annual Wellness Visit.  Please schedule with Nurse Health Advisor Martha Stanley, RN at [place]  

## 2021-03-07 NOTE — Progress Notes (Signed)
Subjective:   [name] is a 86 y.o. male who presents for Medicare Annual/Subsequent preventive examination.  I connected with Boyde today by telephone and verified that I am speaking with the correct person using two identifiers. Location patient: home Location provider: work Persons participating in the virtual visit: patient, Engineer, civil (consulting).    I discussed the limitations, risks, security and privacy concerns of performing an evaluation and management service by telephone and the availability of in person appointments. I also discussed with the patient that there may be a patient responsible charge related to this service. The patient expressed understanding and verbally consented to this telephonic visit.    Interactive audio and video telecommunications were attempted between this provider and patient, however failed, due to patient having technical difficulties OR patient did not have access to video capability.  We continued and completed visit with audio only.  Some vital signs may be absent or patient reported.   Time Spent with patient on telephone encounter: 30 minutes   Review of Systems     Cardiac Risk Factors include: advanced age (>34men, >44 women);male gender;dyslipidemia;hypertension;sedentary lifestyle     Objective:    Today's Vitals   03/11/21 0817  Weight: 142 lb (64.4 kg)  Height: 5\' 6"  (1.676 m)   Body mass index is 22.92 kg/m.  Advanced Directives 03/11/2021 03/07/2020 12/21/2018 10/26/2018 10/23/2018 10/04/2018 09/13/2014  Does Patient Have a Medical Advance Directive? Yes Yes Yes Yes Yes Yes Yes  Type of Advance Directive Living will Living will;Healthcare Power of 09/15/2014 Power of [place];Living will Living will Healthcare Power of [place];Living will Living will;Healthcare Power of Attorney Living will;Healthcare Power of Attorney  Does patient want to make changes to medical advance directive? - No - Patient declined - No - Patient declined - -  No - Patient declined  Copy of Healthcare Power of Attorney in Chart? - No - copy requested - - - - No - copy requested    Current Medications (verified) Outpatient Encounter Medications as of 03/11/2021  Medication Sig  . acetaminophen (TYLENOL) 500 MG tablet Take 500 mg by mouth 2 (two) times daily.  03/13/2021 albuterol (PROVENTIL) (2.5 MG/3ML) 0.083% nebulizer solution Take 3 mLs (2.5 mg total) by nebulization every 6 (six) hours as needed for wheezing or shortness of breath.  Marland Kitchen albuterol (VENTOLIN HFA) 108 (90 Base) MCG/ACT inhaler INHALE 2 PUFFS INTO LUNGS EVERY 6 HOURS AS NEEDED FOR WHEEZING OR SHORTNESS OF BREATH.  Marland Kitchen amLODipine (NORVASC) 5 MG tablet Take 1 tablet (5 mg total) by mouth daily.  Marland Kitchen aspirin EC 81 MG tablet Take 81 mg by mouth daily.  Marland Kitchen atorvastatin (LIPITOR) 80 MG tablet Take 1 tablet (80 mg total) by mouth daily.  Marland Kitchen azelastine (ASTELIN) 0.1 % nasal spray Place 1 spray into both nostrils 2 (two) times daily.  . carvedilol (COREG) 6.25 MG tablet Take 1 tablet (6.25 mg total) by mouth 2 (two) times daily with a meal.  . cholecalciferol (VITAMIN D3) 25 MCG (1000 UT) tablet Take 1,000 Units by mouth daily.  Marland Kitchen donepezil (ARICEPT) 10 MG tablet Take 1 tablet (10 mg total) by mouth daily.  . Eyelid Cleansers (OCUSOFT EYELID CLEANSING) PADS Place 1 application into both eyes 2 (two) times daily.  . fish oil-omega-3 fatty acids 1000 MG capsule Take 2 g by mouth 2 (two) times daily.   . fluticasone (FLONASE) 50 MCG/ACT nasal spray 1 spray each nostril twice daily  . fluticasone furoate-vilanterol (BREO ELLIPTA) 100-25 MCG/INH AEPB INHALE 1  PUFF INTO THE LUNGS DAILY  . fluticasone furoate-vilanterol (BREO ELLIPTA) 100-25 MCG/INH AEPB Inhale 1 puff into the lungs daily.  Marland Kitchen gabapentin (NEURONTIN) 100 MG capsule Take 2 capsules (200 mg total) by mouth at bedtime.  . gabapentin (NEURONTIN) 300 MG capsule TAKE 1 CAPSULE(300 MG) BY MOUTH THREE TIMES DAILY  . hydrochlorothiazide (HYDRODIURIL) 25 MG  tablet Take 1 tablet (25 mg total) by mouth daily.  . montelukast (SINGULAIR) 10 MG tablet Take 1 tablet (10 mg total) by mouth at bedtime.  Marland Kitchen ofloxacin (OCUFLOX) 0.3 % ophthalmic solution INSTILL 1 DROP IN LEFT EYE FOUR TIMES DAILY FOR 7 DAYS BEGINNING AFTER SURGERY  . Polyvinyl Alcohol-Povidone (REFRESH OP) Place 1 drop into both eyes 2 (two) times daily.  . Saw Palmetto 500 MG CAPS Take 1,000 mg by mouth 2 (two) times daily.    No facility-administered encounter medications on file as of 03/11/2021.    Allergies (verified) Neosporin [bacitracin-polymyxin b]   History: Past Medical History:  Diagnosis Date  . Allergy   . Arthritis    "scattered" (10/26/2018)  . Asthma   . C. difficile diarrhea   . CAD (coronary artery disease) 2005   s/p PCI of RCA  . Carotid artery stenosis    1-39% bilateral by dopplers 10/2016  . Carotid stenosis    "? side; Dr. Edilia Bo"  . Cataract    NS OD  . Cognitive impairment, mild, so stated   . Colitis   . Erectile dysfunction   . GERD (gastroesophageal reflux disease)   . Hyperlipidemia   . Hypertension   . Perforated stomach, acute 10/23/2018  . Peripheral vascular disease (HCC)   . Retinal detachment   . Vertebrobasilar artery syndrome   . Vertigo    Past Surgical History:  Procedure Laterality Date  . APPENDECTOMY  1954  . CATARACT EXTRACTION Left 08/08/2019   Dr. Hazle Quant  . COLONOSCOPY    . ESOPHAGOGASTRODUODENOSCOPY (EGD) WITH ESOPHAGEAL DILATION  09/2018  . EYE SURGERY    . LAPAROTOMY N/A 10/23/2018   Procedure: EXPLORATORY LAPAROTOMY WITH GRAHAM PATCH FOR PERFORATED ULCER AND PLACEMENT OF GASTRIC TUBE;  Surgeon: Emelia Loron, MD;  Location: [facility] OR;  Service: General;  Laterality: N/A;  . NASAL SINUS SURGERY  05/2018  . RETINAL DETACHMENT SURGERY     "? side"  . UPPER GASTROINTESTINAL ENDOSCOPY     Family History  Problem Relation Age of Onset  . Stroke Mother   . Hyperlipidemia Mother   . Hypertension Mother   . Deep  vein thrombosis Mother   . Stroke Father   . Hyperlipidemia Father   . Hypertension Father   . Deep vein thrombosis Father   . Diverticulitis Father   . Ulcerative colitis Father   . Colon polyps Father   . Colon cancer Neg Hx   . Esophageal cancer Neg Hx   . Stomach cancer Neg Hx   . Rectal cancer Neg Hx    Social History   Socioeconomic History  . Marital status: Married    Spouse name: Not on file  . Number of children: 3  . Years of education: Not on file  . Highest education level: Not on file  Occupational History  . Occupation: Retired Teaching laboratory technician: RETIRED  Tobacco Use  . Smoking status: Former Smoker    Years: 2.00    Types: Cigars    Quit date: 05/13/1961    Years since quitting: 59.8  . Smokeless tobacco: Never Used  .  Tobacco comment: "never inhaled" in the early 60's   Vaping Use  . Vaping Use: Never used  Substance and Sexual Activity  . Alcohol use: Yes    Alcohol/week: 14.0 standard drinks    Types: 14 Glasses of wine per week  . Drug use: Never  . Sexual activity: Not Currently  Other Topics Concern  . Not on file  Social History Narrative   Married with 1 son and 2 daughters.  Retired.   2 caffeinated beverages daily, 3 glasses of wine daily   Never smoker no drug use no tobacco   Social Determinants of Health   Financial Resource Strain: Low Risk   . Difficulty of Paying Living Expenses: Not hard at all  Food Insecurity: No Food Insecurity  . Worried About Programme researcher, broadcasting/film/video in the Last Year: Never true  . Ran Out of Food in the Last Year: Never true  Transportation Needs: No Transportation Needs  . Lack of Transportation (Medical): No  . Lack of Transportation (Non-Medical): No  Physical Activity: Inactive  . Days of Exercise per Week: 0 days  . Minutes of Exercise per Session: 0 min  Stress: No Stress Concern Present  . Feeling of Stress : Not at all  Social Connections: Moderately Integrated  . Frequency of  Communication with Friends and Family: More than three times a week  . Frequency of Social Gatherings with Friends and Family: More than three times a week  . Attends Religious Services: Never  . Active Member of Clubs or Organizations: Yes  . Attends Banker Meetings: More than 4 times per year  . Marital Status: Married    Tobacco Counseling Counseling given: Not Answered Comment: "never inhaled" in the early 60's    Clinical Intake:  Pre-visit preparation completed: Yes  Pain : No/denies pain     Nutritional Status: BMI of 19-24  Normal Nutritional Risks: None Diabetes: No  How often do you need to have someone help you when you read instructions, pamphlets, or other written materials from your doctor or pharmacy?: 1 - Never  Diabetic?No  Interpreter Needed?: No  Information entered by :: Thomasenia Sales LPN   Activities of Daily Living In your present state of health, do you have any difficulty performing the following activities: 03/11/2021 11/02/2020  Hearing? N N  Vision? N N  Difficulty concentrating or making decisions? Y N  Comment per wife -  Walking or climbing stairs? N N  Dressing or bathing? N N  Doing errands, shopping? N N  Preparing Food and eating ? N -  Using the Toilet? N -  In the past six months, have you accidently leaked urine? N -  Do you have problems with loss of bowel control? N -  Managing your Medications? N -  Managing your Finances? N -  Housekeeping or managing your Housekeeping? N -  Some recent data might be hidden    Patient Care Team: Sheliah Hatch, MD as PCP - General Quintella Reichert, MD as PCP - Cardiology (Cardiology) Quintella Reichert, MD as Consulting Physician (Cardiology) Chuck Hint, MD as Consulting Physician (Vascular Surgery) Jerilee Field, MD as Consulting Physician (Urology) Iva Boop, MD as Consulting Physician (Gastroenterology) Josephine Igo, DO as Consulting  Physician (Pulmonary Disease) Jamal Collin, MD as Consulting Physician (Ophthalmology)  Indicate any recent Medical Services you may have received from other than [facility] providers in the past year (date may be approximate).  Assessment:   This is a routine wellness examination for Ree KidaJack.  Hearing/Vision screen  Hearing Screening   125Hz  250Hz  500Hz  1000Hz  2000Hz  3000Hz  4000Hz  6000Hz  8000Hz   Right ear:           Left ear:           Comments: C/o hearing loss in right ear  Vision Screening Comments: Reading glasses Last eye exam-6 months ago-Dr. Annice Pihoop  Dietary issues and exercise activities discussed: Current Exercise Habits: The patient does not participate in regular exercise at present, Exercise limited by: None identified  Goals Addressed            This Visit's Progress   . Patient Stated       Maintain current health      Depression Screen PHQ 2/9 Scores 03/11/2021 11/02/2020 05/07/2020 03/07/2020 11/11/2019 05/04/2019 10/11/2018  PHQ - 2 Score 0 0 0 0 0 0 0  PHQ- 9 Score - 0 0 - 0 0 0  Exception Documentation - - - - - - -    Fall Risk Fall Risk  03/11/2021 11/02/2020 05/07/2020 05/07/2020 03/07/2020  Falls in the past year? 0 0 1 1 0  Number falls in past yr: 0 0 0 1 0  Injury with Fall? 0 0 1 1 0  Comment - - - - -  Risk for fall due to : - No Fall Risks - - -  Follow up Falls prevention discussed - Falls evaluation completed Falls evaluation completed Falls evaluation completed;Education provided;Falls prevention discussed    FALL RISK PREVENTION PERTAINING TO THE HOME:  Any stairs in or around the home? Yes  If so, are there any without handrails? No  Home free of loose throw rugs in walkways, pet beds, electrical cords, etc? Yes  Adequate lighting in your home to reduce risk of falls? Yes   ASSISTIVE DEVICES UTILIZED TO PREVENT FALLS:  Life alert? No  Use of a cane, walker or w/c? No  Grab bars in the bathroom? Yes  Shower chair or bench in shower?  No  Elevated toilet seat or a handicapped toilet? No   TIMED UP AND GO:  Was the test performed? No . Phone visit   Cognitive Function:Patient states he has been told that he has mild cognitive impairment.     6CIT Screen 03/11/2021 03/07/2020  What Year? 0 points 0 points  What month? 0 points 0 points  What time? 0 points 0 points  Count back from 20 0 points 0 points  Months in reverse 0 points 0 points  Repeat phrase 0 points 2 points  Total Score 0 2    Immunizations Immunization History  Administered Date(s) Administered  . Fluad Quad(high Dose 65+) 06/24/2019  . Influenza Split 08/25/2011, 08/09/2012  . Influenza Whole 07/11/2010  . Influenza, High Dose Seasonal PF 08/14/2014, 07/16/2018  . Influenza,inj,Quad PF,6+ Mos 08/11/2013, 08/18/2014, 07/11/2015, 07/14/2016, 07/20/2017  . Influenza-Unspecified 07/24/2000  . PFIZER(Purple Top)SARS-COV-2 Vaccination 11/15/2019, 12/06/2019, 07/27/2020, 03/08/2021  . Pneumococcal Conjugate-13 01/25/2015  . Pneumococcal Polysaccharide-23 09/15/2002  . Td 12/13/2007, 05/31/2019  . Zoster 10/29/2010    TDAP status: Up to date  Flu Vaccine status: Up to date  Pneumococcal vaccine status: Up to date  Covid-19 vaccine status: Completed vaccines  Qualifies for Shingles Vaccine? Yes   Zostavax completed Yes   Shingrix Completed?: Yes  Per patient  Screening Tests Health Maintenance  Topic Date Due  . INFLUENZA VACCINE  05/27/2021  . TETANUS/TDAP  05/30/2029  . COVID-19  Vaccine  Completed  . PNA vac Low Risk Adult  Completed  . HPV VACCINES  Aged Out    Health Maintenance  There are no preventive care reminders to display for this patient.  Colorectal cancer screening: No longer required.   Lung Cancer Screening: (Low Dose CT Chest recommended if Age 34-80 years, 30 pack-year currently smoking OR have quit w/in 15years.) does not qualify.     Additional Screening:  Hepatitis C Screening: does not  qualify  Vision Screening: Recommended annual ophthalmology exams for early detection of glaucoma and other disorders of the eye. Is the patient up to date with their annual eye exam?  Yes  Who is the provider or what is the name of the office in which the patient attends annual eye exams? Dr. Annice Pih   Dental Screening: Recommended annual dental exams for proper oral hygiene  Community Resource Referral / Chronic Care Management: CRR required this visit?  No   CCM required this visit?  No      Plan:     I have personally reviewed and noted the following in the patient's chart:   . Medical and social history . Use of alcohol, tobacco or illicit drugs  . Current medications and supplements including opioid prescriptions. Patient is not currently taking opioid prescriptions. . Functional ability and status . Nutritional status . Physical activity . Advanced directives . List of other physicians . Hospitalizations, surgeries, and ER visits in previous 12 months . Vitals . Screenings to include cognitive, depression, and falls . Referrals and appointments  In addition, I have reviewed and discussed with patient certain preventive protocols, quality metrics, and best practice recommendations. A written personalized care plan for preventive services as well as general preventive health recommendations were provided to patient.   Due to this being a telephonic visit, the after visit summary with patients personalized plan was offered to patient via mail or my-chart.  Patient would like to access on my-chart.   Roanna Raider, LPN   06/[birth year]36  Nurse Health Advisor  Nurse Notes: None

## 2021-03-11 ENCOUNTER — Ambulatory Visit (INDEPENDENT_AMBULATORY_CARE_PROVIDER_SITE_OTHER): Payer: Medicare Other

## 2021-03-11 ENCOUNTER — Other Ambulatory Visit: Payer: Self-pay

## 2021-03-11 ENCOUNTER — Encounter: Payer: Self-pay | Admitting: Family Medicine

## 2021-03-11 ENCOUNTER — Ambulatory Visit: Payer: Medicare Other | Admitting: Family Medicine

## 2021-03-11 VITALS — Ht 66.0 in | Wt 142.0 lb

## 2021-03-11 VITALS — BP 120/70 | HR 63 | Temp 98.0°F | Resp 16 | Ht 66.0 in | Wt 145.8 lb

## 2021-03-11 DIAGNOSIS — L03116 Cellulitis of left lower limb: Secondary | ICD-10-CM | POA: Diagnosis not present

## 2021-03-11 DIAGNOSIS — Z0000 Encounter for general adult medical examination without abnormal findings: Secondary | ICD-10-CM | POA: Diagnosis not present

## 2021-03-11 MED ORDER — CEPHALEXIN 500 MG PO CAPS: 500.0000 mg | ORAL_CAPSULE | Freq: Three times a day (TID) | ORAL | 0 refills | Status: AC

## 2021-03-11 NOTE — Patient Instructions (Signed)
[name] , Thank you for taking time to complete your Medicare Wellness Visit. I appreciate your ongoing commitment to your health goals. Please review the following plan we discussed and let me know if I can assist you in the future.   Screening recommendations/referrals: Colonoscopy: No longer required Recommended yearly ophthalmology/optometry visit for glaucoma screening and checkup Recommended yearly dental visit for hygiene and checkup  Vaccinations: Influenza vaccine: Up to date Pneumococcal vaccine: Completed vaccines Tdap vaccine: Up to date-Due-05/30/2029 Shingles vaccine: Completed vaccines-specific dates not available Covid-19: Up to date  Advanced directives:   Conditions/risks identified: Copy in chart  Next appointment: Follow up in one year for your annual wellness visit.   Preventive Care 9 Years and Older, Male Preventive care refers to lifestyle choices and visits with your health care provider that can promote health and wellness. What does preventive care include?  A yearly physical exam. This is also called an annual well check.  Dental exams once or twice a year.  Routine eye exams. Ask your health care provider how often you should have your eyes checked.  Personal lifestyle choices, including:  Daily care of your teeth and gums.  Regular physical activity.  Eating a healthy diet.  Avoiding tobacco and drug use.  Limiting alcohol use.  Practicing safe sex.  Taking low doses of aspirin every day.  Taking vitamin and mineral supplements as recommended by your health care provider. What happens during an annual well check? The services and screenings done by your health care provider during your annual well check will depend on your age, overall health, lifestyle risk factors, and family history of disease. Counseling  Your health care provider may ask you questions about your:  Alcohol use.  Tobacco use.  Drug use.  Emotional  well-being.  Home and relationship well-being.  Sexual activity.  Eating habits.  History of falls.  Memory and ability to understand (cognition).  Work and work Astronomer. Screening  You may have the following tests or measurements:  Height, weight, and BMI.  Blood pressure.  Lipid and cholesterol levels. These may be checked every 5 years, or more frequently if you are over 47 years old.  Skin check.  Lung cancer screening. You may have this screening every year starting at age 63 if you have a 30-pack-year history of smoking and currently smoke or have quit within the past 15 years.  Fecal occult blood test (FOBT) of the stool. You may have this test every year starting at age 73.  Flexible sigmoidoscopy or colonoscopy. You may have a sigmoidoscopy every 5 years or a colonoscopy every 10 years starting at age 83.  Prostate cancer screening. Recommendations will vary depending on your family history and other risks.  Hepatitis C blood test.  Hepatitis B blood test.  Sexually transmitted disease (STD) testing.  Diabetes screening. This is done by checking your blood sugar (glucose) after you have not eaten for a while (fasting). You may have this done every 1-3 years.  Abdominal aortic aneurysm (AAA) screening. You may need this if you are a current or former smoker.  Osteoporosis. You may be screened starting at age 39 if you are at high risk. Talk with your health care provider about your test results, treatment options, and if necessary, the need for more tests. Vaccines  Your health care provider may recommend certain vaccines, such as:  Influenza vaccine. This is recommended every year.  Tetanus, diphtheria, and acellular pertussis (Tdap, Td) vaccine. You may need  a Td booster every 10 years.  Zoster vaccine. You may need this after age 53.  Pneumococcal 13-valent conjugate (PCV13) vaccine. One dose is recommended after age 68.  Pneumococcal  polysaccharide (PPSV23) vaccine. One dose is recommended after age 20. Talk to your health care provider about which screenings and vaccines you need and how often you need them. This information is not intended to replace advice given to you by your health care provider. Make sure you discuss any questions you have with your health care provider. Document Released: 11/09/2015 Document Revised: 07/02/2016 Document Reviewed: 08/14/2015 Elsevier Interactive Patient Education  2017 [employer] Prevention in the Home Falls can cause injuries. They can happen to people of all ages. There are many things you can do to make your home safe and to help prevent falls. What can I do on the outside of my home?  Regularly fix the edges of walkways and driveways and fix any cracks.  Remove anything that might make you trip as you walk through a door, such as a raised step or threshold.  Trim any bushes or trees on the path to your home.  Use bright outdoor lighting.  Clear any walking paths of anything that might make someone trip, such as rocks or tools.  Regularly check to see if handrails are loose or broken. Make sure that both sides of any steps have handrails.  Any raised decks and porches should have guardrails on the edges.  Have any leaves, snow, or ice cleared regularly.  Use sand or salt on walking paths during winter.  Clean up any spills in your garage right away. This includes oil or grease spills. What can I do in the bathroom?  Use night lights.  Install grab bars by the toilet and in the tub and shower. Do not use towel bars as grab bars.  Use non-skid mats or decals in the tub or shower.  If you need to sit down in the shower, use a plastic, non-slip stool.  Keep the floor dry. Clean up any water that spills on the floor as soon as it happens.  Remove soap buildup in the tub or shower regularly.  Attach bath mats securely with double-sided non-slip rug  tape.  Do not have throw rugs and other things on the floor that can make you trip. What can I do in the bedroom?  Use night lights.  Make sure that you have a light by your bed that is easy to reach.  Do not use any sheets or blankets that are too big for your bed. They should not hang down onto the floor.  Have a firm chair that has side arms. You can use this for support while you get dressed.  Do not have throw rugs and other things on the floor that can make you trip. What can I do in the kitchen?  Clean up any spills right away.  Avoid walking on wet floors.  Keep items that you use a lot in easy-to-reach places.  If you need to reach something above you, use a strong step stool that has a grab bar.  Keep electrical cords out of the way.  Do not use floor polish or wax that makes floors slippery. If you must use wax, use non-skid floor wax.  Do not have throw rugs and other things on the floor that can make you trip. What can I do with my stairs?  Do not leave any items on the  stairs.  Make sure that there are handrails on both sides of the stairs and use them. Fix handrails that are broken or loose. Make sure that handrails are as long as the stairways.  Check any carpeting to make sure that it is firmly attached to the stairs. Fix any carpet that is loose or worn.  Avoid having throw rugs at the top or bottom of the stairs. If you do have throw rugs, attach them to the floor with carpet tape.  Make sure that you have a light switch at the top of the stairs and the bottom of the stairs. If you do not have them, ask someone to add them for you. What else can I do to help prevent falls?  Wear shoes that:  Do not have high heels.  Have rubber bottoms.  Are comfortable and fit you well.  Are closed at the toe. Do not wear sandals.  If you use a stepladder:  Make sure that it is fully opened. Do not climb a closed stepladder.  Make sure that both sides of the  stepladder are locked into place.  Ask someone to hold it for you, if possible.  Clearly mark and make sure that you can see:  Any grab bars or handrails.  First and last steps.  Where the edge of each step is.  Use tools that help you move around (mobility aids) if they are needed. These include:  Canes.  Walkers.  Scooters.  Crutches.  Turn on the lights when you go into a dark area. Replace any light bulbs as soon as they burn out.  Set up your furniture so you have a clear path. Avoid moving your furniture around.  If any of your floors are uneven, fix them.  If there are any pets around you, be aware of where they are.  Review your medicines with your doctor. Some medicines can make you feel dizzy. This can increase your chance of falling. Ask your doctor what other things that you can do to help prevent falls. This information is not intended to replace advice given to you by your health care provider. Make sure you discuss any questions you have with your health care provider. Document Released: 08/09/2009 Document Revised: 03/20/2016 Document Reviewed: 11/17/2014 Elsevier Interactive Patient Education  2017 [employer].

## 2021-03-11 NOTE — Progress Notes (Signed)
   Subjective:    Patient ID: [name], male    DOB: Oct 21, 1935, 86 y.o.   MRN: [id number]  HPI L leg wound- pt hit his L lower leg leaving his workshop 5 days ago.  Had a small skin tear but then developed surrounding redness and pain.  No drainage.  Area is warm to the touch.   Review of Systems For ROS see HPI   This visit occurred during the SARS-CoV-2 public health emergency.  Safety protocols were in place, including screening questions prior to the visit, additional usage of staff PPE, and extensive cleaning of exam room while observing appropriate contact time as indicated for disinfecting solutions.       Objective:   Physical Exam Vitals reviewed.  Constitutional:      General: He is not in acute distress.    Appearance: Normal appearance. He is not ill-appearing.  HENT:     Head: Normocephalic and atraumatic.  Cardiovascular:     Pulses: Normal pulses.  Musculoskeletal:     Right lower leg: No edema.     Left lower leg: No edema.  Skin:    General: Skin is warm and dry.     Findings: Erythema (moderately sized ring of erythema surrounding triangular skin tear on L anterior lower leg.  + TTP, warm to touch.  no drainage.  mildly indurated) present.  Neurological:     General: No focal deficit present.     Mental Status: He is alert. Mental status is at baseline.  Psychiatric:        Mood and Affect: Mood normal.        Behavior: Behavior normal.        Thought Content: Thought content normal.           Assessment & Plan:  L lower leg cellulitis- new.  Skin tear has clean base and is healing well but the surrounding induration and erythema are concerning for infxn.  Start Keflex TID.  Reviewed supportive care and red flags that should prompt return.  Pt expressed understanding and is in agreement w/ plan.

## 2021-03-11 NOTE — Patient Instructions (Signed)
Follow up as needed or if things don't improve START the Cephalexin 3x/day for 7 days- take w/ food Clean w/ peroxide and pat dry Do NOT use Neosporin as this can slow healing Call with any questions or concerns Hang in there!!!

## 2021-03-19 DIAGNOSIS — H9011 Conductive hearing loss, unilateral, right ear, with unrestricted hearing on the contralateral side: Secondary | ICD-10-CM | POA: Insufficient documentation

## 2021-03-19 DIAGNOSIS — H6121 Impacted cerumen, right ear: Secondary | ICD-10-CM | POA: Diagnosis not present

## 2021-03-27 ENCOUNTER — Other Ambulatory Visit: Payer: Self-pay | Admitting: Family Medicine

## 2021-04-05 ENCOUNTER — Telehealth: Payer: Self-pay | Admitting: Family Medicine

## 2021-04-05 NOTE — Chronic Care Management (AMB) (Signed)
  Chronic Care Management   Note  04/05/2021 Name: Xadrian Craighead MRN: [id number] DOB: 10/18/1935  Kristy Catoe is a 86 y.o. year old male who is a primary care patient of Beverely Low, Helane Rima, MD. I reached out to Viviana Simpler by phone today in response to a referral sent by Mr. Stanislaw Acton PCP, Sheliah Hatch, MD.   Mr. Ladnier was given information about Chronic Care Management services today including:  CCM service includes personalized support from designated clinical staff supervised by his physician, including individualized plan of care and coordination with other care providers 24/7 contact phone numbers for assistance for urgent and routine care needs. Service will only be billed when office clinical staff spend 20 minutes or more in a month to coordinate care. Only one practitioner may furnish and bill the service in a calendar month. The patient may stop CCM services at any time (effective at the end of the month) by phone call to the office staff.   Patient agreed to services and verbal consent obtained.   Follow up plan:   Carmell [place] [employer] Scheduler

## 2021-04-08 ENCOUNTER — Other Ambulatory Visit (HOSPITAL_COMMUNITY): Payer: Self-pay

## 2021-04-08 ENCOUNTER — Ambulatory Visit: Payer: Medicare Other | Admitting: Primary Care

## 2021-04-08 ENCOUNTER — Other Ambulatory Visit: Payer: Self-pay

## 2021-04-08 ENCOUNTER — Telehealth: Payer: Self-pay | Admitting: Primary Care

## 2021-04-08 ENCOUNTER — Encounter: Payer: Self-pay | Admitting: Primary Care

## 2021-04-08 DIAGNOSIS — J310 Chronic rhinitis: Secondary | ICD-10-CM

## 2021-04-08 DIAGNOSIS — J45909 Unspecified asthma, uncomplicated: Secondary | ICD-10-CM

## 2021-04-08 MED ORDER — BREO ELLIPTA 100-25 MCG/INH IN AEPB: 1.0000 | INHALATION_SPRAY | Freq: Every day | RESPIRATORY_TRACT | 0 refills | Status: DC

## 2021-04-08 MED ORDER — FLUTICASONE PROPIONATE 50 MCG/ACT NA SUSP: NASAL | 11 refills | Status: AC

## 2021-04-08 MED ORDER — MONTELUKAST SODIUM 10 MG PO TABS: 10.0000 mg | ORAL_TABLET | Freq: Every day | ORAL | 11 refills | Status: DC

## 2021-04-08 MED ORDER — AZELASTINE HCL 0.1 % NA SOLN: 1.0000 | Freq: Two times a day (BID) | NASAL | 11 refills | Status: DC

## 2021-04-08 NOTE — Patient Instructions (Addendum)
Recommendations: - Resume (Singulair) 10mg  at bedtime  - Resume Astelin nasal spray - Continue Flonase nasal spray - Continue Breo 100 one puff daily in morning - We will ask our pharmacist is there is a more affordable inhaler fort you to use while in the donuts home   Follow-up: - 1 year with Dr. 

## 2021-04-08 NOTE — Assessment & Plan Note (Addendum)
-   Chronic rhinitis symptoms, he is compliant with Flonase.  - Recommend resuming Singulair 10mg  at bedtime and Astelin nasal spray as well

## 2021-04-08 NOTE — Telephone Encounter (Signed)
Can you please relay information to patient. Have him call us when he reaches the donut hole and we can try Advair.

## 2021-04-08 NOTE — Assessment & Plan Note (Addendum)
-   Clinical symptoms are consistent with mild intermittent asthma improved on low dose ICS/LABA. PFTs in March 2020 were normal. FENO on 11/15/18 was 49.  - Stable interval; No acute respiratory symptoms. He has a chronic cough. Repeat CT imaging in August 2020 showed resolution of previous PNA.  - Continue Breo 100 one puff daily in morning - Will ask pharmacy to help see if there is a more affordable ICS/LABA on his insurance plan as he will be approaching donut hole in August

## 2021-04-08 NOTE — Progress Notes (Signed)
@Patient  ID: , male    DOB: 05/17/1935, 86 y.o.   MRN: [id number]  Chief Complaint  Patient presents with   Follow-up    Nasal congestion.     Referring provider: [id number], MD  HPI: 86 year old male, former cigar smoker (cigars in [place] in 1960 <2 years). PMH significant for chronic cough, CAD, HTN.  Current patient of Dr. 1961, seen on 05/05/19.   HRCT in March 2020 showed extensive clustered centrilobular and tree-in-bud nodular opacity of the right lung with consolidation RLL. Repeat CT chest in August showed interval clearing of pneumonia, no acute process.   Previous [place] Encounters: 1/20/2020[facility] follow-up Patient presents today for hospital follow-up. He is feeling well, reports noticeable improvement in cough and wheeze with nebulizer treatments twice daily. No shortness of breath. Coughing was his biggest complaint. Symptoms have slowly returned but states not nearly as bad as they were. FENO 49 today. Started on Asmanex 2 puffs twice daily and Singulair at bedtime. HRCT planned for March.   12/06/2018- Follow-up  Patient presents today for 2-3 week follow up. Accompanied by wife. Complains of nasal congestion/post nasal drip with yellow mucus. Associated cough and occasional wheezing. Symptoms are worse at night. Continues using Asmanex twice daily, flonase nasal spray and singulair. Needs prn albuterol during the afternoon, reports benefit but only last a few hours. Wife states cough was so much better but returned this week. Denies shortness of breath. HRCT planned for March.    12/30/2018- Acute visit, pneumonia Presents today for an acute visit to review recent HRCT results. Accompanied by his wife. Patient had a high resolution Chest CT scan 2 days ago that showed extensive clustered centrilobular and tree-in-bud nodular opacity of the right lung, with consolidation of the dependent right lung base. No evidence of fibrotic interstitial lung  disease. Complains of congested/productive cough. He is bringing up yellow colored mucus from his chest. Wife reports that he wakes up at night needing a breathing treatment because he can't stop coughing. Nebulizer does helps. Associated nasal congestion, patient states that he can't breath through his nose when lying down but sitting up he's fine. Denies aspiration  01/12/2019 Patient presents today for a two week follow-up visit. Accompanied by his wife. Feeling great, back to baseline. Recent HRCT showed extensive clustered centrilobular and tree-in-bud nodular opacity of the right lung with consolidation of the dependent right lung case. Findings consistent with multifocal infection, specifically aspiration given distrubution. Completed Levaquin course. Sputum culture has shown normal growth, AFB negative. Reports that his cough has been much better but still has some. He has been taking mucinex as directed. Continues Breo inhaler. FENO was previously elevated but trending down. PFTs today appear normal.   05/05/19 Follow-up, Dr. 07/06/19  Chronic cough and nasal congestion. Swallow study back in 2019 showed unspecified dysphagia. If symptoms ongoing and recurrent aspiration recommended repeat eval speech-language pathology. May need F EES or repeat M BSS. He has had polyps removed by ENT in the past. Encouraged follow-up with ENT.  06/07/2019 Patient presents today for follow-up visit. He is doing well, continues to have chronic cough and nasal congestion. Cough is worse at night. Uses wedge pillow. Stopping taking pantoprazole because it did not seem to help his cough. Reports no GERD symptoms. CT chest on 8/10 showed clearing of pneumonia. No acute process. He is being followed by ENT at [facility] for recurrent nasal polyps. Underwent nasal endoscopy. Both sinus purulent material at the infundibulum.  Right sided sinus culture taken and positive for staph aureus. Starting Bactrim and tobramycin with sinus  irrigation. Will follow-up with them in 3-4 weeks.    04/06/2020 Patient presents today for 1 year follow-up. Reports chronic cough with production. He is getting up yellow mucus. He has a hard time breathing at night d/t nasal congestion. He is compliant with flonase and Astelin at bedtime. He has had previous nasal polyp surgery. He follows with Dr. Jearld Fenton with [facility]. He was last seen in November 2020. Uses Breo Ellipta 100 one puff daily in the morning as prescribed. He has not needed to use his Albuterol rescue inhaler. He has had both covid vaccine.   04/08/2021- Interim hx  Patient presents today for 1 year follow-up. He is doing well, no acute respiratory complaints. He is having to stand during today's visit due back pain which he will be seeing Dr. Katrinka Blazing for. He is complaint with BREO daily which helps a great deal. He will likely hit donut hole in August and is asking if there is a more affordable inhaler option for him to use. She has a chronic cough with some associated nasal congestion. He will occasionally get up mucus which is yellow in color. He is using flonase daily, unsure if he is still taking Singulair or Astelin nasal spray. HRCT in March 2020 showed extensive clustered centrilobular and tree-in-bud nodular opacity of the right lung with consolidation RLL. Repeat CT chest in August 2020 showed interval clearing of pneumonia, no acute process.   Significant testing: >> FENO 12/30/18 - 23 (37) (49) >> 12/30/2018 Ambulatory O2 low 94% RA  Pulmonary function testing: >>PFTs 01/12/2019 - FVC 2.75 (86%), FEV1 2.29 (103%), ratio 83. No BD response. DLCOunc 75% >>Spirometry 11/15/2018 -FVC 2.3 (68%), FEV1 2.0 (87%), ratio 90 Labs: >>IgE 15; Eos absolute 0.1; Resp allergy panel - neg Imaging: >> 06/06/2019 CT chest wo contrast- interval clearing of pneumonia, no acute process >> 12/27/18 HRCT- extensive clustered centrilobular and tree-in-bud nodular opacity of the right lung, with  consolidation of the dependent right lung base. No evidence of fibrotic interstitial lung disease. Findings are consistent with multifocal infection, and specifically aspiration given distribution. >> 10/25/19 CXR- Areas of subsegmental atelectasis or scarring in the lung bases bilaterally >> 10/23/18 CXR- Minimal bibasilar subsegmental atelectasis. >>10/23/18 CT abd/pelvis - 11 mm mean diameter nodule deep in the RIGHT LOWER LOBE adjacent to the RIGHT hemidiaphragm. Minimal dependent atelectasis posteriorly in the lower lobes.    Allergies  Allergen Reactions   Neosporin [Bacitracin-Polymyxin B] Other (See Comments)    Redness    Immunization History  Administered Date(s) Administered   Fluad Quad(high Dose 65+) 06/24/2019   Influenza Split 08/25/2011, 08/09/2012   Influenza Whole 07/11/2010   Influenza, High Dose Seasonal PF 08/14/2014, 07/16/2018   Influenza,inj,Quad PF,6+ Mos 08/11/2013, 08/18/2014, 07/11/2015, 07/14/2016, 07/20/2017   Influenza-Unspecified 07/24/2000   PFIZER(Purple Top)SARS-COV-2 Vaccination 11/15/2019, 12/06/2019, 07/27/2020, 03/08/2021   Pneumococcal Conjugate-13 01/25/2015   Pneumococcal Polysaccharide-23 09/15/2002   Td 12/13/2007, 05/31/2019   Zoster, Live 10/29/2010    Past Medical History:  Diagnosis Date   Allergy    Arthritis    "scattered" (10/26/2018)   Asthma    C. difficile diarrhea    CAD (coronary artery disease) 2005   s/p PCI of RCA   Carotid artery stenosis    1-39% bilateral by dopplers 10/2016   Carotid stenosis    "? side; Dr. Edilia Bo"   Cataract    NS OD  Cognitive impairment, mild, so stated    Colitis    Erectile dysfunction    GERD (gastroesophageal reflux disease)    Hyperlipidemia    Hypertension    Perforated stomach, acute 10/23/2018   Peripheral vascular disease (HCC)    Retinal detachment    Vertebrobasilar artery syndrome    Vertigo     Tobacco History: Social History   Tobacco Use  Smoking Status  Former   Pack years: 0.00   Types: Cigars   Quit date: 05/13/1961   Years since quitting: 59.9  Smokeless Tobacco Never  Tobacco Comments   "never inhaled" in the early 60's    Counseling given: Not Answered Tobacco comments: "never inhaled" in the early 60's    Outpatient Medications Prior to Visit  Medication Sig Dispense Refill   acetaminophen (TYLENOL) 500 MG tablet Take 500 mg by mouth 2 (two) times daily.     albuterol (PROVENTIL) (2.5 MG/3ML) 0.083% nebulizer solution Take 3 mLs (2.5 mg total) by nebulization every 6 (six) hours as needed for wheezing or shortness of breath. 90 vial 3   albuterol (VENTOLIN HFA) 108 (90 Base) MCG/ACT inhaler INHALE 2 PUFFS INTO LUNGS EVERY 6 HOURS AS NEEDED FOR WHEEZING OR SHORTNESS OF BREATH. 8.5 g 3   amLODipine (NORVASC) 5 MG tablet Take 1 tablet (5 mg total) by mouth daily. 90 tablet 3   aspirin EC 81 MG tablet Take 81 mg by mouth daily.     atorvastatin (LIPITOR) 80 MG tablet Take 1 tablet (80 mg total) by mouth daily. 90 tablet 3   carvedilol (COREG) 6.25 MG tablet Take 1 tablet (6.25 mg total) by mouth 2 (two) times daily with a meal. 180 tablet 3   cholecalciferol (VITAMIN D3) 25 MCG (1000 UT) tablet Take 1,000 Units by mouth daily.     donepezil (ARICEPT) 10 MG tablet Take 1 tablet (10 mg total) by mouth daily. 90 tablet 1   Eyelid Cleansers (OCUSOFT EYELID CLEANSING) PADS Place 1 application into both eyes 2 (two) times daily.     fish oil-omega-3 fatty acids 1000 MG capsule Take 2 g by mouth 2 (two) times daily.      fluticasone furoate-vilanterol (BREO ELLIPTA) 100-25 MCG/INH AEPB INHALE 1 PUFF INTO THE LUNGS DAILY 180 each 3   fluticasone furoate-vilanterol (BREO ELLIPTA) 100-25 MCG/INH AEPB Inhale 1 puff into the lungs daily. 1 each 0   gabapentin (NEURONTIN) 100 MG capsule Take 2 capsules (200 mg total) by mouth at bedtime. (Patient not taking: Reported on 03/11/2021) 180 capsule 1   gabapentin (NEURONTIN) 300 MG capsule TAKE 1  CAPSULE(300 MG) BY MOUTH THREE TIMES DAILY 90 capsule 3   hydrochlorothiazide (HYDRODIURIL) 25 MG tablet Take 1 tablet (25 mg total) by mouth daily. 90 tablet 3   ofloxacin (OCUFLOX) 0.3 % ophthalmic solution INSTILL 1 DROP IN LEFT EYE FOUR TIMES DAILY FOR 7 DAYS BEGINNING AFTER SURGERY     Polyvinyl Alcohol-Povidone (REFRESH OP) Place 1 drop into both eyes 2 (two) times daily.     Saw Palmetto 500 MG CAPS Take 1,000 mg by mouth 2 (two) times daily.      azelastine (ASTELIN) 0.1 % nasal spray Place 1 spray into both nostrils 2 (two) times daily. 30 mL 2   fluticasone (FLONASE) 50 MCG/ACT nasal spray 1 spray each nostril twice daily 16 g 5   montelukast (SINGULAIR) 10 MG tablet Take 1 tablet (10 mg total) by mouth at bedtime. 30 tablet 11   No facility-administered medications  prior to visit.    Review of Systems  Review of Systems  Constitutional: Negative.   HENT:  Positive for postnasal drip.   Respiratory:  Positive for cough. Negative for chest tightness, shortness of breath and wheezing.     Physical Exam  BP (!) 112/58 (BP Location: Right Arm, Patient Position: Sitting, Cuff Size: Normal)   Pulse (!) 57   Temp 98 F (36.7 C) (Temporal)   Ht 5\' 5"  (1.651 m)   Wt 146 lb 6.4 oz (66.4 kg)   SpO2 96%   BMI 24.36 kg/m  Physical Exam Constitutional:      Appearance: Normal appearance.  Cardiovascular:     Rate and Rhythm: Normal rate and regular rhythm.  Pulmonary:     Effort: Pulmonary effort is normal.     Breath sounds: No wheezing, rhonchi or rales.     Comments: CTA Neurological:     Mental Status: He is alert.  Psychiatric:        Mood and Affect: Mood normal.        Behavior: Behavior normal.        Thought Content: Thought content normal.        Judgment: Judgment normal.     Lab Results:  CBC    Component Value Date/Time   WBC 8.4 11/02/2020 1428   RBC 4.63 11/02/2020 1428   HGB 13.3 11/02/2020 1428   HCT 39.8 11/02/2020 1428   PLT 217 11/02/2020 1428    MCV 86.0 11/02/2020 1428   MCH 28.7 11/02/2020 1428   MCHC 33.4 11/02/2020 1428   RDW 13.8 11/02/2020 1428   LYMPHSABS 2,083 11/02/2020 1428   MONOABS 0.7 05/07/2020 1046   EOSABS 109 11/02/2020 1428   BASOSABS 92 11/02/2020 1428    BMET    Component Value Date/Time   NA 140 11/02/2020 1428   NA 138 09/09/2019 0831   K 3.6 11/02/2020 1428   CL 102 11/02/2020 1428   CO2 30 11/02/2020 1428   GLUCOSE 85 11/02/2020 1428   GLUCOSE 98 10/04/2010 0000   BUN 20 11/02/2020 1428   BUN 8 09/09/2019 0831   CREATININE 1.09 11/02/2020 1428   CALCIUM 9.3 11/02/2020 1428   GFRNONAA 72 09/09/2019 0831   GFRAA 83 09/09/2019 0831    BNP No results found for: BNP  ProBNP No results found for: PROBNP  Imaging: No results found.   Assessment & Plan:   Mild asthma - Clinical symptoms are consistent with mild intermittent asthma improved on low dose ICS/LABA. PFTs in March 2020 were normal.  - Stable interval; No acute respiratory symptoms. He has a chronic cough. Repeat CT imaging in August 2020 showed resolution of previous PNA.  - Continue Breo 100 one puff daily in morning - Will ask pharmacy to help see if there is a more affordable ICS/LABA on his insurance plan as he will be approaching donut hole in August   Rhinitis, chronic - Chronic rhinitis symptoms, he is compliant with Flonase.  - Recommend resuming Singulair 10mg  at bedtime and Astelin nasal spray as well  September, NP 04/08/2021

## 2021-04-08 NOTE — Telephone Encounter (Signed)
The current copay for Advair, Symbicort, and Dulera would be the same price as the pt is currently paying for the [facility] now. However, there isn't any way for me to tell ahead of time what kind of prices the pt will be looking at once he is actually IN the donut hole. At this time the only potentially helpful information I can give is that Advair carries a cheaper cash price than Breo by around $85. That may help delay pt from reaching the coverage gap and/or reduce prescription price while in donut hole. But of course there are no guarantees that either of these options will be true. Hope that helps

## 2021-04-08 NOTE — Telephone Encounter (Signed)
Patient will be in donut hole in August. He is currently on Breo 100. Can you see if there is a more affordable ICS/LABA on patients plan. Thank you

## 2021-04-09 NOTE — Telephone Encounter (Signed)
Called and spoke with pt letting him know the info stated by both pharmacy team as well as Beth and he verbalized understanding. Pt will call us back in August once he does know that he has reached the donut hole so we can then change therapy. Nothing further needed.

## 2021-04-11 NOTE — Progress Notes (Signed)
PCCM: Thanks for seeing Deidre Ala, DO [facility] 04/11/2021 6:13 PM

## 2021-04-12 NOTE — Progress Notes (Signed)
[name] [address] [place] [address] Phone: [phone] Subjective:   I [name] am serving as a Neurosurgeon for Dr. Antoine Primas.  This visit occurred during the SARS-CoV-2 public health emergency.  Safety protocols were in place, including screening questions prior to the visit, additional usage of staff PPE, and extensive cleaning of exam room while observing appropriate contact time as indicated for disinfecting solutions.   I'm seeing this patient by the request  of:  Robert Hatch, MD  CC: Back pain follow-up  [id number]  01/10/2021 Patient has done relatively well overall.  Has responded again to the epidurals.  Patient does still has pain and points more over the gluteal been truly over the insertion at the ischial area.  Patient does have the calcific changes or ossification center at the inferior aspect of the ischial tuberosity but too low for patient's symptoms.  We discussed potential injections into the area where patient is having it but unfortunately do not know if it would make a significant improvement.  Patient wants to hold on that at this point.  We discussed potential referral to orthopedic surgery to discuss other treatment options which patient also declined.  Still states that the pain is only when he changes from a sitting position to a standing position.  Other than that has no significant pain.  We also discussed other imaging would include potentially an MRI of the pelvis area which patient also declines.  At this point no change in management and follow-up again in 3 months  Update 04/16/2021 [name] is a 86 y.o. male coming in with complaint of lumbar spinal stenosis. Patient states has pain with sitting in the left SI joint pain. Bending over to pick things up is also painful. Driving for long periods of time is painful. Driving a lot and states the pain will hurt before he drives at times. When he stretches  his legs out he feels no pain and believes the pain is mostly with hip flexion.   Last epidural for the back was December 13, 2020.    Past Medical History:  Diagnosis Date   Allergy    Arthritis    "scattered" (10/26/2018)   Asthma    C. difficile diarrhea    CAD (coronary artery disease) 2005   s/p PCI of RCA   Carotid artery stenosis    1-39% bilateral by dopplers 10/2016   Carotid stenosis    "? side; Dr. Edilia Bo"   Cataract    NS OD   Cognitive impairment, mild, so stated    Colitis    Erectile dysfunction    GERD (gastroesophageal reflux disease)    Hyperlipidemia    Hypertension    Perforated stomach, acute 10/23/2018   Peripheral vascular disease (HCC)    Retinal detachment    Vertebrobasilar artery syndrome    Vertigo    Past Surgical History:  Procedure Laterality Date   APPENDECTOMY  1954   CATARACT EXTRACTION Left 08/08/2019   Dr. Hazle Quant   COLONOSCOPY     ESOPHAGOGASTRODUODENOSCOPY (EGD) WITH ESOPHAGEAL DILATION  09/2018   EYE SURGERY     LAPAROTOMY N/A 10/23/2018   Procedure: EXPLORATORY LAPAROTOMY WITH GRAHAM PATCH FOR PERFORATED ULCER AND PLACEMENT OF GASTRIC TUBE;  Surgeon: Emelia Loron, MD;  Location: [facility] OR;  Service: General;  Laterality: N/A;   NASAL SINUS SURGERY  05/2018   RETINAL DETACHMENT SURGERY     "? side"   UPPER GASTROINTESTINAL ENDOSCOPY  Social History   Socioeconomic History   Marital status: Married    Spouse name: Not on file   Number of children: 3   Years of education: Not on file   Highest education level: Not on file  Occupational History   Occupation: Retired Teaching laboratory technician: RETIRED  Tobacco Use   Smoking status: Former    Pack years: 0.00    Types: Cigars    Quit date: 05/13/1961    Years since quitting: 59.9   Smokeless tobacco: Never   Tobacco comments:    "never inhaled" in the early 60's   Vaping Use   Vaping Use: Never used  Substance and Sexual Activity   Alcohol use: Yes     Alcohol/week: 14.0 standard drinks    Types: 14 Glasses of wine per week   Drug use: Never   Sexual activity: Not Currently  Other Topics Concern   Not on file  Social History Narrative   Married with 1 son and 2 daughters.  Retired.   2 caffeinated beverages daily, 3 glasses of wine daily   Never smoker no drug use no tobacco   Social Determinants of Corporate investment banker Strain: Low Risk    Difficulty of Paying Living Expenses: Not hard at all  Food Insecurity: No Food Insecurity   Worried About Programme researcher, broadcasting/film/video in the Last Year: Never true   Ran Out of Food in the Last Year: Never true  Transportation Needs: No Transportation Needs   Lack of Transportation (Medical): No   Lack of Transportation (Non-Medical): No  Physical Activity: Inactive   Days of Exercise per Week: 0 days   Minutes of Exercise per Session: 0 min  Stress: No Stress Concern Present   Feeling of Stress : Not at all  Social Connections: Moderately Integrated   Frequency of Communication with Friends and Family: More than three times a week   Frequency of Social Gatherings with Friends and Family: More than three times a week   Attends Religious Services: Never   Database administrator or Organizations: Yes   Attends Engineer, structural: More than 4 times per year   Marital Status: Married   Allergies  Allergen Reactions   Neosporin [Bacitracin-Polymyxin B] Other (See Comments)    Redness   Family History  Problem Relation Age of Onset   Stroke Mother    Hyperlipidemia Mother    Hypertension Mother    Deep vein thrombosis Mother    Stroke Father    Hyperlipidemia Father    Hypertension Father    Deep vein thrombosis Father    Diverticulitis Father    Ulcerative colitis Father    Colon polyps Father    Colon cancer Neg Hx    Esophageal cancer Neg Hx    Stomach cancer Neg Hx    Rectal cancer Neg Hx      Current Outpatient Medications (Cardiovascular):    amLODipine  (NORVASC) 5 MG tablet, Take 1 tablet (5 mg total) by mouth daily.   atorvastatin (LIPITOR) 80 MG tablet, Take 1 tablet (80 mg total) by mouth daily.   carvedilol (COREG) 6.25 MG tablet, Take 1 tablet (6.25 mg total) by mouth 2 (two) times daily with a meal.   hydrochlorothiazide (HYDRODIURIL) 25 MG tablet, Take 1 tablet (25 mg total) by mouth daily.  Current Outpatient Medications (Respiratory):    albuterol (PROVENTIL) (2.5 MG/3ML) 0.083% nebulizer solution, Take 3 mLs (2.5 mg total)  by nebulization every 6 (six) hours as needed for wheezing or shortness of breath.   albuterol (VENTOLIN HFA) 108 (90 Base) MCG/ACT inhaler, INHALE 2 PUFFS INTO LUNGS EVERY 6 HOURS AS NEEDED FOR WHEEZING OR SHORTNESS OF BREATH.   azelastine (ASTELIN) 0.1 % nasal spray, Place 1 spray into both nostrils 2 (two) times daily.   fluticasone (FLONASE) 50 MCG/ACT nasal spray, 1 spray each nostril twice daily   fluticasone furoate-vilanterol (BREO ELLIPTA) 100-25 MCG/INH AEPB, INHALE 1 PUFF INTO THE LUNGS DAILY   fluticasone furoate-vilanterol (BREO ELLIPTA) 100-25 MCG/INH AEPB, Inhale 1 puff into the lungs daily.   fluticasone furoate-vilanterol (BREO ELLIPTA) 100-25 MCG/INH AEPB, Inhale 1 puff into the lungs daily.   montelukast (SINGULAIR) 10 MG tablet, Take 1 tablet (10 mg total) by mouth at bedtime.  Current Outpatient Medications (Analgesics):    acetaminophen (TYLENOL) 500 MG tablet, Take 500 mg by mouth 2 (two) times daily.   aspirin EC 81 MG tablet, Take 81 mg by mouth daily.   Current Outpatient Medications (Other):    cholecalciferol (VITAMIN D3) 25 MCG (1000 UT) tablet, Take 1,000 Units by mouth daily.   donepezil (ARICEPT) 10 MG tablet, Take 1 tablet (10 mg total) by mouth daily.   Eyelid Cleansers (OCUSOFT EYELID CLEANSING) PADS, Place 1 application into both eyes 2 (two) times daily.   fish oil-omega-3 fatty acids 1000 MG capsule, Take 2 g by mouth 2 (two) times daily.    gabapentin (NEURONTIN) 100 MG  capsule, Take 2 capsules (200 mg total) by mouth at bedtime.   gabapentin (NEURONTIN) 300 MG capsule, TAKE 1 CAPSULE(300 MG) BY MOUTH THREE TIMES DAILY   ofloxacin (OCUFLOX) 0.3 % ophthalmic solution, INSTILL 1 DROP IN LEFT EYE FOUR TIMES DAILY FOR 7 DAYS BEGINNING AFTER SURGERY   Polyvinyl Alcohol-Povidone (REFRESH OP), Place 1 drop into both eyes 2 (two) times daily.   Saw Palmetto 500 MG CAPS, Take 1,000 mg by mouth 2 (two) times daily.    Reviewed prior external information including notes and imaging from  primary care provider As well as notes that were available from care everywhere and other healthcare systems.  Past medical history, social, surgical and family history all reviewed in electronic medical record.  No pertanent information unless stated regarding to the chief complaint.   Review of Systems:  No headache, visual changes, nausea, vomiting, diarrhea, constipation, dizziness, abdominal pain, skin rash, fevers, chills, night sweats, weight loss, swollen lymph nodes,  joint swelling, chest pain, shortness of breath, mood changes. POSITIVE muscle aches, body aches  Objective  Blood pressure 130/70, pulse (!) 57, height 5\' 5"  (1.651 m), weight 146 lb (66.2 kg), SpO2 97 %.   General: No apparent distress alert and oriented x3 mood and affect normal, dressed appropriately.  HEENT: Pupils equal, extraocular movements intact  Respiratory: Patient's speak in full sentences and does not appear short of breath  Cardiovascular: No lower extremity edema, non tender, no erythema  Gait antalgic. Varus deformity of the knees noted. Patient does have atrophy of the lower extremities bilaterally.  Patient does have significant loss of lordosis.  Only 5 degrees of extension of the back noted.  Only 25 degrees of flexion of the back noted.  Neurovascularly intact distally with symmetric strength.  Significant tightness of the hamstrings bilaterally noted.  Pain seems to be more localized to  the right piriformis and the right gluteal area.  Only seems to give him severe pain when he goes from a seated to standing position  after sitting for long amount of time   After verbal consent patient was prepped with alcohol swab and with a 21-gauge 2 inch needle injected into the right piriformis with a total of 2 cc of 0.5% Marcaine and 1 cc of Kenalog 40 mg/mL.  No blood loss.  Band-Aid placed.  Postinjection instructions given.  Patient able to ambulate with no significant difficulty at the end of the injection.    Impression and Recommendations:     The above documentation has been reviewed and is accurate and complete Judi SaaZachary M Jaxon Flatt, DO

## 2021-04-16 ENCOUNTER — Ambulatory Visit: Payer: Medicare Other | Admitting: Family Medicine

## 2021-04-16 ENCOUNTER — Other Ambulatory Visit: Payer: Self-pay

## 2021-04-16 ENCOUNTER — Encounter: Payer: Self-pay | Admitting: Family Medicine

## 2021-04-16 DIAGNOSIS — G5701 Lesion of sciatic nerve, right lower limb: Secondary | ICD-10-CM

## 2021-04-16 DIAGNOSIS — M48062 Spinal stenosis, lumbar region with neurogenic claudication: Secondary | ICD-10-CM | POA: Diagnosis not present

## 2021-04-16 NOTE — Assessment & Plan Note (Signed)
Spinal stenosis.  Discussed degenerative disc disease.  Patient was given though a right-sided piriformis injection today.  Patient tolerated the procedure well.  Do not know if patient will respond to this or not.  Discussed changing in his driving positioning.  Discussed icing regimen and home exercises.  Increase activity slowly.  If patient does not respond we will get an order the epidural and patient will see me 4 weeks afterwards.

## 2021-04-16 NOTE — Patient Instructions (Addendum)
Good to see you If not better in 2 weeks call we will order epidural See me again in 4-6 weeks if you decide to do epidural

## 2021-04-16 NOTE — Assessment & Plan Note (Signed)
Attempted injection today.  Do feel that it is more likely secondary to lumbar radiculopathy.  We will see how patient responds.  Patient's imaging previously did not show anything specific in the area where he is having the discomfort.  Does have the ossification in the hamstrings but not having the pain that low.  Once again if no significant improvement will order the epidural of the lumbar spine.

## 2021-04-24 ENCOUNTER — Encounter: Payer: Self-pay | Admitting: *Deleted

## 2021-05-02 ENCOUNTER — Encounter: Payer: Medicare Other | Admitting: Family Medicine

## 2021-05-03 ENCOUNTER — Other Ambulatory Visit: Payer: Self-pay

## 2021-05-03 ENCOUNTER — Encounter: Payer: Self-pay | Admitting: Family Medicine

## 2021-05-03 ENCOUNTER — Ambulatory Visit (INDEPENDENT_AMBULATORY_CARE_PROVIDER_SITE_OTHER): Payer: Medicare Other | Admitting: Family Medicine

## 2021-05-03 VITALS — BP 122/80 | HR 61 | Temp 98.0°F | Resp 19 | Ht 65.0 in | Wt 144.4 lb

## 2021-05-03 DIAGNOSIS — Z0000 Encounter for general adult medical examination without abnormal findings: Secondary | ICD-10-CM

## 2021-05-03 DIAGNOSIS — E7800 Pure hypercholesterolemia, unspecified: Secondary | ICD-10-CM | POA: Diagnosis not present

## 2021-05-03 DIAGNOSIS — I10 Essential (primary) hypertension: Secondary | ICD-10-CM | POA: Diagnosis not present

## 2021-05-03 LAB — CBC WITH DIFFERENTIAL/PLATELET
Basophils Absolute: 0.1 10*3/uL (ref 0.0–0.1)
Basophils Relative: 1 % (ref 0.0–3.0)
Eosinophils Absolute: 0.1 10*3/uL (ref 0.0–0.7)
Eosinophils Relative: 1.5 % (ref 0.0–5.0)
HCT: 42 % (ref 39.0–52.0)
Hemoglobin: 14 g/dL (ref 13.0–17.0)
Lymphocytes Relative: 24.2 % (ref 12.0–46.0)
Lymphs Abs: 1.8 10*3/uL (ref 0.7–4.0)
MCHC: 33.3 g/dL (ref 30.0–36.0)
MCV: 88.4 fl (ref 78.0–100.0)
Monocytes Absolute: 0.8 10*3/uL (ref 0.1–1.0)
Monocytes Relative: 10.7 % (ref 3.0–12.0)
Neutro Abs: 4.7 10*3/uL (ref 1.4–7.7)
Neutrophils Relative %: 62.6 % (ref 43.0–77.0)
Platelets: 169 10*3/uL (ref 150.0–400.0)
RBC: 4.75 Mil/uL (ref 4.22–5.81)
RDW: 14.1 % (ref 11.5–15.5)
WBC: 7.5 10*3/uL (ref 4.0–10.5)

## 2021-05-03 LAB — BASIC METABOLIC PANEL
BUN: 22 mg/dL (ref 6–23)
CO2: 30 mEq/L (ref 19–32)
Calcium: 9.2 mg/dL (ref 8.4–10.5)
Chloride: 101 mEq/L (ref 96–112)
Creatinine, Ser: 1.2 mg/dL (ref 0.40–1.50)
GFR: 55.77 mL/min — ABNORMAL LOW (ref >60.00)
Glucose, Bld: 77 mg/dL (ref 70–99)
Potassium: 3.8 mEq/L (ref 3.5–5.1)
Sodium: 141 mEq/L (ref 135–145)

## 2021-05-03 LAB — LIPID PANEL
Cholesterol: 148 mg/dL (ref 0–200)
HDL: 47.3 mg/dL (ref >39.00)
LDL Cholesterol: 74 mg/dL (ref 0–99)
NonHDL: 100.88
Total CHOL/HDL Ratio: 3
Triglycerides: 132 mg/dL (ref 0.0–149.0)
VLDL: 26.4 mg/dL (ref 0.0–40.0)

## 2021-05-03 LAB — HEPATIC FUNCTION PANEL
ALT: 25 U/L (ref 0–53)
AST: 24 U/L (ref 0–37)
Albumin: 4.4 g/dL (ref 3.5–5.2)
Alkaline Phosphatase: 76 U/L (ref 39–117)
Bilirubin, Direct: 0.1 mg/dL (ref 0.0–0.3)
Total Bilirubin: 0.9 mg/dL (ref 0.2–1.2)
Total Protein: 6.9 g/dL (ref 6.0–8.3)

## 2021-05-03 LAB — TSH: TSH: 3.51 u[IU]/mL (ref 0.35–5.50)

## 2021-05-03 NOTE — Assessment & Plan Note (Signed)
Pt's PE WNL and unchanged from previous.  No longer doing colonoscopy or PSA.  Check labs.  Anticipatory guidance provided.

## 2021-05-03 NOTE — Assessment & Plan Note (Signed)
Chronic problem.  Well controlled today.  Currently asymptomatic. °

## 2021-05-03 NOTE — Patient Instructions (Addendum)
Follow up in 6 months to recheck BP and cholesterol We'll notify you of your lab results and make any changes if needed Continue to work on healthy diet and exercise as you are able Call with any questions or concerns Stay Safe!  Stay Healthy! Have a great summer!! Enjoy your football games!!!

## 2021-05-03 NOTE — Progress Notes (Signed)
   Subjective:    Patient ID: [name], male    DOB: Mar 16, 1935, 86 y.o.   MRN: [id number]  HPI CPE- UTD on PNA vaccines, Tdap, COVID.  No longer doing colon cancer screen or PSA.  Reviewed past medical, surgical, family and social histories.   Patient Care Team    Relationship Specialty Notifications Start End  Sheliah Hatch, MD PCP - General   10/09/10   Quintella Reichert, MD PCP - Cardiology Cardiology Admissions 04/22/18   Quintella Reichert, MD Consulting Physician Cardiology  09/13/14   Chuck Hint, MD Consulting Physician Vascular Surgery  03/22/15   Jerilee Field, MD Consulting Physician Urology  04/14/18   Iva Boop, MD Consulting Physician Gastroenterology  04/14/18   Josephine Igo, DO Consulting Physician Pulmonary Disease  11/11/19   Jamal Collin, MD Consulting Physician Ophthalmology  03/07/20   Dahlia Byes, [facility] Pharmacist Pharmacist  04/05/21    Comment: Phone [phone]    Health Maintenance  Topic Date Due   Zoster Vaccines- Shingrix (1 of 2) Never done   INFLUENZA VACCINE  05/27/2021   TETANUS/TDAP  05/30/2029   COVID-19 Vaccine  Completed   PNA vac Low Risk Adult  Completed   HPV VACCINES  Aged Out     Review of Systems Patient reports no vision/hearing changes, anorexia, fever ,adenopathy, persistant/recurrent hoarseness, swallowing issues, chest pain, palpitations, edema, persistant/recurrent cough, hemoptysis, dyspnea (rest,exertional, paroxysmal nocturnal), gastrointestinal  bleeding (melena, rectal bleeding), abdominal pain, excessive heart burn, GU symptoms (dysuria, hematuria, voiding/incontinence issues) syncope, focal weakness, memory loss, numbness & tingling, skin/hair/nail changes, depression, anxiety, abnormal bruising/bleeding, musculoskeletal symptoms/signs.   This visit occurred during the SARS-CoV-2 public health emergency.  Safety protocols were in place, including screening questions prior to the visit,  additional usage of staff PPE, and extensive cleaning of exam room while observing appropriate contact time as indicated for disinfecting solutions.      Objective:   Physical Exam General Appearance:    Alert, cooperative, no distress, appears stated age  Head:    Normocephalic, without obvious abnormality, atraumatic  Eyes:    PERRL, conjunctiva/corneas clear, EOM's intact, fundi    benign, both eyes       Ears:    Normal TM's and external ear canals, both ears  Nose:   Deferred due to COVID  Throat:   Neck:   Supple, symmetrical, trachea midline, no adenopathy;       thyroid:  No enlargement/tenderness/nodules  Back:     Symmetric, no curvature, ROM normal, no CVA tenderness  Lungs:     Clear to auscultation bilaterally, respirations unlabored  Chest wall:    No tenderness or deformity  Heart:    Regular rate and rhythm, S1 and S2 normal, no murmur, rub   or gallop  Abdomen:     Soft, non-tender, bowel sounds active all four quadrants,    no masses, no organomegaly  Genitalia:    deferred  Rectal:    Extremities:   Extremities normal, atraumatic, no cyanosis or edema  Pulses:   2+ and symmetric all extremities  Skin:   Skin color, texture, turgor normal, no rashes or lesions  Lymph nodes:   Cervical, supraclavicular, and axillary nodes normal  Neurologic:   CNII-XII intact. Normal strength, sensation and reflexes      throughout          Assessment & Plan:

## 2021-05-03 NOTE — Assessment & Plan Note (Signed)
Chronic problem.  Check labs.  Adjust meds prn  

## 2021-05-07 ENCOUNTER — Telehealth: Payer: Self-pay

## 2021-05-07 NOTE — Chronic Care Management (AMB) (Addendum)
    Chronic Care Management Pharmacy Assistant   Name: [name]  MRN: [id number] DOB: 1934/12/21    Reason for Encounter: Reschedule Appointment   Patient rescheduled his initial appointment with Dahlia Byes, CPP for Wednesday August 3rd at 1030 am.  April Amie Critchley, [facility] Clinical Pharmacist Assistant [phone]

## 2021-05-08 ENCOUNTER — Telehealth: Payer: Medicare Other

## 2021-05-13 NOTE — Progress Notes (Signed)
[name] [address] [place] [address] Phone: [phone] Subjective:   I [name] am serving as a Neurosurgeon for Dr. Antoine Primas.  This visit occurred during the SARS-CoV-2 public health emergency.  Safety protocols were in place, including screening questions prior to the visit, additional usage of staff PPE, and extensive cleaning of exam room while observing appropriate contact time as indicated for disinfecting solutions.   I'm seeing this patient by the request  of:  Sheliah Hatch, MD  CC: Low back pain follow-up  [id number]  04/16/2021 Attempted injection today.  Do feel that it is more likely secondary to lumbar radiculopathy.  We will see how patient responds.  Patient's imaging previously did not show anything specific in the area where he is having the discomfort.  Does have the ossification in the hamstrings but not having the pain that low.  Once again if no significant improvement will order the epidural of the lumbar spine.  Spinal stenosis.  Discussed degenerative disc disease.  Patient was given though a right-sided piriformis injection today.  Patient tolerated the procedure well.  Do not know if patient will respond to this or not.  Discussed changing in his driving positioning.  Discussed icing regimen and home exercises.  Increase activity slowly.  If patient does not respond we will get an order the epidural and patient will see me 4 weeks afterwards.  Update 05/14/2021 [name] is a 86 y.o. male coming in with complaint of back and R hip pain.  Patient was seen 1 month ago and given an injection in the right piriformis area and to see if this was responding.  Patient states he is feeling bad today. Sitting and back flexion is painful. Getting out of the car and standing is also painful. Injection did not help.      Past Medical History:  Diagnosis Date   Allergy    Arthritis    "scattered" (10/26/2018)    Asthma    C. difficile diarrhea    CAD (coronary artery disease) 2005   s/p PCI of RCA   Carotid artery stenosis    1-39% bilateral by dopplers 10/2016   Carotid stenosis    "? side; Dr. Edilia Bo"   Cataract    NS OD   Cognitive impairment, mild, so stated    Colitis    Erectile dysfunction    GERD (gastroesophageal reflux disease)    Hyperlipidemia    Hypertension    Perforated stomach, acute 10/23/2018   Peripheral vascular disease (HCC)    Retinal detachment    Vertebrobasilar artery syndrome    Vertigo    Past Surgical History:  Procedure Laterality Date   APPENDECTOMY  1954   CATARACT EXTRACTION Left 08/08/2019   Dr. Hazle Quant   COLONOSCOPY     ESOPHAGOGASTRODUODENOSCOPY (EGD) WITH ESOPHAGEAL DILATION  09/2018   EYE SURGERY     LAPAROTOMY N/A 10/23/2018   Procedure: EXPLORATORY LAPAROTOMY WITH GRAHAM PATCH FOR PERFORATED ULCER AND PLACEMENT OF GASTRIC TUBE;  Surgeon: Emelia Loron, MD;  Location: [facility] OR;  Service: General;  Laterality: N/A;   NASAL SINUS SURGERY  05/2018   RETINAL DETACHMENT SURGERY     "? side"   UPPER GASTROINTESTINAL ENDOSCOPY     Social History   Socioeconomic History   Marital status: Married    Spouse name: Not on file   Number of children: 3   Years of education: Not on file   Highest education level:  Not on file  Occupational History   Occupation: Retired Teaching laboratory technician: RETIRED  Tobacco Use   Smoking status: Former    Types: Cigars    Quit date: 05/13/1961    Years since quitting: 60.0   Smokeless tobacco: Never   Tobacco comments:    "never inhaled" in the early 60's   Vaping Use   Vaping Use: Never used  Substance and Sexual Activity   Alcohol use: Yes    Alcohol/week: 14.0 standard drinks    Types: 14 Glasses of wine per week   Drug use: Never   Sexual activity: Not Currently  Other Topics Concern   Not on file  Social History Narrative   Married with 1 son and 2 daughters.  Retired.   2 caffeinated  beverages daily, 3 glasses of wine daily   Never smoker no drug use no tobacco   Social Determinants of Corporate investment banker Strain: Low Risk    Difficulty of Paying Living Expenses: Not hard at all  Food Insecurity: No Food Insecurity   Worried About Programme researcher, broadcasting/film/video in the Last Year: Never true   Ran Out of Food in the Last Year: Never true  Transportation Needs: No Transportation Needs   Lack of Transportation (Medical): No   Lack of Transportation (Non-Medical): No  Physical Activity: Inactive   Days of Exercise per Week: 0 days   Minutes of Exercise per Session: 0 min  Stress: No Stress Concern Present   Feeling of Stress : Not at all  Social Connections: Moderately Integrated   Frequency of Communication with Friends and Family: More than three times a week   Frequency of Social Gatherings with Friends and Family: More than three times a week   Attends Religious Services: Never   Database administrator or Organizations: Yes   Attends Engineer, structural: More than 4 times per year   Marital Status: Married   Allergies  Allergen Reactions   Neosporin [Bacitracin-Polymyxin B] Other (See Comments)    Redness   Family History  Problem Relation Age of Onset   Stroke Mother    Hyperlipidemia Mother    Hypertension Mother    Deep vein thrombosis Mother    Stroke Father    Hyperlipidemia Father    Hypertension Father    Deep vein thrombosis Father    Diverticulitis Father    Ulcerative colitis Father    Colon polyps Father    Colon cancer Neg Hx    Esophageal cancer Neg Hx    Stomach cancer Neg Hx    Rectal cancer Neg Hx      Current Outpatient Medications (Cardiovascular):    amLODipine (NORVASC) 5 MG tablet, Take 1 tablet (5 mg total) by mouth daily.   atorvastatin (LIPITOR) 80 MG tablet, Take 1 tablet (80 mg total) by mouth daily.   carvedilol (COREG) 6.25 MG tablet, Take 1 tablet (6.25 mg total) by mouth 2 (two) times daily with a meal.    hydrochlorothiazide (HYDRODIURIL) 25 MG tablet, Take 1 tablet (25 mg total) by mouth daily.  Current Outpatient Medications (Respiratory):    albuterol (PROVENTIL) (2.5 MG/3ML) 0.083% nebulizer solution, Take 3 mLs (2.5 mg total) by nebulization every 6 (six) hours as needed for wheezing or shortness of breath.   albuterol (VENTOLIN HFA) 108 (90 Base) MCG/ACT inhaler, INHALE 2 PUFFS INTO LUNGS EVERY 6 HOURS AS NEEDED FOR WHEEZING OR SHORTNESS OF BREATH.   azelastine (ASTELIN)  0.1 % nasal spray, Place 1 spray into both nostrils 2 (two) times daily.   fluticasone (FLONASE) 50 MCG/ACT nasal spray, 1 spray each nostril twice daily   fluticasone furoate-vilanterol (BREO ELLIPTA) 100-25 MCG/INH AEPB, INHALE 1 PUFF INTO THE LUNGS DAILY   fluticasone furoate-vilanterol (BREO ELLIPTA) 100-25 MCG/INH AEPB, Inhale 1 puff into the lungs daily.   fluticasone furoate-vilanterol (BREO ELLIPTA) 100-25 MCG/INH AEPB, Inhale 1 puff into the lungs daily.   montelukast (SINGULAIR) 10 MG tablet, Take 1 tablet (10 mg total) by mouth at bedtime.  Current Outpatient Medications (Analgesics):    acetaminophen (TYLENOL) 500 MG tablet, Take 500 mg by mouth 2 (two) times daily.   aspirin EC 81 MG tablet, Take 81 mg by mouth daily.   Current Outpatient Medications (Other):    cholecalciferol (VITAMIN D3) 25 MCG (1000 UT) tablet, Take 1,000 Units by mouth daily.   donepezil (ARICEPT) 10 MG tablet, Take 1 tablet (10 mg total) by mouth daily.   Eyelid Cleansers (OCUSOFT EYELID CLEANSING) PADS, Place 1 application into both eyes 2 (two) times daily.   fish oil-omega-3 fatty acids 1000 MG capsule, Take 2 g by mouth 2 (two) times daily.    gabapentin (NEURONTIN) 100 MG capsule, Take 2 capsules (200 mg total) by mouth at bedtime.   gabapentin (NEURONTIN) 300 MG capsule, TAKE 1 CAPSULE(300 MG) BY MOUTH THREE TIMES DAILY   ofloxacin (OCUFLOX) 0.3 % ophthalmic solution, INSTILL 1 DROP IN LEFT EYE FOUR TIMES DAILY FOR 7 DAYS  BEGINNING AFTER SURGERY   Polyvinyl Alcohol-Povidone (REFRESH OP), Place 1 drop into both eyes 2 (two) times daily.   Saw Palmetto 500 MG CAPS, Take 1,000 mg by mouth 2 (two) times daily.    Reviewed prior external information including notes and imaging from  primary care provider As well as notes that were available from care everywhere and other healthcare systems.  Past medical history, social, surgical and family history all reviewed in electronic medical record.  No pertanent information unless stated regarding to the chief complaint.   Review of Systems:  No headache, visual changes, nausea, vomiting, diarrhea, constipation, dizziness, abdominal pain, skin rash, fevers, chills, night sweats, weight loss, swollen lymph nodes, body aches, joint swelling, chest pain, shortness of breath, mood changes. POSITIVE muscle aches  Objective  Blood pressure (!) 150/70, pulse 63, height 5\' 5"  (1.651 m), weight 145 lb (65.8 kg), SpO2 96 %.   General: No apparent distress alert and oriented x3 mood and affect normal, dressed appropriately.  HEENT: Pupils equal, extraocular movements intact  Respiratory: Patient's speak in full sentences and does not appear short of breath  Cardiovascular: No lower extremity edema, non tender, no erythema  Gait overall normal Back exam shows the patient does have poor core strength.  Tenderness to palpation of the paraspinal musculature mostly only on the right side.  Seems to be in the L3-S1 area on the right side.  Some mildly worsening pain with extension.    Impression and Recommendations:     The above documentation has been reviewed and is accurate and complete , DO

## 2021-05-14 ENCOUNTER — Ambulatory Visit: Payer: Medicare Other | Admitting: Family Medicine

## 2021-05-14 ENCOUNTER — Encounter: Payer: Self-pay | Admitting: Family Medicine

## 2021-05-14 ENCOUNTER — Other Ambulatory Visit: Payer: Self-pay

## 2021-05-14 VITALS — BP 150/70 | HR 63 | Ht 65.0 in | Wt 145.0 lb

## 2021-05-14 DIAGNOSIS — M5431 Sciatica, right side: Secondary | ICD-10-CM

## 2021-05-14 DIAGNOSIS — M48062 Spinal stenosis, lumbar region with neurogenic claudication: Secondary | ICD-10-CM | POA: Diagnosis not present

## 2021-05-14 DIAGNOSIS — M47818 Spondylosis without myelopathy or radiculopathy, sacral and sacrococcygeal region: Secondary | ICD-10-CM

## 2021-05-14 DIAGNOSIS — G8929 Other chronic pain: Secondary | ICD-10-CM

## 2021-05-14 DIAGNOSIS — M545 Low back pain, unspecified: Secondary | ICD-10-CM

## 2021-05-14 NOTE — Assessment & Plan Note (Signed)
Patient has signs and symptoms more of the facet arthropathy at this point.  We will try injections at the L3-L4 and L4-L5 levels.  Patient did not respond well to the piriformis injection.  If this does not make improvement I would consider the possibility of a sacroiliac injection that we could do under ultrasound at follow-up.  Difficult to assess exactly what is giving him trouble because of the intermittent nature of this causing the pain.  Follow-up with me again 4 to 8 weeks.

## 2021-05-14 NOTE — Assessment & Plan Note (Signed)
Have attempted injection previously and will consider again at follow-up but patient has not made as much improvement with this and hopefully will improve with the injections.

## 2021-05-14 NOTE — Patient Instructions (Addendum)
Good to see you Call [facility] for injection [phone] Continue the exercises no changes See me 4 weeks after injection If continue pain we can try SI joint injection

## 2021-05-17 ENCOUNTER — Other Ambulatory Visit: Payer: Self-pay | Admitting: Family Medicine

## 2021-05-17 ENCOUNTER — Ambulatory Visit
Admission: RE | Admit: 2021-05-17 | Discharge: 2021-05-17 | Disposition: A | Discharge status: Home / Self Care | Payer: Medicare Other | Source: Ambulatory Visit | Attending: Family Medicine | Admitting: Family Medicine

## 2021-05-17 ENCOUNTER — Other Ambulatory Visit: Payer: Self-pay

## 2021-05-17 DIAGNOSIS — M545 Low back pain, unspecified: Secondary | ICD-10-CM

## 2021-05-17 DIAGNOSIS — G8929 Other chronic pain: Secondary | ICD-10-CM

## 2021-05-17 DIAGNOSIS — M47817 Spondylosis without myelopathy or radiculopathy, lumbosacral region: Secondary | ICD-10-CM | POA: Diagnosis not present

## 2021-05-17 MED ORDER — IOPAMIDOL (ISOVUE-M 200) INJECTION 41%
1.0000 mL | Freq: Once | INTRAMUSCULAR | Status: AC
  Administered 2021-05-17: 1 mL via INTRA_ARTICULAR

## 2021-05-17 MED ORDER — METHYLPREDNISOLONE ACETATE 40 MG/ML INJ SUSP (RADIOLOG
80.0000 mg | Freq: Once | INTRAMUSCULAR | Status: AC
  Administered 2021-05-17: 80 mg via INTRA_ARTICULAR

## 2021-05-17 NOTE — Discharge Instructions (Signed)
Post Procedure Spinal Discharge Instruction Sheet  You may resume a regular diet and any medications that you routinely take (including pain medications) unless otherwise noted by MD.  No driving day of procedure.  Light activity throughout the rest of the day.  Do not do any strenuous work, exercise, bending or lifting.  The day following the procedure, you can resume normal physical activity but you should refrain from exercising or physical therapy for at least three days thereafter.  You may apply ice to the injection site, 20 minutes on, 20 minutes off, as needed. Do not apply ice directly to skin.    Common Side Effects:  Headaches- take your usual medications as directed by your physician.  Increase your fluid intake.  Caffeinated beverages may be helpful.  Lie flat in bed until your headache resolves.  Restlessness or inability to sleep- you may have trouble sleeping for the next few days.  Ask your referring physician if you need any medication for sleep.  Facial flushing or redness- should subside within a few days.  Increased pain- a temporary increase in pain a day or two following your procedure is not unusual.  Take your pain medication as prescribed by your referring physician.  Leg cramps  Please contact our office at [phone] for the following symptoms: Fever greater than 100 degrees. Headaches unresolved with medication after 2-3 days. Increased swelling, pain, or redness at injection site.   Thank you for visiting [facility] today.    YOU MAY RESUME ASPIRIN ANY TIME AFTER INJECTION TODAY.

## 2021-05-22 ENCOUNTER — Ambulatory Visit (INDEPENDENT_AMBULATORY_CARE_PROVIDER_SITE_OTHER): Payer: Medicare Other

## 2021-05-22 ENCOUNTER — Other Ambulatory Visit: Payer: Self-pay

## 2021-05-22 ENCOUNTER — Ambulatory Visit: Payer: Medicare Other | Admitting: Podiatry

## 2021-05-22 DIAGNOSIS — M21619 Bunion of unspecified foot: Secondary | ICD-10-CM | POA: Diagnosis not present

## 2021-05-22 DIAGNOSIS — M21611 Bunion of right foot: Secondary | ICD-10-CM | POA: Diagnosis not present

## 2021-05-22 DIAGNOSIS — L84 Corns and callosities: Secondary | ICD-10-CM

## 2021-05-24 NOTE — Progress Notes (Signed)
Subjective:  Patient ID: [name], male    DOB: Aug 19, 1935,  MRN: [id number]  Chief Complaint  Patient presents with   Bunions    Right foot bunion    86 y.o. male presents with the above complaint.  Patient presents with right foot bunion deformity leading to pressure between the first and second digit creating heloma molle.  Patient is a painful to touch painful to walk on.  He had long time ago seen Dr. Al Corpus for treating the ulcer on the same toe.  He denies any other acute complaints.  He has not seen anyone else prior to seeing me me.  He would like to discuss treatment options for this.  He says it causes soreness.  He has not made any shoe gear modification either.  Review of Systems: Negative except as noted in the HPI. Denies N/V/F/Ch.  Past Medical History:  Diagnosis Date   Allergy    Arthritis    "scattered" (10/26/2018)   Asthma    C. difficile diarrhea    CAD (coronary artery disease) 2005   s/p PCI of RCA   Carotid artery stenosis    1-39% bilateral by dopplers 10/2016   Carotid stenosis    "? side; Dr. Edilia Bo"   Cataract    NS OD   Cognitive impairment, mild, so stated    Colitis    Erectile dysfunction    GERD (gastroesophageal reflux disease)    Hyperlipidemia    Hypertension    Perforated stomach, acute 10/23/2018   Peripheral vascular disease (HCC)    Retinal detachment    Vertebrobasilar artery syndrome    Vertigo     Current Outpatient Medications:    acetaminophen (TYLENOL) 500 MG tablet, Take 500 mg by mouth 2 (two) times daily., Disp: , Rfl:    albuterol (PROVENTIL) (2.5 MG/3ML) 0.083% nebulizer solution, Take 3 mLs (2.5 mg total) by nebulization every 6 (six) hours as needed for wheezing or shortness of breath., Disp: 90 vial, Rfl: 3   albuterol (VENTOLIN HFA) 108 (90 Base) MCG/ACT inhaler, INHALE 2 PUFFS INTO LUNGS EVERY 6 HOURS AS NEEDED FOR WHEEZING OR SHORTNESS OF BREATH., Disp: 8.5 g, Rfl: 3   amLODipine (NORVASC) 5 MG tablet, Take 1  tablet (5 mg total) by mouth daily., Disp: 90 tablet, Rfl: 3   aspirin EC 81 MG tablet, Take 81 mg by mouth daily., Disp: , Rfl:    atorvastatin (LIPITOR) 80 MG tablet, Take 1 tablet (80 mg total) by mouth daily., Disp: 90 tablet, Rfl: 3   azelastine (ASTELIN) 0.1 % nasal spray, Place 1 spray into both nostrils 2 (two) times daily., Disp: 30 mL, Rfl: 11   carvedilol (COREG) 6.25 MG tablet, Take 1 tablet (6.25 mg total) by mouth 2 (two) times daily with a meal., Disp: 180 tablet, Rfl: 3   cholecalciferol (VITAMIN D3) 25 MCG (1000 UT) tablet, Take 1,000 Units by mouth daily., Disp: , Rfl:    donepezil (ARICEPT) 10 MG tablet, Take 1 tablet (10 mg total) by mouth daily., Disp: 90 tablet, Rfl: 1   Eyelid Cleansers (OCUSOFT EYELID CLEANSING) PADS, Place 1 application into both eyes 2 (two) times daily., Disp: , Rfl:    fish oil-omega-3 fatty acids 1000 MG capsule, Take 2 g by mouth 2 (two) times daily. , Disp: , Rfl:    fluticasone (FLONASE) 50 MCG/ACT nasal spray, 1 spray each nostril twice daily, Disp: 16 g, Rfl: 11   fluticasone furoate-vilanterol (BREO ELLIPTA) 100-25 MCG/INH AEPB, INHALE 1  PUFF INTO THE LUNGS DAILY, Disp: 180 each, Rfl: 3   fluticasone furoate-vilanterol (BREO ELLIPTA) 100-25 MCG/INH AEPB, Inhale 1 puff into the lungs daily., Disp: 1 each, Rfl: 0   fluticasone furoate-vilanterol (BREO ELLIPTA) 100-25 MCG/INH AEPB, Inhale 1 puff into the lungs daily., Disp: 28 each, Rfl: 0   gabapentin (NEURONTIN) 100 MG capsule, Take 2 capsules (200 mg total) by mouth at bedtime., Disp: 180 capsule, Rfl: 1   gabapentin (NEURONTIN) 300 MG capsule, TAKE 1 CAPSULE(300 MG) BY MOUTH THREE TIMES DAILY, Disp: 90 capsule, Rfl: 3   hydrochlorothiazide (HYDRODIURIL) 25 MG tablet, Take 1 tablet (25 mg total) by mouth daily., Disp: 90 tablet, Rfl: 3   montelukast (SINGULAIR) 10 MG tablet, Take 1 tablet (10 mg total) by mouth at bedtime., Disp: 30 tablet, Rfl: 11   ofloxacin (OCUFLOX) 0.3 % ophthalmic solution,  INSTILL 1 DROP IN LEFT EYE FOUR TIMES DAILY FOR 7 DAYS BEGINNING AFTER SURGERY, Disp: , Rfl:    Polyvinyl Alcohol-Povidone (REFRESH OP), Place 1 drop into both eyes 2 (two) times daily., Disp: , Rfl:    Saw Palmetto 500 MG CAPS, Take 1,000 mg by mouth 2 (two) times daily. , Disp: , Rfl:   Social History   Tobacco Use  Smoking Status Former   Types: Cigars   Quit date: 05/13/1961   Years since quitting: 60.0  Smokeless Tobacco Never  Tobacco Comments   "never inhaled" in the early 60's     Allergies  Allergen Reactions   Neosporin [Bacitracin-Polymyxin B] Other (See Comments)    Redness   Objective:  There were no vitals filed for this visit. There is no height or weight on file to calculate BMI. Constitutional Well developed. Well nourished.  Vascular Dorsalis pedis pulses palpable bilaterally. Posterior tibial pulses palpable bilaterally. Capillary refill normal to all digits.  No cyanosis or clubbing noted. Pedal hair growth normal.  Neurologic Normal speech. Oriented to person, place, and time. Epicritic sensation to light touch grossly present bilaterally.  Dermatologic Nails well groomed and normal in appearance. No open wounds. No skin lesions.  Orthopedic: Normal joint ROM without pain or crepitus bilaterally. Hallux abductovalgus deformity present moderate with hallux valgus creating heloma molle to first and second interdigital space.  Hammertoe contracture of second digit noted crepitus noted in first MPJ with range of motion.  Limited range of motion noted. Left 1st MPJ diminished range of motion. Left 1st TMT without gross hypermobility. Right 1st MPJ diminished range of motion  Right 1st TMT without gross hypermobility. Lesser digital contractures present right second digit semiflexible in nature   Radiographs: Taken and reviewed. Hallux abductovalgus deformity present.  With concern for osteoarthritic change of the first metatarsophalangeal joint.  Tibial  sesamoid position 7 out of 7.  Hallux valgus deformity noted hammertoe contracture noted of the second digit  Assessment:   1. Bunion   2. Heloma molle    Plan:  Patient was evaluated and treated and all questions answered.  Hallux abductovalgus deformity, right with underlying second digit hammertoe contracture semiflexible -XR as above. -I explained the patient all conservative treatment options at this time.  I believe patient will benefit from a toe spacer to keep the spread adequate between the first and the second digit to decrease the heloma molle.  I discussed this with the patient in extensive detail he states understanding.  Also discussed shoe gear modification as well. -Toe spacer was dispensed, -If the spacers and conservative treatment options are not effective then we  will discuss doing surgical intervention.  Patient states understanding.  No follow-ups on file.

## 2021-05-27 ENCOUNTER — Telehealth: Payer: Self-pay

## 2021-05-27 NOTE — Chronic Care Management (AMB) (Signed)
Chronic Care Management Pharmacy Assistant   Name: Jarron Curley  MRN: [id number] DOB: 11-08-1934  [name] is an 86 y.o. year old male who presents for his initial CCM visit with the clinical pharmacist.   Reason for Encounter: Initial CCM Visit     Recent office visits:  05/03/21 Neena Rhymes, MD (PCP) - Family Medicine - Annual Exam - Labs were ordered. No Medication changes. Follow up in 6 months.   03/11/21 Neena Rhymes, MD (PCP) - Family Medicine - Cellulitis of Left Lower Leg - Cephalexin 500 mg 1 tablet three times daily for 7 days prescribed. Follow up as needed.   Recent consult visits:  05/22/21 Nicholes Rough, DPM - Podiatry - [place] - Xray ordered and completed of Right foot. Toe spacer was dispensed,  discussed shoe gear modification as well. No follow up indicated.   05/14/21 Antoine Primas, MD - Sports Medicine - Chronic Low Back Pain - RIGHT L3-4 FACET INJECTION UNDER FLUOROSCOPY and RIGHT L4-5 FACET INJECTION UNDER FLUOROSCOPY completed. Follow up in 4-8 weeks.   04/16/21 Antoine Primas, MD - Sports Medicine - Spinal Stenosis Lumbar Region - Right-sided piriformis injection done. Follow up in 4 weeks for possible epidural if no improvement.  04/08/21 Ames Dura, NP - Pulmonology - Mild asthma without complication - Continue fluticasone furoate-vilanterol (BREO ELLIPTA) 100-25 MCG/INH AEPB. Resumed Singulair 10 mg at bedtime and Astelin nasal spray OTC. Continue Flonase. Follow up in 1 year with Dr Tonia Brooms.  03/19/21 Altamese Cabal, MD - Otolaryngology - Right ear impaction - Right ear lavage performed. No medication changes. Follow up not indicated.   02/06/21 Doreatha Massed, PA-C - Vascular Surgery - Bilateral Extracranial Carotid Artery Stenosis - No medication changes. Follow up in 1 year for Carotid Duplex.  01/10/21 Antoine Primas, MD - Sports Medicine - Spinal Stenosis Lumbar Region - No medication changes. Follow up in 3 months.   Hospital visits:  None in  previous 6 months  Medications: Outpatient Encounter Medications as of 05/27/2021  Medication Sig   acetaminophen (TYLENOL) 500 MG tablet Take 500 mg by mouth 2 (two) times daily.   albuterol (PROVENTIL) (2.5 MG/3ML) 0.083% nebulizer solution Take 3 mLs (2.5 mg total) by nebulization every 6 (six) hours as needed for wheezing or shortness of breath.   albuterol (VENTOLIN HFA) 108 (90 Base) MCG/ACT inhaler INHALE 2 PUFFS INTO LUNGS EVERY 6 HOURS AS NEEDED FOR WHEEZING OR SHORTNESS OF BREATH.   amLODipine (NORVASC) 5 MG tablet Take 1 tablet (5 mg total) by mouth daily.   aspirin EC 81 MG tablet Take 81 mg by mouth daily.   atorvastatin (LIPITOR) 80 MG tablet Take 1 tablet (80 mg total) by mouth daily.   azelastine (ASTELIN) 0.1 % nasal spray Place 1 spray into both nostrils 2 (two) times daily.   carvedilol (COREG) 6.25 MG tablet Take 1 tablet (6.25 mg total) by mouth 2 (two) times daily with a meal.   cholecalciferol (VITAMIN D3) 25 MCG (1000 UT) tablet Take 1,000 Units by mouth daily.   donepezil (ARICEPT) 10 MG tablet Take 1 tablet (10 mg total) by mouth daily.   Eyelid Cleansers (OCUSOFT EYELID CLEANSING) PADS Place 1 application into both eyes 2 (two) times daily.   fish oil-omega-3 fatty acids 1000 MG capsule Take 2 g by mouth 2 (two) times daily.    fluticasone (FLONASE) 50 MCG/ACT nasal spray 1 spray each nostril twice daily   fluticasone furoate-vilanterol (BREO ELLIPTA) 100-25 MCG/INH AEPB INHALE 1 PUFF INTO THE  LUNGS DAILY   fluticasone furoate-vilanterol (BREO ELLIPTA) 100-25 MCG/INH AEPB Inhale 1 puff into the lungs daily.   fluticasone furoate-vilanterol (BREO ELLIPTA) 100-25 MCG/INH AEPB Inhale 1 puff into the lungs daily.   gabapentin (NEURONTIN) 100 MG capsule Take 2 capsules (200 mg total) by mouth at bedtime.   gabapentin (NEURONTIN) 300 MG capsule TAKE 1 CAPSULE(300 MG) BY MOUTH THREE TIMES DAILY   hydrochlorothiazide (HYDRODIURIL) 25 MG tablet Take 1 tablet (25 mg total) by  mouth daily.   montelukast (SINGULAIR) 10 MG tablet Take 1 tablet (10 mg total) by mouth at bedtime.   ofloxacin (OCUFLOX) 0.3 % ophthalmic solution INSTILL 1 DROP IN LEFT EYE FOUR TIMES DAILY FOR 7 DAYS BEGINNING AFTER SURGERY   Polyvinyl Alcohol-Povidone (REFRESH OP) Place 1 drop into both eyes 2 (two) times daily.   Saw Palmetto 500 MG CAPS Take 1,000 mg by mouth 2 (two) times daily.    No facility-administered encounter medications on file as of 05/27/2021.    Have you seen any other providers since your last visit?  Patient reports he has seen  Dr Allena Katz Podiatry and Dr Katrinka Bl[facility] since he saw Dr Beverely Low.  Any changes in your medications or health?  Patient denies any changes in his current health.   Any side effects from any medications?  Patient denies any side effects from his current medications.  Do you have an symptoms or problems not managed by your medications?  Patient denies any symptoms or problems with his current health.   Any concerns about your health right now?  Patient reports he is just concerned about if the epidural injections are going to help with his back pain. He has not experienced any relief at this time.   Has your provider asked that you check blood pressure, blood sugar, or follow special diet at home?  Patient denies being asked to do any remote monitoring at home or follow any special diets.   Do you get any type of exercise on a regular basis?  Patient reports he is not currently on an exercising regimen.   Can you think of a goal you would like to reach for your health?  Patient states he wishes to get his back pain under control.   Do you have any problems getting your medications?  Patient denies having any trouble getting his medications.   Is there anything that you would like to discuss during the appointment?  Patient is currently on Breo for asthma and he is concerned about the cost of this medication. He is able to pay for it  but he will be in the donut next month.   Please bring medications and supplements to appointment    Patient confirmed appointment day and time with CPP   Star Rating Drugs: atorvastatin (LIPITOR) 80 MG tablet - last filled 04/30/21 90 days    Eugenie Filler, CCMA Clinical Pharmacist Assistant  [phone]  Time Spent: 34 minutes

## 2021-05-28 NOTE — Progress Notes (Signed)
Chronic Care Management Pharmacy Note  05/29/2021 Name:  [name] MRN:  [id number] DOB:  Nov 19, 1934  Plan:  Subjective: [name] is an 86 y.o. year old male who is a primary patient of Tabori, Aundra Millet, MD.  The CCM team was consulted for assistance with disease management and care coordination needs.    Engaged with patient by telephone for initial visit in response to provider referral for pharmacy case management and/or care coordination services.   Consent to Services:  The patient was given information about Chronic Care Management services, agreed to services, and gave verbal consent prior to initiation of services.  Please see initial visit note for detailed documentation.   Patient Care Team: Midge Minium, MD as PCP - General Sueanne Margarita, MD as PCP - Cardiology (Cardiology) Sueanne Margarita, MD as Consulting Physician (Cardiology) Angelia Mould, MD as Consulting Physician (Vascular Surgery) Festus Aloe, MD as Consulting Physician (Urology) Gatha Mayer, MD as Consulting Physician (Gastroenterology) Garner Nash, DO as Consulting Physician (Pulmonary Disease) Chrystine Oiler, MD as Consulting Physician (Ophthalmology) Madelin Rear, [facility] as Pharmacist (Pharmacist)  Objective:  Lab Results  Component Value Date   CREATININE 1.20 05/03/2021   CREATININE 1.09 11/02/2020   CREATININE 1.06 05/07/2020    No results found for: HGBA1C Last diabetic Eye exam: No results found for: HMDIABEYEEXA  Last diabetic Foot exam: No results found for: HMDIABFOOTEX      Component Value Date/Time   CHOL 148 05/03/2021 1046   CHOL 89 (L) 09/27/2019 0810   TRIG 132.0 05/03/2021 1046   HDL 47.30 05/03/2021 1046   HDL 34 (L) 09/27/2019 0810   CHOLHDL 3 05/03/2021 1046   VLDL 26.4 05/03/2021 1046   LDLCALC 74 05/03/2021 1046   LDLCALC 59 11/02/2020 1428    Hepatic Function Latest Ref Rng & Units 05/03/2021 11/02/2020 05/07/2020  Total  Protein 6.0 - 8.3 g/dL 6.9 6.8 6.7  Albumin 3.5 - 5.2 g/dL 4.4 - 4.3  AST 0 - 37 U/L 24 32 21  ALT 0 - 53 U/L _0 Alk Phosphatase 39 - 117 U/L 76 - 82  Total Bilirubin 0.2 - 1.2 mg/dL 0.9 0.7 0.5  Bilirubin, Direct 0.0 - 0.3 mg/dL 0.1 0.2 0.1    Lab Results  Component Value Date/Time   TSH 3.51 05/03/2021 10:46 AM   TSH 3.30 11/02/2020 02:28 PM    CBC Latest Ref Rng & Units 05/03/2021 11/02/2020 05/07/2020  WBC 4.0 - 10.5 K/uL 7.5 8.4 6.0  Hemoglobin 13.0 - 17.0 g/dL 14.0 13.3 14.4  Hematocrit 39.0 - 52.0 % 42.0 39.8 42.0  Platelets 150.0 - 400.0 K/uL 169.0 217 187.0    No results found for: VD25OH  Social History   Tobacco Use  Smoking Status Former   Types: Cigars   Quit date: 05/13/1961   Years since quitting: 60.0  Smokeless Tobacco Never  Tobacco Comments   "never inhaled" in the early 60's    BP Readings from Last 3 Encounters:  05/17/21 (!) 148/64  05/14/21 (!) 150/70  05/03/21 122/80   Pulse Readings from Last 3 Encounters:  05/17/21 (!) 59  05/14/21 63  05/03/21 61   Wt Readings from Last 3 Encounters:  05/14/21 145 lb (65.8 kg)  05/03/21 144 lb 6.4 oz (65.5 kg)  04/16/21 146 lb (66.2 kg)    Assessment: Review of patient past medical history, allergies, medications, health status, including review of consultants reports, laboratory and other test  data, was performed as part of comprehensive evaluation and provision of chronic care management services.   SDOH:  (Social Determinants of Health) assessments and interventions performed: Yes   CCM Care Plan  Allergies  Allergen Reactions   Neosporin [Bacitracin-Polymyxin B] Other (See Comments)    Redness    Medications Reviewed Today     Reviewed by Madelin Rear, [facility] (Pharmacist) on 05/29/21 at 1243  Med List Status: <None>   Medication Order Taking? Sig Documenting Provider Last Dose Status Informant  acetaminophen (TYLENOL) 500 MG tablet [id number]  Take 500 mg by mouth 2 (two) times daily.  [provider]  Active Spouse/Significant Other  albuterol (PROVENTIL) (2.5 MG/3ML) 0.083% nebulizer solution [id number] Yes Take 3 mLs (2.5 mg total) by nebulization every 6 (six) hours as needed for wheezing or shortness of breath. Martyn Ehrich, NP Taking Active   albuterol (VENTOLIN HFA) 108 (90 Base) MCG/ACT inhaler [id number] Yes INHALE 2 PUFFS INTO LUNGS EVERY 6 HOURS AS NEEDED FOR WHEEZING OR SHORTNESS OF BREATH. Martyn Ehrich, NP Taking Active   amLODipine (NORVASC) 5 MG tablet [id number] Yes Take 1 tablet (5 mg total) by mouth daily. Sueanne Margarita, MD Taking Active   aspirin EC 81 MG tablet [id number] Yes Take 81 mg by mouth daily. [provider] Taking Active Spouse/Significant Other  atorvastatin (LIPITOR) 80 MG tablet [id number] Yes Take 1 tablet (80 mg total) by mouth daily. Sueanne Margarita, MD Taking Active   azelastine (ASTELIN) 0.1 % nasal spray [id number] Yes Place 1 spray into both nostrils 2 (two) times daily. Martyn Ehrich, NP Taking Active   carvedilol (COREG) 6.25 MG tablet [id number] Yes Take 1 tablet (6.25 mg total) by mouth 2 (two) times daily with a meal. Turner, Eber Hong, MD Taking Active   cholecalciferol (VITAMIN D3) 25 MCG (1000 UT) tablet [id number] Yes Take 1,000 Units by mouth daily. [provider] Taking Active   donepezil (ARICEPT) 10 MG tablet [id number] Yes Take 1 tablet (10 mg total) by mouth daily. Midge Minium, MD Taking Active   Eyelid Cleansers (OCUSOFT EYELID CLEANSING) PADS [id number]  Place 1 application into both eyes 2 (two) times daily. [provider]  Active Spouse/Significant Other  fish oil-omega-3 fatty acids 1000 MG capsule [id number] Yes Take 2 g by mouth 2 (two) times daily.  [provider] Taking Active Spouse/Significant Other  fluticasone (FLONASE) 50 MCG/ACT nasal spray [id number] Yes 1 spray each nostril twice daily Martyn Ehrich, NP Taking Active   fluticasone furoate-vilanterol  (BREO ELLIPTA) 100-25 MCG/INH AEPB [id number]  INHALE 1 PUFF INTO THE LUNGS DAILY Icard, Bradley L, DO  Active   fluticasone furoate-vilanterol (BREO ELLIPTA) 100-25 MCG/INH AEPB [id number]  Inhale 1 puff into the lungs daily. Martyn Ehrich, NP  Active   fluticasone furoate-vilanterol (BREO ELLIPTA) 100-25 MCG/INH AEPB [id number]  Inhale 1 puff into the lungs daily. Martyn Ehrich, NP  Active   gabapentin (NEURONTIN) 100 MG capsule [id number]  Take 2 capsules (200 mg total) by mouth at bedtime. Lyndal Pulley, DO  Active   gabapentin (NEURONTIN) 300 MG capsule [id number]  TAKE 1 CAPSULE(300 MG) BY MOUTH THREE TIMES DAILY Lyndal Pulley, DO  Active   hydrochlorothiazide (HYDRODIURIL) 25 MG tablet [id number]  Take 1 tablet (25 mg total) by mouth daily. Sueanne Margarita, MD  Active   montelukast (SINGULAIR) 10 MG tablet [id number]  Take 1 tablet (10 mg total) by mouth at bedtime. Martyn Ehrich, NP  Active  ofloxacin (OCUFLOX) 0.3 % ophthalmic solution [id number]  INSTILL 1 DROP IN LEFT EYE FOUR TIMES DAILY FOR 7 DAYS BEGINNING AFTER SURGERY [provider]  Active   Polyvinyl Alcohol-Povidone (REFRESH OP) [id number]  Place 1 drop into both eyes 2 (two) times daily. [provider]  Active Spouse/Significant Other  Saw Palmetto 500 MG CAPS [id number]  Take 1,000 mg by mouth 2 (two) times daily.  [provider]  Active Spouse/Significant Other            Patient Active Problem List   Diagnosis Date Noted   Conductive hearing loss of right ear with unrestricted hearing of left ear 03/19/2021   Piriformis syndrome of right side 08/07/2020   Chronic maxillary sinusitis 08/03/2020   Hamstring tendinitis of left thigh 03/20/2020   Nasal polyps 06/07/2019   Mild asthma 11/15/2018   Rhinitis, chronic 11/15/2018   History of ETT 10/23/2018   Globus sensation 07/29/2018   Preoperative cardiovascular examination 04/16/2018   Arthropathy of right sacroiliac joint  04/08/2016   Spinal stenosis, lumbar region, with neurogenic claudication 03/26/2016   Sciatica of right side 06/13/2015   CAD (coronary artery disease)    Rotator cuff syndrome of left shoulder 01/27/2013   Carotid artery stenosis 09/02/2012   Physical exam, annual 06/23/2012   Chronic cough 11/23/2009   SKIN LESIONS, MULTIPLE 06/05/2009   SYNDROME, 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   DISEASE, PERIPHERAL VASCULAR NOS 08/02/2007   VERTIGO 08/02/2007   Mild cognitive impairment 04/27/2007   Hyperlipidemia 03/17/2007   Essential hypertension 03/17/2007    Immunization History  Administered Date(s) Administered   Fluad Quad(high Dose 65+) 06/24/2019   Influenza Split 08/25/2011, 08/09/2012   Influenza Whole 07/11/2010   Influenza, High Dose Seasonal PF 08/14/2014, 07/16/2018   Influenza,inj,Quad PF,6+ Mos 08/11/2013, 08/18/2014, 07/11/2015, 07/14/2016, 07/20/2017   Influenza-Unspecified 07/24/2000   PFIZER(Purple Top)SARS-COV-2 Vaccination 11/15/2019, 12/06/2019, 07/27/2020, 03/08/2021   Pneumococcal Conjugate-13 01/25/2015   Pneumococcal Polysaccharide-23 09/15/2002   Td 12/13/2007, 05/31/2019   Zoster, Live 10/29/2010    Conditions to be addressed/monitored: HLD, CAD (PCI of RCA), HTN, mild asthma   Care Plan : ccm pharmacy care plan  Updates made by Madelin Rear, [place] since 05/29/2021 12:00 AM     Problem: CHL AMB "PATIENT-SPECIFIC PROBLEM"   Priority: High     Long-Range Goal: Patient-Specific Goal   Start Date: 05/29/2021  Expected End Date: 05/29/2022  This Visit's Progress: On track  Priority: High  Note:   Current Barriers:  Cost of current Breo inhaler  Pharmacist Clinical Goal(s):  Patient will verbalize ability to afford treatment regimen contact provider office for questions/concerns as evidenced notation of same in electronic health record through collaboration with PharmD and provider.   Interventions: 1:1 collaboration with Midge Minium, MD  regarding development and update of comprehensive plan of care as evidenced by provider attestation and co-signature Inter-disciplinary care team collaboration (see longitudinal plan of care) Comprehensive medication review performed; medication list updated in electronic medical record  Hypertension  (Status:New goal.)   Med Management Intervention: no changes  (BP goal <130/80) -Controlled -Current treatment: HCTZ 25 mg once daily  Carvedilol 6.25 mg twice daily Amlodipine 5 mg once daily  -Current home readings: not checking -Current exercise habits: exercise very limited due to back pain  -Denies hypotensive/hypertensive symptoms -Educated on Symptoms of hypotension and importance of maintaining adequate hydration; -Counseled to monitor BP at home as directed, document, and provide log at future appointments -Counseled on diet and exercise extensively Recommended to continue  current medication  Hyperlipidemia: (LDL goal <100 -Controlled -Current treatment: Atorvastatin 80 mg once daily -Medications previously tried: Zetia, Lovaza  -Educated on Cholesterol goals;  -Recommended to continue current medication  Asthma (Goal: control symptoms and prevent exacerbations) -Controlled -Concern with 90+ dollar copay while in donut hole  -Current treatment  Breo Ellipta 100-25 mcg/act 1 puff daily Ventolin - 2 puffs every 6 hours as needed for SOB -Medications previously tried: n/a  -Patient denies consistent use of maintenance inhaler -Counseled on Proper inhaler technique; Benefits of consistent maintenance inhaler use -Recommended to continue current medication Assessed patient finances. Does not qualify for PAP. Reviewed alternative ICS/LABA inhalers through insurance - [employer] would still be cheapest option for out of pocket spending both before and during coverage gap. Given this, I would suggest patient contact [facility]  (Toll Free [phone]) as they deal with multiple  assistance programs/plans. Another option would be for patient to apply to a foundation such as [employer] (currently wait list only for asthma) to receive financial support with medication copays  Patient Goals/Self-Care Activities Patient will:  - take medications as prescribed collaborate with provider on medication access solutions     Follow Up Plan:  CPA pt call two months to ensure coverage needs met before/plan for open enrollment. Rph f/u 6 months  Medication Assistance:  TBD  Patient's preferred pharmacy is:  [facility]) [place], [place] [address] Phone: [phone] Fax: [phone]  [id number] - [address], [address] [facility] [address] [place] [address] Phone: [phone] Fax: [phone]  Follow Up:  Patient agrees to Care Plan and Follow-up.  Future Appointments  Date Time Provider [place]  06/17/2021 10:30 AM Lyndal Pulley, DO [facility] None  06/19/2021 11:15 AM Felipa Furnace, DPM [facility]  11/01/2021 10:30 AM Midge Minium, MD [facility]  12/11/2021  1:00 PM [facility] CCM PHARMACIST [facility]

## 2021-05-29 ENCOUNTER — Ambulatory Visit (INDEPENDENT_AMBULATORY_CARE_PROVIDER_SITE_OTHER): Payer: Medicare Other

## 2021-05-29 DIAGNOSIS — J45909 Unspecified asthma, uncomplicated: Secondary | ICD-10-CM | POA: Diagnosis not present

## 2021-05-29 DIAGNOSIS — E7800 Pure hypercholesterolemia, unspecified: Secondary | ICD-10-CM

## 2021-05-29 DIAGNOSIS — I10 Essential (primary) hypertension: Secondary | ICD-10-CM

## 2021-05-29 NOTE — Patient Instructions (Signed)
Mr. Frate,  Thank you for talking with me today. I have included our care plan/goals in the following pages.   Please review and call me at [phone] with any questions.  Thanks! Ellin Mayhew, Pharm.D., BCGP Clinical Pharmacist [facility] [phone] Patient Care Plan: ccm pharmacy care plan     Problem Identified: CHL AMB "PATIENT-SPECIFIC PROBLEM"   Priority: High     Long-Range Goal: Patient-Specific Goal   Start Date: 05/29/2021  Expected End Date: 05/29/2022  This Visit's Progress: On track  Priority: High  Note:   Current Barriers:  Cost of current Breo inhaler  Pharmacist Clinical Goal(s):  Patient will verbalize ability to afford treatment regimen contact provider office for questions/concerns as evidenced notation of same in electronic health record through collaboration with PharmD and provider.   Interventions: 1:1 collaboration with Midge Minium, MD regarding development and update of comprehensive plan of care as evidenced by provider attestation and co-signature Inter-disciplinary care team collaboration (see longitudinal plan of care) Comprehensive medication review performed; medication list updated in electronic medical record  Hypertension  (Status:New goal.)   Med Management Intervention: no changes  (BP goal <130/80) -Controlled -Current treatment: HCTZ 25 mg once daily  Carvedilol 6.25 mg twice daily Amlodipine 5 mg once daily  -Current home readings: not checking -Current exercise habits: exercise very limited due to back pain  -Denies hypotensive/hypertensive symptoms -Educated on Symptoms of hypotension and importance of maintaining adequate hydration; -Counseled to monitor BP at home as directed, document, and provide log at future appointments -Counseled on diet and exercise extensively Recommended to continue current medication  Hyperlipidemia: (LDL goal  <100 -Controlled -Current treatment: Atorvastatin 80 mg once daily -Medications previously tried: Zetia, Lovaza  -Educated on Cholesterol goals;  -Recommended to continue current medication  Asthma (Goal: control symptoms and prevent exacerbations) -Controlled -Concern with 90+ dollar copay while in donut hole  -Current treatment  Breo Ellipta 100-25 mcg/act 1 puff daily Ventolin - 2 puffs every 6 hours as needed for SOB -Medications previously tried: n/a  -Patient denies consistent use of maintenance inhaler -Counseled on Proper inhaler technique; Benefits of consistent maintenance inhaler use -Recommended to continue current medication Assessed patient finances. Does not qualify for PAP. Reviewed alternative ICS/LABA inhalers through insurance - [employer] would still be cheapest option for out of pocket spending both before and during coverage gap. Given this, I would suggest patient contact [facility]  (Toll Free [phone]) as they deal with multiple assistance programs/plans. Another option would be for patient to apply to a foundation such as [employer] (currently wait list only for asthma) to receive financial support with medication copays  Patient Goals/Self-Care Activities Patient will:  - take medications as prescribed collaborate with provider on medication access solutions      The patient was given the following information about Chronic Care Management services today, agreed to services, and gave verbal consent: 1. CCM service includes personalized support from designated clinical staff supervised by the primary care provider, including individualized plan of care and coordination with other care providers 2. 24/7 contact phone numbers for assistance for urgent and routine care needs. 3. Service will only be billed when office clinical staff spend 20 minutes or more in a month to coordinate care. 4. Only one practitioner may furnish and bill the service in a calendar month.  5.The patient may stop CCM services at any time (effective at the end of the month) by phone  call to the office staff. 6. The patient will be responsible for cost sharing (co-pay) of up to 20% of the service fee (after annual deductible is met). Patient agreed to services and consent obtained.  The patient verbalized understanding of instructions provided today and agreed to receive a MyChart copy of patient instruction and/or educational materials. Telephone follow up appointment with pharmacy team member scheduled for: See next appointment with "Care Management Staff" under "What's Next" below. High Cholesterol  High cholesterol is a condition in which the blood has high levels of a white, waxy substance similar to fat (cholesterol). The liver makes all the cholesterol that the body needs. The human body needs small amounts of cholesterol to help build cells. A person gets extra orexcess cholesterol from the food that he or she eats. The blood carries cholesterol from the liver to the rest of the body. If you have high cholesterol, deposits (plaques) may build up on the walls of your arteries. Arteries are the blood vessels that carry blood away from your heart. These plaques make the arteries narrowand stiff. Cholesterol plaques increase your risk for heart attack and stroke. Work withyour health care provider to keep your cholesterol levels in a healthy range. What increases the risk? The following factors may make you more likely to develop this condition: Eating foods that are high in animal fat (saturated fat) or cholesterol. Being overweight. Not getting enough exercise. A family history of high cholesterol (familial hypercholesterolemia). Use of tobacco products. Having diabetes. What are the signs or symptoms? There are no symptoms of this condition. How is this diagnosed? This condition may be diagnosed based on the results of a blood test. If you are older than 84 years of age, your  health care provider may check your cholesterol levels every 4-6 years. You may be checked more often if you have high cholesterol or other risk factors for heart disease. The blood test for cholesterol measures: "Bad" cholesterol, or LDL cholesterol. This is the main type of cholesterol that causes heart disease. The desired level is less than 100 mg/dL. "Good" cholesterol, or HDL cholesterol. HDL helps protect against heart disease by cleaning the arteries and carrying the LDL to the liver for processing. The desired level for HDL is 60 mg/dL or higher. Triglycerides. These are fats that your body can store or burn for energy. The desired level is less than 150 mg/dL. Total cholesterol. This measures the total amount of cholesterol in your blood and includes LDL, HDL, and triglycerides. The desired level is less than 200 mg/dL. How is this treated? This condition may be treated with: Diet changes. You may be asked to eat foods that have more fiber and less saturated fats or added sugar. Lifestyle changes. These may include regular exercise, maintaining a healthy weight, and quitting use of tobacco products. Medicines. These are given when diet and lifestyle changes have not worked. You may be prescribed a statin medicine to help lower your cholesterol levels. Follow these instructions at home: Eating and drinking  Eat a healthy, balanced diet. This diet includes: Daily servings of a variety of fresh, frozen, or canned fruits and vegetables. Daily servings of whole grain foods that are rich in fiber. Foods that are low in saturated fats and trans fats. These include poultry and fish without skin, lean cuts of meat, and low-fat dairy products. A variety of fish, especially oily fish that contain omega-3 fatty acids. Aim to eat fish at least 2 times a week. Avoid  foods and drinks that have added sugar. Use healthy cooking methods, such as roasting, grilling, broiling, baking, poaching, steaming,  and stir-frying. Do not fry your food except for stir-frying.  Lifestyle  Get regular exercise. Aim to exercise for a total of 150 minutes a week. Increase your activity level by doing activities such as gardening, walking, and taking the stairs. Do not use any products that contain nicotine or tobacco, such as cigarettes, e-cigarettes, and chewing tobacco. If you need help quitting, ask your health care provider.  General instructions Take over-the-counter and prescription medicines only as told by your health care provider. Keep all follow-up visits as told by your health care provider. This is important. Where to find more information American Heart Association: www.heart.org National Heart, Lung, and Blood Institute: https://wilson-eaton.com/ Contact a health care provider if: You have trouble achieving or maintaining a healthy diet or weight. You are starting an exercise program. You are unable to stop smoking. Get help right away if: You have chest pain. You have trouble breathing. You have any symptoms of a stroke. "BE FAST" is an easy way to remember the main warning signs of a stroke: B - Balance. Signs are dizziness, sudden trouble walking, or loss of balance. E - Eyes. Signs are trouble seeing or a sudden change in vision. F - Face. Signs are sudden weakness or numbness of the face, or the face or eyelid drooping on one side. A - Arms. Signs are weakness or numbness in an arm. This happens suddenly and usually on one side of the body. S - Speech. Signs are sudden trouble speaking, slurred speech, or trouble understanding what people say. T - Time. Time to call emergency services. Write down what time symptoms started. You have other signs of a stroke, such as: A sudden, severe headache with no known cause. Nausea or vomiting. Seizure. These symptoms may represent a serious problem that is an emergency. Do not wait to see if the symptoms will go away. Get medical help right away.  Call your local emergency services (911 in the [place] Do not drive yourself to the hospital. Summary Cholesterol plaques increase your risk for heart attack and stroke. Work with your health care provider to keep your cholesterol levels in a healthy range. Eat a healthy, balanced diet, get regular exercise, and maintain a healthy weight. Do not use any products that contain nicotine or tobacco, such as cigarettes, e-cigarettes, and chewing tobacco. Get help right away if you have any symptoms of a stroke. This information is not intended to replace advice given to you by your health care provider. Make sure you discuss any questions you have with your healthcare provider. Document Revised: 09/12/2019 Document Reviewed: 09/12/2019 Elsevier Patient Education  2022 [employer].

## 2021-06-06 DIAGNOSIS — N400 Enlarged prostate without lower urinary tract symptoms: Secondary | ICD-10-CM | POA: Diagnosis not present

## 2021-06-14 ENCOUNTER — Telehealth: Payer: Self-pay | Admitting: Pulmonary Disease

## 2021-06-14 MED ORDER — FLUTICASONE FUROATE-VILANTEROL 100-25 MCG/INH IN AEPB: 1.0000 | INHALATION_SPRAY | Freq: Every day | RESPIRATORY_TRACT | 5 refills | Status: AC

## 2021-06-14 NOTE — Telephone Encounter (Signed)
Pt stated that he was informed by pharmacist that there is a generic brand of Breo and he is wanting to get a refill filled for the generic brand.  Pharmacy; [id number] - [place], [place] - [address] AT [facility] [facility] [address]  [address], [place] [address]   Pls regard; [phone]

## 2021-06-14 NOTE — Progress Notes (Signed)
[name] [address] [place] [address] Phone: [phone] Subjective:   [name], PhD, LAT, ATC acting as a scribe for [name], DO.  I'm seeing this patient by the request  of:  Sheliah Hatch, MD  CC: Low back pain follow-up  DJS:[id number]  05/14/2021 Patient has signs and symptoms more of the facet arthropathy at this point.  We will try injections at the L3-L4 and L4-L5 levels.  Patient did not respond well to the piriformis injection.  If this does not make improvement I would consider the possibility of a sacroiliac injection that we could do under ultrasound at follow-up.  Difficult to assess exactly what is giving him trouble because of the intermittent nature of this causing the pain.  Follow-up with me again 4 to 8 weeks.  Have attempted injection previously and will consider again at follow-up but patient has not made as much improvement with this and hopefully will improve with the injections  Update 06/17/2021 [name] is a 86 y.o. male coming in with complaint of back pain. Facet injections on 05/17/2021. Patient states back has been feeling pretty good, but he is still in pain and did not get much relief from the facet injection. Pt started wearing a R knee acupressure brace system this morning and notes some improvement in his pain. No numbness/tingling noted. Patient still states that it still hurts more when he is changing position.  Seems to go from a sitting to standing.  Once he gets moving it seems to get better again.  Recently   Spoke with the straight leg on the right side seems to be better as well.  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 Low back though MRI does show that patient does have moderate spinal stenosis with severe bilateral foraminal  narrowing at L2-L4.  Past Medical History:  Diagnosis Date   Allergy    Arthritis    "scattered" (10/26/2018)   Asthma    C. difficile diarrhea    CAD (coronary artery disease) 2005   s/p PCI of RCA   Carotid artery stenosis    1-39% bilateral by dopplers 10/2016   Carotid stenosis    "? side; Dr. Edilia Bo"   Cataract    NS OD   Cognitive impairment, mild, so stated    Colitis    Erectile dysfunction    GERD (gastroesophageal reflux disease)    Hyperlipidemia    Hypertension    Perforated stomach, acute 10/23/2018   Peripheral vascular disease (HCC)    Retinal detachment    Vertebrobasilar artery syndrome    Vertigo    Past Surgical History:  Procedure Laterality Date   APPENDECTOMY  1954   CATARACT EXTRACTION Left 08/08/2019   Dr. Hazle Quant   COLONOSCOPY     ESOPHAGOGASTRODUODENOSCOPY (EGD) WITH ESOPHAGEAL DILATION  09/2018   EYE SURGERY     LAPAROTOMY N/A 10/23/2018   Procedure: EXPLORATORY LAPAROTOMY WITH GRAHAM PATCH FOR PERFORATED ULCER AND PLACEMENT OF GASTRIC TUBE;  Surgeon: Emelia Loron, MD;  Location: [facility] OR;  Service: General;  Laterality: N/A;   NASAL SINUS SURGERY  05/2018   RETINAL DETACHMENT SURGERY     "? side"   UPPER GASTROINTESTINAL ENDOSCOPY     Social History   Socioeconomic History   Marital status: Married    Spouse  name: Not on file   Number of children: 3   Years of education: Not on file   Highest education level: Not on file  Occupational History   Occupation: Retired Company secretary Captain/[employer]    Employer: RETIRED  Tobacco Use   Smoking status: Former    Types: Cigars    Quit date: 05/13/1961    Years since quitting: 60.1   Smokeless tobacco: Never   Tobacco comments:    "never inhaled" in the early 60's   Vaping Use   Vaping Use: Never used  Substance and Sexual Activity   Alcohol use: Yes    Alcohol/week: 14.0 standard drinks    Types: 14 Glasses of wine per week   Drug use: Never   Sexual activity: Not Currently  Other  Topics Concern   Not on file  Social History Narrative   Married with 1 son and 2 daughters.  Retired.   2 caffeinated beverages daily, 3 glasses of wine daily   Never smoker no drug use no tobacco   Social Determinants of Corporate investment banker Strain: Low Risk    Difficulty of Paying Living Expenses: Not hard at all  Food Insecurity: No Food Insecurity   Worried About Programme researcher, broadcasting/film/video in the Last Year: Never true   Ran Out of Food in the Last Year: Never true  Transportation Needs: No Transportation Needs   Lack of Transportation (Medical): No   Lack of Transportation (Non-Medical): No  Physical Activity: Inactive   Days of Exercise per Week: 0 days   Minutes of Exercise per Session: 0 min  Stress: No Stress Concern Present   Feeling of Stress : Not at all  Social Connections: Moderately Integrated   Frequency of Communication with Friends and Family: More than three times a week   Frequency of Social Gatherings with Friends and Family: More than three times a week   Attends Religious Services: Never   Database administrator or Organizations: Yes   Attends Engineer, structural: More than 4 times per year   Marital Status: Married   Allergies  Allergen Reactions   Neosporin [Bacitracin-Polymyxin B] Other (See Comments)    Redness   Family History  Problem Relation Age of Onset   Stroke Mother    Hyperlipidemia Mother    Hypertension Mother    Deep vein thrombosis Mother    Stroke Father    Hyperlipidemia Father    Hypertension Father    Deep vein thrombosis Father    Diverticulitis Father    Ulcerative colitis Father    Colon polyps Father    Colon cancer Neg Hx    Esophageal cancer Neg Hx    Stomach cancer Neg Hx    Rectal cancer Neg Hx      Current Outpatient Medications (Cardiovascular):    amLODipine (NORVASC) 5 MG tablet, Take 1 tablet (5 mg total) by mouth daily.   atorvastatin (LIPITOR) 80 MG tablet, Take 1 tablet (80 mg total) by  mouth daily.   carvedilol (COREG) 6.25 MG tablet, Take 1 tablet (6.25 mg total) by mouth 2 (two) times daily with a meal.   hydrochlorothiazide (HYDRODIURIL) 25 MG tablet, Take 1 tablet (25 mg total) by mouth daily.  Current Outpatient Medications (Respiratory):    albuterol (PROVENTIL) (2.5 MG/3ML) 0.083% nebulizer solution, Take 3 mLs (2.5 mg total) by nebulization every 6 (six) hours as needed for wheezing or shortness of breath.   albuterol (VENTOLIN HFA) 108 (90  Base) MCG/ACT inhaler, INHALE 2 PUFFS INTO LUNGS EVERY 6 HOURS AS NEEDED FOR WHEEZING OR SHORTNESS OF BREATH.   azelastine (ASTELIN) 0.1 % nasal spray, Place 1 spray into both nostrils 2 (two) times daily.   fluticasone (FLONASE) 50 MCG/ACT nasal spray, 1 spray each nostril twice daily   fluticasone furoate-vilanterol (BREO ELLIPTA) 100-25 MCG/INH AEPB, INHALE 1 PUFF INTO THE LUNGS DAILY   fluticasone furoate-vilanterol (BREO ELLIPTA) 100-25 MCG/INH AEPB, Inhale 1 puff into the lungs daily.   fluticasone furoate-vilanterol (BREO ELLIPTA) 100-25 MCG/INH AEPB, Inhale 1 puff into the lungs daily.   montelukast (SINGULAIR) 10 MG tablet, Take 1 tablet (10 mg total) by mouth at bedtime.  Current Outpatient Medications (Analgesics):    acetaminophen (TYLENOL) 500 MG tablet, Take 500 mg by mouth 2 (two) times daily.   aspirin EC 81 MG tablet, Take 81 mg by mouth daily.   Current Outpatient Medications (Other):    cholecalciferol (VITAMIN D3) 25 MCG (1000 UT) tablet, Take 1,000 Units by mouth daily.   donepezil (ARICEPT) 10 MG tablet, Take 1 tablet (10 mg total) by mouth daily.   Eyelid Cleansers (OCUSOFT EYELID CLEANSING) PADS, Place 1 application into both eyes 2 (two) times daily.   fish oil-omega-3 fatty acids 1000 MG capsule, Take 2 g by mouth 2 (two) times daily.    gabapentin (NEURONTIN) 100 MG capsule, Take 2 capsules (200 mg total) by mouth at bedtime.   gabapentin (NEURONTIN) 300 MG capsule, TAKE 1 CAPSULE(300 MG) BY MOUTH  THREE TIMES DAILY   ofloxacin (OCUFLOX) 0.3 % ophthalmic solution, INSTILL 1 DROP IN LEFT EYE FOUR TIMES DAILY FOR 7 DAYS BEGINNING AFTER SURGERY   Polyvinyl Alcohol-Povidone (REFRESH OP), Place 1 drop into both eyes 2 (two) times daily.   Saw Palmetto 500 MG CAPS, Take 1,000 mg by mouth 2 (two) times daily.    Reviewed prior external information including notes and imaging from  primary care provider As well as notes that were available from care everywhere and other healthcare systems.  Past medical history, social, surgical and family history all reviewed in electronic medical record.  No pertanent information unless stated regarding to the chief complaint.   Review of Systems:  No headache, visual changes, nausea, vomiting, diarrhea, constipation, dizziness, abdominal pain, skin rash, fevers, chills, night sweats, weight loss, swollen lymph nodes, body aches, joint swelling, chest pain, shortness of breath, mood changes. POSITIVE muscle aches  Objective  Blood pressure 134/76, pulse 65, height 5\' 5"  (1.651 m), weight 143 lb (64.9 kg), SpO2 96 %.   General: No apparent distress alert and oriented x3 mood and affect normal, dressed appropriately.  HEENT: Pupils equal, extraocular movements intact  Respiratory: Patient's speak in full sentences and does not appear short of breath  Cardiovascular: No lower extremity edema, non tender, no erythema  Gait mild antalgic Patient low back exam definitely loss of lordosis.  Patient does have worsening pain with extension.  Some of the origin of the hamstring.    Impression and Recommendations:     The above documentation has been reviewed and is accurate and complete , DO

## 2021-06-14 NOTE — Telephone Encounter (Signed)
Call made to patient, confirmed DOB. Confirmed medication and pharmacy. Refill sent. Nothing further needed at this time.  

## 2021-06-17 ENCOUNTER — Ambulatory Visit: Payer: Medicare Other | Admitting: Family Medicine

## 2021-06-17 ENCOUNTER — Other Ambulatory Visit: Payer: Self-pay

## 2021-06-17 ENCOUNTER — Encounter: Payer: Self-pay | Admitting: Family Medicine

## 2021-06-17 DIAGNOSIS — M76892 Other specified enthesopathies of left lower limb, excluding foot: Secondary | ICD-10-CM | POA: Diagnosis not present

## 2021-06-17 NOTE — Assessment & Plan Note (Signed)
Concerned that this could be more secondary to the hamstring at this point.  We will consider possible injection if this does not make improvement.  Patient will continue to monitor.  X-rays as well as advanced imaging has shown calcific changes.  Follow-up with me again in 4 to 6 weeks.  Total time reviewing patient's imaging as well as discussing with patient today 33 minutes.

## 2021-06-17 NOTE — Patient Instructions (Signed)
Good to see you  Keep getting out of the chair the way you showed me  Wearing the brace is OK, but loosen it if you begin to get weakness in the foot  If you are still having pain, we may try hamstring steroid injection.  See me again in 4-6

## 2021-06-19 ENCOUNTER — Ambulatory Visit: Payer: Medicare Other | Admitting: Podiatry

## 2021-06-19 ENCOUNTER — Other Ambulatory Visit: Payer: Self-pay

## 2021-06-19 DIAGNOSIS — L84 Corns and callosities: Secondary | ICD-10-CM | POA: Diagnosis not present

## 2021-06-19 DIAGNOSIS — M21619 Bunion of unspecified foot: Secondary | ICD-10-CM | POA: Diagnosis not present

## 2021-06-20 NOTE — Progress Notes (Signed)
Subjective:  Patient ID: [name], male    DOB: 21-Feb-1935,  MRN: [id number]  Chief Complaint  Patient presents with   Bunions    Right foot PT stated that he is doing great he feels like his toe is doing well     86 y.o. male presents with the above complaint.  Patient presents with a follow-up of right first and second digit heloma molle.  Patient states is doing a lot better the spacers and shoe gear modification has helped considerably.  The ulcers have also healed up.  He denies any other acute complaints  Review of Systems: Negative except as noted in the HPI. Denies N/V/F/Ch.  Past Medical History:  Diagnosis Date   Allergy    Arthritis    "scattered" (10/26/2018)   Asthma    C. difficile diarrhea    CAD (coronary artery disease) 2005   s/p PCI of RCA   Carotid artery stenosis    1-39% bilateral by dopplers 10/2016   Carotid stenosis    "? side; Dr. Edilia Bo"   Cataract    NS OD   Cognitive impairment, mild, so stated    Colitis    Erectile dysfunction    GERD (gastroesophageal reflux disease)    Hyperlipidemia    Hypertension    Perforated stomach, acute 10/23/2018   Peripheral vascular disease (HCC)    Retinal detachment    Vertebrobasilar artery syndrome    Vertigo     Current Outpatient Medications:    acetaminophen (TYLENOL) 500 MG tablet, Take 500 mg by mouth 2 (two) times daily., Disp: , Rfl:    albuterol (PROVENTIL) (2.5 MG/3ML) 0.083% nebulizer solution, Take 3 mLs (2.5 mg total) by nebulization every 6 (six) hours as needed for wheezing or shortness of breath., Disp: 90 vial, Rfl: 3   albuterol (VENTOLIN HFA) 108 (90 Base) MCG/ACT inhaler, INHALE 2 PUFFS INTO LUNGS EVERY 6 HOURS AS NEEDED FOR WHEEZING OR SHORTNESS OF BREATH., Disp: 8.5 g, Rfl: 3   amLODipine (NORVASC) 5 MG tablet, Take 1 tablet (5 mg total) by mouth daily., Disp: 90 tablet, Rfl: 3   aspirin EC 81 MG tablet, Take 81 mg by mouth daily., Disp: , Rfl:    atorvastatin (LIPITOR) 80 MG  tablet, Take 1 tablet (80 mg total) by mouth daily., Disp: 90 tablet, Rfl: 3   azelastine (ASTELIN) 0.1 % nasal spray, Place 1 spray into both nostrils 2 (two) times daily., Disp: 30 mL, Rfl: 11   carvedilol (COREG) 6.25 MG tablet, Take 1 tablet (6.25 mg total) by mouth 2 (two) times daily with a meal., Disp: 180 tablet, Rfl: 3   cholecalciferol (VITAMIN D3) 25 MCG (1000 UT) tablet, Take 1,000 Units by mouth daily., Disp: , Rfl:    donepezil (ARICEPT) 10 MG tablet, Take 1 tablet (10 mg total) by mouth daily., Disp: 90 tablet, Rfl: 1   Eyelid Cleansers (OCUSOFT EYELID CLEANSING) PADS, Place 1 application into both eyes 2 (two) times daily., Disp: , Rfl:    fish oil-omega-3 fatty acids 1000 MG capsule, Take 2 g by mouth 2 (two) times daily. , Disp: , Rfl:    fluticasone (FLONASE) 50 MCG/ACT nasal spray, 1 spray each nostril twice daily, Disp: 16 g, Rfl: 11   fluticasone furoate-vilanterol (BREO ELLIPTA) 100-25 MCG/INH AEPB, INHALE 1 PUFF INTO THE LUNGS DAILY, Disp: 180 each, Rfl: 3   fluticasone furoate-vilanterol (BREO ELLIPTA) 100-25 MCG/INH AEPB, Inhale 1 puff into the lungs daily., Disp: 28 each, Rfl: 0  fluticasone furoate-vilanterol (BREO ELLIPTA) 100-25 MCG/INH AEPB, Inhale 1 puff into the lungs daily., Disp: 60 each, Rfl: 5   gabapentin (NEURONTIN) 100 MG capsule, Take 2 capsules (200 mg total) by mouth at bedtime., Disp: 180 capsule, Rfl: 1   gabapentin (NEURONTIN) 300 MG capsule, TAKE 1 CAPSULE(300 MG) BY MOUTH THREE TIMES DAILY, Disp: 90 capsule, Rfl: 3   hydrochlorothiazide (HYDRODIURIL) 25 MG tablet, Take 1 tablet (25 mg total) by mouth daily., Disp: 90 tablet, Rfl: 3   montelukast (SINGULAIR) 10 MG tablet, Take 1 tablet (10 mg total) by mouth at bedtime., Disp: 30 tablet, Rfl: 11   ofloxacin (OCUFLOX) 0.3 % ophthalmic solution, INSTILL 1 DROP IN LEFT EYE FOUR TIMES DAILY FOR 7 DAYS BEGINNING AFTER SURGERY, Disp: , Rfl:    Polyvinyl Alcohol-Povidone (REFRESH OP), Place 1 drop into both  eyes 2 (two) times daily., Disp: , Rfl:    Saw Palmetto 500 MG CAPS, Take 1,000 mg by mouth 2 (two) times daily. , Disp: , Rfl:   Social History   Tobacco Use  Smoking Status Former   Types: Cigars   Quit date: 05/13/1961   Years since quitting: 60.1  Smokeless Tobacco Never  Tobacco Comments   "never inhaled" in the early 60's     Allergies  Allergen Reactions   Neosporin [Bacitracin-Polymyxin B] Other (See Comments)    Redness   Objective:  There were no vitals filed for this visit. There is no height or weight on file to calculate BMI. Constitutional Well developed. Well nourished.  Vascular Dorsalis pedis pulses palpable bilaterally. Posterior tibial pulses palpable bilaterally. Capillary refill normal to all digits.  No cyanosis or clubbing noted. Pedal hair growth normal.  Neurologic Normal speech. Oriented to person, place, and time. Epicritic sensation to light touch grossly present bilaterally.  Dermatologic Nails well groomed and normal in appearance. No open wounds. No skin lesions.  Orthopedic: Normal joint ROM without pain or crepitus bilaterally. Hallux abductovalgus deformity present moderate with hallux valgus creating heloma molle to first and second interdigital space.  Hammertoe contracture of second digit noted crepitus noted in first MPJ with range of motion.  Limited range of motion noted. Left 1st MPJ diminished range of motion. Left 1st TMT without gross hypermobility. Right 1st MPJ diminished range of motion  Right 1st TMT without gross hypermobility. Lesser digital contractures present right second digit semiflexible in nature   Radiographs: Taken and reviewed. Hallux abductovalgus deformity present.  With concern for osteoarthritic change of the first metatarsophalangeal joint.  Tibial sesamoid position 7 out of 7.  Hallux valgus deformity noted hammertoe contracture noted of the second digit  Assessment:   No diagnosis found.  Plan:   Patient was evaluated and treated and all questions answered.  Hallux abductovalgus deformity, right with underlying second digit hammertoe contracture semiflexible -XR as above. -Clinically healed with a spacer and shoe gear modification.  I again reiterated the importance of it.  At this time patient is heloma molle has completely resolved.  I encouraged him to continue doing the necessary changes to take the pressure away.  Patient states understanding.  He is still a high risk of redeveloping a little more limited given the nature of the contractures that are present. -I will see him back as needed if any foot and ankle issues arise in future.  No follow-ups on file.

## 2021-06-21 DIAGNOSIS — H18593 Other hereditary corneal dystrophies, bilateral: Secondary | ICD-10-CM | POA: Diagnosis not present

## 2021-06-21 DIAGNOSIS — H5203 Hypermetropia, bilateral: Secondary | ICD-10-CM | POA: Diagnosis not present

## 2021-06-21 DIAGNOSIS — H21562 Pupillary abnormality, left eye: Secondary | ICD-10-CM | POA: Diagnosis not present

## 2021-06-21 DIAGNOSIS — H52223 Regular astigmatism, bilateral: Secondary | ICD-10-CM | POA: Diagnosis not present

## 2021-06-24 ENCOUNTER — Other Ambulatory Visit: Payer: Self-pay

## 2021-06-24 DIAGNOSIS — I10 Essential (primary) hypertension: Secondary | ICD-10-CM

## 2021-06-24 DIAGNOSIS — G3184 Mild cognitive impairment, so stated: Secondary | ICD-10-CM

## 2021-06-24 MED ORDER — DONEPEZIL HCL 10 MG PO TABS: 10.0000 mg | ORAL_TABLET | Freq: Every day | ORAL | 1 refills | Status: DC

## 2021-06-27 ENCOUNTER — Telehealth: Payer: Self-pay | Admitting: *Deleted

## 2021-06-27 ENCOUNTER — Telehealth: Payer: Self-pay | Admitting: Pulmonary Disease

## 2021-06-27 DIAGNOSIS — G3184 Mild cognitive impairment, so stated: Secondary | ICD-10-CM

## 2021-06-27 MED ORDER — AZELASTINE HCL 0.1 % NA SOLN: 1.0000 | Freq: Two times a day (BID) | NASAL | 5 refills | Status: DC

## 2021-06-27 MED ORDER — DONEPEZIL HCL 10 MG PO TABS: 10.0000 mg | ORAL_TABLET | Freq: Every day | ORAL | 1 refills | Status: DC

## 2021-06-27 NOTE — Telephone Encounter (Signed)
[name] called and stated that [facility] sent him a text stating they had been attempting to contact our office to have donepezil 10mg  tablet refilled. I let [name] know that rx was sent and confirmed on 06/24/2021 at 11:44am. He would like rx to be resent because they are stating it was not received. Rx was resent by me and patient made aware. I asked him to call 06/26/2021 if there were further issues.

## 2021-06-27 NOTE — Telephone Encounter (Signed)
Pt came into the clinic today and stated that he is needing a refill for the medication for Azelastine (Astelin) 0.1 % nasal spray, however it keeps getting denied at his pharmacy when he is requesting a refill but it is stating that it needs a provider approval. Pt last OV was with NP Clent Ridges on 04/08/2021.   Pharmacy; [facility]; [address], [place], [place] [address]  Pls regard; [phone]

## 2021-06-27 NOTE — Telephone Encounter (Signed)
Called and spoke with patient. He stated that [facility] had told him the RX for Astelin had been denied. I reviewed his chart and saw where the last RX had been sent to the mail order pharmacy. He stated that he wishes to have it sent to the local pharmacy.   RX has been sent. Nothing further needed at time of call.

## 2021-07-15 DIAGNOSIS — D485 Neoplasm of uncertain behavior of skin: Secondary | ICD-10-CM | POA: Diagnosis not present

## 2021-07-15 DIAGNOSIS — D0461 Carcinoma in situ of skin of right upper limb, including shoulder: Secondary | ICD-10-CM | POA: Diagnosis not present

## 2021-07-15 DIAGNOSIS — L821 Other seborrheic keratosis: Secondary | ICD-10-CM | POA: Diagnosis not present

## 2021-07-15 DIAGNOSIS — L820 Inflamed seborrheic keratosis: Secondary | ICD-10-CM | POA: Diagnosis not present

## 2021-07-15 DIAGNOSIS — L570 Actinic keratosis: Secondary | ICD-10-CM | POA: Diagnosis not present

## 2021-07-16 ENCOUNTER — Ambulatory Visit: Payer: Medicare Other | Admitting: Family Medicine

## 2021-07-18 ENCOUNTER — Ambulatory Visit: Payer: Medicare Other | Admitting: Family Medicine

## 2021-07-19 NOTE — Progress Notes (Signed)
[name] [address] [place] [address] Phone: [phone] Subjective:   [name], am serving as a scribe for Dr. Antoine Primas. This visit occurred during the SARS-CoV-2 public health emergency.  Safety protocols were in place, including screening questions prior to the visit, additional usage of staff PPE, and extensive cleaning of exam room while observing appropriate contact time as indicated for disinfecting solutions.   I'm seeing this patient by the request  of:  Sheliah Hatch, MD  CC: Right low back pain  YHC:[id number]  06/17/2021 Concerned that this could be more secondary to the hamstring at this point.  We will consider possible injection if this does not make improvement.  Patient will continue to monitor.  X-rays as well as advanced imaging has shown calcific changes.  Follow-up with me again in 4 to 6 weeks.  Total time reviewing patient's imaging as well as discussing with patient today 33 minutes.  Update 07/22/2021 [name] is a 86 y.o. male coming in with complaint of right low back pain. States that he feels the pain all the time, but is worse once he goes from flexion to extension. No shooting pain, just stays over the right upper gluteal region.      Past Medical History:  Diagnosis Date   Allergy    Arthritis    "scattered" (10/26/2018)   Asthma    C. difficile diarrhea    CAD (coronary artery disease) 2005   s/p PCI of RCA   Carotid artery stenosis    1-39% bilateral by dopplers 10/2016   Carotid stenosis    "? side; Dr. Edilia Bo"   Cataract    NS OD   Cognitive impairment, mild, so stated    Colitis    Erectile dysfunction    GERD (gastroesophageal reflux disease)    Hyperlipidemia    Hypertension    Perforated stomach, acute 10/23/2018   Peripheral vascular disease (HCC)    Retinal detachment    Vertebrobasilar artery syndrome    Vertigo    Past Surgical History:  Procedure Laterality Date    APPENDECTOMY  1954   CATARACT EXTRACTION Left 08/08/2019   Dr. Hazle Quant   COLONOSCOPY     ESOPHAGOGASTRODUODENOSCOPY (EGD) WITH ESOPHAGEAL DILATION  09/2018   EYE SURGERY     LAPAROTOMY N/A 10/23/2018   Procedure: EXPLORATORY LAPAROTOMY WITH GRAHAM PATCH FOR PERFORATED ULCER AND PLACEMENT OF GASTRIC TUBE;  Surgeon: Emelia Loron, MD;  Location: [facility] OR;  Service: General;  Laterality: N/A;   NASAL SINUS SURGERY  05/2018   RETINAL DETACHMENT SURGERY     "? side"   UPPER GASTROINTESTINAL ENDOSCOPY     Social History   Socioeconomic History   Marital status: Married    Spouse name: Not on file   Number of children: 3   Years of education: Not on file   Highest education level: Not on file  Occupational History   Occupation: Retired Company secretary Captain/[employer]    Employer: RETIRED  Tobacco Use   Smoking status: Former    Types: Cigars    Quit date: 05/13/1961    Years since quitting: 60.2   Smokeless tobacco: Never   Tobacco comments:    "never inhaled" in the early 60's   Vaping Use   Vaping Use: Never used  Substance and Sexual Activity   Alcohol use: Yes    Alcohol/week: 14.0 standard drinks    Types: 14 Glasses of wine per week  Drug use: Never   Sexual activity: Not Currently  Other Topics Concern   Not on file  Social History Narrative   Married with 1 son and 2 daughters.  Retired.   2 caffeinated beverages daily, 3 glasses of wine daily   Never smoker no drug use no tobacco   Social Determinants of Corporate investment banker Strain: Low Risk    Difficulty of Paying Living Expenses: Not hard at all  Food Insecurity: No Food Insecurity   Worried About Programme researcher, broadcasting/film/video in the Last Year: Never true   Ran Out of Food in the Last Year: Never true  Transportation Needs: No Transportation Needs   Lack of Transportation (Medical): No   Lack of Transportation (Non-Medical): No  Physical Activity: Inactive   Days of Exercise per Week: 0 days   Minutes of Exercise  per Session: 0 min  Stress: No Stress Concern Present   Feeling of Stress : Not at all  Social Connections: Moderately Integrated   Frequency of Communication with Friends and Family: More than three times a week   Frequency of Social Gatherings with Friends and Family: More than three times a week   Attends Religious Services: Never   Database administrator or Organizations: Yes   Attends Engineer, structural: More than 4 times per year   Marital Status: Married   Allergies  Allergen Reactions   Neosporin [Bacitracin-Polymyxin B] Other (See Comments)    Redness   Family History  Problem Relation Age of Onset   Stroke Mother    Hyperlipidemia Mother    Hypertension Mother    Deep vein thrombosis Mother    Stroke Father    Hyperlipidemia Father    Hypertension Father    Deep vein thrombosis Father    Diverticulitis Father    Ulcerative colitis Father    Colon polyps Father    Colon cancer Neg Hx    Esophageal cancer Neg Hx    Stomach cancer Neg Hx    Rectal cancer Neg Hx      Current Outpatient Medications (Cardiovascular):    amLODipine (NORVASC) 5 MG tablet, Take 1 tablet (5 mg total) by mouth daily.   atorvastatin (LIPITOR) 80 MG tablet, Take 1 tablet (80 mg total) by mouth daily.   carvedilol (COREG) 6.25 MG tablet, Take 1 tablet (6.25 mg total) by mouth 2 (two) times daily with a meal.   hydrochlorothiazide (HYDRODIURIL) 25 MG tablet, Take 1 tablet (25 mg total) by mouth daily.  Current Outpatient Medications (Respiratory):    albuterol (PROVENTIL) (2.5 MG/3ML) 0.083% nebulizer solution, Take 3 mLs (2.5 mg total) by nebulization every 6 (six) hours as needed for wheezing or shortness of breath.   albuterol (VENTOLIN HFA) 108 (90 Base) MCG/ACT inhaler, INHALE 2 PUFFS INTO LUNGS EVERY 6 HOURS AS NEEDED FOR WHEEZING OR SHORTNESS OF BREATH.   azelastine (ASTELIN) 0.1 % nasal spray, Place 1 spray into both nostrils 2 (two) times daily.   fluticasone (FLONASE) 50  MCG/ACT nasal spray, 1 spray each nostril twice daily   fluticasone furoate-vilanterol (BREO ELLIPTA) 100-25 MCG/INH AEPB, INHALE 1 PUFF INTO THE LUNGS DAILY   fluticasone furoate-vilanterol (BREO ELLIPTA) 100-25 MCG/INH AEPB, Inhale 1 puff into the lungs daily.   montelukast (SINGULAIR) 10 MG tablet, Take 1 tablet (10 mg total) by mouth at bedtime.  Current Outpatient Medications (Analgesics):    acetaminophen (TYLENOL) 500 MG tablet, Take 500 mg by mouth 2 (two) times daily.  aspirin EC 81 MG tablet, Take 81 mg by mouth daily.   Current Outpatient Medications (Other):    cholecalciferol (VITAMIN D3) 25 MCG (1000 UT) tablet, Take 1,000 Units by mouth daily.   donepezil (ARICEPT) 10 MG tablet, Take 1 tablet (10 mg total) by mouth daily.   Eyelid Cleansers (OCUSOFT EYELID CLEANSING) PADS, Place 1 application into both eyes 2 (two) times daily.   fish oil-omega-3 fatty acids 1000 MG capsule, Take 2 g by mouth 2 (two) times daily.    gabapentin (NEURONTIN) 100 MG capsule, Take 2 capsules (200 mg total) by mouth at bedtime.   gabapentin (NEURONTIN) 300 MG capsule, TAKE 1 CAPSULE(300 MG) BY MOUTH THREE TIMES DAILY   ofloxacin (OCUFLOX) 0.3 % ophthalmic solution, INSTILL 1 DROP IN LEFT EYE FOUR TIMES DAILY FOR 7 DAYS BEGINNING AFTER SURGERY   Polyvinyl Alcohol-Povidone (REFRESH OP), Place 1 drop into both eyes 2 (two) times daily.   Saw Palmetto 500 MG CAPS, Take 1,000 mg by mouth 2 (two) times daily.    Reviewed prior external information including notes and imaging from  primary care provider As well as notes that were available from care everywhere and other healthcare systems.  Past medical history, social, surgical and family history all reviewed in electronic medical record.  No pertanent information unless stated regarding to the chief complaint.   Review of Systems:  No headache, visual changes, nausea, vomiting, diarrhea, constipation, dizziness, abdominal pain, skin rash, fevers,  chills, night sweats, weight loss, swollen lymph nodes, body aches, joint swelling, chest pain, shortness of breath, mood changes. POSITIVE muscle aches  Objective  Blood pressure (!) 140/56, pulse 67, height 5\' 5"  (1.651 m), weight 144 lb (65.3 kg), SpO2 95 %.   General: No apparent distress alert and oriented x3 mood and affect normal, dressed appropriately.  HEENT: Pupils equal, extraocular movements intact  Respiratory: Patient's speak in full sentences and does not appear short of breath  Cardiovascular: No lower extremity edema, non tender, no erythema  Gait normal with good balance and coordination.  MSK: Significant loss of lordosis of the lumbar spine.  Patient does have atrophy of the lower extremity bilaterally.  Poor core strength noted.  Tightness with FABER test.  Mild tenderness noted of the hamstring bilaterally right greater than left.  More tenderness over the right sacroiliac joint unusual.  After verbal consent patient was prepped with alcohol swab and with a 21-gauge 2 inch needle injected into the right sacroiliac joint with a total of 1 cc of 0.5% Marcaine and 1 cc of Kenalog 40 mg/mL.  No blood loss.  Band-Aid placed.  Postinjection instructions given.    Impression and Recommendations:    The above documentation has been reviewed and is accurate and complete , DO

## 2021-07-22 ENCOUNTER — Other Ambulatory Visit: Payer: Self-pay

## 2021-07-22 ENCOUNTER — Ambulatory Visit: Payer: Medicare Other | Admitting: Family Medicine

## 2021-07-22 DIAGNOSIS — M47818 Spondylosis without myelopathy or radiculopathy, sacral and sacrococcygeal region: Secondary | ICD-10-CM | POA: Diagnosis not present

## 2021-07-22 NOTE — Patient Instructions (Addendum)
Continue exercise Lets see how you respond to injection Ice when necessary See you again in 2 months

## 2021-08-01 ENCOUNTER — Other Ambulatory Visit: Payer: Self-pay | Admitting: *Deleted

## 2021-08-01 DIAGNOSIS — D0461 Carcinoma in situ of skin of right upper limb, including shoulder: Secondary | ICD-10-CM | POA: Diagnosis not present

## 2021-08-01 MED ORDER — FLUTICASONE FUROATE-VILANTEROL 100-25 MCG/INH IN AEPB: INHALATION_SPRAY | RESPIRATORY_TRACT | 3 refills | Status: DC

## 2021-08-14 ENCOUNTER — Telehealth: Payer: Self-pay | Admitting: Pharmacy Technician

## 2021-08-14 ENCOUNTER — Other Ambulatory Visit (HOSPITAL_COMMUNITY): Payer: Self-pay

## 2021-08-14 NOTE — Telephone Encounter (Signed)
Patient Advocate Encounter  Received notification from [employer] that prior authorization for BREO is required.   PA submitted on 10.19.22 Key [id number] Status is pending   [facility] will continue to follow  Ricke Hey, CPhT Patient Advocate Phone: [phone] Fax:  [phone]

## 2021-08-15 ENCOUNTER — Other Ambulatory Visit (HOSPITAL_COMMUNITY): Payer: Self-pay

## 2021-08-15 NOTE — Telephone Encounter (Addendum)
[facility] Patient Advocate Encounter  Prior Authorization for Breo (Fluticasone Furoate-Vilanterol 100-25MCG/ACT) has been approved.    PA# no pa # given Effective dates: 08/14/21 through 08/14/2022  Patients co-pay is $37/month or $111/90 days.   Spoke with [employer]. They will need to order, but were able to process and will f/u with the pt.   Sherilyn Dacosta, CPhT Patient Advocate [facility] Phone: [phone] Fax:  [phone]

## 2021-08-17 ENCOUNTER — Other Ambulatory Visit: Payer: Self-pay | Admitting: Primary Care

## 2021-09-13 NOTE — Progress Notes (Signed)
[facility] [place] [address] [place] Phone: [phone] Subjective:   [name], am serving as a scribe for Dr. Hulan Saas. This visit occurred during the SARS-CoV-2 public health emergency.  Safety protocols were in place, including screening questions prior to the visit, additional usage of staff PPE, and extensive cleaning of exam room while observing appropriate contact time as indicated for disinfecting solutions.   I'm seeing this patient by the request  of:  Midge Minium, MD  CC: Right hip pain  [id number]  [name] is a 86 y.o. male coming in with complaint of R SI joint pain. Injected on 07/22/2021. Patient states pain in the same spot, same pain. Hurts when standing up and when going from back flexion to extension. No new complaints.  States that it is continuing to be concerning.  Patient sometimes is scared to stand up all the way secondary to the pain.      Past Medical History:  Diagnosis Date   Allergy    Arthritis    "scattered" (10/26/2018)   Asthma    C. difficile diarrhea    CAD (coronary artery disease) 2005   s/p PCI of RCA   Carotid artery stenosis    1-39% bilateral by dopplers 10/2016   Carotid stenosis    "? side; Dr. Scot Dock"   Cataract    NS OD   Cognitive impairment, mild, so stated    Colitis    Erectile dysfunction    GERD (gastroesophageal reflux disease)    Hyperlipidemia    Hypertension    Perforated stomach, acute 10/23/2018   Peripheral vascular disease ([place])    Retinal detachment    Vertebrobasilar artery syndrome    Vertigo    Past Surgical History:  Procedure Laterality Date   APPENDECTOMY  1954   CATARACT EXTRACTION Left 08/08/2019   Dr. Bing Plume   COLONOSCOPY     ESOPHAGOGASTRODUODENOSCOPY (EGD) WITH ESOPHAGEAL DILATION  09/2018   EYE SURGERY     LAPAROTOMY N/A 10/23/2018   Procedure: EXPLORATORY LAPAROTOMY WITH GRAHAM PATCH FOR PERFORATED ULCER AND PLACEMENT  OF GASTRIC TUBE;  Surgeon: Rolm Bookbinder, MD;  Location: [facility];  Service: General;  Laterality: N/A;   NASAL SINUS SURGERY  05/2018   RETINAL DETACHMENT SURGERY     "? 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  Retired.   2 caffeinated beverages daily, 3 glasses of wine daily   Never smoker no drug  use no tobacco   Social Determinants of Corporate investment banker Strain: Low Risk    Difficulty of Paying Living Expenses: Not hard at all  Food Insecurity: No Food Insecurity   Worried About Programme researcher, broadcasting/film/video in the Last Year: Never true   Barista in the Last Year: Never true  Transportation Needs: No Transportation Needs   Lack of Transportation (Medical): No   Lack of Transportation (Non-Medical): No  Physical Activity: Inactive   Days of Exercise per Week: 0 days   Minutes of Exercise per Session: 0 min  Stress: No Stress Concern Present   Feeling of Stress : Not at all  Social Connections: Moderately Integrated   Frequency of Communication with Friends and Family: More than three times a week   Frequency of Social Gatherings with Friends and Family: More than three times  a week   Attends Religious Services: Never   Database administrator or Organizations: Yes   Attends Engineer, structural: More than 4 times per year   Marital Status: Married   Allergies  Allergen Reactions   Neosporin [Bacitracin-Polymyxin B] Other (See Comments)    Redness   Family History  Problem Relation Age of Onset   Stroke Mother    Hyperlipidemia Mother    Hypertension Mother    Deep vein thrombosis Mother    Stroke Father    Hyperlipidemia Father    Hypertension Father    Deep vein thrombosis Father    Diverticulitis Father    Ulcerative colitis Father    Colon polyps Father    Colon cancer Neg Hx    Esophageal cancer Neg Hx    Stomach cancer Neg Hx    Rectal cancer Neg Hx      Current Outpatient Medications (Cardiovascular):    amLODipine (NORVASC) 5 MG tablet, Take 1 tablet (5 mg total) by mouth daily.   atorvastatin (LIPITOR) 80 MG tablet, Take 1 tablet (80 mg total) by mouth daily.   carvedilol (COREG) 6.25 MG tablet, Take 1 tablet (6.25 mg total) by mouth 2 (two) times daily with a meal.   hydrochlorothiazide (HYDRODIURIL) 25 MG tablet, Take 1 tablet (25 mg total) by mouth daily.  Current Outpatient Medications (Respiratory):    albuterol (PROVENTIL) (2.5 MG/3ML) 0.083% nebulizer solution, Take 3 mLs (2.5 mg total) by nebulization every 6 (six) hours as needed for wheezing or shortness of breath.   albuterol (VENTOLIN HFA) 108 (90 Base) MCG/ACT inhaler, INHALE 2 PUFFS INTO LUNGS EVERY 6 HOURS AS NEEDED FOR WHEEZING OR SHORTNESS OF BREATH.   azelastine (ASTELIN) 0.1 % nasal spray, Place 1 spray into both nostrils 2 (two) times daily.   fluticasone (FLONASE) 50 MCG/ACT nasal spray, 1 spray each nostril twice daily   fluticasone furoate-vilanterol (BREO ELLIPTA) 100-25 MCG/INH AEPB, Inhale 1 puff into the lungs daily.   fluticasone furoate-vilanterol (BREO ELLIPTA) 100-25 MCG/INH AEPB, INHALE 1 PUFF INTO THE LUNGS DAILY   montelukast (SINGULAIR) 10 MG  tablet, Take 1 tablet (10 mg total) by mouth at bedtime.  Current Outpatient Medications (Analgesics):    acetaminophen (TYLENOL) 500 MG tablet, Take 500 mg by mouth 2 (two) times daily.   aspirin EC 81 MG tablet, Take 81 mg by mouth daily.   Current Outpatient Medications (Other):    cholecalciferol (VITAMIN D3) 25 MCG (1000 UT) tablet, Take 1,000 Units by mouth daily.   donepezil (ARICEPT) 10 MG tablet, Take 1 tablet (10 mg total) by mouth daily.  Eyelid Cleansers (OCUSOFT EYELID CLEANSING) PADS, Place 1 application into both eyes 2 (two) times daily.   fish oil-omega-3 fatty acids 1000 MG capsule, Take 2 g by mouth 2 (two) times daily.    gabapentin (NEURONTIN) 100 MG capsule, Take 2 capsules (200 mg total) by mouth at bedtime.   gabapentin (NEURONTIN) 300 MG capsule, TAKE 1 CAPSULE(300 MG) BY MOUTH THREE TIMES DAILY   ofloxacin (OCUFLOX) 0.3 % ophthalmic solution, INSTILL 1 DROP IN LEFT EYE FOUR TIMES DAILY FOR 7 DAYS BEGINNING AFTER SURGERY   Polyvinyl Alcohol-Povidone (REFRESH OP), Place 1 drop into both eyes 2 (two) times daily.   Saw Palmetto 500 MG CAPS, Take 1,000 mg by mouth 2 (two) times daily.     Review of Systems:  No headache, visual changes, nausea, vomiting, diarrhea, constipation, dizziness, abdominal pain, skin rash, fevers, chills, night sweats, weight loss, swollen lymph nodes, body aches, joint swelling, chest pain, shortness of breath, mood changes. POSITIVE muscle aches  Objective  Blood pressure 132/64, pulse 64, height 5\' 5"  (1.651 m), weight 144 lb (65.3 kg), SpO2 98 %.   General: No apparent distress alert and oriented x3 mood and affect normal, dressed appropriately.  HEENT: Pupils equal, extraocular movements intact  Respiratory: Patient's speak in full sentences and does not appear short of breath  Cardiovascular: No lower extremity edema, non tender, no erythema  Gait antalgic MSK: Patient does have poor core strength noted.  Does have atrophy noted of  the lower extremities.  Significant weakness of abductor.  We will tightness of the right hamstring.  Tender to palpation more over the piriformis as well as the lateral aspect of the greater trochanteric area.   Procedure: Real-time Ultrasound Guided Injection of right greater trochanteric bursitis secondary to patient's body habitus Device: GE Logiq Q7 Ultrasound guided injection is preferred based studies that show increased duration, increased effect, greater accuracy, decreased procedural pain, increased response rate, and decreased cost with ultrasound guided versus blind injection.  Verbal informed consent obtained.  Time-out conducted.  Noted no overlying erythema, induration, or other signs of local infection.  Skin prepped in a sterile fashion.  Local anesthesia: Topical Ethyl chloride.  With sterile technique and under real time ultrasound guidance:  Greater trochanteric area was visualized and patient's bursa was noted. A 22-gauge 3 inch needle was inserted and 2 cc of 0.5% Marcaine and 1 cc of Kenalog 40 mg/dL was injected. Pictures taken Completed without difficulty  Pain immediately resolved suggesting accurate placement of the medication.  Advised to call if fevers/chills, erythema, induration, drainage, or persistent bleeding.  Impression: Technically successful ultrasound guided injection.    Impression and Recommendations:    The above documentation has been reviewed and is accurate and complete Lyndal Pulley, DO

## 2021-09-16 ENCOUNTER — Ambulatory Visit: Payer: Medicare Other | Admitting: Family Medicine

## 2021-09-16 ENCOUNTER — Encounter: Payer: Self-pay | Admitting: Family Medicine

## 2021-09-16 ENCOUNTER — Other Ambulatory Visit: Payer: Self-pay

## 2021-09-16 ENCOUNTER — Ambulatory Visit: Payer: Self-pay

## 2021-09-16 VITALS — BP 132/64 | HR 64 | Ht 65.0 in | Wt 144.0 lb

## 2021-09-16 DIAGNOSIS — M5431 Sciatica, right side: Secondary | ICD-10-CM

## 2021-09-16 DIAGNOSIS — M25551 Pain in right hip: Secondary | ICD-10-CM | POA: Diagnosis not present

## 2021-09-16 NOTE — Patient Instructions (Addendum)
Injection today Good to see you! Tried injection on side of hip, hope it will improve Take it a little easy this first week See you again in 2 months

## 2021-09-16 NOTE — Assessment & Plan Note (Signed)
Chronic problem with exacerbation.  We have been attempted multiple things with varying degrees of success.  Seems to be tighter more over the greater trochanteric area and the posterior aspect of the hip.  Patient on ultrasound did have an area that did seem to be a fairly large bursitis.  Injected this area and encourage patient to decrease activity over the course of the next week.  Patient is hoping to get some resolved and improvement.  Still would not want any type of surgical intervention follow-up with me again in 6 to 8 weeks.

## 2021-09-18 ENCOUNTER — Other Ambulatory Visit: Payer: Self-pay | Admitting: Family Medicine

## 2021-10-24 ENCOUNTER — Other Ambulatory Visit: Payer: Self-pay

## 2021-10-24 DIAGNOSIS — I10 Essential (primary) hypertension: Secondary | ICD-10-CM

## 2021-10-24 DIAGNOSIS — I2510 Atherosclerotic heart disease of native coronary artery without angina pectoris: Secondary | ICD-10-CM

## 2021-10-24 DIAGNOSIS — I6523 Occlusion and stenosis of bilateral carotid arteries: Secondary | ICD-10-CM

## 2021-10-24 DIAGNOSIS — E7800 Pure hypercholesterolemia, unspecified: Secondary | ICD-10-CM

## 2021-10-24 MED ORDER — AMLODIPINE BESYLATE 5 MG PO TABS: 5.0000 mg | ORAL_TABLET | Freq: Every day | ORAL | 0 refills | Status: DC

## 2021-10-31 ENCOUNTER — Other Ambulatory Visit: Payer: Self-pay | Admitting: Cardiology

## 2021-11-01 ENCOUNTER — Encounter: Payer: Self-pay | Admitting: Family Medicine

## 2021-11-01 ENCOUNTER — Ambulatory Visit: Payer: Medicare Other | Admitting: Family Medicine

## 2021-11-01 ENCOUNTER — Other Ambulatory Visit (INDEPENDENT_AMBULATORY_CARE_PROVIDER_SITE_OTHER): Payer: Medicare Other

## 2021-11-01 VITALS — BP 112/76 | HR 62 | Temp 97.9°F | Resp 16 | Wt 144.4 lb

## 2021-11-01 DIAGNOSIS — I10 Essential (primary) hypertension: Secondary | ICD-10-CM | POA: Diagnosis not present

## 2021-11-01 DIAGNOSIS — E7800 Pure hypercholesterolemia, unspecified: Secondary | ICD-10-CM

## 2021-11-01 DIAGNOSIS — G3184 Mild cognitive impairment, so stated: Secondary | ICD-10-CM | POA: Diagnosis not present

## 2021-11-01 LAB — CBC WITH DIFFERENTIAL/PLATELET
Basophils Absolute: 0.1 10*3/uL (ref 0.0–0.1)
Basophils Relative: 0.8 % (ref 0.0–3.0)
Eosinophils Absolute: 0.1 10*3/uL (ref 0.0–0.7)
Eosinophils Relative: 1.5 % (ref 0.0–5.0)
HCT: 40.7 % (ref 39.0–52.0)
Hemoglobin: 13.4 g/dL (ref 13.0–17.0)
Lymphocytes Relative: 22.1 % (ref 12.0–46.0)
Lymphs Abs: 2.1 10*3/uL (ref 0.7–4.0)
MCHC: 33 g/dL (ref 30.0–36.0)
MCV: 88.3 fl (ref 78.0–100.0)
Monocytes Absolute: 0.9 10*3/uL (ref 0.1–1.0)
Monocytes Relative: 10 % (ref 3.0–12.0)
Neutro Abs: 6.2 10*3/uL (ref 1.4–7.7)
Neutrophils Relative %: 65.6 % (ref 43.0–77.0)
Platelets: 171 10*3/uL (ref 150.0–400.0)
RBC: 4.61 Mil/uL (ref 4.22–5.81)
RDW: 13.7 % (ref 11.5–15.5)
WBC: 9.5 10*3/uL (ref 4.0–10.5)

## 2021-11-01 LAB — BASIC METABOLIC PANEL
BUN: 21 mg/dL (ref 6–23)
CO2: 29 mEq/L (ref 19–32)
Calcium: 9.3 mg/dL (ref 8.4–10.5)
Chloride: 105 mEq/L (ref 96–112)
Creatinine, Ser: 1.06 mg/dL (ref 0.40–1.50)
GFR: 64.5 mL/min (ref >60.00)
Glucose, Bld: 90 mg/dL (ref 70–99)
Potassium: 3.7 mEq/L (ref 3.5–5.1)
Sodium: 142 mEq/L (ref 135–145)

## 2021-11-01 LAB — HEPATIC FUNCTION PANEL
ALT: 25 U/L (ref 0–53)
AST: 23 U/L (ref 0–37)
Albumin: 4.2 g/dL (ref 3.5–5.2)
Alkaline Phosphatase: 78 U/L (ref 39–117)
Bilirubin, Direct: 0.1 mg/dL (ref 0.0–0.3)
Total Bilirubin: 0.6 mg/dL (ref 0.2–1.2)
Total Protein: 6.9 g/dL (ref 6.0–8.3)

## 2021-11-01 LAB — LIPID PANEL
Cholesterol: 130 mg/dL (ref 0–200)
HDL: 37.3 mg/dL — ABNORMAL LOW (ref >39.00)
LDL Cholesterol: 70 mg/dL (ref 0–99)
NonHDL: 93.01
Total CHOL/HDL Ratio: 3
Triglycerides: 114 mg/dL (ref 0.0–149.0)
VLDL: 22.8 mg/dL (ref 0.0–40.0)

## 2021-11-01 LAB — TSH: TSH: 2.9 u[IU]/mL (ref 0.35–5.50)

## 2021-11-01 NOTE — Assessment & Plan Note (Signed)
Chronic problem.  Well controlled today on Amlodipine, Coreg, and HCTZ.  Currently asymptomatic.  Check labs due to diuretic use.  No anticipated med changes.

## 2021-11-01 NOTE — Assessment & Plan Note (Signed)
Pt is doing well on Aricept and does not feel that memory is worsening.  No family here to confirm this but pt remembers that my son had surgery this summer and inquired about him- which was impressive.  No changes at this time

## 2021-11-01 NOTE — Assessment & Plan Note (Signed)
Chronic problem.  Tolerating Lipitor w/o difficulty.  Check labs.  Adjust meds prn  

## 2021-11-01 NOTE — Progress Notes (Signed)
° °  Subjective:    Patient ID: [name], male    DOB: [birth year], 87 y.o.   MRN: [id number]  HPI HTN- chronic problem, on Amlodipine 5mg  daily, Coreg 6.25mg  BID, HCTZ 25mg  daily w/ good control.  'i'm feeling great'.  No CP, SOB, HAs, visual changes, edema.  Hyperlipidemia- chronic problem, on Lipitor 80mg  daily.  Pt w/ limited physical activity but reports climbing stairs frequently at home.  No abd pain, N/V.  Cognitive impairment- ongoing issue for pt.  On Aricept 10mg  daily.  Pt feels memory is currently stable.  Denies worsening sxs.   Review of Systems For ROS see HPI   This visit occurred during the SARS-CoV-2 public health emergency.  Safety protocols were in place, including screening questions prior to the visit, additional usage of staff PPE, and extensive cleaning of exam room while observing appropriate contact time as indicated for disinfecting solutions.      Objective:   Physical Exam Vitals reviewed.  Constitutional:      General: He is not in acute distress.    Appearance: Normal appearance. He is well-developed.  HENT:     Head: Normocephalic and atraumatic.  Eyes:     Extraocular Movements: Extraocular movements intact.     Conjunctiva/sclera: Conjunctivae normal.     Pupils: Pupils are equal, round, and reactive to light.  Neck:     Thyroid: No thyromegaly.  Cardiovascular:     Rate and Rhythm: Normal rate and regular rhythm.     Pulses: Normal pulses.     Heart sounds: Normal heart sounds. No murmur heard. Pulmonary:     Effort: Pulmonary effort is normal. No respiratory distress.     Breath sounds: Normal breath sounds.  Abdominal:     General: Bowel sounds are normal. There is no distension.     Palpations: Abdomen is soft.  Musculoskeletal:     Cervical back: Normal range of motion and neck supple.     Right lower leg: No edema.     Left lower leg: No edema.  Lymphadenopathy:     Cervical: No cervical adenopathy.  Skin:    General: Skin is  warm and dry.  Neurological:     General: No focal deficit present.     Mental Status: He is alert and oriented to person, place, and time.     Cranial Nerves: No cranial nerve deficit.  Psychiatric:        Mood and Affect: Mood normal.        Behavior: Behavior normal.          Assessment & Plan:

## 2021-11-01 NOTE — Patient Instructions (Signed)
Go to [address] to get your labs drawn Schedule your complete physical in 6 months We'll notify you of your lab results and make any changes if needed Continue to work on healthy diet and regular exercise- you're doing great! Call with any questions or concerns Stay Safe!  Stay Healthy! Happy New Year!!

## 2021-11-08 NOTE — Progress Notes (Signed)
[name] [address] [place] Phone: [phone] Subjective:   [name], am serving as a scribe for Dr. Hulan Saas. This visit occurred during the SARS-CoV-2 public health emergency.  Safety protocols were in place, including screening questions prior to the visit, additional usage of staff PPE, and extensive cleaning of exam room while observing appropriate contact time as indicated for disinfecting solutions.   I'm seeing this patient by the request  of:  Midge Minium, MD  CC: right hip pain   [id number]  09/16/2021 Chronic problem with exacerbation.  We have been attempted multiple things with varying degrees of success.  Seems to be tighter more over the greater trochanteric area and the posterior aspect of the hip.  Patient on ultrasound did have an area that did seem to be a fairly large bursitis.  Injected this area and encourage patient to decrease activity over the course of the next week.  Patient is hoping to get some resolved and improvement.  Still would not want any type of surgical intervention follow-up with me again in 6 to 8 weeks.  Update 11/11/2021 [name] is a 87 y.o. male coming in with complaint of lumbar spine and R hip pain. Facet injections in July 2022. Patient states hip and lumbar pain is the same. No new complaints.       Past Medical History:  Diagnosis Date   Allergy    Arthritis    "scattered" (10/26/2018)   Asthma    C. difficile diarrhea    CAD (coronary artery disease) 2005   s/p PCI of RCA   Carotid artery stenosis    1-39% bilateral by dopplers 10/2016   Carotid stenosis    "? side; Dr. Scot Dock"   Cataract    NS OD   Cognitive impairment, mild, so stated    Colitis    Erectile dysfunction    GERD (gastroesophageal reflux disease)    Hyperlipidemia    Hypertension    Perforated stomach, acute 10/23/2018   Peripheral vascular disease ([place])    Retinal detachment     Vertebrobasilar artery syndrome    Vertigo    Past Surgical History:  Procedure Laterality Date   APPENDECTOMY  1954   CATARACT EXTRACTION Left 08/08/2019   Dr. Bing Plume   COLONOSCOPY     ESOPHAGOGASTRODUODENOSCOPY (EGD) WITH ESOPHAGEAL DILATION  09/2018   EYE SURGERY     LAPAROTOMY N/A 10/23/2018   Procedure: EXPLORATORY LAPAROTOMY WITH GRAHAM PATCH FOR PERFORATED ULCER AND PLACEMENT OF GASTRIC TUBE;  Surgeon: Rolm Bookbinder, MD;  Location: [facility];  Service: General;  Laterality: N/A;   NASAL SINUS SURGERY  05/2018   RETINAL DETACHMENT SURGERY     "? 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  Retired.   2 caffeinated beverages daily, 3 glasses of wine daily   Never smoker no drug use no tobacco   Social Determinants of Radio broadcast assistant Strain: Low Risk    Difficulty of Paying Living Expenses: Not hard at all  Food Insecurity: No Food Insecurity   Worried About Charity fundraiser in the Last Year: Never true   Ran Out of Food in the Last Year: Never true  Transportation Needs: No Transportation Needs   Lack of Transportation (Medical): No   Lack of Transportation  (Non-Medical): No  Physical Activity: Inactive   Days of Exercise per Week: 0 days   Minutes of Exercise per Session: 0 min  Stress: No Stress Concern Present   Feeling of Stress : Not at all  Social Connections: Moderately Integrated   Frequency of Communication with Friends and Family: More than three times a week   Frequency of Social Gatherings with Friends and Family: More than three times a week   Attends Religious Services: Never   Marine scientist or Organizations: Yes   Attends Music therapist: More than 4 times per year   Marital Status: Married   Allergies  Allergen Reactions   Neosporin [Bacitracin-Polymyxin B] Other (See Comments)    Redness   Family History  Problem Relation Age of Onset   Stroke Mother    Hyperlipidemia Mother    Hypertension Mother    Deep vein thrombosis Mother    Stroke Father    Hyperlipidemia Father    Hypertension Father    Deep vein thrombosis Father    Diverticulitis Father    Ulcerative colitis Father    Colon polyps Father    Colon cancer Neg Hx    Esophageal cancer Neg Hx    Stomach cancer Neg Hx    Rectal cancer Neg Hx      Current Outpatient Medications (Cardiovascular):    amLODipine (NORVASC) 5 MG tablet, Take 1 tablet (5 mg total) by mouth daily. Please make yearly appt with Dr. Radford Pax for January 2023 before anymore refills. Thank you 1st attempt   atorvastatin (LIPITOR) 80 MG tablet, Take 1 tablet (80 mg total) by mouth daily. Please make overdue appt with Dr. Radford Pax before anymore refills. Thank you 1st attempt   carvedilol (COREG) 6.25 MG tablet, Take 1 tablet (6.25 mg total) by mouth 2 (two) times daily with a meal.   hydrochlorothiazide (HYDRODIURIL) 25 MG tablet, Take 1 tablet (25 mg total) by mouth daily.  Current Outpatient Medications (Respiratory):    albuterol (PROVENTIL) (2.5 MG/3ML) 0.083% nebulizer solution, Take 3 mLs (2.5 mg total) by nebulization every 6 (six) hours as needed for  wheezing or shortness of breath.   albuterol (VENTOLIN HFA) 108 (90 Base) MCG/ACT inhaler, INHALE 2 PUFFS INTO LUNGS EVERY 6 HOURS AS NEEDED FOR WHEEZING OR SHORTNESS OF BREATH.   azelastine (ASTELIN) 0.1 % nasal spray, Place 1 spray into both nostrils 2 (two) times daily.   fluticasone (FLONASE) 50 MCG/ACT nasal spray, 1 spray each nostril twice daily   fluticasone furoate-vilanterol (BREO ELLIPTA) 100-25 MCG/INH AEPB, Inhale 1 puff into the lungs daily.   fluticasone furoate-vilanterol (BREO ELLIPTA)  100-25 MCG/INH AEPB, INHALE 1 PUFF INTO THE LUNGS DAILY   montelukast (SINGULAIR) 10 MG tablet, Take 1 tablet (10 mg total) by mouth at bedtime.  Current Outpatient Medications (Analgesics):    acetaminophen (TYLENOL) 500 MG tablet, Take 500 mg by mouth 2 (two) times daily.   aspirin EC 81 MG tablet, Take 81 mg by mouth daily.   Current Outpatient Medications (Other):    cholecalciferol (VITAMIN D3) 25 MCG (1000 UT) tablet, Take 1,000 Units by mouth daily.   donepezil (ARICEPT) 10 MG tablet, Take 1 tablet (10 mg total) by mouth daily.   Eyelid Cleansers (OCUSOFT EYELID CLEANSING) PADS, Place 1 application into both eyes 2 (two) times daily.   fish oil-omega-3 fatty acids 1000 MG capsule, Take 2 g by mouth 2 (two) times daily.    gabapentin (NEURONTIN) 100 MG capsule, Take 2 capsules (200 mg total) by mouth at bedtime.   gabapentin (NEURONTIN) 300 MG capsule, TAKE 1 CAPSULE(300 MG) BY MOUTH THREE TIMES DAILY   ofloxacin (OCUFLOX) 0.3 % ophthalmic solution, INSTILL 1 DROP IN LEFT EYE FOUR TIMES DAILY FOR 7 DAYS BEGINNING AFTER SURGERY   Polyvinyl Alcohol-Povidone (REFRESH OP), Place 1 drop into both eyes 2 (two) times daily.   Saw Palmetto 500 MG CAPS, Take 1,000 mg by mouth 2 (two) times daily.     Objective  Blood pressure 126/60, pulse 62, height 5\' 5"  (1.651 m), weight 145 lb (65.8 kg), SpO2 94 %.   General: No apparent distress alert and oriented x3 mood and affect normal, dressed  appropriately.  HEENT: Pupils equal, extraocular movements intact  Respiratory: Patient's speak in full sentences and does not appear short of breath  Cardiovascular: No lower extremity edema, non tender, no erythema  Gait normal with good balance and coordination.  MSK:  low back exam does have loss lordosis  Patient does have tightness noted on the [facility] on the right side.  Tenderness over the hamstring insertion.  Patient also has some tenderness over the piriformis.  Neurovascular intact.  Does have atrophy of the musculature bilaterally.    Impression and Recommendations:     The above documentation has been reviewed and is accurate and complete Lyndal Pulley, DO

## 2021-11-11 ENCOUNTER — Encounter: Payer: Self-pay | Admitting: Family Medicine

## 2021-11-11 ENCOUNTER — Ambulatory Visit: Payer: Medicare Other | Admitting: Family Medicine

## 2021-11-11 ENCOUNTER — Ambulatory Visit: Payer: Self-pay

## 2021-11-11 ENCOUNTER — Other Ambulatory Visit: Payer: Self-pay

## 2021-11-11 VITALS — BP 126/60 | HR 62 | Ht 65.0 in | Wt 145.0 lb

## 2021-11-11 DIAGNOSIS — G5701 Lesion of sciatic nerve, right lower limb: Secondary | ICD-10-CM

## 2021-11-11 NOTE — Assessment & Plan Note (Signed)
7 difficulties overall.  Very difficult to assess what is giving patient the most pain.  Patient still states that it is only in the changing of the position he gets in the problem.  When he goes from a laying down to sitting and sitting to standing.  Patient also notices it when he is flexed to get something off the floor and then is coming up.  0.1 or 2 seconds.  Patient states that the pain is severe.  Continuing to have the discomfort though.  Discussed with patient about soundwave therapy with the ossification noted previously in the gluteal area in the hamstring.  Patient would like to try this and we will refer to see how patient would respond.  Discussed with patient icing regimen and home exercises otherwise.  Follow-up with me again in 6 weeks.  Total time with patient today reviewing previous imaging and discussing different treatment options 32 minutes.

## 2021-11-11 NOTE — Patient Instructions (Addendum)
Referral for sound wave therapy put in [facility] but worth a try See you again in 6 weeks

## 2021-11-15 ENCOUNTER — Ambulatory Visit (INDEPENDENT_AMBULATORY_CARE_PROVIDER_SITE_OTHER): Payer: Self-pay | Admitting: Family Medicine

## 2021-11-15 ENCOUNTER — Ambulatory Visit: Payer: Self-pay | Admitting: Family Medicine

## 2021-11-15 DIAGNOSIS — G5701 Lesion of sciatic nerve, right lower limb: Secondary | ICD-10-CM

## 2021-11-15 NOTE — Progress Notes (Signed)
° °  [name] is a 87 y.o. male who presents to [facility] today for the following:  Shockwave therapy for piriformis and proximal hamstring insertion at ischial tuberosity at the request of Dr. Hulan Saas.  Patient has had prior facet injections performed by Dr. Tamala Julian without improvement in his back or pelvic pain.  He presents today for trial of shockwave therapy in this area to see if this will help with symptoms.  Procedure: ECSWT Indications:  right piriformis syndrome   Procedure Details Consent: Risks of procedure as well as the alternatives and risks of each were explained to the patient.  Written consent for procedure obtained. Time Out: Verified patient identification, verified procedure, site was marked, verified correct patient position, medications/allergies/relevent history reviewed.  The area was cleaned with alcohol swab.     The right piriformis was targeted for Extracorporeal shockwave therapy.    Preset:  chronic trochanteric bursitis Power Level: 120 Frequency: 10 Impulse/cycles: 2000 Head size: large   Patient tolerated procedure well without immediate complications Plans to repeat weekly for at least 4 treatments.   [place] Constable, D.O.  PGY-4 [facility]  11/15/2021 11:14 AM  Addendum:  I was the preceptor for this visit and available for immediate consultation.  Karlton Lemon MD Kirt Boys

## 2021-11-18 ENCOUNTER — Telehealth: Payer: Self-pay

## 2021-11-18 NOTE — Telephone Encounter (Signed)
°  Patient Consent for Virtual Visit        [name] has provided verbal consent on 11/18/2021 for a virtual visit (video or telephone).   CONSENT FOR VIRTUAL VISIT FOR:  [name]  By participating in this virtual visit I agree to the following:  I hereby voluntarily request, consent and authorize [facility] and its employed or contracted physicians, physician assistants, nurse practitioners or other licensed health care professionals (the Practitioner), to provide me with telemedicine health care services (the Services") as deemed necessary by the treating Practitioner. I acknowledge and consent to receive the Services by the Practitioner via telemedicine. I understand that the telemedicine visit will involve communicating with the Practitioner through live audiovisual communication technology and the disclosure of certain medical information by electronic transmission. I acknowledge that I have been given the opportunity to request an in-person assessment or other available alternative prior to the telemedicine visit and am voluntarily participating in the telemedicine visit.  I understand that I have the right to withhold or withdraw my consent to the use of telemedicine in the course of my care at any time, without affecting my right to future care or treatment, and that the Practitioner or I may terminate the telemedicine visit at any time. I understand that I have the right to inspect all information obtained and/or recorded in the course of the telemedicine visit and may receive copies of available information for a reasonable fee.  I understand that some of the potential risks of receiving the Services via telemedicine include:  Delay or interruption in medical evaluation due to technological equipment failure or disruption; Information transmitted may not be sufficient (e.g. poor resolution of images) to allow for appropriate medical decision making by the Practitioner; and/or  In  rare instances, security protocols could fail, causing a breach of personal health information.  Furthermore, I acknowledge that it is my responsibility to provide information about my medical history, conditions and care that is complete and accurate to the best of my ability. I acknowledge that Practitioner's advice, recommendations, and/or decision may be based on factors not within their control, such as incomplete or inaccurate data provided by me or distortions of diagnostic images or specimens that may result from electronic transmissions. I understand that the practice of medicine is not an exact science and that Practitioner makes no warranties or guarantees regarding treatment outcomes. I acknowledge that a copy of this consent can be made available to me via my patient portal [facility] MyChart), or I can request a printed copy by calling the office of [facility].    I understand that my insurance will be billed for this visit.   I have read or had this consent read to me. I understand the contents of this consent, which adequately explains the benefits and risks of the Services being provided via telemedicine.  I have been provided ample opportunity to ask questions regarding this consent and the Services and have had my questions answered to my satisfaction. I give my informed consent for the services to be provided through the use of telemedicine in my medical care

## 2021-11-18 NOTE — Progress Notes (Deleted)
Virtual Visit via Video Note   This visit type was conducted due to national recommendations for restrictions regarding the COVID-19 Pandemic (e.g. social distancing) in an effort to limit this patient's exposure and mitigate transmission in our community.  Due to his co-morbid illnesses, this patient is at least at moderate risk for complications without adequate follow up.  This format is felt to be most appropriate for this patient at this time.  All issues noted in this document were discussed and addressed.  A limited physical exam was performed with this format.  Please refer to the patient's chart for his consent to telehealth for [facility].       Date:  11/18/2021   ID:  [name], DOB 06/13/1935, MRN [id number] The patient was identified using 2 identifiers.  Patient Location: Home Provider Location: Home Office   PCP:  Midge Minium, MD   [facility] Providers Cardiologist:  Fransico Him, MD     Evaluation Performed:  Follow-Up Visit  Chief Complaint:  CAD. HTN, HLD  History of Present Illness:    [name] is a 87 y.o. male with  a hx of  ASCAD s/p PCI of the RCA, HTN, Carotid artery stenosis and dyslipidemia.  He is here today for followup and is doing well.  He denies any chest pain or pressure, SOB, DOE, PND, orthopnea, LE edema, dizziness, palpitations or syncope.  He is compliant with his meds and is tolerating meds with no SE.     The patient does not have symptoms concerning for COVID-19 infection (fever, chills, cough, or new shortness of breath).    Past Medical History:  Diagnosis Date   Allergy    Arthritis    "scattered" (10/26/2018)   Asthma    C. difficile diarrhea    CAD (coronary artery disease) 2005   s/p PCI of RCA   Carotid artery stenosis    1-39% bilateral by dopplers 10/2016   Carotid stenosis    "? side; Dr. Scot Dock"   Cataract    NS OD   Cognitive impairment, mild, so stated    Colitis    Erectile dysfunction     GERD (gastroesophageal reflux disease)    Hyperlipidemia    Hypertension    Perforated stomach, acute 10/23/2018   Peripheral vascular disease ([place])    Retinal detachment    Vertebrobasilar artery syndrome    Vertigo    Past Surgical History:  Procedure Laterality Date   APPENDECTOMY  1954   CATARACT EXTRACTION Left 08/08/2019   Dr. Bing Plume   COLONOSCOPY     ESOPHAGOGASTRODUODENOSCOPY (EGD) WITH ESOPHAGEAL DILATION  09/2018   EYE SURGERY     LAPAROTOMY N/A 10/23/2018   Procedure: EXPLORATORY LAPAROTOMY WITH GRAHAM PATCH FOR PERFORATED ULCER AND PLACEMENT OF GASTRIC TUBE;  Surgeon: Rolm Bookbinder, MD;  Location: [facility];  Service: General;  Laterality: N/A;   NASAL SINUS SURGERY  05/2018   RETINAL DETACHMENT SURGERY     "? side"   UPPER GASTROINTESTINAL ENDOSCOPY       No outpatient medications have been marked as taking for the 11/19/21 encounter (Appointment) with Sueanne Margarita, MD.     Allergies:   Neosporin [bacitracin-polymyxin b]   Social History   Tobacco Use   Smoking status: Former    Types: Cigars    Quit date: 05/13/1961    Years since quitting: 60.5   Smokeless tobacco: Never   Tobacco comments:    "never inhaled" in the early 60's  Vaping Use   Vaping Use: Never used  Substance Use Topics   Alcohol use: Yes    Alcohol/week: 14.0 standard drinks    Types: 14 Glasses of wine per week   Drug use: Never     Family Hx: The patient's family history includes Colon polyps in his father; Deep vein thrombosis in his father and mother; Diverticulitis in his father; Hyperlipidemia in his father and mother; Hypertension in his father and mother; Stroke in his father and mother; Ulcerative colitis in his father. There is no history of Colon cancer, Esophageal cancer, Stomach cancer, or Rectal cancer.  ROS:   Please see the history of present illness.     All other systems reviewed and are negative.   Prior CV studies:   The following studies were reviewed  today:  None  Labs/Other Tests and Data Reviewed:    EKG:  No ECG reviewed.  Recent Labs: 11/01/2021: ALT 25; BUN 21; Creatinine, Ser 1.06; Hemoglobin 13.4; Platelets 171.0; Potassium 3.7; Sodium 142; TSH 2.90   Recent Lipid Panel Lab Results  Component Value Date/Time   CHOL 130 11/01/2021 11:49 AM   CHOL 89 (L) 09/27/2019 08:10 AM   TRIG 114.0 11/01/2021 11:49 AM   HDL 37.30 (L) 11/01/2021 11:49 AM   HDL 34 (L) 09/27/2019 08:10 AM   CHOLHDL 3 11/01/2021 11:49 AM   LDLCALC 70 11/01/2021 11:49 AM   LDLCALC 59 11/02/2020 02:28 PM    Wt Readings from Last 3 Encounters:  11/11/21 145 lb (65.8 kg)  11/01/21 144 lb 6.4 oz (65.5 kg)  09/16/21 144 lb (65.3 kg)     Risk Assessment/Calculations:          Objective:    Vital Signs:  There were no vitals taken for this visit.   VITAL SIGNS:  reviewed GEN:  no acute distress EYES:  sclerae anicteric, EOMI - Extraocular Movements Intact RESPIRATORY:  normal respiratory effort, symmetric expansion CARDIOVASCULAR:  no peripheral edema SKIN:  no rash, lesions or ulcers. MUSCULOSKELETAL:  no obvious deformities. NEURO:  alert and oriented x 3, no obvious focal deficit PSYCH:  normal affect  ASSESSMENT & PLAN:    1.  ASCAD -s/p PCI of the RCA -He has not had any anginal symptoms since I saw her last. -Continue prescription drug management with aspirin 81 mg daily, atorvastatin 80 mg daily and carvedilol 6.25 mg twice daily with as needed refills   2.  HTN -His BP has been adequately controlled at home. -Continue prescription drug management carvedilol 6.25 mg twice daily, amlodipine 5 mg daily, HCTZ 25 mg daily with as needed refills -I have personally reviewed and interpreted outside labs performed by patient's PCP which showed serum creatinine 1.06, potassium 3.7 on 11/01/2021   3.  Bilateral carotid artery stenosis -dopplers 10/2018 and 01/2021 showed 1-39% right and 40-59% left carotid stenosis -I will repeat Dopplers  01/2022 -Continue medical therapy with aspirin and statin   4.  HLD -LDL goal < 70. -I have personally reviewed and interpreted outside labs performed by patient's PCP which showed LDL 70, HDL 37, triglycerides 114 on 11/01/2021 and ALT 25 -Continue prescription drug management Lipitor 80 mg daily with as needed refills        COVID-19 Education: The signs and symptoms of COVID-19 were discussed with the patient and how to seek care for testing (follow up with PCP or arrange E-visit).  The importance of social distancing was discussed today.  Time:   Today, I have spent  20 minutes with the patient with telehealth technology discussing the above problems.     Medication Adjustments/Labs and Tests Ordered: Current medicines are reviewed at length with the patient today.  Concerns regarding medicines are outlined above.   Tests Ordered: No orders of the defined types were placed in this encounter.   Medication Changes: No orders of the defined types were placed in this encounter.   Follow Up:  In Person in 1 year(s)  Signed, Fransico Him, MD  11/18/2021 4:57 PM    [place]

## 2021-11-19 ENCOUNTER — Telehealth: Payer: Medicare Other | Admitting: Cardiology

## 2021-11-19 ENCOUNTER — Other Ambulatory Visit: Payer: Self-pay

## 2021-11-19 DIAGNOSIS — I2510 Atherosclerotic heart disease of native coronary artery without angina pectoris: Secondary | ICD-10-CM

## 2021-11-19 DIAGNOSIS — I6523 Occlusion and stenosis of bilateral carotid arteries: Secondary | ICD-10-CM

## 2021-11-19 DIAGNOSIS — E7800 Pure hypercholesterolemia, unspecified: Secondary | ICD-10-CM

## 2021-11-19 DIAGNOSIS — I10 Essential (primary) hypertension: Secondary | ICD-10-CM

## 2021-11-20 ENCOUNTER — Ambulatory Visit (INDEPENDENT_AMBULATORY_CARE_PROVIDER_SITE_OTHER): Payer: Self-pay | Admitting: Family Medicine

## 2021-11-20 VITALS — Ht 65.0 in

## 2021-11-20 DIAGNOSIS — G5701 Lesion of sciatic nerve, right lower limb: Secondary | ICD-10-CM

## 2021-11-20 NOTE — Progress Notes (Signed)
° °  [name] is a 87 y.o. male who presents to [facility] today for the following:  Piriformis syndrome Patient presents for his second shockwave therapy for right piriformis syndrome He noticed some transient improvement after his last visit, but then reports his pain is been similar to before his shockwave therapy  Procedure: ECSWT Indications:  Right Piriformis Syndrome   Procedure Details Consent: Risks of procedure as well as the alternatives and risks of each were explained to the patient.  Written consent for procedure obtained. Time Out: Verified patient identification, verified procedure, site was marked, verified correct patient position, medications/allergies/relevent history reviewed.  The area was cleaned with alcohol swab.     The right piriformis was targeted for Extracorporeal shockwave therapy.    Preset: Chronic trochanteric bursitis Power Level: 120 Frequency: 10 Impulse/cycles: 2000 Head size: Large   Patient tolerated procedure well without immediate complications He plans to complete at least 4 treatments, he will follow-up in 1 week.   Luis Abed, D.O.  PGY-4 [facility]  11/20/2021 2:54 PM  Addendum:  I was the preceptor for this visit and available for immediate consultation.  Norton Blizzard MD Marrianne Mood

## 2021-11-27 ENCOUNTER — Other Ambulatory Visit: Payer: Self-pay | Admitting: Primary Care

## 2021-11-27 ENCOUNTER — Ambulatory Visit (INDEPENDENT_AMBULATORY_CARE_PROVIDER_SITE_OTHER): Payer: Self-pay | Admitting: Family Medicine

## 2021-11-27 VITALS — Ht 65.0 in

## 2021-11-27 DIAGNOSIS — G5701 Lesion of sciatic nerve, right lower limb: Secondary | ICD-10-CM

## 2021-11-27 NOTE — Progress Notes (Signed)
° °  Nobuo Nunziata is a 87 y.o. male who presents to [facility] today for the following:  Patient presents today for his third shockwave therapy for right piriformis syndrome He has not noticed much improvement with the treatment so far   Procedure: ECSWT Indications: Right piriformis syndrome   Procedure Details Consent: Risks of procedure as well as the alternatives and risks of each were explained to the patient.  Written consent for procedure obtained. Time Out: Verified patient identification, verified procedure, site was marked, verified correct patient position, medications/allergies/relevent history reviewed.  The area was cleaned with alcohol swab.     The right piriformis was targeted for Extracorporeal shockwave therapy.    Preset: Chronic trochanteric bursitis Power Level: 120 Frequency: 12 Impulse/cycles: 2000 Head size: Large   Patient tolerated procedure well without immediate complications Discussed with patient that if he does not have much improvement after this session, would not recommend repeating.  Advised that he can decide if he would like to follow-up next week for a fourth treatment if he notices any improvement after this treatment.    Luis Abed, D.O.  PGY-4 [facility]  11/27/2021 4:22 PM  Addendum:  I was the preceptor for this visit and available for immediate consultation.  Norton Blizzard MD Marrianne Mood

## 2021-11-28 ENCOUNTER — Encounter: Payer: Self-pay | Admitting: Cardiology

## 2021-11-28 ENCOUNTER — Other Ambulatory Visit: Payer: Self-pay

## 2021-11-28 ENCOUNTER — Ambulatory Visit: Payer: Medicare Other | Admitting: Cardiology

## 2021-11-28 VITALS — BP 156/80 | HR 66 | Ht 65.0 in | Wt 149.4 lb

## 2021-11-28 DIAGNOSIS — I10 Essential (primary) hypertension: Secondary | ICD-10-CM

## 2021-11-28 DIAGNOSIS — E7800 Pure hypercholesterolemia, unspecified: Secondary | ICD-10-CM

## 2021-11-28 DIAGNOSIS — I2510 Atherosclerotic heart disease of native coronary artery without angina pectoris: Secondary | ICD-10-CM

## 2021-11-28 DIAGNOSIS — I6523 Occlusion and stenosis of bilateral carotid arteries: Secondary | ICD-10-CM | POA: Diagnosis not present

## 2021-11-28 MED ORDER — AMLODIPINE BESYLATE 5 MG PO TABS: 5.0000 mg | ORAL_TABLET | Freq: Every day | ORAL | 0 refills | Status: DC

## 2021-11-28 MED ORDER — CARVEDILOL 6.25 MG PO TABS: 6.2500 mg | ORAL_TABLET | Freq: Two times a day (BID) | ORAL | 3 refills | Status: DC

## 2021-11-28 MED ORDER — ATORVASTATIN CALCIUM 80 MG PO TABS: 80.0000 mg | ORAL_TABLET | Freq: Every day | ORAL | 0 refills | Status: DC

## 2021-11-28 MED ORDER — OMEGA-3 FATTY ACIDS 1000 MG PO CAPS: 2.0000 g | ORAL_CAPSULE | Freq: Two times a day (BID) | ORAL | 3 refills | Status: AC

## 2021-11-28 MED ORDER — HYDROCHLOROTHIAZIDE 25 MG PO TABS: 25.0000 mg | ORAL_TABLET | Freq: Every day | ORAL | 3 refills | Status: DC

## 2021-11-28 NOTE — Progress Notes (Addendum)
Cardiology Office Note:    Date:  11/28/2021   ID:  [name], DOB 10-10-1935, MRN [id number]  PCP:  Midge Minium, MD  Cardiologist:  Fransico Him, MD    Referring MD: Midge Minium, MD   Chief Complaint  Patient presents with   Coronary Artery Disease   Hypertension   Hyperlipidemia    History of Present Illness:    [name] is a 87 y.o. male with a hx of  ASCAD s/p PCI of the RCA, HTN, Carotid artery stenosis and dyslipidemia. He is here today for followup and is doing well.  He denies any chest pain or pressure, SOB, DOE, PND, orthopnea, LE edema, dizziness, palpitations or syncope. He is compliant with his meds and is tolerating meds with no SE.    Past Medical History:  Diagnosis Date   Allergy    Arthritis    "scattered" (10/26/2018)   Asthma    C. difficile diarrhea    CAD (coronary artery disease) 2005   s/p PCI of RCA   Carotid artery stenosis    1-39% bilateral by dopplers 10/2016   Carotid stenosis    "? side; Dr. Scot Dock"   Cataract    NS OD   Cognitive impairment, mild, so stated    Colitis    Erectile dysfunction    GERD (gastroesophageal reflux disease)    Hyperlipidemia    Hypertension    Perforated stomach, acute 10/23/2018   Peripheral vascular disease ([place])    Retinal detachment    Vertebrobasilar artery syndrome    Vertigo     Past Surgical History:  Procedure Laterality Date   APPENDECTOMY  1954   CATARACT EXTRACTION Left 08/08/2019   Dr. Bing Plume   COLONOSCOPY     ESOPHAGOGASTRODUODENOSCOPY (EGD) WITH ESOPHAGEAL DILATION  09/2018   EYE SURGERY     LAPAROTOMY N/A 10/23/2018   Procedure: EXPLORATORY LAPAROTOMY WITH GRAHAM PATCH FOR PERFORATED ULCER AND PLACEMENT OF GASTRIC TUBE;  Surgeon: Rolm Bookbinder, MD;  Location: [facility];  Service: General;  Laterality: N/A;   NASAL SINUS SURGERY  05/2018   RETINAL DETACHMENT SURGERY     "? side"   UPPER GASTROINTESTINAL ENDOSCOPY      Current Medications: Current Meds   Medication Sig   acetaminophen (TYLENOL) 500 MG tablet Take 500 mg by mouth 2 (two) times daily.   albuterol (PROVENTIL) (2.5 MG/3ML) 0.083% nebulizer solution Take 3 mLs (2.5 mg total) by nebulization every 6 (six) hours as needed for wheezing or shortness of breath.   albuterol (VENTOLIN HFA) 108 (90 Base) MCG/ACT inhaler INHALE 2 PUFFS INTO LUNGS EVERY 6 HOURS AS NEEDED FOR WHEEZING OR SHORTNESS OF BREATH.   amLODipine (NORVASC) 5 MG tablet Take 1 tablet (5 mg total) by mouth daily. Please make yearly appt with Dr. Radford Pax for January 2023 before anymore refills. Thank you 1st attempt   aspirin EC 81 MG tablet Take 81 mg by mouth daily.   atorvastatin (LIPITOR) 80 MG tablet Take 1 tablet (80 mg total) by mouth daily. Please make overdue appt with Dr. Radford Pax before anymore refills. Thank you 1st attempt   azelastine (ASTELIN) 0.1 % nasal spray Place 1 spray into both nostrils 2 (two) times daily.   carvedilol (COREG) 6.25 MG tablet Take 1 tablet (6.25 mg total) by mouth 2 (two) times daily with a meal.   cholecalciferol (VITAMIN D3) 25 MCG (1000 UT) tablet Take 1,000 Units by mouth daily.   donepezil (ARICEPT) 10 MG tablet Take 1  tablet (10 mg total) by mouth daily.   Eyelid Cleansers (OCUSOFT EYELID CLEANSING) PADS Place 1 application into both eyes 2 (two) times daily.   fish oil-omega-3 fatty acids 1000 MG capsule Take 2 g by mouth 2 (two) times daily.    fluticasone (FLONASE) 50 MCG/ACT nasal spray 1 spray each nostril twice daily   fluticasone furoate-vilanterol (BREO ELLIPTA) 100-25 MCG/INH AEPB Inhale 1 puff into the lungs daily.   fluticasone furoate-vilanterol (BREO ELLIPTA) 100-25 MCG/INH AEPB INHALE 1 PUFF INTO THE LUNGS DAILY   gabapentin (NEURONTIN) 100 MG capsule Take 2 capsules (200 mg total) by mouth at bedtime.   gabapentin (NEURONTIN) 300 MG capsule TAKE 1 CAPSULE(300 MG) BY MOUTH THREE TIMES DAILY   hydrochlorothiazide (HYDRODIURIL) 25 MG tablet Take 1 tablet (25 mg total) by  mouth daily.   montelukast (SINGULAIR) 10 MG tablet TAKE 1 TABLET BY MOUTH AT BEDTIME. GENERIC EQUIVALENT FOR SINGULAIR.   ofloxacin (OCUFLOX) 0.3 % ophthalmic solution INSTILL 1 DROP IN LEFT EYE FOUR TIMES DAILY FOR 7 DAYS BEGINNING AFTER SURGERY   Polyvinyl Alcohol-Povidone (REFRESH OP) Place 1 drop into both eyes 2 (two) times daily.   Saw Palmetto 500 MG CAPS Take 1,000 mg by mouth 2 (two) times daily.      Allergies:   Neosporin [bacitracin-polymyxin b]   Social History   Socioeconomic History   Marital status: Married    Spouse name: Not on file   Number of children: 3   Years of education: Not on file   Highest education level: Not on file  Occupational History   Occupation: Retired Landscape architect: RETIRED  Tobacco Use   Smoking status: Former    Types: Cigars    Quit date: 05/13/1961    Years since quitting: 60.5   Smokeless tobacco: Never   Tobacco comments:    "never inhaled" in the early 60's   Vaping Use   Vaping Use: Never used  Substance and Sexual Activity   Alcohol use: Yes    Alcohol/week: 14.0 standard drinks    Types: 14 Glasses of wine per week   Drug use: Never   Sexual activity: Not Currently  Other Topics Concern   Not on file  Social History Narrative   Married with 1 son and 2 daughters.  Retired.   2 caffeinated beverages daily, 3 glasses of wine daily   Never smoker no drug use no tobacco   Social Determinants of Radio broadcast assistant Strain: Low Risk    Difficulty of Paying Living Expenses: Not hard at all  Food Insecurity: No Food Insecurity   Worried About Charity fundraiser in the Last Year: Never true   Ran Out of Food in the Last Year: Never true  Transportation Needs: No Transportation Needs   Lack of Transportation (Medical): No   Lack of Transportation (Non-Medical): No  Physical Activity: Inactive   Days of Exercise per Week: 0 days   Minutes of Exercise per Session: 0 min  Stress: No Stress Concern  Present   Feeling of Stress : Not at all  Social Connections: Moderately Integrated   Frequency of Communication with Friends and Family: More than three times a week   Frequency of Social Gatherings with Friends and Family: More than three times a week   Attends Religious Services: Never   Marine scientist or Organizations: Yes   Attends Music therapist: More than 4 times per year   Marital  Status: Married     Family History: The patient's family history includes Colon polyps in his father; Deep vein thrombosis in his father and mother; Diverticulitis in his father; Hyperlipidemia in his father and mother; Hypertension in his father and mother; Stroke in his father and mother; Ulcerative colitis in his father. There is no history of Colon cancer, Esophageal cancer, Stomach cancer, or Rectal cancer.  ROS:   Please see the history of present illness.    ROS  All other systems reviewed and negative.   EKGs/Labs/Other Studies Reviewed:    The following studies were reviewed today: none  EKG:  EKG is  ordered today and demonstrates NSR with septal infarct and T wave inversions in the inferior leads  Recent Labs: 11/01/2021: ALT 25; BUN 21; Creatinine, Ser 1.06; Hemoglobin 13.4; Platelets 171.0; Potassium 3.7; Sodium 142; TSH 2.90   Recent Lipid Panel    Component Value Date/Time   CHOL 130 11/01/2021 1149   CHOL 89 (L) 09/27/2019 0810   TRIG 114.0 11/01/2021 1149   HDL 37.30 (L) 11/01/2021 1149   HDL 34 (L) 09/27/2019 0810   CHOLHDL 3 11/01/2021 1149   VLDL 22.8 11/01/2021 1149   LDLCALC 70 11/01/2021 1149   LDLCALC 59 11/02/2020 1428    Physical Exam:    VS:  BP (!) 156/80    Pulse 66    Ht 5\' 5"  (1.651 m)    Wt 149 lb 6.4 oz (67.8 kg)    SpO2 96%    BMI 24.86 kg/m     Wt Readings from Last 3 Encounters:  11/28/21 149 lb 6.4 oz (67.8 kg)  11/11/21 145 lb (65.8 kg)  11/01/21 144 lb 6.4 oz (65.5 kg)     GEN: Well nourished, well developed in no acute  distress HEENT: Normal NECK: No JVD; No carotid bruits LYMPHATICS: No lymphadenopathy CARDIAC:RRR, no murmurs, rubs, gallops RESPIRATORY:  scattered wheezes ABDOMEN: Soft, non-tender, non-distended MUSCULOSKELETAL:  No edema; No deformity  SKIN: Warm and dry NEUROLOGIC:  Alert and oriented x 3 PSYCHIATRIC:  Normal affect    ASSESSMENT:    1. Coronary artery disease involving native coronary artery of native heart without angina pectoris   2. Essential hypertension   3. Bilateral carotid artery stenosis   4. Pure hypercholesterolemia     PLAN:    In order of problems listed above:  1.  ASCAD -s/p PCI of the RCA -EKG today shows new T wave inversions in aVF and III but he has not anginal complaints. On review of old EKGs he has had nonspecific St abnormality on other prior EKGs.  At this time no further workup unless he becomes symptomatic -continue ASA, BB, statin  2.  HTN -BP is borderline controlled on exam today but just took his BP meds an hour ago -continue prescription drug management with Carvedilol 6.25mg  BID, HCTZ 25mg  daily and Amlodipine 5mg  daily with PRN refills -I have personally reviewed and interpreted outside labs performed by patient's PCP which showed SCr 1.06, K+ 3.7 on  11/01/21  -check BP daily for a week and call with results  3.  Bilateral carotid artery stenosis -dopplers 4/22 showed 1-39% right and 40-59% left carotid stenosis -repeat dopplers 01/2022 -continue ASA and statin  4.  HLD -LDL goal < 70. -I have personally reviewed and interpreted outside labs performed by patient's PCP which showed LDL 70, HDL 37, ALT 25 on 11/01/2021  -continue prescription drug management with Atorvastatin 80mg  daily with PRN refills  Medication Adjustments/Labs and Tests Ordered: Current medicines are reviewed at length with the patient today.  Concerns regarding medicines are outlined above.  Orders Placed This Encounter  Procedures   EKG 12-Lead   No orders  of the defined types were placed in this encounter.   Signed, Fransico Him, MD  11/28/2021 10:23 AM    [place]

## 2021-11-28 NOTE — Addendum Note (Signed)
Addended by: Theresia Majors on: 11/28/2021 10:31 AM   Modules accepted: Orders

## 2021-11-28 NOTE — Patient Instructions (Signed)
Medication Instructions:  Your physician recommends that you continue on your current medications as directed. Please refer to the Current Medication list given to you today.  *If you need a refill on your cardiac medications before your next appointment, please call your pharmacy*  Testing/Procedures: Your physician has requested that you have a carotid duplex. This test is an ultrasound of the carotid arteries in your neck. It looks at blood flow through these arteries that supply the brain with blood. Allow one hour for this exam. There are no restrictions or special instructions.  Follow-Up: At [facility], you and your health needs are our priority.  As part of our continuing mission to provide you with exceptional heart care, we have created designated Provider Care Teams.  These Care Teams include your primary Cardiologist (physician) and Advanced Practice Providers (APPs -  Physician Assistants and Nurse Practitioners) who all work together to provide you with the care you need, when you need it.  Your next appointment:   1 year(s)  The format for your next appointment:   In Person  Provider:   Traci Turner, MD   

## 2021-11-29 ENCOUNTER — Ambulatory Visit: Payer: Medicare Other | Admitting: Cardiology

## 2021-12-02 ENCOUNTER — Encounter (HOSPITAL_COMMUNITY): Payer: Medicare Other

## 2021-12-02 NOTE — Progress Notes (Unsigned)
Chronic Care Management Pharmacy Note  12/02/2021 Name:  [name] MRN:  [id number] DOB:  [birth year]  Plan:  Subjective: [name] is an 87 y.o. year old male who is a primary patient of Tabori, Aundra Millet, MD.  The CCM team was consulted for assistance with disease management and care coordination needs.    Engaged with patient by telephone for initial visit in response to provider referral for pharmacy case management and/or care coordination services.   Consent to Services:  The patient was given information about Chronic Care Management services, agreed to services, and gave verbal consent prior to initiation of services.  Please see initial visit note for detailed documentation.   Patient Care Team: Midge Minium, MD as PCP - General Sueanne Margarita, MD as PCP - Cardiology (Cardiology) Sueanne Margarita, MD as Consulting Physician (Cardiology) Angelia Mould, MD as Consulting Physician (Vascular Surgery) Festus Aloe, MD as Consulting Physician (Urology) Gatha Mayer, MD as Consulting Physician (Gastroenterology) Garner Nash, DO as Consulting Physician (Pulmonary Disease) Chrystine Oiler, MD as Consulting Physician (Ophthalmology) Madelin Rear, [facility] as Pharmacist (Pharmacist)  Objective:  Lab Results  Component Value Date   CREATININE 1.06 11/01/2021   CREATININE 1.20 05/03/2021   CREATININE 1.09 11/02/2020    No results found for: HGBA1C Last diabetic Eye exam: No results found for: HMDIABEYEEXA  Last diabetic Foot exam: No results found for: HMDIABFOOTEX      Component Value Date/Time   CHOL 130 11/01/2021 1149   CHOL 89 (L) 09/27/2019 0810   TRIG 114.0 11/01/2021 1149   HDL 37.30 (L) 11/01/2021 1149   HDL 34 (L) 09/27/2019 0810   CHOLHDL 3 11/01/2021 1149   VLDL 22.8 11/01/2021 1149   [place] 70 11/01/2021 1149   [place] 59 11/02/2020 1428    Hepatic Function Latest Ref Rng & Units 11/01/2021 05/03/2021 11/02/2020  Total  Protein 6.0 - 8.3 g/dL 6.9 6.9 6.8  Albumin 3.5 - 5.2 g/dL 4.2 4.4 -  AST 0 - 37 U/L 23 24 32  ALT 0 - 53 U/L _0 Alk Phosphatase 39 - 117 U/L 78 76 -  Total Bilirubin 0.2 - 1.2 mg/dL 0.6 0.9 0.7  Bilirubin, Direct 0.0 - 0.3 mg/dL 0.1 0.1 0.2    Lab Results  Component Value Date/Time   TSH 2.90 11/01/2021 11:49 AM   TSH 3.51 05/03/2021 10:46 AM    CBC Latest Ref Rng & Units 11/01/2021 05/03/2021 11/02/2020  WBC 4.0 - 10.5 K/uL 9.5 7.5 8.4  Hemoglobin 13.0 - 17.0 g/dL 13.4 14.0 13.3  Hematocrit 39.0 - 52.0 % 40.7 42.0 39.8  Platelets 150.0 - 400.0 K/uL 171.0 169.0 217    No results found for: VD25OH  Social History   Tobacco Use  Smoking Status Former   Types: Cigars   Quit date: 05/13/1961   Years since quitting: 60.5  Smokeless Tobacco Never  Tobacco Comments   "never inhaled" in the early 60's    BP Readings from Last 3 Encounters:  11/28/21 (!) 156/80  11/11/21 126/60  11/01/21 112/76   Pulse Readings from Last 3 Encounters:  11/28/21 66  11/11/21 62  11/01/21 62   Wt Readings from Last 3 Encounters:  11/28/21 149 lb 6.4 oz (67.8 kg)  11/11/21 145 lb (65.8 kg)  11/01/21 144 lb 6.4 oz (65.5 kg)    Assessment: Review of patient past medical history, allergies, medications, health status, including review of consultants reports, laboratory and other  test data, was performed as part of comprehensive evaluation and provision of chronic care management services.   SDOH:  (Social Determinants of Health) assessments and interventions performed: Yes   CCM Care Plan  Allergies  Allergen Reactions   Neosporin [Bacitracin-Polymyxin B] Other (See Comments)    Redness    Medications Reviewed Today     Reviewed by Juventino Slovak, CMA (Certified Medical Assistant) on 11/28/21 at 1017  Med List Status: <None>   Medication Order Taking? Sig Documenting Provider Last Dose Status Informant  acetaminophen (TYLENOL) 500 MG tablet [id number] Yes Take 500 mg by mouth 2  (two) times daily. [provider] Taking Active Spouse/Significant Other  albuterol (PROVENTIL) (2.5 MG/3ML) 0.083% nebulizer solution [id number] Yes Take 3 mLs (2.5 mg total) by nebulization every 6 (six) hours as needed for wheezing or shortness of breath. Martyn Ehrich, NP Taking Active   albuterol (VENTOLIN HFA) 108 (90 Base) MCG/ACT inhaler [id number] Yes INHALE 2 PUFFS INTO LUNGS EVERY 6 HOURS AS NEEDED FOR WHEEZING OR SHORTNESS OF BREATH. Martyn Ehrich, NP Taking Active   amLODipine (NORVASC) 5 MG tablet [id number] Yes Take 1 tablet (5 mg total) by mouth daily. Please make yearly appt with Dr. Radford Pax for January 2023 before anymore refills. Thank you 1st attempt Sueanne Margarita, MD Taking Active   aspirin EC 81 MG tablet [id number] Yes Take 81 mg by mouth daily. [provider] Taking Active Spouse/Significant Other  atorvastatin (LIPITOR) 80 MG tablet [id number] Yes Take 1 tablet (80 mg total) by mouth daily. Please make overdue appt with Dr. Radford Pax before anymore refills. Thank you 1st attempt Sueanne Margarita, MD Taking Active   azelastine (ASTELIN) 0.1 % nasal spray [id number] Yes Place 1 spray into both nostrils 2 (two) times daily. Garner Nash, DO Taking Active   carvedilol (COREG) 6.25 MG tablet [id number] Yes Take 1 tablet (6.25 mg total) by mouth 2 (two) times daily with a meal. Turner, Eber Hong, MD Taking Active   cholecalciferol (VITAMIN D3) 25 MCG (1000 UT) tablet [id number] Yes Take 1,000 Units by mouth daily. [provider] Taking Active   donepezil (ARICEPT) 10 MG tablet [id number] Yes Take 1 tablet (10 mg total) by mouth daily. Midge Minium, MD Taking Active   Eyelid Cleansers (OCUSOFT EYELID CLEANSING) PADS [id number] Yes Place 1 application into both eyes 2 (two) times daily. [provider] Taking Active Spouse/Significant Other  fish oil-omega-3 fatty acids 1000 MG capsule [id number] Yes Take 2 g by mouth 2 (two) times daily.   [provider] Taking Active Spouse/Significant Other  fluticasone (FLONASE) 50 MCG/ACT nasal spray [id number] Yes 1 spray each nostril twice daily Martyn Ehrich, NP Taking Active   fluticasone furoate-vilanterol (BREO ELLIPTA) 100-25 MCG/INH AEPB [id number] Yes Inhale 1 puff into the lungs daily. Martyn Ehrich, NP Taking Active   fluticasone furoate-vilanterol (BREO ELLIPTA) 100-25 MCG/INH AEPB [id number] Yes INHALE 1 PUFF INTO THE LUNGS DAILY Icard, Octavio Graves, DO Taking Active   gabapentin (NEURONTIN) 100 MG capsule [id number] Yes Take 2 capsules (200 mg total) by mouth at bedtime. Lyndal Pulley, DO Taking Active   gabapentin (NEURONTIN) 300 MG capsule [id number] Yes TAKE 1 CAPSULE(300 MG) BY MOUTH THREE TIMES DAILY Lyndal Pulley, DO Taking Active   hydrochlorothiazide (HYDRODIURIL) 25 MG tablet [id number] Yes Take 1 tablet (25 mg total) by mouth daily. Sueanne Margarita, MD Taking Active   montelukast (SINGULAIR) 10 MG tablet [id number] Yes TAKE 1 TABLET BY MOUTH  AT BEDTIME. GENERIC EQUIVALENT FOR SINGULAIR. Martyn Ehrich, NP Taking Active   ofloxacin (OCUFLOX) 0.3 % ophthalmic solution [id number] Yes INSTILL 1 DROP IN LEFT EYE FOUR TIMES DAILY FOR 7 DAYS BEGINNING AFTER SURGERY [provider] Taking Active   Polyvinyl Alcohol-Povidone (REFRESH OP) [id number] Yes Place 1 drop into both eyes 2 (two) times daily. [provider] Taking Active Spouse/Significant Other  Saw Palmetto 500 MG CAPS [id number] Yes Take 1,000 mg by mouth 2 (two) times daily.  [provider] Taking Active Spouse/Significant Other            Patient Active Problem List   Diagnosis Date Noted   Right hip pain 09/16/2021   Conductive hearing loss of right ear with unrestricted hearing of left ear 03/19/2021   Piriformis syndrome of right side 08/07/2020   Chronic maxillary sinusitis 08/03/2020   Hamstring tendinitis of left thigh 03/20/2020   Nasal polyps 06/07/2019    Mild asthma 11/15/2018   Rhinitis, chronic 11/15/2018   History of ETT 10/23/2018   Globus sensation 07/29/2018   Preoperative cardiovascular examination 04/16/2018   Arthropathy of right sacroiliac joint 04/08/2016   Spinal stenosis, lumbar region, with neurogenic claudication 03/26/2016   Sciatica of right side 06/13/2015   CAD (coronary artery disease)    Rotator cuff syndrome of left shoulder 01/27/2013   Carotid artery stenosis 09/02/2012   Physical exam, annual 06/23/2012   Chronic cough 11/23/2009   SKIN LESIONS, MULTIPLE 06/05/2009   SYNDROME, 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   DISEASE, PERIPHERAL VASCULAR NOS 08/02/2007   VERTIGO 08/02/2007   Mild cognitive impairment 04/27/2007   Hyperlipidemia 03/17/2007   Essential hypertension 03/17/2007    Immunization History  Administered Date(s) Administered   Fluad Quad(high Dose 65+) 06/24/2019   Influenza Split 08/25/2011, 08/09/2012   Influenza Whole 07/11/2010   Influenza, High Dose Seasonal PF 08/14/2014, 07/16/2018   Influenza,inj,Quad PF,6+ Mos 08/11/2013, 08/18/2014, 07/11/2015, 07/14/2016, 07/20/2017   Influenza-Unspecified 07/24/2000   PFIZER(Purple Top)SARS-COV-2 Vaccination 11/15/2019, 12/06/2019, 07/27/2020, 03/08/2021   Pneumococcal Conjugate-13 01/25/2015   Pneumococcal Polysaccharide-23 09/15/2002   Td 12/13/2007, 05/31/2019   Zoster Recombinat (Shingrix) 05/29/2020, 08/14/2020   Zoster, Live 10/29/2010    Conditions to be addressed/monitored: HLD, CAD (PCI of RCA), HTN, mild asthma   There are no care plans that you recently modified to display for this patient.   Follow Up Plan:  CPA pt call two months to ensure coverage needs met before/plan for open enrollment. Rph f/u 6 months  Medication Assistance:  TBD  Patient's preferred pharmacy is:  [facility]) [place], [place] [address] Phone:  [phone] Fax: [phone]  [id number] - [address], [place] AT [facility] [address] [place] [address] Phone: [phone] Fax: [phone]   Follow Up:  Patient agrees to Care Plan and Follow-up.  Future Appointments  Date Time Provider [place]  12/11/2021  1:00 PM [facility] CCM PHARMACIST [facility]  12/23/2021 11:00 AM Lyndal Pulley, DO [facility] None  02/10/2022 10:00 AM [facility] NL VASC 3 [facility]  05/12/2022 10:00 AM Midge Minium, MD [facility] P[facility]       Current Barriers:  Cost of current Breo inhaler  Pharmacist Clinical Goal(s):  Patient will verbalize ability to afford treatment regimen contact provider office for questions/concerns as evidenced notation of same in electronic health record through collaboration  with PharmD and provider.   Interventions: 1:1 collaboration with Midge Minium, MD regarding development and update of comprehensive plan of care as evidenced by provider attestation and co-signature Inter-disciplinary care team collaboration (see longitudinal plan of care) Comprehensive medication review performed; medication list updated in electronic medical record  Hypertension  (Status:New goal.)   Med Management Intervention: no changes  (BP goal <130/80) -Controlled -Current treatment: HCTZ 25 mg once daily  Carvedilol 6.25 mg twice daily Amlodipine 5 mg once daily  -Current home readings: not checking -Current exercise habits: exercise very limited due to back pain  -Denies hypotensive/hypertensive symptoms -Educated on Symptoms of hypotension and importance of maintaining adequate hydration; -Counseled to monitor BP at home as directed, document, and provide log at future appointments -Counseled on diet and exercise extensively Recommended to continue current medication  Hyperlipidemia: (LDL goal <100 -Controlled -Current treatment: Atorvastatin 80 mg  once daily -Medications previously tried: Zetia, Lovaza  -Educated on Cholesterol goals;  -Recommended to continue current medication  Asthma (Goal: control symptoms and prevent exacerbations) -Controlled -Concern with 90+ dollar copay while in donut hole  -Current treatment  Breo Ellipta 100-25 mcg/act 1 puff daily Ventolin - 2 puffs every 6 hours as needed for SOB -Medications previously tried: n/a  -Patient denies consistent use of maintenance inhaler -Counseled on Proper inhaler technique; Benefits of consistent maintenance inhaler use -Recommended to continue current medication Assessed patient finances. Does not qualify for PAP. Reviewed alternative ICS/LABA inhalers through insurance - [employer] would still be cheapest option for out of pocket spending both before and during coverage gap. Given this, I would suggest patient contact [facility]  (Toll Free [phone]) as they deal with multiple assistance programs/plans. Another option would be for patient to apply to a foundation such as [employer] (currently wait list only for asthma) to receive financial support with medication copays  Patient Goals/Self-Care Activities Patient will:  - take medications as prescribed collaborate with provider on medication access solutions

## 2021-12-06 ENCOUNTER — Telehealth: Payer: Self-pay

## 2021-12-06 NOTE — Telephone Encounter (Signed)
Caller name:Kennard Madaline Savage   On DPR? :Yes  Call back number:[phone]  Provider they see:   Reason for call: Pt is wanting to know if appt can be moved to earlier that day before 12:00 for Pharmacy appt 02/15

## 2021-12-06 NOTE — Telephone Encounter (Signed)
Whenever we can fit him in for his phone call is fine that works for him

## 2021-12-11 ENCOUNTER — Telehealth: Payer: Medicare Other

## 2021-12-11 ENCOUNTER — Ambulatory Visit (INDEPENDENT_AMBULATORY_CARE_PROVIDER_SITE_OTHER): Payer: Medicare Other | Admitting: Pharmacist

## 2021-12-11 DIAGNOSIS — J45909 Unspecified asthma, uncomplicated: Secondary | ICD-10-CM

## 2021-12-11 DIAGNOSIS — I10 Essential (primary) hypertension: Secondary | ICD-10-CM

## 2021-12-11 NOTE — Patient Instructions (Addendum)
Visit Information   Goals Addressed             This Visit's Progress    Track and Manage My Blood Pressure-Hypertension       Timeframe:  Long-Range Goal Priority:  High Start Date:  12/11/21                            Expected End Date:  06/10/22                     Follow Up Date 03/10/22    - check blood pressure 3 times per week - choose a place to take my blood pressure (home, clinic or office, retail store) - write blood pressure results in a log or diary    Why is this important?   You won't feel high blood pressure, but it can still hurt your blood vessels.  High blood pressure can cause heart or kidney problems. It can also cause a stroke.  Making lifestyle changes like losing a little weight or eating less salt will help.  Checking your blood pressure at home and at different times of the day can help to control blood pressure.  If the doctor prescribes medicine remember to take it the way the doctor ordered.  Call the office if you cannot afford the medicine or if there are questions about it.     Notes:        Patient Care Plan: ccm pharmacy care plan     Problem Identified: HLD, CAD (PCI of RCA), HTN, mild asthma   Priority: High     Long-Range Goal: Patient-Specific Goal   Start Date: 05/29/2021  Expected End Date: 05/29/2022  Recent Progress: On track  Priority: High  Note:   Current Barriers:  Cost of current Breo inhaler/possible elevated BP  Pharmacist Clinical Goal(s):  Patient will verbalize ability to afford treatment regimen contact provider office for questions/concerns as evidenced notation of same in electronic health record through collaboration with PharmD and provider.   Interventions: 1:1 collaboration with Sheliah Hatch, MD regarding development and update of comprehensive plan of care as evidenced by provider attestation and co-signature Inter-disciplinary care team collaboration (see longitudinal plan of care) Comprehensive  medication review performed; medication list updated in electronic medical record  Hypertension  (Status:New goal.)   Med Management Intervention: no changes  (BP goal <130/80) -Controlled -Current treatment: HCTZ 25 mg once daily Appropriate, Query effective, ,  Carvedilol 6.25 mg twice daily Appropriate, Effective, Safe, Accessible Amlodipine 5 mg once daily Appropriate, Query effective,  -Current home readings: not checking -Current exercise habits: exercise very limited due to back pain  -Denies hypotensive/hypertensive symptoms -Educated on Symptoms of hypotension and importance of maintaining adequate hydration; -Counseled to monitor BP at home as directed, document, and provide log at future appointments -Counseled on diet and exercise extensively Recommended to continue current medication  Update 12/11/21 Home BP readings recently - 139/63, 139/57, 149/60 Recent office BP reading elevated 150s systolic. He denies any symptoms at home of elevated BP. Have asked him to continue to monitor and record.  If BP continues to be elevated Could consider increased dose of amlodipine to 10mg  daily. Will FU in two weeks on BP readings. Patient to call if they are consistently elevated in the 140s systolic.  Hyperlipidemia: (LDL goal <100 -Controlled -Current treatment: Atorvastatin 80 mg once daily -Medications previously tried: Zetia, Lovaza  -Educated on Cholesterol goals;  -  Recommended to continue current medication  Asthma (Goal: control symptoms and prevent exacerbations) -Controlled -Concern with 90+ dollar copay while in donut hole  -Current treatment  Breo Ellipta 100-25 mcg/act 1 puff daily Appropriate, Effective, Safe, Accessible Ventolin - 2 puffs every 6 hours as needed for SOB Appropriate, Effective, Safe, Accessible -Medications previously tried: n/a  -Patient denies consistent use of maintenance inhaler -Counseled on Proper inhaler technique; Benefits of  consistent maintenance inhaler use -Recommended to continue current medication Assessed patient finances. Does not qualify for PAP. Reviewed alternative ICS/LABA inhalers through insurance - Virgel Bouquet would still be cheapest option for out of pocket spending both before and during coverage gap. Given this, I would suggest patient contact [employer]  (Toll Free [phone]) as they deal with multiple assistance programs/plans. Another option would be for patient to apply to a foundation such as [employer] (currently wait list only for asthma) to receive financial support with medication copays  Update 12/11/21 He reports using his albuterol inhaler about once daily in the evening.  Discussed importance of using maintenance inhaler daily and albuterol as rescue.  Reports copay for Virgel Bouquet remains the same - he is unsure of what will happen this year as he got new insurance. Encouraged him to contact us if copay is any different and we can look for alternatives that may be better. No changes to medications at this time - continue to follow for adherence and copay assistance. Provided direct line for contact regarding medication questions.  Patient Goals/Self-Care Activities Patient will:  - take medications as prescribed collaborate with provider on medication access solutions          Patient verbalizes understanding of instructions and care plan provided today and agrees to view in MyChart. Active MyChart status confirmed with patient.   Telephone follow up appointment with pharmacy team member scheduled for: 6 months  Erroll Luna, [facility]  Willa Frater, PharmD Clinical Pharmacist  [facility] [phone]

## 2021-12-11 NOTE — Progress Notes (Signed)
° ° °Chronic Care Management °Pharmacy Note ° °12/11/2021 °Name:  [name] MRN:  [id number] DOB:  05/06/1935 ° °Plan: ° °Subjective: °[name] is an 87 y.o. year old male who is a primary patient of Tabori, Katherine E, MD.  The CCM team was consulted for assistance with disease management and care coordination needs.   ° °Engaged with patient by telephone for follow up visit in response to provider referral for pharmacy case management and/or care coordination services.  ° °Consent to Services:  °The patient was given information about Chronic Care Management services, agreed to services, and gave verbal consent prior to initiation of services.  Please see initial visit note for detailed documentation.  ° °Patient Care Team: °Tabori, Katherine E, MD as PCP - General °Turner, Traci R, MD as PCP - Cardiology (Cardiology) °Turner, Traci R, MD as Consulting Physician (Cardiology) °Dickson, Christopher S, MD as Consulting Physician (Vascular Surgery) °Eskridge, Matthew, MD as Consulting Physician (Urology) °Gessner, Carl E, MD as Consulting Physician (Gastroenterology) °Icard, Bradley L, DO as Consulting Physician (Pulmonary Disease) °Williams, Andrew Mitchell, MD as Consulting Physician (Ophthalmology) °,  L, RPH (Pharmacist) ° °Objective: ° °Lab Results  °Component Value Date  ° CREATININE 1.06 11/01/2021  ° CREATININE 1.20 05/03/2021  ° CREATININE 1.09 11/02/2020  ° ° °No results found for: HGBA1C °Last diabetic Eye exam: No results found for: HMDIABEYEEXA  °Last diabetic Foot exam: No results found for: HMDIABFOOTEX  ° °   °Component Value Date/Time  ° CHOL 130 11/01/2021 1149  ° CHOL 89 (L) 09/27/2019 0810  ° TRIG 114.0 11/01/2021 1149  ° HDL 37.30 (L) 11/01/2021 1149  ° HDL 34 (L) 09/27/2019 0810  ° CHOLHDL 3 11/01/2021 1149  ° VLDL 22.8 11/01/2021 1149  ° LDLCALC 70 11/01/2021 1149  ° LDLCALC 59 11/02/2020 1428  ° ° °Hepatic Function Latest Ref Rng & Units 11/01/2021 05/03/2021 11/02/2020  °Total  Protein 6.0 - 8.3 g/dL 6.9 6.9 6.8  °Albumin 3.5 - 5.2 g/dL 4.2 4.4 -  °AST 0 - 37 U/L 23 24 32  °ALT 0 - 53 U/L 25 25 29  °Alk Phosphatase 39 - 117 U/L 78 76 -  °Total Bilirubin 0.2 - 1.2 mg/dL 0.6 0.9 0.7  °Bilirubin, Direct 0.0 - 0.3 mg/dL 0.1 0.1 0.2  ° ° °Lab Results  °Component Value Date/Time  ° TSH 2.90 11/01/2021 11:49 AM  ° TSH 3.51 05/03/2021 10:46 AM  ° ° °CBC Latest Ref Rng & Units 11/01/2021 05/03/2021 11/02/2020  °WBC 4.0 - 10.5 K/uL 9.5 7.5 8.4  °Hemoglobin 13.0 - 17.0 g/dL 13.4 14.0 13.3  °Hematocrit 39.0 - 52.0 % 40.7 42.0 39.8  °Platelets 150.0 - 400.0 K/uL 171.0 169.0 217  ° ° °No results found for: VD25OH ° °Social History  ° °Tobacco Use  °Smoking Status Former  ° Types: Cigars  ° Quit date: 05/13/1961  ° Years since quitting: 60.6  °Smokeless Tobacco Never  °Tobacco Comments  ° "never inhaled" in the early 60's   ° °BP Readings from Last 3 Encounters:  °11/28/21 (!) 156/80  °11/11/21 126/60  °11/01/21 112/76  ° °Pulse Readings from Last 3 Encounters:  °11/28/21 66  °11/11/21 62  °11/01/21 62  ° °Wt Readings from Last 3 Encounters:  °11/28/21 149 lb 6.4 oz (67.8 kg)  °11/11/21 145 lb (65.8 kg)  °11/01/21 144 lb 6.4 oz (65.5 kg)  ° ° °Assessment: Review of patient past medical history, allergies, medications, health status, including review of consultants reports, laboratory and other   test data, was performed as part of comprehensive evaluation and provision of chronic care management services.  ° °SDOH:  (Social Determinants of Health) assessments and interventions performed: Yes ° ° °CCM Care Plan ° °Allergies  °Allergen Reactions  ° Neosporin [Bacitracin-Polymyxin B] Other (See Comments)  °  Redness  ° ° °Medications Reviewed Today   ° ° Reviewed by Marylen Zuk L, RPH (Pharmacist) on 12/11/21 at 1125  Med List Status: <None>  ° °Medication Order Taking? Sig Documenting Provider Last Dose Status Informant  °acetaminophen (TYLENOL) 500 MG tablet [id number] Yes Take 500 mg by mouth 2 (two) times  daily. [provider] Taking Active Spouse/Significant Other  °albuterol (PROVENTIL) (2.5 MG/3ML) 0.083% nebulizer solution [id number] Yes Take 3 mLs (2.5 mg total) by nebulization every 6 (six) hours as needed for wheezing or shortness of breath. Walsh, Elizabeth W, NP Taking Active   °albuterol (VENTOLIN HFA) 108 (90 Base) MCG/ACT inhaler [id number] Yes INHALE 2 PUFFS INTO LUNGS EVERY 6 HOURS AS NEEDED FOR WHEEZING OR SHORTNESS OF BREATH. Walsh, Elizabeth W, NP Taking Active   °amLODipine (NORVASC) 5 MG tablet [id number] Yes Take 1 tablet (5 mg total) by mouth daily. Please make yearly appt with Dr. Turner for January 2023 before anymore refills. Thank you 1st attempt Turner, Traci R, MD Taking Active   °aspirin EC 81 MG tablet [id number] Yes Take 81 mg by mouth daily. [provider] Taking Active Spouse/Significant Other  °atorvastatin (LIPITOR) 80 MG tablet [id number] Yes Take 1 tablet (80 mg total) by mouth daily. Please make overdue appt with Dr. Turner before anymore refills. Thank you 1st attempt Turner, Traci R, MD Taking Active   °azelastine (ASTELIN) 0.1 % nasal spray [id number] Yes Place 1 spray into both nostrils 2 (two) times daily. Icard, Bradley L, DO Taking Active   °carvedilol (COREG) 6.25 MG tablet [id number] Yes Take 1 tablet (6.25 mg total) by mouth 2 (two) times daily with a meal. Turner, Traci R, MD Taking Active   °cholecalciferol (VITAMIN D3) 25 MCG (1000 UT) tablet [id number] Yes Take 1,000 Units by mouth daily. [provider] Taking Active   °donepezil (ARICEPT) 10 MG tablet [id number] Yes Take 1 tablet (10 mg total) by mouth daily. Tabori, Katherine E, MD Taking Active   °Eyelid Cleansers (OCUSOFT EYELID CLEANSING) PADS [id number] Yes Place 1 application into both eyes 2 (two) times daily. [provider] Taking Active Spouse/Significant Other  °fish oil-omega-3 fatty acids 1000 MG capsule [id number] Yes Take 2 capsules (2 g total) by mouth 2 (two) times daily.  Turner, Traci R, MD Taking Active   °fluticasone (FLONASE) 50 MCG/ACT nasal spray [id number] Yes 1 spray each nostril twice daily Walsh, Elizabeth W, NP Taking Active   °fluticasone furoate-vilanterol (BREO ELLIPTA) 100-25 MCG/INH AEPB [id number] Yes Inhale 1 puff into the lungs daily. Walsh, Elizabeth W, NP Taking Active   °fluticasone furoate-vilanterol (BREO ELLIPTA) 100-25 MCG/INH AEPB [id number] Yes INHALE 1 PUFF INTO THE LUNGS DAILY Icard, Bradley L, DO Taking Active   °gabapentin (NEURONTIN) 100 MG capsule [id number] Yes Take 2 capsules (200 mg total) by mouth at bedtime. Smith, Zachary M, DO Taking Active   °gabapentin (NEURONTIN) 300 MG capsule [id number] Yes TAKE 1 CAPSULE(300 MG) BY MOUTH THREE TIMES DAILY Smith, Zachary M, DO Taking Active   °hydrochlorothiazide (HYDRODIURIL) 25 MG tablet [id number] Yes Take 1 tablet (25 mg total) by mouth daily. Turner, Traci R, MD Taking Active   °montelukast (SINGULAIR) 10 MG tablet [id number] Yes TAKE 1 TABLET BY MOUTH   MOUTH AT BEDTIME. GENERIC EQUIVALENT FOR SINGULAIR. Martyn Ehrich, NP Taking Active   ofloxacin (OCUFLOX) 0.3 % ophthalmic solution [id number] Yes INSTILL 1 DROP IN LEFT EYE FOUR TIMES DAILY FOR 7 DAYS BEGINNING AFTER SURGERY [provider] Taking Active   Polyvinyl Alcohol-Povidone (REFRESH OP) [id number] Yes Place 1 drop into both eyes 2 (two) times daily. [provider] Taking Active Spouse/Significant Other  Saw Palmetto 500 MG CAPS [id number] Yes Take 1,000 mg by mouth 2 (two) times daily.  [provider] Taking Active Spouse/Significant Other            Patient Active Problem List   Diagnosis Date Noted   Right hip pain 09/16/2021   Conductive hearing loss of right ear with unrestricted hearing of left ear 03/19/2021   Piriformis syndrome of right side 08/07/2020   Chronic maxillary sinusitis 08/03/2020   Hamstring tendinitis of left thigh 03/20/2020   Nasal polyps 06/07/2019   Mild asthma 11/15/2018    Rhinitis, chronic 11/15/2018   History of ETT 10/23/2018   Globus sensation 07/29/2018   Preoperative cardiovascular examination 04/16/2018   Arthropathy of right sacroiliac joint 04/08/2016   Spinal stenosis, lumbar region, with neurogenic claudication 03/26/2016   Sciatica of right side 06/13/2015   CAD (coronary artery disease)    Rotator cuff syndrome of left shoulder 01/27/2013   Carotid artery stenosis 09/02/2012   Physical exam, annual 06/23/2012   Chronic cough 11/23/2009   SKIN LESIONS, MULTIPLE 06/05/2009   SYNDROME, 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   DISEASE, PERIPHERAL VASCULAR NOS 08/02/2007   VERTIGO 08/02/2007   Mild cognitive impairment 04/27/2007   Hyperlipidemia 03/17/2007   Essential hypertension 03/17/2007    Immunization History  Administered Date(s) Administered   Fluad Quad(high Dose 65+) 06/24/2019   Influenza Split 08/25/2011, 08/09/2012   Influenza Whole 07/11/2010   Influenza, High Dose Seasonal PF 08/14/2014, 07/16/2018   Influenza,inj,Quad PF,6+ Mos 08/11/2013, 08/18/2014, 07/11/2015, 07/14/2016, 07/20/2017   Influenza-Unspecified 07/24/2000   PFIZER(Purple Top)SARS-COV-2 Vaccination 11/15/2019, 12/06/2019, 07/27/2020, 03/08/2021   Pneumococcal Conjugate-13 01/25/2015   Pneumococcal Polysaccharide-23 09/15/2002   Td 12/13/2007, 05/31/2019   Zoster Recombinat (Shingrix) 05/29/2020, 08/14/2020   Zoster, Live 10/29/2010    Conditions to be addressed/monitored: HLD, CAD (PCI of RCA), HTN, mild asthma   Care Plan : ccm pharmacy care plan  Updates made by Edythe Clarity, RPH since 12/11/2021 12:00 AM     Problem: HLD, CAD (PCI of RCA), HTN, mild asthma   Priority: High     Long-Range Goal: Patient-Specific Goal   Start Date: 05/29/2021  Expected End Date: 05/29/2022  Recent Progress: On track  Priority: High  Note:   Current Barriers:  Cost of current Breo inhaler/possible elevated BP  Pharmacist Clinical Goal(s):  Patient will  verbalize ability to afford treatment regimen contact provider office for questions/concerns as evidenced notation of same in electronic health record through collaboration with PharmD and provider.   Interventions: 1:1 collaboration with Midge Minium, MD regarding development and update of comprehensive plan of care as evidenced by provider attestation and co-signature Inter-disciplinary care team collaboration (see longitudinal plan of care) Comprehensive medication review performed; medication list updated in electronic medical record  Hypertension  (Status:New goal.)   Med Management Intervention: no changes  (BP goal <130/80) -Controlled -Current treatment: HCTZ 25 mg once daily Appropriate, Query effective, ,  Carvedilol 6.25 mg twice daily Appropriate, Effective, Safe, Accessible Amlodipine 5 mg once daily Appropriate, Query effective,  -Current home readings: not checking -Current exercise habits: exercise  limited due to back pain  °-Denies hypotensive/hypertensive symptoms °-Educated on Symptoms of hypotension and importance of maintaining adequate hydration; °-Counseled to monitor BP at home as directed, document, and provide log at future appointments °-Counseled on diet and exercise extensively °Recommended to continue current medication ° °Update 12/11/21 °Home BP readings recently - 139/63, 139/57, 149/60 °Recent office BP reading elevated 150s systolic. °He denies any symptoms at home of elevated BP. °Have asked him to continue to monitor and record.  If BP continues to be elevated °Could consider increased dose of amlodipine to 10mg daily. °Will FU in two weeks on BP readings. °Patient to call if they are consistently elevated in the 140s systolic. ° °Hyperlipidemia: (LDL goal <100 °-Controlled °-Current treatment: °Atorvastatin 80 mg once daily °-Medications previously tried: Zetia, Lovaza  °-Educated on Cholesterol goals;  °-Recommended to continue current  medication ° °Asthma (Goal: control symptoms and prevent exacerbations) °-Controlled °-Concern with 90+ dollar copay while in donut hole  °-Current treatment  °Breo Ellipta 100-25 mcg/act 1 puff daily Appropriate, Effective, Safe, Accessible °Ventolin - 2 puffs every 6 hours as needed for SOB Appropriate, Effective, Safe, Accessible °-Medications previously tried: n/a  °-Patient denies consistent use of maintenance inhaler °-Counseled on Proper inhaler technique; °Benefits of consistent maintenance inhaler use °-Recommended to continue current medication °Assessed patient finances. Does not qualify for PAP. Reviewed alternative ICS/LABA inhalers through insurance - Breo would still be cheapest option for out of pocket spending both before and during coverage gap. Given this, I would suggest patient contact [employer]  (Toll Free [phone]) as they deal with multiple assistance programs/plans. Another option would be for patient to apply to a foundation such as [employer] (currently wait list only for asthma) to receive financial support with medication copays ° °Update 12/11/21 °He reports using his albuterol inhaler about once daily in the evening.  Discussed importance of using maintenance inhaler daily and albuterol as rescue.  Reports copay for Breo remains the same - he is unsure of what will happen this year as he got new insurance. °Encouraged him to contact us if copay is any different and we can look for alternatives that may be better. °No changes to medications at this time - continue to follow for adherence and copay assistance. °Provided direct line for contact regarding medication questions. ° °Patient Goals/Self-Care Activities °Patient will:  °- take medications as prescribed °collaborate with provider on medication access solutions ° °  ° °  ° ° °Follow Up Plan:  CPA pt call two months to ensure coverage needs met before/plan for open enrollment. Rph f/u 6 months ° °Medication Assistance:   TBD ° °Patient's preferred pharmacy is: ° °[facility] - [place], [place] - [address] °[address] °[place] [address] °Phone: [phone] Fax: [phone] ° °[id number] - [place], [place] - [address] AT [address] °[address] °[place] [address] °Phone: [phone] Fax: [phone] ° ° °Follow Up:  Patient agrees to Care Plan and Follow-up. ° °Future Appointments  °Date Time Provider Department Center  °12/23/2021 11:00 AM Smith, Zachary M, DO [facility] None  °02/10/2022 10:00 AM [facility]  °05/12/2022 10:00 AM Tabori, Katherine E, MD [facility]  ° ° ° ° ° ° ° °

## 2021-12-20 NOTE — Progress Notes (Signed)
[place] [facility] Phone: [phone] Subjective:   [name], am serving as a scribe for Dr. Hulan [name].  This visit occurred during the SARS-CoV-2 public health emergency.  Safety protocols were in place, including screening questions prior to the visit, additional usage of staff PPE, and extensive cleaning of exam room while observing appropriate contact time as indicated for disinfecting solutions.    I'm seeing this patient by the request  of:  Robert Minium, MD  CC: Right lower leg pain  [id number]  11/11/2021 7 difficulties overall.  Very difficult to assess what is giving patient the most pain.  Patient still states that it is only in the changing of the position he gets in the problem.  When he goes from a laying down to sitting and sitting to standing.  Patient also notices it when he is flexed to get something off the floor and then is coming up.  0.1 or 2 seconds.  Patient states that the pain is severe.  Continuing to have the discomfort though.  Discussed with patient about soundwave therapy with the ossification noted previously in the gluteal area in the hamstring.  Patient would like to try this and we will refer to see how patient would respond.  Discussed with patient icing regimen and home exercises otherwise.  Follow-up with me again in 6 weeks.  Total time with patient today reviewing previous imaging and discussing different treatment options 32 minutes.  Update 12/23/2021 [name] is a 87 y.o. male coming in with complaint of R piriformis pain. Patient states that his pain is [name] better. Had 3 soundwave therapy tx. Sit to stand causes the most pain. Patient notes tossing and turning at night due to pain.      Past Medical History:  Diagnosis Date   Allergy    Arthritis    "scattered" (10/26/2018)   Asthma    C. difficile diarrhea    CAD (coronary artery disease) 2005   s/p PCI of  RCA   Carotid artery stenosis    1-39% bilateral by dopplers 10/2016   Carotid stenosis    "? side; Dr. Scot [name]"   Cataract    NS OD   Cognitive impairment, mild, so stated    Colitis    Erectile dysfunction    GERD (gastroesophageal reflux disease)    Hyperlipidemia    Hypertension    Perforated stomach, acute 10/23/2018   Peripheral vascular disease ([place])    Retinal detachment    Vertebrobasilar artery syndrome    Vertigo    Past Surgical History:  Procedure Laterality Date   APPENDECTOMY  1954   CATARACT EXTRACTION Left 08/08/2019   Dr. Bing [name]   COLONOSCOPY     ESOPHAGOGASTRODUODENOSCOPY (EGD) WITH ESOPHAGEAL DILATION  09/2018   EYE SURGERY     LAPAROTOMY N/A 10/23/2018   Procedure: EXPLORATORY LAPAROTOMY WITH GRAHAM PATCH FOR PERFORATED ULCER AND PLACEMENT OF GASTRIC TUBE;  Surgeon: Robert Bookbinder, MD;  Location: [facility];  Service: General;  Laterality: N/A;   NASAL SINUS SURGERY  05/2018   RETINAL DETACHMENT SURGERY     "? side"   UPPER GASTROINTESTINAL ENDOSCOPY     Social History   Socioeconomic History   Marital status: Married    Spouse name: Not on file   Number of children: 3   Years of education: Not on file   Highest education level: Not on file  Occupational History   Occupation:  Retired Landscape architect: RETIRED  Tobacco Use   Smoking status: Former    Types: Cigars    Quit date: 05/13/1961    Years since quitting: 60.6   Smokeless tobacco: Never   Tobacco comments:    "never inhaled" in the early 60's   Vaping Use   Vaping Use: Never used  Substance and Sexual Activity   Alcohol use: Yes    Alcohol/[name]: 14.0 standard drinks    Types: 14 Glasses of wine per [name]   Drug use: Never   Sexual activity: Not Currently  Other Topics Concern   Not on file  Social History Narrative   Married with 1 son and 2 daughters.  Retired.   2 caffeinated beverages daily, 3 glasses of wine daily   Never smoker [name] drug use [name] tobacco    Social Determinants of Radio broadcast assistant Strain: Low Risk    Difficulty of Paying Living Expenses: Not hard at all  Food Insecurity: [name] Food Insecurity   Worried About Charity fundraiser in the Last Year: Never true   Ran Out of Food in the Last Year: Never true  Transportation Needs: [name] Transportation Needs   Lack of Transportation (Medical): [name]   Lack of Transportation (Non-Medical): [name]  Physical Activity: Inactive   Days of Exercise per [name]: 0 days   Minutes of Exercise per Session: 0 min  Stress: [name] Stress Concern Present   Feeling of Stress : Not at all  Social Connections: Moderately Integrated   Frequency of Communication with Friends and Family: More than three times a [name]   Frequency of Social Gatherings with Friends and Family: More than three times a [name]   Attends Religious Services: Never   Marine scientist or Organizations: Yes   Attends Music therapist: More than 4 times per year   Marital Status: Married   Allergies  Allergen Reactions   Neosporin [Bacitracin-Polymyxin B] Other (See Comments)    Redness   Family History  Problem Relation Age of Onset   Stroke Mother    Hyperlipidemia Mother    Hypertension Mother    Deep vein thrombosis Mother    Stroke Father    Hyperlipidemia Father    Hypertension Father    Deep vein thrombosis Father    Diverticulitis Father    Ulcerative colitis Father    Colon polyps Father    Colon cancer Neg Hx    Esophageal cancer Neg Hx    Stomach cancer Neg Hx    Rectal cancer Neg Hx      Current Outpatient Medications (Cardiovascular):    amLODipine (NORVASC) 5 MG tablet, Take 1 tablet (5 mg total) by mouth daily. Please make yearly appt with [name] for January 2023 before anymore refills. Thank you 1st attempt   atorvastatin (LIPITOR) 80 MG tablet, Take 1 tablet (80 mg total) by mouth daily. Please make overdue appt with [name] before anymore refills. Thank you 1st attempt    carvedilol (COREG) 6.25 MG tablet, Take 1 tablet (6.25 mg total) by mouth 2 (two) times daily with a meal.   hydrochlorothiazide (HYDRODIURIL) 25 MG tablet, Take 1 tablet (25 mg total) by mouth daily.  Current Outpatient Medications (Respiratory):    albuterol (PROVENTIL) (2.5 MG/3ML) 0.083% nebulizer solution, Take 3 mLs (2.5 mg total) by nebulization every 6 (six) hours as needed for wheezing or shortness of breath.   albuterol (VENTOLIN HFA) 108 (90 Base)  MCG/ACT inhaler, INHALE 2 PUFFS INTO LUNGS EVERY 6 HOURS AS NEEDED FOR WHEEZING OR SHORTNESS OF BREATH.   azelastine (ASTELIN) 0.1 % nasal spray, Place 1 spray into both nostrils 2 (two) times daily.   fluticasone (FLONASE) 50 MCG/ACT nasal spray, 1 spray each nostril twice daily   fluticasone furoate-vilanterol (BREO ELLIPTA) 100-25 MCG/INH AEPB, Inhale 1 puff into the lungs daily.   fluticasone furoate-vilanterol (BREO ELLIPTA) 100-25 MCG/INH AEPB, INHALE 1 PUFF INTO THE LUNGS DAILY   montelukast (SINGULAIR) 10 MG tablet, TAKE 1 TABLET BY MOUTH AT BEDTIME. GENERIC EQUIVALENT FOR SINGULAIR.  Current Outpatient Medications (Analgesics):    acetaminophen (TYLENOL) 500 MG tablet, Take 500 mg by mouth 2 (two) times daily.   aspirin EC 81 MG tablet, Take 81 mg by mouth daily.   Current Outpatient Medications (Other):    cholecalciferol (VITAMIN D3) 25 MCG (1000 UT) tablet, Take 1,000 Units by mouth daily.   donepezil (ARICEPT) 10 MG tablet, Take 1 tablet (10 mg total) by mouth daily.   Eyelid Cleansers (OCUSOFT EYELID CLEANSING) PADS, Place 1 application into both eyes 2 (two) times daily.   fish oil-omega-3 fatty acids 1000 MG capsule, Take 2 capsules (2 g total) by mouth 2 (two) times daily.   gabapentin (NEURONTIN) 100 MG capsule, Take 2 capsules (200 mg total) by mouth at bedtime.   gabapentin (NEURONTIN) 300 MG capsule, TAKE 1 CAPSULE(300 MG) BY MOUTH THREE TIMES DAILY   ofloxacin (OCUFLOX) 0.3 % ophthalmic solution, INSTILL 1 DROP IN  LEFT EYE FOUR TIMES DAILY FOR 7 DAYS BEGINNING AFTER SURGERY   Polyvinyl Alcohol-Povidone (REFRESH OP), Place 1 drop into both eyes 2 (two) times daily.   Saw Palmetto 500 MG CAPS, Take 1,000 mg by mouth 2 (two) times daily.    Reviewed prior external information including notes and imaging from  primary care provider As well as notes that were available from care everywhere and other healthcare systems.  Past medical history, social, surgical and family history all reviewed in electronic medical record.  [name] pertanent information unless stated regarding to the chief complaint.   Review of Systems:  [name] headache, visual changes, nausea, vomiting, diarrhea, constipation, dizziness, abdominal pain, skin rash, fevers, chills, night sweats, weight loss, swollen lymph nodes, body aches, joint swelling, chest pain, shortness of breath, mood changes. POSITIVE muscle aches  Objective  Blood pressure 128/62, pulse 61, height 5\' 5"  (1.651 m), weight 149 lb (67.6 kg), SpO2 96 %.   General: [name] apparent distress alert and oriented x3 mood and affect normal, dressed appropriately.  HEENT: Pupils equal, extraocular movements intact  Respiratory: Patient's speak in full sentences and does not appear short of breath  Cardiovascular: [name] lower extremity edema, non tender, [name] erythema  Gait mild antalgic initially and does favor the right leg Patient does have some mild tightness noted of the hamstring on the right side.  Patient does have a positive Corky Sox on the right side as well.  Procedure: Real-time Ultrasound Guided Injection of right proximal hamstring tendon Device: GE Logiq Q7 Ultrasound guided injection is preferred based studies that show increased duration, increased effect, greater accuracy, decreased procedural pain, increased response rate, and decreased cost with ultrasound guided versus blind injection.  Verbal informed consent obtained.  Time-out conducted.  Noted [name] overlying erythema,  induration, or other signs of local infection.  Skin prepped in a sterile fashion.  Local anesthesia: Topical Ethyl chloride.  With sterile technique and under real time ultrasound guidance: With a 21 gauge 2  inch needle patient was injected at the ischial tuberosity at the insertion of the hamstring tendon with a total of 2 cc of 0.5% Marcaine and 1 cc of Kenalog 40 mg per mill. Completed without difficulty  Pain immediately improved  suggesting accurate placement of the medication.  Advised to call if fevers/chills, erythema, induration, drainage, or persistent bleeding.  Impression: Technically successful ultrasound guided injection.    Impression and Recommendations:     The above documentation has been reviewed and is accurate and complete Lyndal Pulley, DO

## 2021-12-23 ENCOUNTER — Encounter: Payer: Self-pay | Admitting: Family Medicine

## 2021-12-23 ENCOUNTER — Other Ambulatory Visit: Payer: Self-pay

## 2021-12-23 ENCOUNTER — Ambulatory Visit: Payer: Self-pay

## 2021-12-23 ENCOUNTER — Ambulatory Visit: Payer: Medicare Other | Admitting: Family Medicine

## 2021-12-23 VITALS — BP 128/62 | HR 61 | Ht 65.0 in | Wt 149.0 lb

## 2021-12-23 DIAGNOSIS — M79604 Pain in right leg: Secondary | ICD-10-CM

## 2021-12-23 DIAGNOSIS — M65261 Calcific tendinitis, right lower leg: Secondary | ICD-10-CM | POA: Diagnosis not present

## 2021-12-23 NOTE — Assessment & Plan Note (Signed)
Patient given injection and tolerated the procedure well, discussed icing regimen and home exercises, discussed which activities to do and which ones to avoid.  Hopefully this will make some improvement.  Differential also includes the piriformis as well as the lumbar pathology.  Continues to have the pain on a fairly regular basis.  Still seems to be only with certain range of motion now.  Follow-up with me again in 6 to 8 weeks

## 2021-12-23 NOTE — Patient Instructions (Addendum)
Injection in proximal hamstring today Take it easy for next 10 days Honey do list needs a pause See me in 4-8 weeks

## 2021-12-24 DIAGNOSIS — I10 Essential (primary) hypertension: Secondary | ICD-10-CM | POA: Diagnosis not present

## 2021-12-24 DIAGNOSIS — J45909 Unspecified asthma, uncomplicated: Secondary | ICD-10-CM | POA: Diagnosis not present

## 2021-12-27 ENCOUNTER — Other Ambulatory Visit: Payer: Self-pay

## 2021-12-27 DIAGNOSIS — G3184 Mild cognitive impairment, so stated: Secondary | ICD-10-CM

## 2021-12-27 MED ORDER — DONEPEZIL HCL 10 MG PO TABS: 10.0000 mg | ORAL_TABLET | Freq: Every day | ORAL | 1 refills | Status: DC

## 2022-01-02 ENCOUNTER — Other Ambulatory Visit: Payer: Self-pay | Admitting: Cardiology

## 2022-01-06 IMAGING — DX DG CHEST 2V
2 series · 2 of 2 positions shown · non-contrast
Comparison: 10/24/2018

CLINICAL DATA: Relative cough

EXAM:
CHEST - 2 VIEW

[chest pa]
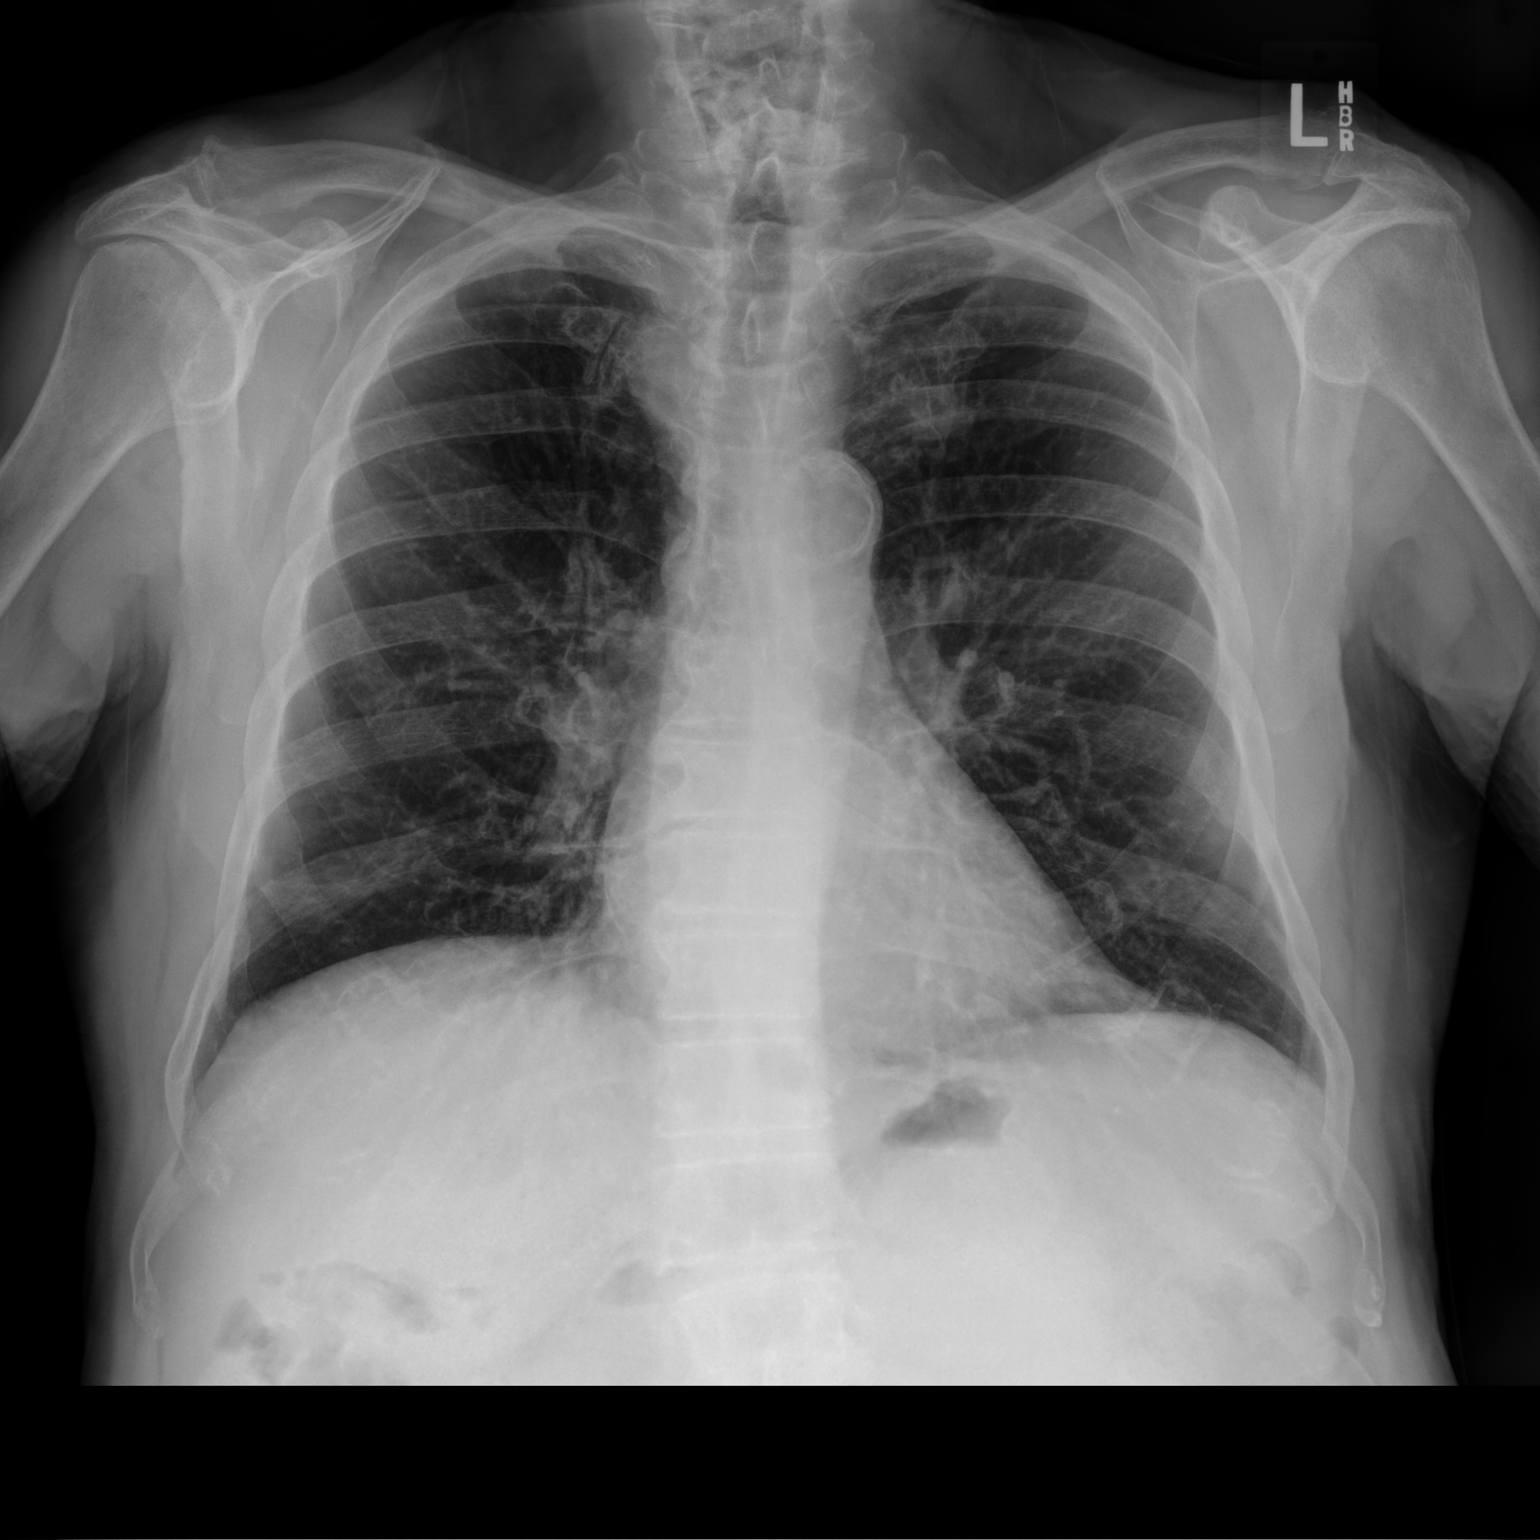

[chest lat]
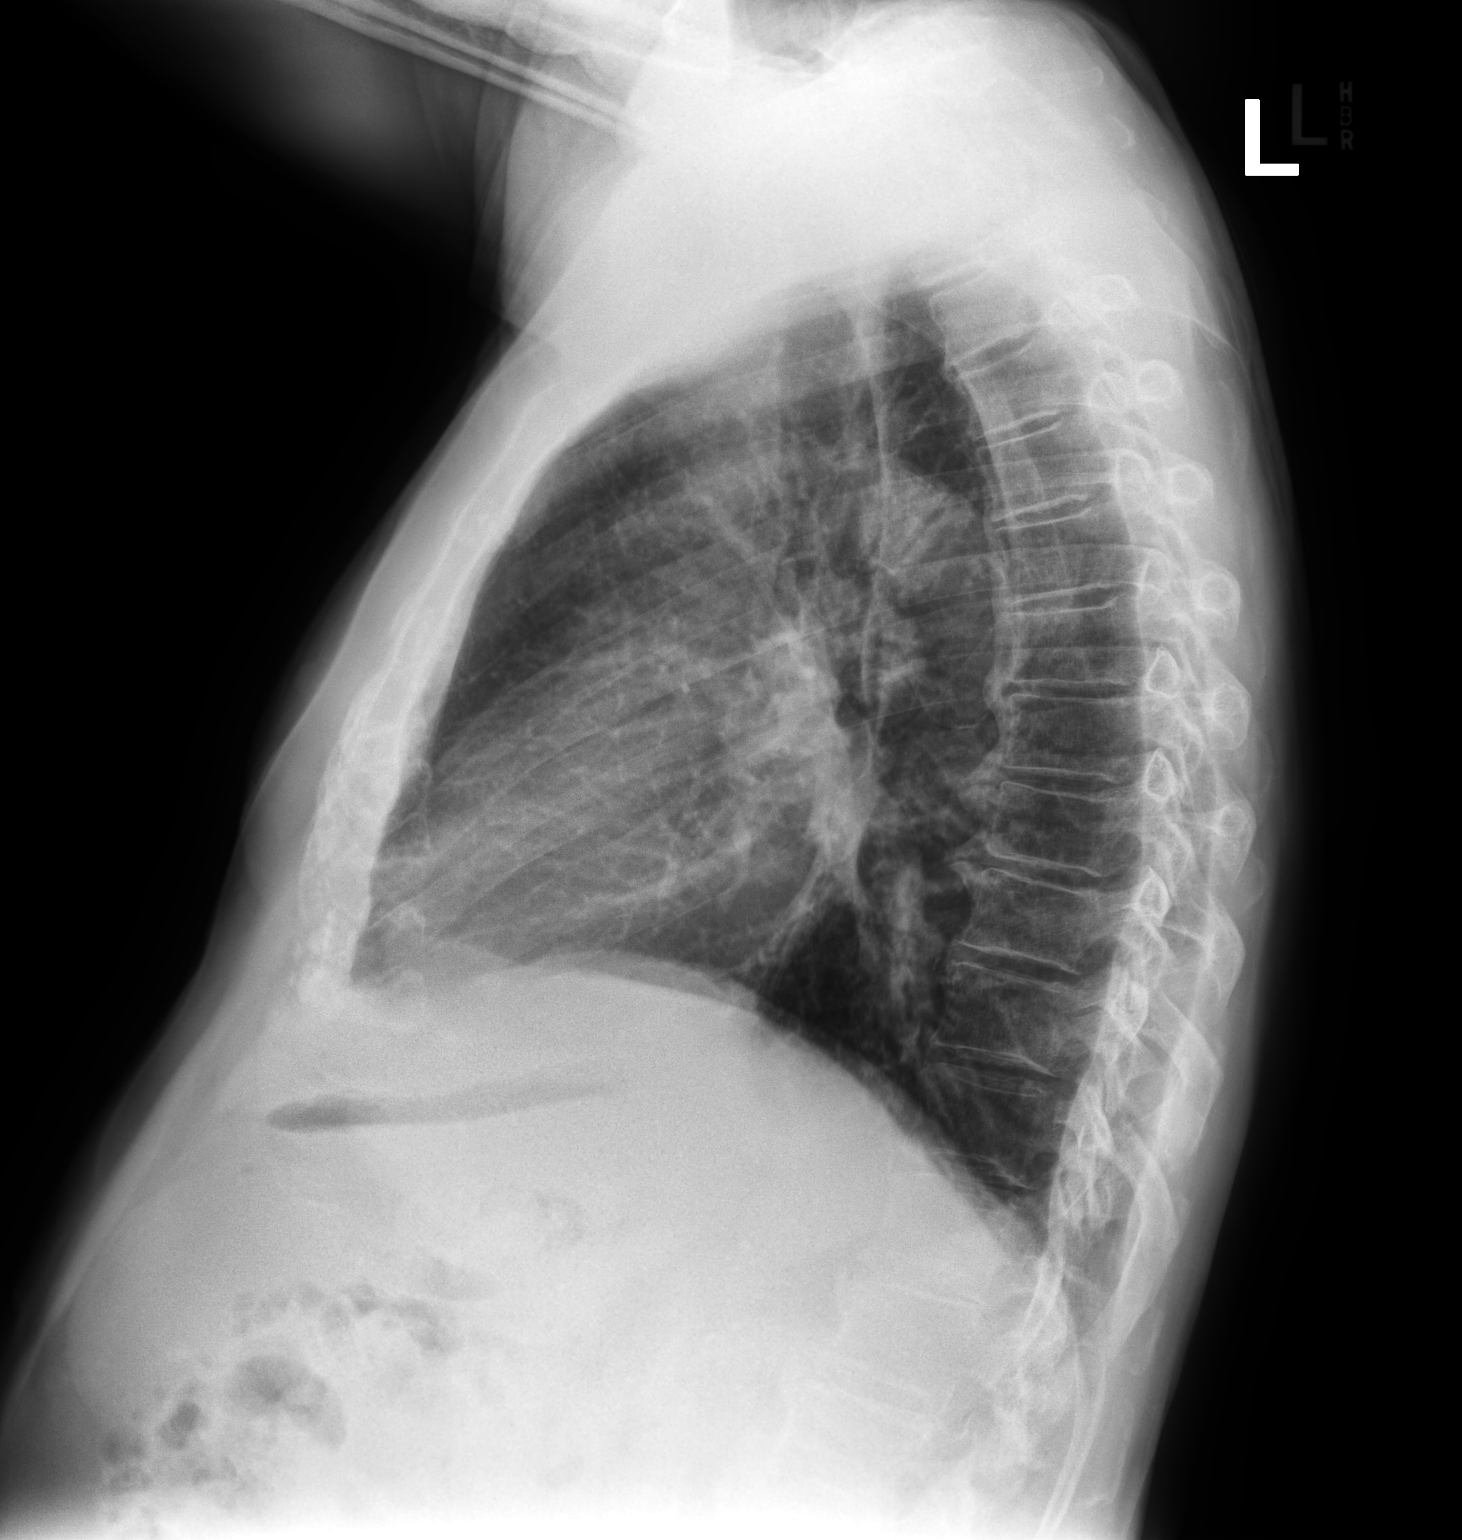

[2 of 2 positions shown; findings below may reference images not displayed]

FINDINGS: Normal mediastinum and cardiac silhouette. Chronic central
bronchitic markings. Normal pulmonary vasculature. No effusion,
infiltrate, or pneumothorax.
IMPRESSION: No acute cardiopulmonary process.

## 2022-01-30 NOTE — Progress Notes (Signed)
?[name] D.O. ?[facility] ?[address] [place] [address] ?Phone: [phone] ?Subjective:   ? ?I'm seeing this patient by the request  of:  Sheliah Hatch, MD ? ?[name], PhD, LAT, ATC acting as a Neurosurgeon for [employer], DO. ? ?CC: R leg pain ? ?[id number]  ?12/23/2021 ?Patient given injection and tolerated the procedure well, discussed icing regimen and home exercises, discussed which activities to do and which ones to avoid.  Hopefully this will make some improvement.  Differential also includes the piriformis as well as the lumbar pathology.  Continues to have the pain on a fairly regular basis.  Still seems to be only with certain range of motion now.  Follow-up with me again in 6 to 8 weeks ? ?Update 02/03/2022 ?[name] is a 87 y.o. male coming in with complaint of R leg pain. Patient states R leg pain has improved some. Pt locates pain to the posterior aspect of the R hip. No radiating pain. No numbness/tingling distally. Pt has been somewhat compliant w/ HEP. ? ? ?  ? ?Past Medical History:  ?Diagnosis Date  ? Allergy   ? Arthritis   ? "scattered" (10/26/2018)  ? Asthma   ? C. difficile diarrhea   ? CAD (coronary artery disease) 2005  ? s/p PCI of RCA  ? Carotid artery stenosis   ? 1-39% bilateral by dopplers 10/2016  ? Carotid stenosis   ? "? side; Dr. Edilia Bo"  ? Cataract   ? NS OD  ? Cognitive impairment, mild, so stated   ? Colitis   ? Erectile dysfunction   ? GERD (gastroesophageal reflux disease)   ? Hyperlipidemia   ? Hypertension   ? Perforated stomach, acute 10/23/2018  ? Peripheral vascular disease ([facility])   ? Retinal detachment   ? Vertebrobasilar artery syndrome   ? Vertigo   ? ?Past Surgical History:  ?Procedure Laterality Date  ? APPENDECTOMY  1954  ? CATARACT EXTRACTION Left 08/08/2019  ? Dr. Hazle Quant  ? COLONOSCOPY    ? ESOPHAGOGASTRODUODENOSCOPY (EGD) WITH ESOPHAGEAL DILATION  09/2018  ? EYE SURGERY    ? LAPAROTOMY N/A 10/23/2018  ? Procedure:  EXPLORATORY LAPAROTOMY WITH GRAHAM PATCH FOR PERFORATED ULCER AND PLACEMENT OF GASTRIC TUBE;  Surgeon: Emelia Loron, MD;  Location: [facility];  Service: General;  Laterality: N/A;  ? NASAL SINUS SURGERY  05/2018  ? RETINAL DETACHMENT SURGERY    ? "? side"  ? UPPER GASTROINTESTINAL ENDOSCOPY    ? ?Social History  ? ?Socioeconomic History  ? Marital status: Married  ?  Spouse name: Not on file  ? Number of children: 3  ? Years of education: Not on file  ? Highest education level: Not on file  ?Occupational History  ? 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  Employer: RETIRED  ?Tobacco Use  ? Smoking status: Former  ?  Types: Cigars  ?  Quit date: 05/13/1961  ?  Years since quitting: 60.7  ? Smokeless tobacco: Never  ? Tobacco comments:  ?  "never inhaled" in the early 60's   ?Vaping Use  ? Vaping Use: Never used  ?Substance and Sexual Activity  ? Alcohol use: Yes  ?  Alcohol/week: 14.0 standard drinks  ?  Types: 14 Glasses of wine per week  ? Drug use: Never  ? Sexual activity: Not Currently  ?Other Topics Concern  ? Not on file  ?Social History Narrative  ? Married with 1 son and 2 daughters.  Retired.  ? 2 caffeinated beverages daily, 3 glasses of wine daily  ? Never smoker no drug use no tobacco  ? ?Social Determinants of Health  ? ?Financial Resource Strain: Low Risk   ? Difficulty of Paying Living Expenses: Not hard at all  ?Food Insecurity: No Food Insecurity  ? Worried About Programme researcher, broadcasting/film/video in the Last Year: Never true  ? Ran Out of Food in the Last Year: Never true  ?Transportation Needs: No Transportation Needs  ? Lack of Transportation (Medical): No  ? Lack of Transportation (Non-Medical): No  ?Physical Activity: Inactive  ? Days of Exercise per Week: 0 days  ? Minutes of Exercise per Session: 0 min  ?Stress: No Stress Concern Present  ? Feeling of Stress : Not at all  ?Social Connections: Moderately Integrated  ? Frequency of Communication with Friends and Family: More than three times a week  ?  Frequency of Social Gatherings with Friends and Family: More than three times a week  ? Attends Religious Services: Never  ? Active Member of Clubs or Organizations: Yes  ? Attends Banker Meetings: More than 4 times per year  ? Marital Status: Married  ? ?Allergies  ?Allergen Reactions  ? Neosporin [Bacitracin-Polymyxin B] Other (See Comments)  ?  Redness  ? ?Family History  ?Problem Relation Age of Onset  ? Stroke Mother   ? Hyperlipidemia Mother   ? Hypertension Mother   ? Deep vein thrombosis Mother   ? Stroke Father   ? Hyperlipidemia Father   ? Hypertension Father   ? Deep vein thrombosis Father   ? Diverticulitis Father   ? Ulcerative colitis Father   ? Colon polyps Father   ? Colon cancer Neg Hx   ? Esophageal cancer Neg Hx   ? Stomach cancer Neg Hx   ? Rectal cancer Neg Hx   ? ? ? ?Current Outpatient Medications (Cardiovascular):  ?  amLODipine (NORVASC) 5 MG tablet, Take 1 tablet (5 mg total) by mouth daily. Please make yearly appt with Dr. Mayford Knife for January 2023 before anymore refills. Thank you 1st attempt ?  atorvastatin (LIPITOR) 80 MG tablet, Take 1 tablet (80 mg total) by mouth daily. ?  carvedilol (COREG) 6.25 MG tablet, Take 1 tablet (6.25 mg total) by mouth 2 (two) times daily with a meal. ?  hydrochlorothiazide (HYDRODIURIL) 25 MG tablet, Take 1 tablet (25 mg total) by mouth daily. ? ?Current Outpatient Medications (Respiratory):  ?  albuterol (PROVENTIL) (2.5 MG/3ML) 0.083% nebulizer solution, Take 3 mLs (2.5 mg total) by nebulization every 6 (six) hours as needed for wheezing or shortness of breath. ?  albuterol (VENTOLIN HFA) 108 (90 Base) MCG/ACT inhaler, INHALE 2 PUFFS INTO LUNGS EVERY 6 HOURS AS NEEDED FOR WHEEZING OR SHORTNESS OF BREATH. ?  azelastine (ASTELIN) 0.1 % nasal spray, Place 1 spray into both nostrils 2 (two) times daily. ?  fluticasone (FLONASE) 50 MCG/ACT nasal spray, 1 spray each nostril twice daily ?  fluticasone furoate-vilanterol (BREO ELLIPTA) 100-25 MCG/INH  AEPB, Inhale 1 puff into the lungs daily. ?  fluticasone furoate-vilanterol (BREO ELLIPTA) 100-25 MCG/INH AEPB, INHALE 1 PUFF INTO THE LUNGS DAILY ?  montelukast (SINGULAIR) 10 MG tablet, TAKE 1 TABLET BY MOUTH AT BEDTIME. GENERIC EQUIVALENT FOR SINGULAIR. ? ?Current Outpatient Medications (Analgesics):  ?  acetaminophen (TYLENOL) 500 MG tablet, Take 500 mg by mouth 2 (two) times daily. ?  aspirin EC 81 MG tablet, Take 81 mg by mouth daily. ? ? ?Current  Outpatient Medications (Other):  ?  cholecalciferol (VITAMIN D3) 25 MCG (1000 UT) tablet, Take 1,000 Units by mouth daily. ?  donepezil (ARICEPT) 10 MG tablet, Take 1 tablet (10 mg total) by mouth daily. ?  Eyelid Cleansers (OCUSOFT EYELID CLEANSING) PADS, Place 1 application into both eyes 2 (two) times daily. ?  fish oil-omega-3 fatty acids 1000 MG capsule, Take 2 capsules (2 g total) by mouth 2 (two) times daily. ?  gabapentin (NEURONTIN) 100 MG capsule, Take 2 capsules (200 mg total) by mouth at bedtime. ?  gabapentin (NEURONTIN) 300 MG capsule, TAKE 1 CAPSULE(300 MG) BY MOUTH THREE TIMES DAILY ?  ofloxacin (OCUFLOX) 0.3 % ophthalmic solution, INSTILL 1 DROP IN LEFT EYE FOUR TIMES DAILY FOR 7 DAYS BEGINNING AFTER SURGERY ?  Polyvinyl Alcohol-Povidone (REFRESH OP), Place 1 drop into both eyes 2 (two) times daily. ?  Saw Palmetto 500 MG CAPS, Take 1,000 mg by mouth 2 (two) times daily.  ? ? ? ?Objective  ?Blood pressure 122/72, pulse (!) 59, height 5\' 5"  (1.651 m), weight 145 lb (65.8 kg), SpO2 98 %. ?  ?General: No apparent distress alert and oriented x3 mood and affect normal, dressed appropriately.  ?HEENT: Pupils equal, extraocular movements intact  ?Respiratory: Patient's speak in full sentences and does not appear short of breath  ?Cardiovascular: No lower extremity edema, non tender, no erythema  ?Gait normal with good balance and coordination.  ?MSK: Still some mild tightness of the right hamstring compared to the contralateral hamstring.  Still significant  loss of lordosis of the back. ? ?  ?Impression and Recommendations:  ?  ? ?The above documentation has been reviewed and is accurate and complete Judi SaaZachary M Ranya Fiddler, DO ? ? ? ?

## 2022-02-03 ENCOUNTER — Ambulatory Visit: Payer: Medicare Other | Admitting: Family Medicine

## 2022-02-03 DIAGNOSIS — M65261 Calcific tendinitis, right lower leg: Secondary | ICD-10-CM | POA: Diagnosis not present

## 2022-02-03 NOTE — Assessment & Plan Note (Signed)
Patient did have the most improvement with this injection than anything else over the course of time.  We discussed which activities to do which ones to avoid.  Patient is to continue to be active where possible.  Hopefully patient will continue to make improvement and follow-up again in 3 months ?

## 2022-02-03 NOTE — Patient Instructions (Addendum)
Good to see you ? ?Best I've seen you ? ?Happy it worked! ? ?See me again in  2-3 months ?

## 2022-02-05 ENCOUNTER — Other Ambulatory Visit: Payer: Self-pay | Admitting: *Deleted

## 2022-02-05 DIAGNOSIS — I6523 Occlusion and stenosis of bilateral carotid arteries: Secondary | ICD-10-CM

## 2022-02-05 DIAGNOSIS — I10 Essential (primary) hypertension: Secondary | ICD-10-CM

## 2022-02-05 DIAGNOSIS — I2510 Atherosclerotic heart disease of native coronary artery without angina pectoris: Secondary | ICD-10-CM

## 2022-02-05 DIAGNOSIS — E7800 Pure hypercholesterolemia, unspecified: Secondary | ICD-10-CM

## 2022-02-05 MED ORDER — AMLODIPINE BESYLATE 5 MG PO TABS: 5.0000 mg | ORAL_TABLET | Freq: Every day | ORAL | 3 refills | Status: DC

## 2022-02-10 ENCOUNTER — Ambulatory Visit (HOSPITAL_COMMUNITY)
Admission: RE | Admit: 2022-02-10 | Discharge: 2022-02-10 | Disposition: A | Discharge status: Home / Self Care | Payer: Medicare Other | Source: Ambulatory Visit | Attending: Cardiology | Admitting: Cardiology

## 2022-02-10 DIAGNOSIS — I6523 Occlusion and stenosis of bilateral carotid arteries: Secondary | ICD-10-CM

## 2022-02-11 ENCOUNTER — Telehealth: Payer: Self-pay

## 2022-02-11 ENCOUNTER — Encounter: Payer: Self-pay | Admitting: Cardiology

## 2022-02-11 DIAGNOSIS — I6523 Occlusion and stenosis of bilateral carotid arteries: Secondary | ICD-10-CM

## 2022-02-11 NOTE — Telephone Encounter (Signed)
The patient has been notified of the result and verbalized understanding.  All questions (if any) were answered. ?Theresia Majors, RN 02/11/2022 1:23 PM  ? ?

## 2022-02-11 NOTE — Telephone Encounter (Signed)
-----   Message from Quintella Reichert, MD sent at 02/11/2022  8:13 AM EDT ----- ?Carotid Doppler showed 1 to 39% right ICA stenosis and 40 to 59% left ICA stenosis.  Repeat study in 1 year ?

## 2022-02-12 ENCOUNTER — Ambulatory Visit: Payer: Medicare Other | Admitting: Physician Assistant

## 2022-02-12 VITALS — BP 122/65 | HR 57 | Temp 98.1°F | Resp 20 | Ht 65.0 in | Wt 144.0 lb

## 2022-02-12 DIAGNOSIS — I6523 Occlusion and stenosis of bilateral carotid arteries: Secondary | ICD-10-CM

## 2022-02-12 NOTE — Progress Notes (Signed)
?Office Note  ? ? ? ?CC:  follow up ?Requesting Provider:  Sheliah Hatch, MD ? ?HPI: [name] is a 87 y.o. (12-12-1934) male who presents for surveillance of carotid artery stenosis.  Since last office visit he denies any diagnosis of CVA or TIA.  He also denies any current strokelike symptoms including slurring speech, changes in vision, or one-sided weakness.  He also denies any claudication history.  He is on an aspirin and a statin daily.  He is a former smoker.  He is a retired Academic librarian. ? ? ? ?Past Medical History:  ?Diagnosis Date  ? Allergy   ? Arthritis   ? "scattered" (10/26/2018)  ? Asthma   ? C. difficile diarrhea   ? CAD (coronary artery disease) 2005  ? s/p PCI of RCA  ? Carotid artery stenosis   ? 1 to 39% right ICA and 40 to 59% left ICA stenosis by Dopplers 01/2022  ? Carotid stenosis   ? "? side; Dr. Edilia Bo"  ? Cataract   ? NS OD  ? Cognitive impairment, mild, so stated   ? Colitis   ? Erectile dysfunction   ? GERD (gastroesophageal reflux disease)   ? Hyperlipidemia   ? Hypertension   ? Perforated stomach, acute 10/23/2018  ? Peripheral vascular disease (HCC)   ? Retinal detachment   ? Vertebrobasilar artery syndrome   ? Vertigo   ? ? ?Past Surgical History:  ?Procedure Laterality Date  ? APPENDECTOMY  1954  ? CATARACT EXTRACTION Left 08/08/2019  ? Dr. Hazle Quant  ? COLONOSCOPY    ? ESOPHAGOGASTRODUODENOSCOPY (EGD) WITH ESOPHAGEAL DILATION  09/2018  ? EYE SURGERY    ? LAPAROTOMY N/A 10/23/2018  ? Procedure: EXPLORATORY LAPAROTOMY WITH GRAHAM PATCH FOR PERFORATED ULCER AND PLACEMENT OF GASTRIC TUBE;  Surgeon: Emelia Loron, MD;  Location: [facility] OR;  Service: General;  Laterality: N/A;  ? NASAL SINUS SURGERY  05/2018  ? RETINAL DETACHMENT SURGERY    ? "? side"  ? UPPER GASTROINTESTINAL ENDOSCOPY    ? ? ?Social History  ? ?Socioeconomic History  ? Marital status: Married  ?  Spouse name: Not on file  ? Number of children: 3  ? Years of education: Not on file  ? Highest education level:  Not on file  ?Occupational History  ? 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  Employer: RETIRED  ?Tobacco Use  ? Smoking status: Former  ?  Types: Cigars  ?  Quit date: 05/13/1961  ?  Years since quitting: 60.7  ?  Passive exposure: Never  ? Smokeless tobacco: Never  ? Tobacco comments:  ?  "never inhaled" in the early 60's   ?Vaping Use  ? Vaping Use: Never used  ?Substance and Sexual Activity  ? Alcohol use: Yes  ?  Alcohol/week: 14.0 standard drinks  ?  Types: 14 Glasses of wine per week  ? Drug use: Never  ? Sexual activity: Not Currently  ?Other Topics Concern  ? Not on file  ?Social History Narrative  ? Married with 1 son and 2 daughters.  Retired.  ? 2 caffeinated beverages daily, 3 glasses of wine daily  ? Never smoker no drug use no tobacco  ? ?Social Determinants of Health  ? ?Financial Resource Strain: Low Risk   ? Difficulty of Paying Living Expenses: Not hard at all  ?Food Insecurity: No Food Insecurity  ? Worried About Programme researcher, broadcasting/film/video in the Last Year: Never true  ? Ran Out of  Food in the Last Year: Never true  ?Transportation Needs: No Transportation Needs  ? Lack of Transportation (Medical): No  ? Lack of Transportation (Non-Medical): No  ?Physical Activity: Inactive  ? Days of Exercise per Week: 0 days  ? Minutes of Exercise per Session: 0 min  ?Stress: No Stress Concern Present  ? Feeling of Stress : Not at all  ?Social Connections: Moderately Integrated  ? Frequency of Communication with Friends and Family: More than three times a week  ? Frequency of Social Gatherings with Friends and Family: More than three times a week  ? Attends Religious Services: Never  ? Active Member of Clubs or Organizations: Yes  ? Attends Banker Meetings: More than 4 times per year  ? Marital Status: Married  ?Intimate Partner Violence: Not At Risk  ? Fear of Current or Ex-Partner: No  ? Emotionally Abused: No  ? Physically Abused: No  ? Sexually Abused: No  ? ? ?Family History  ?Problem Relation  Age of Onset  ? Stroke Mother   ? Hyperlipidemia Mother   ? Hypertension Mother   ? Deep vein thrombosis Mother   ? Stroke Father   ? Hyperlipidemia Father   ? Hypertension Father   ? Deep vein thrombosis Father   ? Diverticulitis Father   ? Ulcerative colitis Father   ? Colon polyps Father   ? Colon cancer Neg Hx   ? Esophageal cancer Neg Hx   ? Stomach cancer Neg Hx   ? Rectal cancer Neg Hx   ? ? ?Current Outpatient Medications  ?Medication Sig Dispense Refill  ? acetaminophen (TYLENOL) 500 MG tablet Take 500 mg by mouth 2 (two) times daily.    ? albuterol (PROVENTIL) (2.5 MG/3ML) 0.083% nebulizer solution Take 3 mLs (2.5 mg total) by nebulization every 6 (six) hours as needed for wheezing or shortness of breath. 90 vial 3  ? albuterol (VENTOLIN HFA) 108 (90 Base) MCG/ACT inhaler INHALE 2 PUFFS INTO LUNGS EVERY 6 HOURS AS NEEDED FOR WHEEZING OR SHORTNESS OF BREATH. 8.5 g 3  ? amLODipine (NORVASC) 5 MG tablet Take 1 tablet (5 mg total) by mouth daily. 90 tablet 3  ? aspirin EC 81 MG tablet Take 81 mg by mouth daily.    ? atorvastatin (LIPITOR) 80 MG tablet Take 1 tablet (80 mg total) by mouth daily. 90 tablet 3  ? azelastine (ASTELIN) 0.1 % nasal spray Place 1 spray into both nostrils 2 (two) times daily. 30 mL 5  ? carvedilol (COREG) 6.25 MG tablet Take 1 tablet (6.25 mg total) by mouth 2 (two) times daily with a meal. 180 tablet 3  ? cholecalciferol (VITAMIN D3) 25 MCG (1000 UT) tablet Take 1,000 Units by mouth daily.    ? donepezil (ARICEPT) 10 MG tablet Take 1 tablet (10 mg total) by mouth daily. 90 tablet 1  ? Eyelid Cleansers (OCUSOFT EYELID CLEANSING) PADS Place 1 application into both eyes 2 (two) times daily.    ? fish oil-omega-3 fatty acids 1000 MG capsule Take 2 capsules (2 g total) by mouth 2 (two) times daily. 360 capsule 3  ? fluticasone (FLONASE) 50 MCG/ACT nasal spray 1 spray each nostril twice daily 16 g 11  ? fluticasone furoate-vilanterol (BREO ELLIPTA) 100-25 MCG/INH AEPB Inhale 1 puff into the  lungs daily. 28 each 0  ? fluticasone furoate-vilanterol (BREO ELLIPTA) 100-25 MCG/INH AEPB INHALE 1 PUFF INTO THE LUNGS DAILY 180 each 3  ? gabapentin (NEURONTIN) 100 MG capsule Take 2 capsules (  200 mg total) by mouth at bedtime. 180 capsule 1  ? gabapentin (NEURONTIN) 300 MG capsule TAKE 1 CAPSULE(300 MG) BY MOUTH THREE TIMES DAILY 90 capsule 3  ? hydrochlorothiazide (HYDRODIURIL) 25 MG tablet Take 1 tablet (25 mg total) by mouth daily. 90 tablet 3  ? montelukast (SINGULAIR) 10 MG tablet TAKE 1 TABLET BY MOUTH AT BEDTIME. GENERIC EQUIVALENT FOR SINGULAIR. 90 tablet 3  ? ofloxacin (OCUFLOX) 0.3 % ophthalmic solution INSTILL 1 DROP IN LEFT EYE FOUR TIMES DAILY FOR 7 DAYS BEGINNING AFTER SURGERY    ? Polyvinyl Alcohol-Povidone (REFRESH OP) Place 1 drop into both eyes 2 (two) times daily.    ? Saw Palmetto 500 MG CAPS Take 1,000 mg by mouth 2 (two) times daily.     ? ?No current facility-administered medications for this visit.  ? ? ?Allergies  ?Allergen Reactions  ? Neosporin [Bacitracin-Polymyxin B] Other (See Comments)  ?  Redness  ? ? ? ?REVIEW OF SYSTEMS:  ? ?  denotes positive finding,  denotes negative finding ?Cardiac  Comments:  ?Chest pain or chest pressure:    ?Shortness of breath upon exertion:    ?Short of breath when lying flat:    ?Irregular heart rhythm:    ?    ?Vascular    ?Pain in calf, thigh, or hip brought on by ambulation:    ?Pain in feet at night that wakes you up from your sleep:     ?Blood clot in your veins:    ?Leg swelling:     ?    ?Pulmonary    ?Oxygen at home:    ?Productive cough:     ?Wheezing:     ?    ?Neurologic    ?Sudden weakness in arms or legs:     ?Sudden numbness in arms or legs:     ?Sudden onset of difficulty speaking or slurred speech:    ?Temporary loss of vision in one eye:     ?Problems with dizziness:     ?    ?Gastrointestinal    ?Blood in stool:     ?Vomited blood:     ?    ?Genitourinary    ?Burning when urinating:     ?Blood in urine:    ?    ?Psychiatric     ?Major depression:     ?    ?Hematologic    ?Bleeding problems:    ?Problems with blood clotting too easily:    ?    ?Skin    ?Rashes or ulcers:    ?    ?Constitutional    ?Fever or chills:    ? ? ?P

## 2022-02-21 ENCOUNTER — Other Ambulatory Visit: Payer: Self-pay

## 2022-02-21 MED ORDER — CARVEDILOL 6.25 MG PO TABS: 6.2500 mg | ORAL_TABLET | Freq: Two times a day (BID) | ORAL | 3 refills | Status: DC

## 2022-03-03 ENCOUNTER — Telehealth: Payer: Self-pay | Admitting: Pharmacist

## 2022-03-03 NOTE — Progress Notes (Signed)
? ? ?Chronic Care Management ?Pharmacy Assistant  ? ?Name: [name]  MRN: [id number] DOB: 1934/12/06 ? ? ?Reason for Encounter: Disease State - Hypertension Call  ?  ? ? ?Recent office visits:  ?None noted.  ? ?Recent consult visits:  ?02/12/22 Emilie Rutter PA-C - Vascular Surgery - Bilateral extracranial CAD - No medication changes. Follow up and repeat Carotid duplex in 1 year.  ? ?02/03/22 Antoine Primas DO - Sports Medicine - Calcific tendonitis of right lower leg - No changes. Follow up in 3 months.  ? ?12/23/21  Antoine Primas DO - Sports Medicine - Calcific tendonitis of right lower leg - [place] limited joint space lower right ordered. Hamstring injection in joint in office without complication. Follow up in 4/8 weeks.  ? ?Hospital visits:  ?None in previous 6 months ? ?Medications: ?Outpatient Encounter Medications as of 03/03/2022  ?Medication Sig  ? acetaminophen (TYLENOL) 500 MG tablet Take 500 mg by mouth 2 (two) times daily.  ? albuterol (PROVENTIL) (2.5 MG/3ML) 0.083% nebulizer solution Take 3 mLs (2.5 mg total) by nebulization every 6 (six) hours as needed for wheezing or shortness of breath.  ? albuterol (VENTOLIN HFA) 108 (90 Base) MCG/ACT inhaler INHALE 2 PUFFS INTO LUNGS EVERY 6 HOURS AS NEEDED FOR WHEEZING OR SHORTNESS OF BREATH.  ? amLODipine (NORVASC) 5 MG tablet Take 1 tablet (5 mg total) by mouth daily.  ? aspirin EC 81 MG tablet Take 81 mg by mouth daily.  ? atorvastatin (LIPITOR) 80 MG tablet Take 1 tablet (80 mg total) by mouth daily.  ? azelastine (ASTELIN) 0.1 % nasal spray Place 1 spray into both nostrils 2 (two) times daily.  ? carvedilol (COREG) 6.25 MG tablet Take 1 tablet (6.25 mg total) by mouth 2 (two) times daily with a meal.  ? cholecalciferol (VITAMIN D3) 25 MCG (1000 UT) tablet Take 1,000 Units by mouth daily.  ? donepezil (ARICEPT) 10 MG tablet Take 1 tablet (10 mg total) by mouth daily.  ? Eyelid Cleansers (OCUSOFT EYELID CLEANSING) PADS Place 1 application into both eyes 2 (two)  times daily.  ? fish oil-omega-3 fatty acids 1000 MG capsule Take 2 capsules (2 g total) by mouth 2 (two) times daily.  ? fluticasone (FLONASE) 50 MCG/ACT nasal spray 1 spray each nostril twice daily  ? fluticasone furoate-vilanterol (BREO ELLIPTA) 100-25 MCG/INH AEPB Inhale 1 puff into the lungs daily.  ? fluticasone furoate-vilanterol (BREO ELLIPTA) 100-25 MCG/INH AEPB INHALE 1 PUFF INTO THE LUNGS DAILY  ? gabapentin (NEURONTIN) 100 MG capsule Take 2 capsules (200 mg total) by mouth at bedtime.  ? gabapentin (NEURONTIN) 300 MG capsule TAKE 1 CAPSULE(300 MG) BY MOUTH THREE TIMES DAILY  ? hydrochlorothiazide (HYDRODIURIL) 25 MG tablet Take 1 tablet (25 mg total) by mouth daily.  ? montelukast (SINGULAIR) 10 MG tablet TAKE 1 TABLET BY MOUTH AT BEDTIME. GENERIC EQUIVALENT FOR SINGULAIR.  ? ofloxacin (OCUFLOX) 0.3 % ophthalmic solution INSTILL 1 DROP IN LEFT EYE FOUR TIMES DAILY FOR 7 DAYS BEGINNING AFTER SURGERY  ? Polyvinyl Alcohol-Povidone (REFRESH OP) Place 1 drop into both eyes 2 (two) times daily.  ? Saw Palmetto 500 MG CAPS Take 1,000 mg by mouth 2 (two) times daily.   ? ?No facility-administered encounter medications on file as of 03/03/2022.  ? ? ?Current antihypertensive regimen:  ?HCTZ 25 mg once daily  ?Carvedilol 6.25 mg twice daily  ?Amlodipine 5 mg once daily ? ?How often are you checking your Blood Pressure?  ?Patient reported he is checking his blood  pressure once a week. ? ? ?Current home BP readings: he did not have any to report at this time.  ? ? ?What recent interventions/DTPs have been made by any provider to improve Blood Pressure control since last CPP Visit:  ?Patient reported no changes in his current medications.  ? ? ?Any recent hospitalizations or ED visits since last visit with CPP?  ?Patient has not had any hospitalizations or ED visits since last visit with CPP. ? ? ?What diet changes have been made to improve Blood Pressure Control?  ?Patient reported he does not ever add salt to his  foods.  ? ? ?What exercise is being done to improve your Blood Pressure Control?  ?Patient reported he walks and goes up his stairs in his house which is 2 stories (14 stairs) atleast 20 times a day.  ? ? ? ?Adherence Review: ?Is the patient currently on ACE/ARB medication? No ?Does the patient have >5 day gap between last estimated fill dates? No ? ? ?Care Gaps ? ?AWV: done 03/11/21 ?Colonoscopy: done 11/20/06 ?DM Eye Exam: N/A ?DM Foot Exam: N/A ?Microalbumin: N/A ?HbgAIC: N/A ?DEXA: N/A ?Mammogram: N/A ? ? ?Star Rating Drugs: ?Atorvastatin 80mg  tablet - last filled 01/02/22 90 days  ? ? ?Future Appointments  ?Date Time Provider Department Center  ?04/07/2022  1:45 PM Icard, 06/07/2022 L, DO [facility] None  ?04/14/2022 10:30 AM 04/16/2022, DO [facility] None  ?05/12/2022 10:00 AM 05/14/2022, MD [facility]  ?06/11/2022  3:45 PM [facility] PHARMACIST [facility]  ? ? ? ?Liza Showfety, CCMA ?Clinical Pharmacist Assistant  ?[phone] ? ? ?

## 2022-04-04 ENCOUNTER — Telehealth: Payer: Self-pay | Admitting: Family Medicine

## 2022-04-04 NOTE — Telephone Encounter (Signed)
Patient called asking for a refill on gabapentin (NEURONTIN) 300 MG capsule to be sent to [facility] on [address].

## 2022-04-07 ENCOUNTER — Encounter: Payer: Self-pay | Admitting: Pulmonary Disease

## 2022-04-07 ENCOUNTER — Ambulatory Visit: Payer: Medicare Other | Admitting: Pulmonary Disease

## 2022-04-07 VITALS — BP 142/56 | HR 61 | Temp 98.1°F | Ht 66.0 in | Wt 145.0 lb

## 2022-04-07 DIAGNOSIS — J45909 Unspecified asthma, uncomplicated: Secondary | ICD-10-CM | POA: Diagnosis not present

## 2022-04-07 MED ORDER — FLUTICASONE FUROATE-VILANTEROL 100-25 MCG/ACT IN AEPB: 1.0000 | INHALATION_SPRAY | Freq: Every day | RESPIRATORY_TRACT | 0 refills | Status: DC

## 2022-04-07 MED ORDER — GABAPENTIN 300 MG PO CAPS: ORAL_CAPSULE | ORAL | 3 refills | Status: DC

## 2022-04-07 NOTE — Patient Instructions (Signed)
Thank you for visiting Dr. Tonia Brooms at [facility]. Today we recommend the following:  Breo samples today  GSK application   Return in about 1 year (around 04/08/2023), or if symptoms worsen or fail to improve.    Please do your part to reduce the spread of COVID-19.

## 2022-04-07 NOTE — Progress Notes (Signed)
Synopsis: Referred in July 2020 for follow-up from pneumonia by Sheliah Hatch, MD  Subjective:   PATIENT ID: [name] GENDER: male DOB: [birth year], MRN: [id number]  Chief Complaint  Patient presents with   Follow-up    Follow up. Patient has no complaints.     This is an 87 year old gentleman seen in clinic today to establish care with pulmonary after a follow-up for recent pneumonia.  Patient was seen initially by Buelah Manis, NP in January 2020 for evaluation of cough.  In March 2020 patient was seen for possible pneumonia.  He had an HRCT which showed clustered centrilobular and tree-in-bud nodular opacities within the right lung at the time considered relation to aspiration.  He had sputum cultures with normal growth and AFB was negative.  Treated with Brio inhaler.  He is a former smoker, smoked in [employer] cigars less than 2 years.  Has a past medical history of chronic cough, coronary artery disease hypertension.  In 2019 had a perforated ulcer.  Patient was initially seen and evaluated by Dr. Marchelle Gearing.  He was seen as a pulmonary consultation in December 2019.  This was during his hospitalization for a perforated gastric ulcer status post Cheree Ditto patch.  Non smoker, [employer], was in [employer] at [employer], masters in [employer] then went to [employer]. Joined [employer] afterwards. 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 It was used for planning purposes. 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 He worked for [employer] 35 years and retired in 2000.  Of note the patient is from [facility] [place] [facility] and grew up on [place].  Patient has been seen in the past by ear nose and throat for chronic nasal congestion.  He has a history of polyps that have been removed in the past.  He is currently on a regular nasal spray.  He still feels like his congestion in his nose makes his breathing worse at  times.  OV 04/07/2022: Here today for follow-up regarding management of mild intermittent asthma.  Doing really well.  Has not been seen by me since 2020.  From respiratory standpoint doing well.  He intermittently uses his Virgel Bouquet because he is worried about falling into the donut hole too soon in October.  This increases cost of medication significantly.  So he sparingly uses it.  But he does breathe better when he is able to get the medication and use it daily.    Past Medical History:  Diagnosis Date   Allergy    Arthritis    "scattered" (10/26/2018)   Asthma    C. difficile diarrhea    CAD (coronary artery disease) 2005   s/p PCI of RCA   Carotid artery stenosis    1 to 39% right ICA and 40 to 59% left ICA stenosis by Dopplers 01/2022   Carotid stenosis    "? side; Dr. Edilia Bo"   Cataract    NS OD   Cognitive impairment, mild, so stated    Colitis    Erectile dysfunction    GERD (gastroesophageal reflux disease)    Hyperlipidemia    Hypertension    Perforated stomach, acute 10/23/2018   Peripheral vascular disease (HCC)    Retinal detachment    Vertebrobasilar artery syndrome    Vertigo      Family History  Problem Relation Age of Onset   Stroke Mother  Hyperlipidemia Mother    Hypertension Mother    Deep vein thrombosis Mother    Stroke Father    Hyperlipidemia Father    Hypertension Father    Deep vein thrombosis Father    Diverticulitis Father    Ulcerative colitis Father    Colon polyps Father    Colon cancer Neg Hx    Esophageal cancer Neg Hx    Stomach cancer Neg Hx    Rectal cancer Neg Hx      Past Surgical History:  Procedure Laterality Date   APPENDECTOMY  1954   CATARACT EXTRACTION Left 08/08/2019   Dr. Hazle Quant   COLONOSCOPY     ESOPHAGOGASTRODUODENOSCOPY (EGD) WITH ESOPHAGEAL DILATION  09/2018   EYE SURGERY     LAPAROTOMY N/A 10/23/2018   Procedure: EXPLORATORY LAPAROTOMY WITH GRAHAM PATCH FOR PERFORATED ULCER AND PLACEMENT OF GASTRIC TUBE;   Surgeon: Emelia Loron, MD;  Location: [facility] OR;  Service: General;  Laterality: N/A;   NASAL SINUS SURGERY  05/2018   RETINAL DETACHMENT SURGERY     "? side"   UPPER GASTROINTESTINAL ENDOSCOPY      Social History   Socioeconomic History   Marital status: Married    Spouse name: Not on file   Number of children: 3   Years of education: Not on file   Highest education level: Not on file  Occupational History   Occupation: Retired Teaching laboratory technician: RETIRED  Tobacco Use   Smoking status: Former    Types: Cigars    Quit date: 05/13/1961    Years since quitting: 60.9    Passive exposure: Never   Smokeless tobacco: Never   Tobacco comments:    "never inhaled" in the early 60's   Vaping Use   Vaping Use: Never used  Substance and Sexual Activity   Alcohol use: Yes    Alcohol/week: 14.0 standard drinks of alcohol    Types: 14 Glasses of wine per week   Drug use: Never   Sexual activity: Not Currently  Other Topics Concern   Not on file  Social History Narrative   Married with 1 son and 2 daughters.  Retired.   2 caffeinated beverages daily, 3 glasses of wine daily   Never smoker no drug use no tobacco   Social Determinants of Health   Financial Resource Strain: Low Risk  (03/11/2021)   Overall Financial Resource Strain (CARDIA)    Difficulty of Paying Living Expenses: Not hard at all  Food Insecurity: No Food Insecurity (03/11/2021)   Hunger Vital Sign    Worried About Running Out of Food in the Last Year: Never true    Ran Out of Food in the Last Year: Never true  Transportation Needs: No Transportation Needs (03/11/2021)   PRAPARE - Administrator, Civil Service (Medical): No    Lack of Transportation (Non-Medical): No  Physical Activity: Inactive (03/11/2021)   Exercise Vital Sign    Days of Exercise per Week: 0 days    Minutes of Exercise per Session: 0 min  Stress: No Stress Concern Present (03/11/2021)   [employer] of  Occupational Health - Occupational Stress Questionnaire    Feeling of Stress : Not at all  Social Connections: Moderately Integrated (03/11/2021)   Social Connection and Isolation Panel [NHANES]    Frequency of Communication with Friends and Family: More than three times a week    Frequency of Social Gatherings with Friends and Family: More than three times a  week    Attends Religious Services: Never    Active Member of Clubs or Organizations: Yes    Attends Banker Meetings: More than 4 times per year    Marital Status: Married  Catering manager Violence: Not At Risk (03/11/2021)   Humiliation, Afraid, Rape, and Kick questionnaire    Fear of Current or Ex-Partner: No    Emotionally Abused: No    Physically Abused: No    Sexually Abused: No     Allergies  Allergen Reactions   Neosporin [Bacitracin-Polymyxin B] Other (See Comments)    Redness     Outpatient Medications Prior to Visit  Medication Sig Dispense Refill   acetaminophen (TYLENOL) 500 MG tablet Take 500 mg by mouth 2 (two) times daily.     albuterol (PROVENTIL) (2.5 MG/3ML) 0.083% nebulizer solution Take 3 mLs (2.5 mg total) by nebulization every 6 (six) hours as needed for wheezing or shortness of breath. 90 vial 3   albuterol (VENTOLIN HFA) 108 (90 Base) MCG/ACT inhaler INHALE 2 PUFFS INTO LUNGS EVERY 6 HOURS AS NEEDED FOR WHEEZING OR SHORTNESS OF BREATH. 8.5 g 3   amLODipine (NORVASC) 5 MG tablet Take 1 tablet (5 mg total) by mouth daily. 90 tablet 3   aspirin EC 81 MG tablet Take 81 mg by mouth daily.     atorvastatin (LIPITOR) 80 MG tablet Take 1 tablet (80 mg total) by mouth daily. 90 tablet 3   azelastine (ASTELIN) 0.1 % nasal spray Place 1 spray into both nostrils 2 (two) times daily. 30 mL 5   carvedilol (COREG) 6.25 MG tablet Take 1 tablet (6.25 mg total) by mouth 2 (two) times daily with a meal. 180 tablet 3   cholecalciferol (VITAMIN D3) 25 MCG (1000 UT) tablet Take 1,000 Units by mouth daily.      donepezil (ARICEPT) 10 MG tablet Take 1 tablet (10 mg total) by mouth daily. 90 tablet 1   Eyelid Cleansers (OCUSOFT EYELID CLEANSING) PADS Place 1 application into both eyes 2 (two) times daily.     fish oil-omega-3 fatty acids 1000 MG capsule Take 2 capsules (2 g total) by mouth 2 (two) times daily. 360 capsule 3   fluticasone (FLONASE) 50 MCG/ACT nasal spray 1 spray each nostril twice daily 16 g 11   fluticasone furoate-vilanterol (BREO ELLIPTA) 100-25 MCG/INH AEPB Inhale 1 puff into the lungs daily. 28 each 0   fluticasone furoate-vilanterol (BREO ELLIPTA) 100-25 MCG/INH AEPB INHALE 1 PUFF INTO THE LUNGS DAILY 180 each 3   gabapentin (NEURONTIN) 100 MG capsule Take 2 capsules (200 mg total) by mouth at bedtime. 180 capsule 1   gabapentin (NEURONTIN) 300 MG capsule TAKE 1 CAPSULE(300 MG) BY MOUTH THREE TIMES DAILY 90 capsule 3   hydrochlorothiazide (HYDRODIURIL) 25 MG tablet Take 1 tablet (25 mg total) by mouth daily. 90 tablet 3   montelukast (SINGULAIR) 10 MG tablet TAKE 1 TABLET BY MOUTH AT BEDTIME. GENERIC EQUIVALENT FOR SINGULAIR. 90 tablet 3   ofloxacin (OCUFLOX) 0.3 % ophthalmic solution INSTILL 1 DROP IN LEFT EYE FOUR TIMES DAILY FOR 7 DAYS BEGINNING AFTER SURGERY     Polyvinyl Alcohol-Povidone (REFRESH OP) Place 1 drop into both eyes 2 (two) times daily.     Saw Palmetto 500 MG CAPS Take 1,000 mg by mouth 2 (two) times daily.      No facility-administered medications prior to visit.    Review of Systems  Constitutional:  Negative for chills, fever, malaise/fatigue and weight loss.  HENT:  Negative  for hearing loss, sore throat and tinnitus.   Eyes:  Negative for blurred vision and double vision.  Respiratory:  Positive for cough and shortness of breath. Negative for hemoptysis, sputum production, wheezing and stridor.   Cardiovascular:  Negative for chest pain, palpitations, orthopnea, leg swelling and PND.  Gastrointestinal:  Negative for abdominal pain, constipation, diarrhea,  heartburn, nausea and vomiting.  Genitourinary:  Negative for dysuria, hematuria and urgency.  Musculoskeletal:  Negative for joint pain and myalgias.  Skin:  Negative for itching and rash.  Neurological:  Negative for dizziness, tingling, weakness and headaches.  Endo/Heme/Allergies:  Negative for environmental allergies. Does not bruise/bleed easily.  Psychiatric/Behavioral:  Negative for depression. The patient is not nervous/anxious and does not have insomnia.   All other systems reviewed and are negative.    Objective:  Physical Exam Vitals reviewed.  Constitutional:      General: He is not in acute distress.    Appearance: He is well-developed.  HENT:     Head: Normocephalic and atraumatic.  Eyes:     General: No scleral icterus.    Conjunctiva/sclera: Conjunctivae normal.     Pupils: Pupils are equal, round, and reactive to light.  Neck:     Vascular: No JVD.     Trachea: No tracheal deviation.  Cardiovascular:     Rate and Rhythm: Normal rate and regular rhythm.     Heart sounds: Normal heart sounds. No murmur heard. Pulmonary:     Effort: Pulmonary effort is normal. No tachypnea, accessory muscle usage or respiratory distress.     Breath sounds: No stridor. No wheezing, rhonchi or rales.  Abdominal:     General: There is no distension.     Palpations: Abdomen is soft.     Tenderness: There is no abdominal tenderness.  Musculoskeletal:        General: No tenderness.     Cervical back: Neck supple.  Lymphadenopathy:     Cervical: No cervical adenopathy.  Skin:    General: Skin is warm and dry.     Capillary Refill: Capillary refill takes less than 2 seconds.     Findings: No rash.  Neurological:     Mental Status: He is alert and oriented to person, place, and time.  Psychiatric:        Behavior: Behavior normal.      Vitals:   04/07/22 1351  BP: (!) 142/56  Pulse: 61  Temp: 98.1 F (36.7 C)  TempSrc: Oral  SpO2: 96%  Weight: 145 lb (65.8 kg)   Height:  (1.676 m)   96% on RA BMI Readings from Last 3 Encounters:  04/07/22 23.40 kg/m  02/12/22 23.96 kg/m  02/03/22 24.13 kg/m   Wt Readings from Last 3 Encounters:  04/07/22 145 lb (65.8 kg)  02/12/22 144 lb (65.3 kg)  02/03/22 145 lb (65.8 kg)     CBC    Component Value Date/Time   WBC 9.5 11/01/2021 1149   RBC 4.61 11/01/2021 1149   HGB 13.4 11/01/2021 1149   HCT 40.7 11/01/2021 1149   PLT 171.0 11/01/2021 1149   MCV 88.3 11/01/2021 1149   MCH 28.7 11/02/2020 1428   MCHC 33.0 11/01/2021 1149   RDW 13.7 11/01/2021 1149   LYMPHSABS 2.1 11/01/2021 1149   MONOABS 0.9 11/01/2021 1149   EOSABS 0.1 11/01/2021 1149   BASOSABS 0.1 11/01/2021 1149    Chest Imaging: 12/27/2018 HRCT of the chest: There is evidence of clustered centrilobular and tree-in-bud nodular opacities within  the right lung and some consolidation within the right base.  With distribution was concern for potential aspiration. The patient's images have been independently reviewed by me.    03/16/2018 Clinical Impression Pt presents with functional swallow marked by adequate mastication, timely swallow response, consistent, high penetration of thin liquids (penetrate coats underside of epiglottis and is ejected upon descent of larynx - never reaches vocal folds) - this is considered normal,  #2 on the Penetration-Aspiration Scale.  There is no residue remaining in pharynx post-swallow.  A 13 mm barium pill passed easily through esophagus.  After cessation of exam, pt began coughing, and he and his wife affirmed that this behavior was consistent with the issue he has been describing. Instructed pt to observe if he expectorated barium at any time subsequent to the MBS.  Two hours later, pt called the office to  inform SLP that he expectorated material that he described as looking like the barium he ingested.  No oropharyngeal dysphagia noted on today's exam, but pt would benefit from further esophageal  work-up given his report.   SLP Visit Diagnosis Dysphagia, unspecified (R13.10)     Pulmonary Functions Testing Results:    Latest Ref Rng & Units 01/12/2019   11:33 AM  PFT Results  FVC-Pre L 2.77  P  FVC-Predicted Pre % 87  P  FVC-Post L 2.75  P  FVC-Predicted Post % 86  P  Pre FEV1/FVC % % 78  P  Post FEV1/FCV % % 83  P  FEV1-Pre L 2.16  P  FEV1-Predicted Pre % 97  P  FEV1-Post L 2.29  P  DLCO uncorrected ml/min/mmHg 15.59  P  DLCO UNC% % 75  P  DLVA Predicted % 81  P  TLC L 5.42  P  TLC % Predicted % 89  P  RV % Predicted % 103  P    P Preliminary result      FeNO:  Lab Results  Component Value Date   NITRICOXIDE 23 12/29/2018    Pathology: None   Echocardiogram: None   Heart Catheterization: None     Assessment & Plan:     ICD-10-CM   1. Mild asthma without complication, unspecified whether persistent  J45.909       Discussion:  This is a 87 year old gentleman with a history of a right lower lobe pneumonia had a hospitalization several years ago.  2019 had a swallow study which showed some unspecified dysphagia.  Follow-up imaging showed improvement in his pneumonia.  He had chronic cough and nasal congestion with polyps removed by ENT.  Plan: Continue management with mild asthma with Breo Ellipta. [employer] application to help with medication assistance. Have them get him on a regimen that he can use every single day to prevent these small changes in patient's symptomatology. Samples today of Breo to help with cost. Patient to follow-up with us in 1 year or as needed.   Current Outpatient Medications:    acetaminophen (TYLENOL) 500 MG tablet, Take 500 mg by mouth 2 (two) times daily., Disp: , Rfl:    albuterol (PROVENTIL) (2.5 MG/3ML) 0.083% nebulizer solution, Take 3 mLs (2.5 mg total) by nebulization every 6 (six) hours as needed for wheezing or shortness of breath., Disp: 90 vial, Rfl: 3   albuterol (VENTOLIN HFA) 108 (90 Base) MCG/ACT inhaler, INHALE  2 PUFFS INTO LUNGS EVERY 6 HOURS AS NEEDED FOR WHEEZING OR SHORTNESS OF BREATH., Disp: 8.5 g, Rfl: 3   amLODipine (NORVASC) 5 MG tablet,  Take 1 tablet (5 mg total) by mouth daily., Disp: 90 tablet, Rfl: 3   aspirin EC 81 MG tablet, Take 81 mg by mouth daily., Disp: , Rfl:    atorvastatin (LIPITOR) 80 MG tablet, Take 1 tablet (80 mg total) by mouth daily., Disp: 90 tablet, Rfl: 3   azelastine (ASTELIN) 0.1 % nasal spray, Place 1 spray into both nostrils 2 (two) times daily., Disp: 30 mL, Rfl: 5   carvedilol (COREG) 6.25 MG tablet, Take 1 tablet (6.25 mg total) by mouth 2 (two) times daily with a meal., Disp: 180 tablet, Rfl: 3   cholecalciferol (VITAMIN D3) 25 MCG (1000 UT) tablet, Take 1,000 Units by mouth daily., Disp: , Rfl:    donepezil (ARICEPT) 10 MG tablet, Take 1 tablet (10 mg total) by mouth daily., Disp: 90 tablet, Rfl: 1   Eyelid Cleansers (OCUSOFT EYELID CLEANSING) PADS, Place 1 application into both eyes 2 (two) times daily., Disp: , Rfl:    fish oil-omega-3 fatty acids 1000 MG capsule, Take 2 capsules (2 g total) by mouth 2 (two) times daily., Disp: 360 capsule, Rfl: 3   fluticasone (FLONASE) 50 MCG/ACT nasal spray, 1 spray each nostril twice daily, Disp: 16 g, Rfl: 11   fluticasone furoate-vilanterol (BREO ELLIPTA) 100-25 MCG/INH AEPB, Inhale 1 puff into the lungs daily., Disp: 28 each, Rfl: 0   fluticasone furoate-vilanterol (BREO ELLIPTA) 100-25 MCG/INH AEPB, INHALE 1 PUFF INTO THE LUNGS DAILY, Disp: 180 each, Rfl: 3   gabapentin (NEURONTIN) 100 MG capsule, Take 2 capsules (200 mg total) by mouth at bedtime., Disp: 180 capsule, Rfl: 1   gabapentin (NEURONTIN) 300 MG capsule, TAKE 1 CAPSULE(300 MG) BY MOUTH THREE TIMES DAILY, Disp: 90 capsule, Rfl: 3   hydrochlorothiazide (HYDRODIURIL) 25 MG tablet, Take 1 tablet (25 mg total) by mouth daily., Disp: 90 tablet, Rfl: 3   montelukast (SINGULAIR) 10 MG tablet, TAKE 1 TABLET BY MOUTH AT BEDTIME. GENERIC EQUIVALENT FOR SINGULAIR., Disp: 90  tablet, Rfl: 3   ofloxacin (OCUFLOX) 0.3 % ophthalmic solution, INSTILL 1 DROP IN LEFT EYE FOUR TIMES DAILY FOR 7 DAYS BEGINNING AFTER SURGERY, Disp: , Rfl:    Polyvinyl Alcohol-Povidone (REFRESH OP), Place 1 drop into both eyes 2 (two) times daily., Disp: , Rfl:    Saw Palmetto 500 MG CAPS, Take 1,000 mg by mouth 2 (two) times daily. , Disp: , Rfl:    Josephine Igo, DO [facility] 04/07/2022 2:02 PM

## 2022-04-07 NOTE — Addendum Note (Signed)
Addended by: Fran Lowes on: 04/07/2022 05:11 PM   Modules accepted: Orders

## 2022-04-07 NOTE — Telephone Encounter (Signed)
Rx refilled.

## 2022-04-11 NOTE — Progress Notes (Unsigned)
[name] [address] [place] [address] Phone: [phone] Subjective:   [name], am serving as a scribe for Dr. Antoine Primas.  I'm seeing this patient by the request  of:  Sheliah Hatch, MD  CC: Right hamstring pain  [id number]  12/23/2021 Patient given injection and tolerated the procedure well, discussed icing regimen and home exercises, discussed which activities to do and which ones to avoid.  Hopefully this will make some improvement.  Differential also includes the piriformis as well as the lumbar pathology.  Continues to have the pain on a fairly regular basis.  Still seems to be only with certain range of motion now.  Follow-up with me again in 6 to 8 weeks  Update 04/14/2022 [name] is a 87 y.o. male coming in with complaint of R hamstring pain. Patient states doing better than before. The intensity of the pain has decreased. Injection helped. Patient states that he is feeling significantly better.  Very minimal discomfort whatsoever.  The best results he has had in quite some time and the least amount of pain.     Past Medical History:  Diagnosis Date   Allergy    Arthritis    "scattered" (10/26/2018)   Asthma    C. difficile diarrhea    CAD (coronary artery disease) 2005   s/p PCI of RCA   Carotid artery stenosis    1 to 39% right ICA and 40 to 59% left ICA stenosis by Dopplers 01/2022   Carotid stenosis    "? side; Dr. Edilia Bo"   Cataract    NS OD   Cognitive impairment, mild, so stated    Colitis    Erectile dysfunction    GERD (gastroesophageal reflux disease)    Hyperlipidemia    Hypertension    Perforated stomach, acute 10/23/2018   Peripheral vascular disease (HCC)    Retinal detachment    Vertebrobasilar artery syndrome    Vertigo    Past Surgical History:  Procedure Laterality Date   APPENDECTOMY  1954   CATARACT EXTRACTION Left 08/08/2019   Dr. Hazle Quant   COLONOSCOPY      ESOPHAGOGASTRODUODENOSCOPY (EGD) WITH ESOPHAGEAL DILATION  09/2018   EYE SURGERY     LAPAROTOMY N/A 10/23/2018   Procedure: EXPLORATORY LAPAROTOMY WITH GRAHAM PATCH FOR PERFORATED ULCER AND PLACEMENT OF GASTRIC TUBE;  Surgeon: Emelia Loron, MD;  Location: [facility] OR;  Service: General;  Laterality: N/A;   NASAL SINUS SURGERY  05/2018   RETINAL DETACHMENT SURGERY     "? side"   UPPER GASTROINTESTINAL ENDOSCOPY     Social History   Socioeconomic History   Marital status: Married    Spouse name: Not on file   Number of children: 3   Years of education: Not on file   Highest education level: Not on file  Occupational History   Occupation: Retired Company secretary Captain/[employer]    Employer: RETIRED  Tobacco Use   Smoking status: Former    Types: Cigars    Quit date: 05/13/1961    Years since quitting: 60.9    Passive exposure: Never   Smokeless tobacco: Never   Tobacco comments:    "never inhaled" in the early 60's   Vaping Use   Vaping Use: Never used  Substance and Sexual Activity   Alcohol use: Yes    Alcohol/week: 14.0 standard drinks of alcohol    Types: 14 Glasses of wine per week   Drug use: Never  Sexual activity: Not Currently  Other Topics Concern   Not on file  Social History Narrative   Married with 1 son and 2 daughters.  Retired.   2 caffeinated beverages daily, 3 glasses of wine daily   Never smoker no drug use no tobacco   Social Determinants of Health   Financial Resource Strain: Low Risk  (03/11/2021)   Overall Financial Resource Strain (CARDIA)    Difficulty of Paying Living Expenses: Not hard at all  Food Insecurity: No Food Insecurity (03/11/2021)   Hunger Vital Sign    Worried About Running Out of Food in the Last Year: Never true    Ran Out of Food in the Last Year: Never true  Transportation Needs: No Transportation Needs (03/11/2021)   PRAPARE - Administrator, Civil Service (Medical): No    Lack of Transportation (Non-Medical): No   Physical Activity: Inactive (03/11/2021)   Exercise Vital Sign    Days of Exercise per Week: 0 days    Minutes of Exercise per Session: 0 min  Stress: No Stress Concern Present (03/11/2021)   [employer] of Occupational Health - Occupational Stress Questionnaire    Feeling of Stress : Not at all  Social Connections: Moderately Integrated (03/11/2021)   Social Connection and Isolation Panel [NHANES]    Frequency of Communication with Friends and Family: More than three times a week    Frequency of Social Gatherings with Friends and Family: More than three times a week    Attends Religious Services: Never    Database administrator or Organizations: Yes    Attends Engineer, structural: More than 4 times per year    Marital Status: Married   Allergies  Allergen Reactions   Neosporin [Bacitracin-Polymyxin B] Other (See Comments)    Redness   Family History  Problem Relation Age of Onset   Stroke Mother    Hyperlipidemia Mother    Hypertension Mother    Deep vein thrombosis Mother    Stroke Father    Hyperlipidemia Father    Hypertension Father    Deep vein thrombosis Father    Diverticulitis Father    Ulcerative colitis Father    Colon polyps Father    Colon cancer Neg Hx    Esophageal cancer Neg Hx    Stomach cancer Neg Hx    Rectal cancer Neg Hx      Current Outpatient Medications (Cardiovascular):    amLODipine (NORVASC) 5 MG tablet, Take 1 tablet (5 mg total) by mouth daily.   atorvastatin (LIPITOR) 80 MG tablet, Take 1 tablet (80 mg total) by mouth daily.   carvedilol (COREG) 6.25 MG tablet, Take 1 tablet (6.25 mg total) by mouth 2 (two) times daily with a meal.   hydrochlorothiazide (HYDRODIURIL) 25 MG tablet, Take 1 tablet (25 mg total) by mouth daily.  Current Outpatient Medications (Respiratory):    albuterol (PROVENTIL) (2.5 MG/3ML) 0.083% nebulizer solution, Take 3 mLs (2.5 mg total) by nebulization every 6 (six) hours as needed for wheezing or  shortness of breath.   albuterol (VENTOLIN HFA) 108 (90 Base) MCG/ACT inhaler, INHALE 2 PUFFS INTO LUNGS EVERY 6 HOURS AS NEEDED FOR WHEEZING OR SHORTNESS OF BREATH.   azelastine (ASTELIN) 0.1 % nasal spray, Place 1 spray into both nostrils 2 (two) times daily.   fluticasone (FLONASE) 50 MCG/ACT nasal spray, 1 spray each nostril twice daily   fluticasone furoate-vilanterol (BREO ELLIPTA) 100-25 MCG/ACT AEPB, Inhale 1 puff into the lungs daily.  fluticasone furoate-vilanterol (BREO ELLIPTA) 100-25 MCG/INH AEPB, Inhale 1 puff into the lungs daily.   fluticasone furoate-vilanterol (BREO ELLIPTA) 100-25 MCG/INH AEPB, INHALE 1 PUFF INTO THE LUNGS DAILY   montelukast (SINGULAIR) 10 MG tablet, TAKE 1 TABLET BY MOUTH AT BEDTIME. GENERIC EQUIVALENT FOR SINGULAIR.  Current Outpatient Medications (Analgesics):    acetaminophen (TYLENOL) 500 MG tablet, Take 500 mg by mouth 2 (two) times daily.   aspirin EC 81 MG tablet, Take 81 mg by mouth daily.   Current Outpatient Medications (Other):    cholecalciferol (VITAMIN D3) 25 MCG (1000 UT) tablet, Take 1,000 Units by mouth daily.   donepezil (ARICEPT) 10 MG tablet, Take 1 tablet (10 mg total) by mouth daily.   Eyelid Cleansers (OCUSOFT EYELID CLEANSING) PADS, Place 1 application into both eyes 2 (two) times daily.   fish oil-omega-3 fatty acids 1000 MG capsule, Take 2 capsules (2 g total) by mouth 2 (two) times daily.   gabapentin (NEURONTIN) 100 MG capsule, Take 2 capsules (200 mg total) by mouth at bedtime.   gabapentin (NEURONTIN) 300 MG capsule, TAKE 1 CAPSULE(300 MG) BY MOUTH THREE TIMES DAILY   ofloxacin (OCUFLOX) 0.3 % ophthalmic solution, INSTILL 1 DROP IN LEFT EYE FOUR TIMES DAILY FOR 7 DAYS BEGINNING AFTER SURGERY   Polyvinyl Alcohol-Povidone (REFRESH OP), Place 1 drop into both eyes 2 (two) times daily.   Saw Palmetto 500 MG CAPS, Take 1,000 mg by mouth 2 (two) times daily.      Objective  Blood pressure 130/64, pulse 84, height 5\' 6"  (1.676  m), weight 145 lb (65.8 kg), SpO2 95 %.   General: No apparent distress alert and oriented x3 mood and affect normal, dressed appropriately.  HEENT: Pupils equal, extraocular movements intact  Respiratory: Patient's speak in full sentences and does not appear short of breath  Cardiovascular: No lower extremity edema, non tender, no erythema  Low back exam very mild loss of lordosis.  Not having as much tenderness over the hamstring tendon mild loss of lordosis of the lumbar spine as stated above    Impression and Recommendations:     The above documentation has been reviewed and is accurate and complete , DO

## 2022-04-14 ENCOUNTER — Ambulatory Visit: Payer: Self-pay

## 2022-04-14 ENCOUNTER — Ambulatory Visit: Payer: Medicare Other | Admitting: Family Medicine

## 2022-04-14 VITALS — BP 130/64 | HR 84 | Ht 66.0 in | Wt 145.0 lb

## 2022-04-14 DIAGNOSIS — M79604 Pain in right leg: Secondary | ICD-10-CM

## 2022-04-14 DIAGNOSIS — M76892 Other specified enthesopathies of left lower limb, excluding foot: Secondary | ICD-10-CM

## 2022-04-14 NOTE — Assessment & Plan Note (Signed)
Much better after the injection.  Seems to be better than the epidurals.  Follow-up with me again in 3 to 4 months we can repeat as necessary

## 2022-04-14 NOTE — Patient Instructions (Signed)
Glad you're doing well Keep it up See you again in 3-4 months

## 2022-05-07 ENCOUNTER — Telehealth: Payer: Self-pay | Admitting: Pulmonary Disease

## 2022-05-07 NOTE — Telephone Encounter (Signed)
Pt dropped off information about his Breo inhaler. States the price that he had to pay for the [facility] on 01/14/22 for a 30-day supply of his Virgel Bouquet was $90. Pt said the retail price of the Virgel Bouquet is $477.99. Pt stated for Breo he would hit the donut hole in 9 months compared to the cost of the inhaler. States that prior has been given samples of Breo which did really help him.   Pt also stated that last year his cost of the [facility] for a 30-day supply was $37 which has gone up.  With everything being said, can we see if a prior auth might need to be done, or see if there are any other inhalers that are cheaper for pt compared to the [facility].   Please advise. Routing to prior auth team as well as Dr. Tonia Brooms.

## 2022-05-08 ENCOUNTER — Ambulatory Visit (INDEPENDENT_AMBULATORY_CARE_PROVIDER_SITE_OTHER): Payer: Medicare Other

## 2022-05-08 DIAGNOSIS — Z0000 Encounter for general adult medical examination without abnormal findings: Secondary | ICD-10-CM

## 2022-05-08 NOTE — Patient Instructions (Signed)
[name] , Thank you for taking time to come for your Medicare Wellness Visit. I appreciate your ongoing commitment to your health goals. Please review the following plan we discussed and let me know if I can assist you in the future.   Screening recommendations/referrals: Colonoscopy: no longer required  Recommended yearly ophthalmology/optometry visit for glaucoma screening and checkup Recommended yearly dental visit for hygiene and checkup  Vaccinations: Influenza vaccine: completed  Pneumococcal vaccine: completed  Tdap vaccine: 05/31/2019 Shingles vaccine: completed     Advanced directives: yes   Conditions/risks identified: none   Next appointment: none   Preventive Care 65 Years and Older, Male Preventive care refers to lifestyle choices and visits with your health care provider that can promote health and wellness. What does preventive care include? A yearly physical exam. This is also called an annual well check. Dental exams once or twice a year. Routine eye exams. Ask your health care provider how often you should have your eyes checked. Personal lifestyle choices, including: Daily care of your teeth and gums. Regular physical activity. Eating a healthy diet. Avoiding tobacco and drug use. Limiting alcohol use. Practicing safe sex. Taking low doses of aspirin every day. Taking vitamin and mineral supplements as recommended by your health care provider. What happens during an annual well check? The services and screenings done by your health care provider during your annual well check will depend on your age, overall health, lifestyle risk factors, and family history of disease. Counseling  Your health care provider may ask you questions about your: Alcohol use. Tobacco use. Drug use. Emotional well-being. Home and relationship well-being. Sexual activity. Eating habits. History of falls. Memory and ability to understand (cognition). Work and work  Astronomer. Screening  You may have the following tests or measurements: Height, weight, and BMI. Blood pressure. Lipid and cholesterol levels. These may be checked every 5 years, or more frequently if you are over 21 years old. Skin check. Lung cancer screening. You may have this screening every year starting at age 76 if you have a 30-pack-year history of smoking and currently smoke or have quit within the past 15 years. Fecal occult blood test (FOBT) of the stool. You may have this test every year starting at age 20. Flexible sigmoidoscopy or colonoscopy. You may have a sigmoidoscopy every 5 years or a colonoscopy every 10 years starting at age 2. Prostate cancer screening. Recommendations will vary depending on your family history and other risks. Hepatitis C blood test. Hepatitis B blood test. Sexually transmitted disease (STD) testing. Diabetes screening. This is done by checking your blood sugar (glucose) after you have not eaten for a while (fasting). You may have this done every 1-3 years. Abdominal aortic aneurysm (AAA) screening. You may need this if you are a current or former smoker. Osteoporosis. You may be screened starting at age 72 if you are at high risk. Talk with your health care provider about your test results, treatment options, and if necessary, the need for more tests. Vaccines  Your health care provider may recommend certain vaccines, such as: Influenza vaccine. This is recommended every year. Tetanus, diphtheria, and acellular pertussis (Tdap, Td) vaccine. You may need a Td booster every 10 years. Zoster vaccine. You may need this after age 77. Pneumococcal 13-valent conjugate (PCV13) vaccine. One dose is recommended after age 39. Pneumococcal polysaccharide (PPSV23) vaccine. One dose is recommended after age 29. Talk to your health care provider about which screenings and vaccines you need and how  often you need them. This information is not intended to replace  advice given to you by your health care provider. Make sure you discuss any questions you have with your health care provider. Document Released: 11/09/2015 Document Revised: 07/02/2016 Document Reviewed: 08/14/2015 Elsevier Interactive Patient Education  2017 [place] Prevention in the Home Falls can cause injuries. They can happen to people of all ages. There are many things you can do to make your home safe and to help prevent falls. What can I do on the outside of my home? Regularly fix the edges of walkways and driveways and fix any cracks. Remove anything that might make you trip as you walk through a door, such as a raised step or threshold. Trim any bushes or trees on the path to your home. Use bright outdoor lighting. Clear any walking paths of anything that might make someone trip, such as rocks or tools. Regularly check to see if handrails are loose or broken. Make sure that both sides of any steps have handrails. Any raised decks and porches should have guardrails on the edges. Have any leaves, snow, or ice cleared regularly. Use sand or salt on walking paths during winter. Clean up any spills in your garage right away. This includes oil or grease spills. What can I do in the bathroom? Use night lights. Install grab bars by the toilet and in the tub and shower. Do not use towel bars as grab bars. Use non-skid mats or decals in the tub or shower. If you need to sit down in the shower, use a plastic, non-slip stool. Keep the floor dry. Clean up any water that spills on the floor as soon as it happens. Remove soap buildup in the tub or shower regularly. Attach bath mats securely with double-sided non-slip rug tape. Do not have throw rugs and other things on the floor that can make you trip. What can I do in the bedroom? Use night lights. Make sure that you have a light by your bed that is easy to reach. Do not use any sheets or blankets that are too big for your bed.  They should not hang down onto the floor. Have a firm chair that has side arms. You can use this for support while you get dressed. Do not have throw rugs and other things on the floor that can make you trip. What can I do in the kitchen? Clean up any spills right away. Avoid walking on wet floors. Keep items that you use a lot in easy-to-reach places. If you need to reach something above you, use a strong step stool that has a grab bar. Keep electrical cords out of the way. Do not use floor polish or wax that makes floors slippery. If you must use wax, use non-skid floor wax. Do not have throw rugs and other things on the floor that can make you trip. What can I do with my stairs? Do not leave any items on the stairs. Make sure that there are handrails on both sides of the stairs and use them. Fix handrails that are broken or loose. Make sure that handrails are as long as the stairways. Check any carpeting to make sure that it is firmly attached to the stairs. Fix any carpet that is loose or worn. Avoid having throw rugs at the top or bottom of the stairs. If you do have throw rugs, attach them to the floor with carpet tape. Make sure that you have a  light switch at the top of the stairs and the bottom of the stairs. If you do not have them, ask someone to add them for you. What else can I do to help prevent falls? Wear shoes that: Do not have high heels. Have rubber bottoms. Are comfortable and fit you well. Are closed at the toe. Do not wear sandals. If you use a stepladder: Make sure that it is fully opened. Do not climb a closed stepladder. Make sure that both sides of the stepladder are locked into place. Ask someone to hold it for you, if possible. Clearly mark and make sure that you can see: Any grab bars or handrails. First and last steps. Where the edge of each step is. Use tools that help you move around (mobility aids) if they are needed. These  include: Canes. Walkers. Scooters. Crutches. Turn on the lights when you go into a dark area. Replace any light bulbs as soon as they burn out. Set up your furniture so you have a clear path. Avoid moving your furniture around. If any of your floors are uneven, fix them. If there are any pets around you, be aware of where they are. Review your medicines with your doctor. Some medicines can make you feel dizzy. This can increase your chance of falling. Ask your doctor what other things that you can do to help prevent falls. This information is not intended to replace advice given to you by your health care provider. Make sure you discuss any questions you have with your health care provider. Document Released: 08/09/2009 Document Revised: 03/20/2016 Document Reviewed: 11/17/2014 Elsevier Interactive Patient Education  2017 [employer].

## 2022-05-08 NOTE — Progress Notes (Signed)
Subjective:   [name] is a 87 y.o. male who presents for an Subsequent Medicare Annual Wellness Visit.   I connected with [name]  today by telephone and verified that I am speaking with the correct person using two identifiers. Location patient: home Location provider: work Persons participating in the virtual visit: patient, provider.   I discussed the limitations, risks, security and privacy concerns of performing an evaluation and management service by telephone and the availability of in person appointments. I also discussed with the patient that there may be a patient responsible charge related to this service. The patient expressed understanding and verbally consented to this telephonic visit.    Interactive audio and video telecommunications were attempted between this provider and patient, however failed, due to patient having technical difficulties OR patient did not have access to video capability.  We continued and completed visit with audio only.    Review of Systems     Cardiac Risk Factors include: advanced age (>39men, >40 women);male gender     Objective:    Today's Vitals   There is no height or weight on file to calculate BMI.     05/08/2022    1:21 PM 03/11/2021    8:21 AM 03/07/2020   12:11 PM 12/21/2018    1:59 PM 10/26/2018    1:52 PM 10/23/2018   12:43 PM 10/04/2018    1:13 PM  Advanced Directives  Does Patient Have a Medical Advance Directive? Yes Yes Yes Yes Yes Yes Yes  Type of Estate agent of [place];Living will Living will Living will;Healthcare Power of [employer] Power of [place];Living will Living will Healthcare Power of [place];Living will Living will;Healthcare Power of Attorney  Does patient want to make changes to medical advance directive?   No - Patient declined  No - Patient declined    Copy of Healthcare Power of Attorney in Chart? No - copy requested  No - copy requested        Current  Medications (verified) Outpatient Encounter Medications as of 05/08/2022  Medication Sig   acetaminophen (TYLENOL) 500 MG tablet Take 500 mg by mouth 2 (two) times daily.   albuterol (PROVENTIL) (2.5 MG/3ML) 0.083% nebulizer solution Take 3 mLs (2.5 mg total) by nebulization every 6 (six) hours as needed for wheezing or shortness of breath.   albuterol (VENTOLIN HFA) 108 (90 Base) MCG/ACT inhaler INHALE 2 PUFFS INTO LUNGS EVERY 6 HOURS AS NEEDED FOR WHEEZING OR SHORTNESS OF BREATH.   amLODipine (NORVASC) 5 MG tablet Take 1 tablet (5 mg total) by mouth daily.   aspirin EC 81 MG tablet Take 81 mg by mouth daily.   atorvastatin (LIPITOR) 80 MG tablet Take 1 tablet (80 mg total) by mouth daily.   azelastine (ASTELIN) 0.1 % nasal spray Place 1 spray into both nostrils 2 (two) times daily.   carvedilol (COREG) 6.25 MG tablet Take 1 tablet (6.25 mg total) by mouth 2 (two) times daily with a meal.   cholecalciferol (VITAMIN D3) 25 MCG (1000 UT) tablet Take 1,000 Units by mouth daily.   donepezil (ARICEPT) 10 MG tablet Take 1 tablet (10 mg total) by mouth daily.   Eyelid Cleansers (OCUSOFT EYELID CLEANSING) PADS Place 1 application into both eyes 2 (two) times daily.   fish oil-omega-3 fatty acids 1000 MG capsule Take 2 capsules (2 g total) by mouth 2 (two) times daily.   fluticasone (FLONASE) 50 MCG/ACT nasal spray 1 spray each nostril twice daily   fluticasone  furoate-vilanterol (BREO ELLIPTA) 100-25 MCG/ACT AEPB Inhale 1 puff into the lungs daily.   fluticasone furoate-vilanterol (BREO ELLIPTA) 100-25 MCG/INH AEPB Inhale 1 puff into the lungs daily.   fluticasone furoate-vilanterol (BREO ELLIPTA) 100-25 MCG/INH AEPB INHALE 1 PUFF INTO THE LUNGS DAILY   gabapentin (NEURONTIN) 100 MG capsule Take 2 capsules (200 mg total) by mouth at bedtime.   gabapentin (NEURONTIN) 300 MG capsule TAKE 1 CAPSULE(300 MG) BY MOUTH THREE TIMES DAILY   hydrochlorothiazide (HYDRODIURIL) 25 MG tablet Take 1 tablet (25 mg  total) by mouth daily.   montelukast (SINGULAIR) 10 MG tablet TAKE 1 TABLET BY MOUTH AT BEDTIME. GENERIC EQUIVALENT FOR SINGULAIR.   ofloxacin (OCUFLOX) 0.3 % ophthalmic solution INSTILL 1 DROP IN LEFT EYE FOUR TIMES DAILY FOR 7 DAYS BEGINNING AFTER SURGERY   Polyvinyl Alcohol-Povidone (REFRESH OP) Place 1 drop into both eyes 2 (two) times daily.   Saw Palmetto 500 MG CAPS Take 1,000 mg by mouth 2 (two) times daily.    No facility-administered encounter medications on file as of 05/08/2022.    Allergies (verified) Neosporin [bacitracin-polymyxin b]   History: Past Medical History:  Diagnosis Date   Allergy    Arthritis    "scattered" (10/26/2018)   Asthma    C. difficile diarrhea    CAD (coronary artery disease) 2005   s/p PCI of RCA   Carotid artery stenosis    1 to 39% right ICA and 40 to 59% left ICA stenosis by Dopplers 01/2022   Carotid stenosis    "? side; Dr. Edilia Bo"   Cataract    NS OD   Cognitive impairment, mild, so stated    Colitis    Erectile dysfunction    GERD (gastroesophageal reflux disease)    Hyperlipidemia    Hypertension    Perforated stomach, acute 10/23/2018   Peripheral vascular disease (HCC)    Retinal detachment    Vertebrobasilar artery syndrome    Vertigo    Past Surgical History:  Procedure Laterality Date   APPENDECTOMY  1954   CATARACT EXTRACTION Left 08/08/2019   Dr. Hazle Quant   COLONOSCOPY     ESOPHAGOGASTRODUODENOSCOPY (EGD) WITH ESOPHAGEAL DILATION  09/2018   EYE SURGERY     LAPAROTOMY N/A 10/23/2018   Procedure: EXPLORATORY LAPAROTOMY WITH GRAHAM PATCH FOR PERFORATED ULCER AND PLACEMENT OF GASTRIC TUBE;  Surgeon: Emelia Loron, MD;  Location: [facility] OR;  Service: General;  Laterality: N/A;   NASAL SINUS SURGERY  05/2018   RETINAL DETACHMENT SURGERY     "? side"   UPPER GASTROINTESTINAL ENDOSCOPY     Family History  Problem Relation Age of Onset   Stroke Mother    Hyperlipidemia Mother    Hypertension Mother    Deep vein  thrombosis Mother    Stroke Father    Hyperlipidemia Father    Hypertension Father    Deep vein thrombosis Father    Diverticulitis Father    Ulcerative colitis Father    Colon polyps Father    Colon cancer Neg Hx    Esophageal cancer Neg Hx    Stomach cancer Neg Hx    Rectal cancer Neg Hx    Social History   Socioeconomic History   Marital status: Married    Spouse name: Not on file   Number of children: 3   Years of education: Not on file   Highest education level: Not on file  Occupational History   Occupation: Retired Company secretary Captain/[employer]    Employer: RETIRED  Tobacco Use  Smoking status: Former    Types: Cigars    Quit date: 05/13/1961    Years since quitting: 61.0    Passive exposure: Never   Smokeless tobacco: Never   Tobacco comments:    "never inhaled" in the early 60's   Vaping Use   Vaping Use: Never used  Substance and Sexual Activity   Alcohol use: Yes    Alcohol/week: 14.0 standard drinks of alcohol    Types: 14 Glasses of wine per week   Drug use: Never   Sexual activity: Not Currently  Other Topics Concern   Not on file  Social History Narrative   Married with 1 son and 2 daughters.  Retired.   2 caffeinated beverages daily, 3 glasses of wine daily   Never smoker no drug use no tobacco   Social Determinants of Health   Financial Resource Strain: Low Risk  (05/08/2022)   Overall Financial Resource Strain (CARDIA)    Difficulty of Paying Living Expenses: Not hard at all  Food Insecurity: No Food Insecurity (05/08/2022)   Hunger Vital Sign    Worried About Running Out of Food in the Last Year: Never true    Ran Out of Food in the Last Year: Never true  Transportation Needs: No Transportation Needs (05/08/2022)   PRAPARE - Administrator, Civil Service (Medical): No    Lack of Transportation (Non-Medical): No  Physical Activity: Insufficiently Active (05/08/2022)   Exercise Vital Sign    Days of Exercise per Week: 3 days    Minutes  of Exercise per Session: 30 min  Stress: No Stress Concern Present (05/08/2022)   [employer] of Occupational Health - Occupational Stress Questionnaire    Feeling of Stress : Not at all  Social Connections: Moderately Integrated (05/08/2022)   Social Connection and Isolation Panel [NHANES]    Frequency of Communication with Friends and Family: Three times a week    Frequency of Social Gatherings with Friends and Family: Three times a week    Attends Religious Services: Never    Active Member of Clubs or Organizations: Yes    Attends Engineer, structural: More than 4 times per year    Marital Status: Married    Tobacco Counseling Counseling given: Not Answered Tobacco comments: "never inhaled" in the early 60's    Clinical Intake:  Pre-visit preparation completed: Yes  Pain : No/denies pain     Nutritional Risks: None Diabetes: No  How often do you need to have someone help you when you read instructions, pamphlets, or other written materials from your doctor or pharmacy?: 1 - Never What is the last grade level you completed in school?: masters  Diabetic?no   Interpreter Needed?: No  Information entered by :: L.Shantella Blubaugh,LPN   Activities of Daily Living    05/08/2022    1:25 PM 05/08/2022   10:48 AM  In your present state of health, do you have any difficulty performing the following activities:  Hearing? 0 0  Vision? 0 0  Difficulty concentrating or making decisions? 0 0  Walking or climbing stairs? 0 0  Dressing or bathing? 0 0  Doing errands, shopping? 0 0  Preparing Food and eating ? N N  Using the Toilet? N N  In the past six months, have you accidently leaked urine? N N  Do you have problems with loss of bowel control? N N  Managing your Medications? N N  Managing your Finances? N N  Housekeeping or managing  your Housekeeping? N N    Patient Care Team: Sheliah Hatch, MD as PCP - General Quintella Reichert, MD as PCP - Cardiology  (Cardiology) Quintella Reichert, MD as Consulting Physician (Cardiology) Chuck Hint, MD as Consulting Physician (Vascular Surgery) Jerilee Field, MD as Consulting Physician (Urology) Iva Boop, MD as Consulting Physician (Gastroenterology) Josephine Igo, DO as Consulting Physician (Pulmonary Disease) Jamal Collin, MD as Consulting Physician (Ophthalmology) Erroll Luna, [facility] (Pharmacist)  Indicate any recent Medical Services you may have received from other than [facility] providers in the past year (date may be approximate).     Assessment:   This is a routine wellness examination for Judge.  Hearing/Vision screen Vision Screening - Comments:: Annual eye exams   Dietary issues and exercise activities discussed: Current Exercise Habits: Home exercise routine, Type of exercise: walking, Time (Minutes): 30, Frequency (Times/Week): 3, Weekly Exercise (Minutes/Week): 90, Intensity: Mild, Exercise limited by: None identified   Goals Addressed             This Visit's Progress    Patient Stated   On track    Maintain current health       Depression Screen    05/08/2022    1:23 PM 05/08/2022    1:20 PM 11/01/2021   10:31 AM 05/03/2021   10:03 AM 03/11/2021   11:29 AM 03/11/2021    8:24 AM 11/02/2020    2:01 PM  PHQ 2/9 Scores  PHQ - 2 Score 0 0 0 0 0 0 0  PHQ- 9 Score   1 0 0  0    Fall Risk    05/08/2022    1:23 PM 05/08/2022   10:48 AM 05/04/2022   10:34 AM 04/23/2022    8:33 PM 11/01/2021   10:31 AM  Fall Risk   Falls in the past year? 0 0 0 0   0 0  Number falls in past yr: 0 0 0 0   0   Injury with Fall? 0 0 0 0   0   Risk for fall due to : No Fall Risks    No Fall Risks  Follow up Falls evaluation completed;Education provided    Falls evaluation completed    FALL RISK PREVENTION PERTAINING TO THE HOME:  Any stairs in or around the home? Yes  If so, are there any without handrails? No  Home free of loose throw rugs in walkways, pet  beds, electrical cords, etc? No  Adequate lighting in your home to reduce risk of falls? Yes   ASSISTIVE DEVICES UTILIZED TO PREVENT FALLS:  Life alert? No  Use of a cane, walker or w/c? No  Grab bars in the bathroom? Yes  Shower chair or bench in shower? No  Elevated toilet seat or a handicapped toilet? No     Cognitive Function:  Normal cognitive status assessed by telephone conversation  by this Nurse Health Advisor. No abnormalities found.        03/11/2021    8:28 AM 03/07/2020   12:12 PM  6CIT Screen  What Year? 0 points 0 points  What month? 0 points 0 points  What time? 0 points 0 points  Count back from 20 0 points 0 points  Months in reverse 0 points 0 points  Repeat phrase 0 points 2 points  Total Score 0 points 2 points    Immunizations Immunization History  Administered Date(s) Administered   Fluad Quad(high Dose 65+) 06/24/2019  Influenza Split 08/25/2011, 08/09/2012   Influenza Whole 07/11/2010   Influenza, High Dose Seasonal PF 08/14/2014, 07/16/2018   Influenza,inj,Quad PF,6+ Mos 08/11/2013, 08/18/2014, 07/11/2015, 07/14/2016, 07/20/2017   Influenza-Unspecified 07/24/2000   PFIZER(Purple Top)SARS-COV-2 Vaccination 11/15/2019, 12/06/2019, 07/27/2020, 03/08/2021   Pneumococcal Conjugate-13 01/25/2015   Pneumococcal Polysaccharide-23 09/15/2002   Td 12/13/2007, 05/31/2019   Zoster Recombinat (Shingrix) 05/29/2020, 08/14/2020   Zoster, Live 10/29/2010    TDAP status: Up to date  Flu Vaccine status: Up to date  Pneumococcal vaccine status: Up to date  Covid-19 vaccine status: Completed vaccines  Qualifies for Shingles Vaccine? Yes   Zostavax completed Yes   Shingrix Completed?: Yes  Screening Tests Health Maintenance  Topic Date Due   COVID-19 Vaccine (5 - Pfizer series) 05/03/2021   INFLUENZA VACCINE  05/27/2022   TETANUS/TDAP  05/30/2029   Pneumonia Vaccine 2565+ Years old  Completed   Zoster Vaccines- Shingrix  Completed   HPV VACCINES   Aged Out    Health Maintenance  Health Maintenance Due  Topic Date Due   COVID-19 Vaccine (5 - Pfizer series) 05/03/2021    Colorectal cancer screening: No longer required.   Lung Cancer Screening: (Low Dose CT Chest recommended if Age 59-80 years, 30 pack-year currently smoking OR have quit w/in 15years.) does not qualify.   Lung Cancer Screening Referral: n/a  Additional Screening:  Hepatitis C Screening: does not qualify;   Vision Screening: Recommended annual ophthalmology exams for early detection of glaucoma and other disorders of the eye. Is the patient up to date with their annual eye exam?  Yes  Who is the provider or what is the name of the office in which the patient attends annual eye exams? Dr.Mccloud  If pt is not established with a provider, would they like to be referred to a provider to establish care? No .   Dental Screening: Recommended annual dental exams for proper oral hygiene  Community Resource Referral / Chronic Care Management: CRR required this visit?  No   CCM required this visit?  No      Plan:     I have personally reviewed and noted the following in the patient's chart:   Medical and social history Use of alcohol, tobacco or illicit drugs  Current medications and supplements including opioid prescriptions. Patient is not currently taking opioid prescriptions. Functional ability and status Nutritional status Physical activity Advanced directives List of other physicians Hospitalizations, surgeries, and ER visits in previous 12 months Vitals Screenings to include cognitive, depression, and falls Referrals and appointments  In addition, I have reviewed and discussed with patient certain preventive protocols, quality metrics, and best practice recommendations. A written personalized care plan for preventive services as well as general preventive health recommendations were provided to patient.     March RummageLaura Maurisio Ruddy, LPN   1/61/09607/13/2023    Nurse Notes: none

## 2022-05-12 ENCOUNTER — Ambulatory Visit (INDEPENDENT_AMBULATORY_CARE_PROVIDER_SITE_OTHER): Payer: Medicare Other | Admitting: Family Medicine

## 2022-05-12 ENCOUNTER — Encounter: Payer: Self-pay | Admitting: Family Medicine

## 2022-05-12 VITALS — BP 128/58 | HR 53 | Temp 98.7°F | Resp 16 | Ht 65.0 in | Wt 145.5 lb

## 2022-05-12 DIAGNOSIS — I10 Essential (primary) hypertension: Secondary | ICD-10-CM | POA: Diagnosis not present

## 2022-05-12 DIAGNOSIS — Z0000 Encounter for general adult medical examination without abnormal findings: Secondary | ICD-10-CM

## 2022-05-12 LAB — CBC WITH DIFFERENTIAL/PLATELET
Basophils Absolute: 0.1 10*3/uL (ref 0.0–0.1)
Basophils Relative: 1.1 % (ref 0.0–3.0)
Eosinophils Absolute: 0.1 10*3/uL (ref 0.0–0.7)
Eosinophils Relative: 1.3 % (ref 0.0–5.0)
HCT: 40.4 % (ref 39.0–52.0)
Hemoglobin: 13.6 g/dL (ref 13.0–17.0)
Lymphocytes Relative: 26.1 % (ref 12.0–46.0)
Lymphs Abs: 1.8 10*3/uL (ref 0.7–4.0)
MCHC: 33.5 g/dL (ref 30.0–36.0)
MCV: 87.7 fl (ref 78.0–100.0)
Monocytes Absolute: 0.8 10*3/uL (ref 0.1–1.0)
Monocytes Relative: 12.1 % — ABNORMAL HIGH (ref 3.0–12.0)
Neutro Abs: 4.1 10*3/uL (ref 1.4–7.7)
Neutrophils Relative %: 59.4 % (ref 43.0–77.0)
Platelets: 201 10*3/uL (ref 150.0–400.0)
RBC: 4.61 Mil/uL (ref 4.22–5.81)
RDW: 13.7 % (ref 11.5–15.5)
WBC: 7 10*3/uL (ref 4.0–10.5)

## 2022-05-12 LAB — LIPID PANEL
Cholesterol: 108 mg/dL (ref 0–200)
HDL: 35.9 mg/dL — ABNORMAL LOW (ref >39.00)
LDL Cholesterol: 50 mg/dL (ref 0–99)
NonHDL: 72.34
Total CHOL/HDL Ratio: 3
Triglycerides: 111 mg/dL (ref 0.0–149.0)
VLDL: 22.2 mg/dL (ref 0.0–40.0)

## 2022-05-12 LAB — BASIC METABOLIC PANEL
BUN: 26 mg/dL — ABNORMAL HIGH (ref 6–23)
CO2: 30 mEq/L (ref 19–32)
Calcium: 9.5 mg/dL (ref 8.4–10.5)
Chloride: 101 mEq/L (ref 96–112)
Creatinine, Ser: 1.17 mg/dL (ref 0.40–1.50)
GFR: 57.08 mL/min — ABNORMAL LOW (ref >60.00)
Glucose, Bld: 89 mg/dL (ref 70–99)
Potassium: 4 mEq/L (ref 3.5–5.1)
Sodium: 140 mEq/L (ref 135–145)

## 2022-05-12 LAB — HEPATIC FUNCTION PANEL
ALT: 22 U/L (ref 0–53)
AST: 24 U/L (ref 0–37)
Albumin: 4.3 g/dL (ref 3.5–5.2)
Alkaline Phosphatase: 80 U/L (ref 39–117)
Bilirubin, Direct: 0.1 mg/dL (ref 0.0–0.3)
Total Bilirubin: 0.5 mg/dL (ref 0.2–1.2)
Total Protein: 7.3 g/dL (ref 6.0–8.3)

## 2022-05-12 LAB — TSH: TSH: 3.72 u[IU]/mL (ref 0.35–5.50)

## 2022-05-12 NOTE — Progress Notes (Signed)
   Subjective:    Patient ID: [name], male    DOB: 03/20/1935, 87 y.o.   MRN: [id number]  HPI CPE- UTD on Tdap, PNA.  No longer having colon cancer screening.  Pt reports feeling well.  No concerns  Patient Care Team    Relationship Specialty Notifications Start End  Sheliah Hatch, MD PCP - General   10/09/10   Quintella Reichert, MD PCP - Cardiology Cardiology Admissions 04/22/18   Quintella Reichert, MD Consulting Physician Cardiology  09/13/14   Chuck Hint, MD Consulting Physician Vascular Surgery  03/22/15   Jerilee Field, MD Consulting Physician Urology  04/14/18   Iva Boop, MD Consulting Physician Gastroenterology  04/14/18   Josephine Igo, DO Consulting Physician Pulmonary Disease  11/11/19   Jamal Collin, MD Consulting Physician Ophthalmology  03/07/20   Erroll Luna, [facility]  Pharmacist  12/11/21    Comment: [phone]    Health Maintenance  Topic Date Due   COVID-19 Vaccine (5 - Pfizer series) 05/28/2022 (Originally 05/03/2021)   INFLUENZA VACCINE  05/27/2022   TETANUS/TDAP  05/30/2029   Pneumonia Vaccine 33+ Years old  Completed   Zoster Vaccines- Shingrix  Completed   HPV VACCINES  Aged Out      Review of Systems Patient reports no vision/hearing changes, anorexia, fever ,adenopathy, persistant/recurrent hoarseness, swallowing issues, chest pain, palpitations, edema, persistant/recurrent cough, hemoptysis, dyspnea (rest,exertional, paroxysmal nocturnal), gastrointestinal  bleeding (melena, rectal bleeding), abdominal pain, excessive heart burn, GU symptoms (dysuria, hematuria, voiding/incontinence issues) syncope, focal weakness, memory loss, numbness & tingling, skin/hair/nail changes, depression, anxiety, abnormal bruising/bleeding, musculoskeletal symptoms/signs.     Objective:   Physical Exam General Appearance:    Alert, cooperative, no distress, appears stated age  Head:    Normocephalic, without obvious abnormality,  atraumatic  Eyes:    PERRL, conjunctiva/corneas clear, EOM's intact both eyes       Ears:    Normal TM's and external ear canals, both ears  Nose:   Nares normal, septum midline, mucosa normal, no drainage   or sinus tenderness  Throat:   Lips, mucosa, and tongue normal; teeth and gums normal  Neck:   Supple, symmetrical, trachea midline, no adenopathy;       thyroid:  No enlargement/tenderness/nodules  Back:     Symmetric, no curvature, ROM normal, no CVA tenderness  Lungs:     Clear to auscultation bilaterally, respirations unlabored  Chest wall:    No tenderness or deformity  Heart:    Regular rate and rhythm, S1 and S2 normal, no murmur, rub   or gallop  Abdomen:     Soft, non-tender, bowel sounds active all four quadrants,    no masses, no organomegaly  Genitalia:    Deferred to urology  Rectal:    Extremities:   Extremities normal, atraumatic, no cyanosis or edema  Pulses:   2+ and symmetric all extremities  Skin:   Skin color, texture, turgor normal, no rashes or lesions  Lymph nodes:   Cervical, supraclavicular, and axillary nodes normal  Neurologic:   CNII-XII intact. Normal strength, sensation and reflexes      throughout          Assessment & Plan:

## 2022-05-12 NOTE — Assessment & Plan Note (Signed)
Chronic problem.  Excellent control.  Currently asymptomatic.  Check labs.  No anticipated med changes. 

## 2022-05-12 NOTE — Assessment & Plan Note (Signed)
Pt's PE WNL and unchanged from previous.  UTD on immunizations.  No longer doing colon cancer screening.  Following regularly w/ urology.  Check labs.  Anticipatory guidance provided.

## 2022-05-12 NOTE — Patient Instructions (Addendum)
Follow up in 6 months to recheck BP and cholesterol We'll notify you of your lab results and make any changes if needed Keep up the good work on healthy diet and regular exercise- you look great! Call with any questions or concerns Stay Safe!  Stay Healthy! Have a great summer!!! Happy Early [place] Ouch!!!

## 2022-05-13 ENCOUNTER — Telehealth: Payer: Self-pay

## 2022-05-13 ENCOUNTER — Other Ambulatory Visit (HOSPITAL_COMMUNITY): Payer: Self-pay

## 2022-05-13 NOTE — Telephone Encounter (Signed)
Informed pt of lab results  

## 2022-05-13 NOTE — Telephone Encounter (Signed)
-----   Message from Katherine E Tabori, MD sent at 05/13/2022  7:15 AM EDT ----- Labs look great!  No changes at this time 

## 2022-05-14 NOTE — Telephone Encounter (Signed)
Info from prior auth team: Laqueta Linden  You 20 hours ago (12:39 PM)    Good afternoon! I ran a test claim for the [facility] and I received a copay of $40 for that inhaler.

## 2022-05-15 NOTE — Telephone Encounter (Signed)
Called and spoke with pt letting him know the info from prior auth team and he verbalized understanding. Pt said usually in September he goes in the donut hole. Stated to pt when he does go in the donut hole that he could ask to see if we had any samples and he verbalized understanding. Nothing further needed.

## 2022-05-29 NOTE — Progress Notes (Deleted)
Chronic Care Management Pharmacy Note  05/29/2022 Name:  [name] MRN:  [id number] DOB:  13-Nov-1934  Plan:  Subjective: [name] is an 87 y.o. year old male who is a primary patient of Tabori, Aundra Millet, MD.  The CCM team was consulted for assistance with disease management and care coordination needs.    Engaged with patient by telephone for follow up visit in response to provider referral for pharmacy case management and/or care coordination services.   Consent to Services:  The patient was given information about Chronic Care Management services, agreed to services, and gave verbal consent prior to initiation of services.  Please see initial visit note for detailed documentation.   Patient Care Team: Midge Minium, MD as PCP - General Sueanne Margarita, MD as PCP - Cardiology (Cardiology) Sueanne Margarita, MD as Consulting Physician (Cardiology) Angelia Mould, MD as Consulting Physician (Vascular Surgery) Festus Aloe, MD as Consulting Physician (Urology) Gatha Mayer, MD as Consulting Physician (Gastroenterology) Garner Nash, DO as Consulting Physician (Pulmonary Disease) Chrystine Oiler, MD as Consulting Physician (Ophthalmology) Edythe Clarity, [facility] (Pharmacist)  Objective:  Lab Results  Component Value Date   CREATININE 1.17 05/12/2022   CREATININE 1.06 11/01/2021   CREATININE 1.20 05/03/2021    No results found for: "HGBA1C" Last diabetic Eye exam: No results found for: "HMDIABEYEEXA"  Last diabetic Foot exam: No results found for: "HMDIABFOOTEX"      Component Value Date/Time   CHOL 108 05/12/2022 1023   CHOL 89 (L) 09/27/2019 0810   TRIG 111.0 05/12/2022 1023   HDL 35.90 (L) 05/12/2022 1023   HDL 34 (L) 09/27/2019 0810   CHOLHDL 3 05/12/2022 1023   VLDL 22.2 05/12/2022 1023   [place] 50 05/12/2022 1023   LDLCALC 59 11/02/2020 1428       Latest Ref Rng & Units 05/12/2022   10:23 AM 11/01/2021   11:49 AM  05/03/2021   10:46 AM  Hepatic Function  Total Protein 6.0 - 8.3 g/dL 7.3  6.9  6.9   Albumin 3.5 - 5.2 g/dL 4.3  4.2  4.4   AST 0 - 37 U/L 24  23  24    ALT 0 - 53 U/L 22  25  25    Alk Phosphatase 39 - 117 U/L 80  78  76   Total Bilirubin 0.2 - 1.2 mg/dL 0.5  0.6  0.9   Bilirubin, Direct 0.0 - 0.3 mg/dL 0.1  0.1  0.1     Lab Results  Component Value Date/Time   TSH 3.72 05/12/2022 10:23 AM   TSH 2.90 11/01/2021 11:49 AM       Latest Ref Rng & Units 05/12/2022   10:23 AM 11/01/2021   11:49 AM 05/03/2021   10:46 AM  CBC  WBC 4.0 - 10.5 K/uL 7.0  9.5  7.5   Hemoglobin 13.0 - 17.0 g/dL 13.6  13.4  14.0   Hematocrit 39.0 - 52.0 % 40.4  40.7  42.0   Platelets 150.0 - 400.0 K/uL 201.0  171.0  169.0     No results found for: "VD25OH"  Social History   Tobacco Use  Smoking Status Former   Types: Cigars   Quit date: 05/13/1961   Years since quitting: 61.0   Passive exposure: Never  Smokeless Tobacco Never  Tobacco Comments   "never inhaled" in the early 60's    BP Readings from Last 3 Encounters:  05/12/22 (!) 128/58  04/14/22 130/64  04/07/22 Marland Kitchen)  142/56   Pulse Readings from Last 3 Encounters:  05/12/22 (!) 53  04/14/22 84  04/07/22 61   Wt Readings from Last 3 Encounters:  05/12/22 145 lb 8 oz (66 kg)  04/14/22 145 lb (65.8 kg)  04/07/22 145 lb (65.8 kg)    Assessment: Review of patient past medical history, allergies, medications, health status, including review of consultants reports, laboratory and other test data, was performed as part of comprehensive evaluation and provision of chronic care management services.   SDOH:  (Social Determinants of Health) assessments and interventions performed: Yes   CCM Care Plan  Allergies  Allergen Reactions   Neosporin [Bacitracin-Polymyxin B] Other (See Comments)    Redness    Medications Reviewed Today     Reviewed by Midge Minium, MD (Physician) on 05/12/22 at [place] List Status: <None>   Medication  Order Taking? Sig Documenting Provider Last Dose Status Informant  acetaminophen (TYLENOL) 500 MG tablet [id number] Yes Take 500 mg by mouth 2 (two) times daily. [provider] Taking Active Spouse/Significant Other  albuterol (PROVENTIL) (2.5 MG/3ML) 0.083% nebulizer solution [id number] Yes Take 3 mLs (2.5 mg total) by nebulization every 6 (six) hours as needed for wheezing or shortness of breath. Martyn Ehrich, NP Taking Active   albuterol (VENTOLIN HFA) 108 (90 Base) MCG/ACT inhaler [id number] Yes INHALE 2 PUFFS INTO LUNGS EVERY 6 HOURS AS NEEDED FOR WHEEZING OR SHORTNESS OF BREATH. Martyn Ehrich, NP Taking Active   amLODipine (NORVASC) 5 MG tablet [id number] Yes Take 1 tablet (5 mg total) by mouth daily. Sueanne Margarita, MD Taking Active   aspirin EC 81 MG tablet [id number] Yes Take 81 mg by mouth daily. [provider] Taking Active Spouse/Significant Other  atorvastatin (LIPITOR) 80 MG tablet [id number] Yes Take 1 tablet (80 mg total) by mouth daily. Sueanne Margarita, MD Taking Active   azelastine (ASTELIN) 0.1 % nasal spray [id number] Yes Place 1 spray into both nostrils 2 (two) times daily. Garner Nash, DO Taking Active   carvedilol (COREG) 6.25 MG tablet [id number] Yes Take 1 tablet (6.25 mg total) by mouth 2 (two) times daily with a meal. Turner, Eber Hong, MD Taking Active   cholecalciferol (VITAMIN D3) 25 MCG (1000 UT) tablet [id number] Yes Take 1,000 Units by mouth daily. [provider] Taking Active   donepezil (ARICEPT) 10 MG tablet [id number] Yes Take 1 tablet (10 mg total) by mouth daily. Midge Minium, MD Taking Active   Eyelid Cleansers (OCUSOFT EYELID CLEANSING) PADS [id number] Yes Place 1 application into both eyes 2 (two) times daily. [provider] Taking Active Spouse/Significant Other  fish oil-omega-3 fatty acids 1000 MG capsule [id number] Yes Take 2 capsules (2 g total) by mouth 2 (two) times daily. Sueanne Margarita, MD Taking Active    fluticasone [facility]) 50 MCG/ACT nasal spray [id number] Yes 1 spray each nostril twice daily Martyn Ehrich, NP Taking Active   fluticasone furoate-vilanterol (BREO ELLIPTA) 100-25 MCG/ACT AEPB [id number] Yes Inhale 1 puff into the lungs daily. Garner Nash, DO Taking Active   fluticasone furoate-vilanterol (BREO ELLIPTA) 100-25 MCG/INH AEPB [id number] Yes Inhale 1 puff into the lungs daily. Martyn Ehrich, NP Taking Active   fluticasone furoate-vilanterol (BREO ELLIPTA) 100-25 MCG/INH AEPB [id number] Yes INHALE 1 PUFF INTO THE LUNGS DAILY Icard, Octavio Graves, DO Taking Active   gabapentin (NEURONTIN) 100 MG capsule [id number] No Take 2 capsules (200 mg total) by mouth at bedtime.  Patient not taking: Reported on  05/12/2022   Lyndal Pulley, DO Not Taking Active   gabapentin (NEURONTIN) 300 MG capsule [id number] Yes TAKE 1 CAPSULE(300 MG) BY MOUTH THREE TIMES DAILY Lyndal Pulley, DO Taking Active   hydrochlorothiazide (HYDRODIURIL) 25 MG tablet [id number] Yes Take 1 tablet (25 mg total) by mouth daily. Sueanne Margarita, MD Taking Active   montelukast (SINGULAIR) 10 MG tablet [id number] Yes TAKE 1 TABLET BY MOUTH AT BEDTIME. GENERIC EQUIVALENT FOR SINGULAIR. Martyn Ehrich, NP Taking Active   ofloxacin (OCUFLOX) 0.3 % ophthalmic solution [id number] Yes INSTILL 1 DROP IN LEFT EYE FOUR TIMES DAILY FOR 7 DAYS BEGINNING AFTER SURGERY [provider] Taking Active   Polyvinyl Alcohol-Povidone (REFRESH OP) [id number] Yes Place 1 drop into both eyes 2 (two) times daily. [provider] Taking Active Spouse/Significant Other  Saw Palmetto 500 MG CAPS [id number] Yes Take 1,000 mg by mouth 2 (two) times daily.  [provider] Taking Active Spouse/Significant Other            Patient Active Problem List   Diagnosis Date Noted   Calcific tendonitis of right lower leg 12/23/2021   Right hip pain 09/16/2021   Conductive hearing loss of right ear with unrestricted hearing  of left ear 03/19/2021   Piriformis syndrome of right side 08/07/2020   Chronic maxillary sinusitis 08/03/2020   Hamstring tendinitis of left thigh 03/20/2020   Nasal polyps 06/07/2019   Mild asthma 11/15/2018   Rhinitis, chronic 11/15/2018   History of ETT 10/23/2018   Globus sensation 07/29/2018   Preoperative cardiovascular examination 04/16/2018   Arthropathy of right sacroiliac joint 04/08/2016   Spinal stenosis, lumbar region, with neurogenic claudication 03/26/2016   Sciatica of right side 06/13/2015   CAD (coronary artery disease)    Rotator cuff syndrome of left shoulder 01/27/2013   Carotid artery stenosis 09/02/2012   Physical exam, annual 06/23/2012   Chronic cough 11/23/2009   SKIN LESIONS, MULTIPLE 06/05/2009   SYNDROME, 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   DISEASE, PERIPHERAL VASCULAR NOS 08/02/2007   VERTIGO 08/02/2007   Mild cognitive impairment 04/27/2007   Hyperlipidemia 03/17/2007   Essential hypertension 03/17/2007    Immunization History  Administered Date(s) Administered   Fluad Quad(high Dose 65+) 06/24/2019   Influenza Split 08/25/2011, 08/09/2012   Influenza Whole 07/11/2010   Influenza, High Dose Seasonal PF 08/14/2014, 07/16/2018   Influenza,inj,Quad PF,6+ Mos 08/11/2013, 08/18/2014, 07/11/2015, 07/14/2016, 07/20/2017   Influenza-Unspecified 07/24/2000   PFIZER(Purple Top)SARS-COV-2 Vaccination 11/15/2019, 12/06/2019, 07/27/2020, 03/08/2021   Pneumococcal Conjugate-13 01/25/2015   Pneumococcal Polysaccharide-23 09/15/2002   Td 12/13/2007, 05/31/2019   Zoster Recombinat (Shingrix) 05/29/2020, 08/14/2020   Zoster, Live 10/29/2010    Conditions to be addressed/monitored: HLD, CAD (PCI of RCA), HTN, mild asthma   There are no care plans that you recently modified to display for this patient.    Follow Up Plan:  CPA pt call two months to ensure coverage needs met before/plan for open enrollment. Rph f/u 6 months  Medication Assistance:   TBD  Patient's preferred pharmacy is:  [facility] [place], [place] [address] Phone: [phone] Fax: [phone]  [id number] - [address], [place] AT [facility] [address] [place] [place] [address] Phone: [phone] Fax: [phone]   Follow Up:  Patient agrees to Care Plan and Follow-up.  Future Appointments  Date Time Provider [place]  06/11/2022  3:45 PM [facility] CCM PHARMACIST [facility]  07/21/2022 10:30 AM Lyndal Pulley, DO [facility] None  11/17/2022 10:20 AM Midge Minium, MD [facility]     Current Barriers:  Cost of current Breo inhaler/possible elevated BP  Pharmacist Clinical Goal(s):  Patient will verbalize ability to afford treatment regimen contact provider office for questions/concerns as evidenced notation of same in electronic health record through collaboration with PharmD and provider.   Interventions: 1:1 collaboration with Midge Minium, MD regarding development and update of comprehensive plan of care as evidenced by provider attestation and co-signature Inter-disciplinary care team collaboration (see longitudinal plan of care) Comprehensive medication review performed; medication list updated in electronic medical record  Hypertension  (Status:New goal.)   Med Management Intervention: no changes  (BP goal <130/80) -Controlled -Current treatment: HCTZ 25 mg once daily Appropriate, Query effective, ,  Carvedilol 6.25 mg twice daily Appropriate, Effective, Safe, Accessible Amlodipine 5 mg once daily Appropriate, Query effective,  -Current home readings: not checking -Current exercise habits: exercise very limited due to back pain  -Denies hypotensive/hypertensive symptoms -Educated on Symptoms of hypotension and importance of maintaining adequate hydration; -Counseled to  monitor BP at home as directed, document, and provide log at future appointments -Counseled on diet and exercise extensively Recommended to continue current medication  Update 12/11/21 Home BP readings recently - 139/63, 139/57, 149/60 Recent office BP reading elevated 778E systolic. He denies any symptoms at home of elevated BP. Have asked him to continue to monitor and record.  If BP continues to be elevated Could consider increased dose of amlodipine to 29m daily. Will FU in two weeks on BP readings. Patient to call if they are consistently elevated in the 1423Nsystolic.  Hyperlipidemia: (LDL goal <100 -Controlled -Current treatment: Atorvastatin 80 mg once daily -Medications previously tried: Zetia, Lovaza  -Educated on Cholesterol goals;  -Recommended to continue current medication  Asthma (Goal: control symptoms and prevent exacerbations) -Controlled -Concern with 90+ dollar copay while in donut hole  -Current treatment  Breo Ellipta 100-25 mcg/act 1 puff daily Appropriate, Effective, Safe, Accessible Ventolin - 2 puffs every 6 hours as needed for SOB Appropriate, Effective, Safe, Accessible -Medications previously tried: n/a  -Patient denies consistent use of maintenance inhaler -Counseled on Proper inhaler technique; Benefits of consistent maintenance inhaler use -Recommended to continue current medication Assessed patient finances. Does not qualify for PAP. Reviewed alternative ICS/LABA inhalers through insurance - BMemory Dancewould still be cheapest option for out of pocket spending both before and during coverage gap. Given this, I would suggest patient contact [employer] (Toll Free [phone] as they deal with multiple assistance programs/plans. Another option would be for patient to apply to a foundation such as [employer](currently wait list only for asthma) to receive financial support with medication copays  Update 12/11/21 He reports using his albuterol inhaler  about once daily in the evening.  Discussed importance of using maintenance inhaler daily and albuterol as rescue.  Reports copay for BMemory Danceremains the same - he is unsure of what will happen this year as he got new insurance. Encouraged him to contact [place] any [place] and we can look for alternatives that may be better. No changes to medications at this time - continue to follow for adherence and copay assistance. Provided direct line for contact regarding medication questions.  Patient Goals/Self-Care Activities Patient will:  - take medications as prescribed collaborate with provider on medication access solutions

## 2022-06-04 NOTE — Progress Notes (Signed)
Chronic Care Management Pharmacy Note  06/18/2022 Name:  [name] MRN:  [id number] DOB:  07-22-1935   Subjective: [name] is an 87 y.o. year old male who is a primary patient of Tabori, Aundra Millet, MD.  The CCM team was consulted for assistance with disease management and care coordination needs.    Engaged with patient by telephone for follow up visit in response to provider referral for pharmacy case management and/or care coordination services.   Consent to Services:  The patient was given information about Chronic Care Management services, agreed to services, and gave verbal consent prior to initiation of services.  Please see initial visit note for detailed documentation.     Recent office visits:  None noted.    Recent consult visits:  02/12/22 Dagoberto Ligas PA-C - Vascular Surgery - Bilateral extracranial CAD - No medication changes. Follow up and repeat Carotid duplex in 1 year.    02/03/22 [facility] - Calcific tendonitis of right lower leg - No changes. Follow up in 3 months.    12/23/21  [facility] - Calcific tendonitis of right lower leg - [place] limited joint space lower right ordered. Hamstring injection in joint in office without complication. Follow up in 4/8 weeks.    Hospital visits:  None in previous 6 months  Patient Care Team: Midge Minium, MD as PCP - General Sueanne Margarita, MD as PCP - Cardiology (Cardiology) Sueanne Margarita, MD as Consulting Physician (Cardiology) Angelia Mould, MD as Consulting Physician (Vascular Surgery) Festus Aloe, MD as Consulting Physician (Urology) Gatha Mayer, MD as Consulting Physician (Gastroenterology) Garner Nash, DO as Consulting Physician (Pulmonary Disease) Chrystine Oiler, MD as Consulting Physician (Ophthalmology) Edythe Clarity, [facility] (Pharmacist)  Objective:  Lab Results  Component Value Date   CREATININE 1.17  05/12/2022   CREATININE 1.06 11/01/2021   CREATININE 1.20 05/03/2021    No results found for: "HGBA1C" Last diabetic Eye exam: No results found for: "HMDIABEYEEXA"  Last diabetic Foot exam: No results found for: "HMDIABFOOTEX"      Component Value Date/Time   CHOL 108 05/12/2022 1023   CHOL 89 (L) 09/27/2019 0810   TRIG 111.0 05/12/2022 1023   HDL 35.90 (L) 05/12/2022 1023   HDL 34 (L) 09/27/2019 0810   CHOLHDL 3 05/12/2022 1023   VLDL 22.2 05/12/2022 1023   [place] 50 05/12/2022 1023   LDLCALC 59 11/02/2020 1428       Latest Ref Rng & Units 05/12/2022   10:23 AM 11/01/2021   11:49 AM 05/03/2021   10:46 AM  Hepatic Function  Total Protein 6.0 - 8.3 g/dL 7.3  6.9  6.9   Albumin 3.5 - 5.2 g/dL 4.3  4.2  4.4   AST 0 - 37 U/L 24  23  24    ALT 0 - 53 U/L 22  25  25    Alk Phosphatase 39 - 117 U/L 80  78  76   Total Bilirubin 0.2 - 1.2 mg/dL 0.5  0.6  0.9   Bilirubin, Direct 0.0 - 0.3 mg/dL 0.1  0.1  0.1     Lab Results  Component Value Date/Time   TSH 3.72 05/12/2022 10:23 AM   TSH 2.90 11/01/2021 11:49 AM       Latest Ref Rng & Units 05/12/2022   10:23 AM 11/01/2021   11:49 AM 05/03/2021   10:46 AM  CBC  WBC 4.0 - 10.5 K/uL  7.0  9.5  7.5   Hemoglobin 13.0 - 17.0 g/dL 13.6  13.4  14.0   Hematocrit 39.0 - 52.0 % 40.4  40.7  42.0   Platelets 150.0 - 400.0 K/uL 201.0  171.0  169.0     No results found for: "VD25OH"  Social History   Tobacco Use  Smoking Status Former   Types: Cigars   Quit date: 05/13/1961   Years since quitting: 61.1   Passive exposure: Never  Smokeless Tobacco Never  Tobacco Comments   "never inhaled" in the early 60's    BP Readings from Last 3 Encounters:  05/12/22 (!) 128/58  04/14/22 130/64  04/07/22 (!) 142/56   Pulse Readings from Last 3 Encounters:  05/12/22 (!) 53  04/14/22 84  04/07/22 61   Wt Readings from Last 3 Encounters:  05/12/22 145 lb 8 oz (66 kg)  04/14/22 145 lb (65.8 kg)  04/07/22 145 lb (65.8 kg)    Assessment:  Review of patient past medical history, allergies, medications, health status, including review of consultants reports, laboratory and other test data, was performed as part of comprehensive evaluation and provision of chronic care management services.   SDOH:  (Social Determinants of Health) assessments and interventions performed: No done within the last year  Financial Resource Strain: Low Risk  (05/08/2022)   Overall Financial Resource Strain (CARDIA)    Difficulty of Paying Living Expenses: Not hard at all     [place]  Allergies  Allergen Reactions   Neosporin [Bacitracin-Polymyxin B] Other (See Comments)    Redness    Medications Reviewed Today     Reviewed by Edythe Clarity, [facility] (Pharmacist) on 06/18/22 at [place] List Status: <None>   Medication Order Taking? Sig Documenting Provider Last Dose Status Informant  acetaminophen (TYLENOL) 500 MG tablet [id number] Yes Take 500 mg by mouth 2 (two) times daily. [provider] Taking Active Spouse/Significant Other  albuterol (PROVENTIL) (2.5 MG/3ML) 0.083% nebulizer solution [id number] Yes Take 3 mLs (2.5 mg total) by nebulization every 6 (six) hours as needed for wheezing or shortness of breath. Martyn Ehrich, NP Taking Active   albuterol (VENTOLIN HFA) 108 (90 Base) MCG/ACT inhaler [id number] Yes INHALE 2 PUFFS INTO LUNGS EVERY 6 HOURS AS NEEDED FOR WHEEZING OR SHORTNESS OF BREATH. Martyn Ehrich, NP Taking Active   amLODipine (NORVASC) 5 MG tablet [id number] Yes Take 1 tablet (5 mg total) by mouth daily. Sueanne Margarita, MD Taking Active   aspirin EC 81 MG tablet [id number] Yes Take 81 mg by mouth daily. [provider] Taking Active Spouse/Significant Other  atorvastatin (LIPITOR) 80 MG tablet [id number] Yes Take 1 tablet (80 mg total) by mouth daily. Sueanne Margarita, MD Taking Active   azelastine (ASTELIN) 0.1 % nasal spray [id number] Yes Place 1 spray into both nostrils 2 (two) times daily. Garner Nash, DO Taking Active   carvedilol (COREG) 6.25 MG tablet [id number] Yes Take 1 tablet (6.25 mg total) by mouth 2 (two) times daily with a meal. Turner, Eber Hong, MD Taking Active   cholecalciferol (VITAMIN D3) 25 MCG (1000 UT) tablet [id number] Yes Take 1,000 Units by mouth daily. [provider] Taking Active   donepezil (ARICEPT) 10 MG tablet [id number] Yes Take 1 tablet (10 mg total) by mouth daily. Midge Minium, MD Taking Active   Eyelid Cleansers (OCUSOFT EYELID CLEANSING) PADS [id number] Yes Place 1 application into both eyes 2 (two) times daily. [provider] Taking Active  Spouse/Significant Other  fish oil-omega-3 fatty acids 1000 MG capsule [id number] Yes Take 2 capsules (2 g total) by mouth 2 (two) times daily. Sueanne Margarita, MD Taking Active   fluticasone [facility]) 50 MCG/ACT nasal spray [id number] Yes 1 spray each nostril twice daily Martyn Ehrich, NP Taking Active   fluticasone furoate-vilanterol (BREO ELLIPTA) 100-25 MCG/ACT AEPB [id number] Yes Inhale 1 puff into the lungs daily. Garner Nash, DO Taking Active   fluticasone furoate-vilanterol (BREO ELLIPTA) 100-25 MCG/INH AEPB [id number] Yes Inhale 1 puff into the lungs daily. Martyn Ehrich, NP Taking Active   fluticasone furoate-vilanterol (BREO ELLIPTA) 100-25 MCG/INH AEPB [id number] Yes INHALE 1 PUFF INTO THE LUNGS DAILY Icard, Octavio Graves, DO Taking Active   gabapentin (NEURONTIN) 100 MG capsule [id number] Yes Take 2 capsules (200 mg total) by mouth at bedtime. Lyndal Pulley, DO Taking Active   gabapentin (NEURONTIN) 300 MG capsule [id number] Yes TAKE 1 CAPSULE(300 MG) BY MOUTH THREE TIMES DAILY Lyndal Pulley, DO Taking Active   hydrochlorothiazide (HYDRODIURIL) 25 MG tablet [id number] Yes Take 1 tablet (25 mg total) by mouth daily. Sueanne Margarita, MD Taking Active   montelukast (SINGULAIR) 10 MG tablet [id number] Yes TAKE 1 TABLET BY MOUTH AT BEDTIME. GENERIC EQUIVALENT FOR SINGULAIR. Martyn Ehrich, NP Taking Active   ofloxacin (OCUFLOX) 0.3 % ophthalmic solution [id number] Yes INSTILL 1 DROP IN LEFT EYE FOUR TIMES DAILY FOR 7 DAYS BEGINNING AFTER SURGERY [provider] Taking Active   Polyvinyl Alcohol-Povidone (REFRESH OP) [id number] Yes Place 1 drop into both eyes 2 (two) times daily. [provider] Taking Active Spouse/Significant Other  Saw [place] 500 MG CAPS [id number] Yes Take 1,000 mg by mouth 2 (two) times daily.  [provider] Taking Active Spouse/Significant Other            Patient Active Problem List   Diagnosis Date Noted   Calcific tendonitis of right lower leg 12/23/2021   Right hip pain 09/16/2021   Conductive hearing loss of right ear with unrestricted hearing of left ear 03/19/2021   Piriformis syndrome of right side 08/07/2020   Chronic maxillary sinusitis 08/03/2020   Hamstring tendinitis of left thigh 03/20/2020   Nasal polyps 06/07/2019   Mild asthma 11/15/2018   Rhinitis, chronic 11/15/2018   History of ETT 10/23/2018   Globus sensation 07/29/2018   Preoperative cardiovascular examination 04/16/2018   Arthropathy of right sacroiliac joint 04/08/2016   Spinal stenosis, lumbar region, with neurogenic claudication 03/26/2016   Sciatica of right side 06/13/2015   CAD (coronary artery disease)    Rotator cuff syndrome of left shoulder 01/27/2013   Carotid artery stenosis 09/02/2012   Physical exam, annual 06/23/2012   Chronic cough 11/23/2009   SKIN LESIONS, MULTIPLE 06/05/2009   SYNDROME, 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   DISEASE, PERIPHERAL VASCULAR NOS 08/02/2007   VERTIGO 08/02/2007   Mild cognitive impairment 04/27/2007   Hyperlipidemia 03/17/2007   Essential hypertension 03/17/2007    Immunization History  Administered Date(s) Administered   Fluad Quad(high Dose 65+) 06/24/2019   Influenza Split 08/25/2011, 08/09/2012   Influenza Whole 07/11/2010   Influenza, High Dose Seasonal PF 08/14/2014,  07/16/2018   Influenza,inj,Quad PF,6+ Mos 08/11/2013, 08/18/2014, 07/11/2015, 07/14/2016, 07/20/2017   Influenza-Unspecified 07/24/2000   PFIZER(Purple Top)SARS-COV-2 Vaccination 11/15/2019, 12/06/2019, 07/27/2020, 03/08/2021   Pneumococcal Conjugate-13 01/25/2015   Pneumococcal Polysaccharide-23 09/15/2002   Td 12/13/2007, 05/31/2019   Zoster Recombinat (Shingrix) 05/29/2020, 08/14/2020   Zoster, Live 10/29/2010    Conditions to be addressed/monitored: HLD, CAD (  PCI of RCA), HTN, mild asthma   Care Plan : ccm pharmacy care plan  Updates made by Edythe Clarity, RPH since 06/18/2022 12:00 AM     Problem: HLD, CAD (PCI of RCA), HTN, mild asthma   Priority: High     Long-Range Goal: Patient-Specific Goal   Start Date: 05/29/2021  Expected End Date: 05/29/2022  Recent Progress: On track  Priority: High  Note:   Current Barriers:  Cost of current Breo inhaler/possible elevated BP  Pharmacist Clinical Goal(s):  Patient will verbalize ability to afford treatment regimen contact provider office for questions/concerns as evidenced notation of same in electronic health record through collaboration with PharmD and provider.   Interventions: 1:1 collaboration with Midge Minium, MD regarding development and update of comprehensive plan of care as evidenced by provider attestation and co-signature Inter-disciplinary care team collaboration (see longitudinal plan of care) Comprehensive medication review performed; medication list updated in electronic medical record  Hypertension  (Status:New goal.)   Med Management Intervention: no changes  (BP goal <130/80) 06/18/22 -Controlled -Current treatment: HCTZ 25 mg once daily Appropriate, Effective, Safe, Accessible Carvedilol 6.25 mg twice daily Appropriate, Effective, Safe, Accessible Amlodipine 5 mg once dailyAppropriate, Effective, Safe, Accessible -Current home readings: no logs discussed today, patient not at home - reports  they are all normal -Current exercise habits: exercise very limited due to back pain  -Denies hypotensive/hypertensive symptoms -Educated on Symptoms of hypotension and importance of maintaining adequate hydration; -Counseled to monitor BP at home as directed, document, and provide log at future appointments -Counseled on diet and exercise extensively Recommended to continue current medication -BP controlled last time in office.  Update 12/11/21 Home BP readings recently - 139/63, 139/57, 149/60 Recent office BP reading elevated 409W systolic. He denies any symptoms at home of elevated BP. Have asked him to continue to monitor and record.  If BP continues to be elevated Could consider increased dose of amlodipine to 29m daily. Will FU in two weeks on BP readings. Patient to call if they are consistently elevated in the 1119Jsystolic.  Hyperlipidemia: (LDL goal <100 -Controlled -Current treatment: Atorvastatin 80 mg once daily -Medications previously tried: Zetia, Lovaza  -Educated on Cholesterol goals;  -Recommended to continue current medication  Asthma (Goal: control symptoms and prevent exacerbations) 06/18/22 -Controlled -Concern with 90+ dollar copay while in donut hole, causing him to miss doses -Current treatment  Breo Ellipta 100-25 mcg/act 1 puff daily Appropriate, Effective, Safe, Accessible Ventolin - 2 puffs every 6 hours as needed for SOB Appropriate, Effective, Safe, Accessible -Medications previously tried: n/a  -Patient denies consistent use of maintenance inhaler -Counseled on Proper inhaler technique; Benefits of consistent maintenance inhaler use -Recommended to continue current medication Assessed patient finances. Does not qualify for PAP due to income. He uses his rescue inhaler almost daily while using Breo almost as directed but he does admit to missing doses so that he does not enter into the donut hole. Unfortunately he does not qualify for any  assistance.  BMemory Danceis a preferred product on his insurance.  Encouraged him to try and use daily if he can. Will continue to look for alternatives to allow him to use as maintenance inhaler.  He denies any increased symptoms of asthma at this time.  Update 12/11/21 He reports using his albuterol inhaler about once daily in the evening.  Discussed importance of using maintenance inhaler daily and albuterol as rescue.  Reports copay for BMemory Danceremains the same - he is unsure  of what will happen this year as he got new insurance. Encouraged him to contact us if copay is any different and we can look for alternatives that may be better. No changes to medications at this time - continue to follow for adherence and copay assistance. Provided direct line for contact regarding medication questions.  Patient Goals/Self-Care Activities Patient will:  - take medications as prescribed collaborate with provider on medication access solutions        Compliance/Adherence/Medication fill history: Care Gaps: None  Star-Rating Drugs: Atorvastatin 27m 03/31/22 90ds  Follow Up Plan:  PharmD FU in 6 months  Medication Assistance:  TBD  Patient's preferred pharmacy is:  AOccupational hygienist(MElizabeth WPecos AMcMillinASumner8[address]248[address]Phone: [phone]: [phone] [phone]- [address]AT SNew Iber[facility] Surgery [facility] LLCO[facility] [address] [address]Phone: [phone]: [phone]  Follow Up:  Patient agrees to Care Plan and Follow-up.  Future Appointments  Date Time Provider DWayne 07/21/2022 10:30 AM SLyndal Pulley DO [facility] None  11/17/2022 10:20 AM TMidge Minium MD [facility]

## 2022-06-11 ENCOUNTER — Telehealth: Payer: Medicare Other

## 2022-06-18 ENCOUNTER — Ambulatory Visit: Payer: Medicare Other | Admitting: Pharmacist

## 2022-06-18 DIAGNOSIS — I10 Essential (primary) hypertension: Secondary | ICD-10-CM

## 2022-06-18 DIAGNOSIS — J45909 Unspecified asthma, uncomplicated: Secondary | ICD-10-CM

## 2022-06-18 NOTE — Patient Instructions (Addendum)
Visit Information   Goals Addressed             This Visit's Progress    Track and Manage My Blood Pressure-Hypertension   On track    Timeframe:  Long-Range Goal Priority:  High Start Date:  12/11/21                            Expected End Date:  06/10/22                     Follow Up Date 03/10/22    - check blood pressure 3 times per week - choose a place to take my blood pressure (home, clinic or office, retail store) - write blood pressure results in a log or diary    Why is this important?   You won't feel high blood pressure, but it can still hurt your blood vessels.  High blood pressure can cause heart or kidney problems. It can also cause a stroke.  Making lifestyle changes like losing a little weight or eating less salt will help.  Checking your blood pressure at home and at different times of the day can help to control blood pressure.  If the doctor prescribes medicine remember to take it the way the doctor ordered.  Call the office if you cannot afford the medicine or if there are questions about it.     Notes:        Patient Care Plan: [facility] pharmacy care plan     Problem Identified: HLD, CAD (PCI of RCA), HTN, mild asthma   Priority: High     Long-Range Goal: Patient-Specific Goal   Start Date: 05/29/2021  Expected End Date: 05/29/2022  Recent Progress: On track  Priority: High  Note:   Current Barriers:  Cost of current Breo inhaler/possible elevated BP  Pharmacist Clinical Goal(s):  Patient will verbalize ability to afford treatment regimen contact provider office for questions/concerns as evidenced notation of same in electronic health record through collaboration with PharmD and provider.   Interventions: 1:1 collaboration with Sheliah Hatch, MD regarding development and update of comprehensive plan of care as evidenced by provider attestation and co-signature Inter-disciplinary care team collaboration (see longitudinal plan of  care) Comprehensive medication review performed; medication list updated in electronic medical record  Hypertension  (Status:New goal.)   Med Management Intervention: no changes  (BP goal <130/80) 06/18/22 -Controlled -Current treatment: HCTZ 25 mg once daily Appropriate, Effective, Safe, Accessible Carvedilol 6.25 mg twice daily Appropriate, Effective, Safe, Accessible Amlodipine 5 mg once dailyAppropriate, Effective, Safe, Accessible -Current home readings: no logs discussed today, patient not at home - reports they are all normal -Current exercise habits: exercise very limited due to back pain  -Denies hypotensive/hypertensive symptoms -Educated on Symptoms of hypotension and importance of maintaining adequate hydration; -Counseled to monitor BP at home as directed, document, and provide log at future appointments -Counseled on diet and exercise extensively Recommended to continue current medication -BP controlled last time in office.  Update 12/11/21 Home BP readings recently - 139/63, 139/57, 149/60 Recent office BP reading elevated 150s systolic. He denies any symptoms at home of elevated BP. Have asked him to continue to monitor and record.  If BP continues to be elevated Could consider increased dose of amlodipine to 10mg  daily. Will FU in two weeks on BP readings. Patient to call if they are consistently elevated in the 140s systolic.  Hyperlipidemia: (LDL goal <100 -  Controlled -Current treatment: Atorvastatin 80 mg once daily -Medications previously tried: Zetia, Lovaza  -Educated on Cholesterol goals;  -Recommended to continue current medication  Asthma (Goal: control symptoms and prevent exacerbations) 06/18/22 -Controlled -Concern with 90+ dollar copay while in donut hole, causing him to miss doses -Current treatment  Breo Ellipta 100-25 mcg/act 1 puff daily Appropriate, Effective, Safe, Accessible Ventolin - 2 puffs every 6 hours as needed for SOB Appropriate,  Effective, Safe, Accessible -Medications previously tried: n/a  -Patient denies consistent use of maintenance inhaler -Counseled on Proper inhaler technique; Benefits of consistent maintenance inhaler use -Recommended to continue current medication Assessed patient finances. Does not qualify for PAP due to income. He uses his rescue inhaler almost daily while using Breo almost as directed but he does admit to missing doses so that he does not enter into the donut hole. Unfortunately he does not qualify for any assistance.  Virgel Bouquet is a preferred product on his insurance.  Encouraged him to try and use daily if he can. Will continue to look for alternatives to allow him to use as maintenance inhaler.  He denies any increased symptoms of asthma at this time.  Update 12/11/21 He reports using his albuterol inhaler about once daily in the evening.  Discussed importance of using maintenance inhaler daily and albuterol as rescue.  Reports copay for Virgel Bouquet remains the same - he is unsure of what will happen this year as he got new insurance. Encouraged him to contact us if copay is any different and we can look for alternatives that may be better. No changes to medications at this time - continue to follow for adherence and copay assistance. Provided direct line for contact regarding medication questions.  Patient Goals/Self-Care Activities Patient will:  - take medications as prescribed collaborate with provider on medication access solutions          The patient verbalized understanding of instructions, educational materials, and care plan provided today and DECLINED offer to receive copy of patient instructions, educational materials, and care plan.  Telephone follow up appointment with pharmacy team member scheduled for: 6 months  Erroll Luna, [facility]  Willa Frater, PharmD Clinical Pharmacist  [facility] [phone]

## 2022-06-20 ENCOUNTER — Other Ambulatory Visit: Payer: Self-pay | Admitting: Family Medicine

## 2022-06-20 DIAGNOSIS — G3184 Mild cognitive impairment, so stated: Secondary | ICD-10-CM

## 2022-07-11 ENCOUNTER — Encounter (HOSPITAL_BASED_OUTPATIENT_CLINIC_OR_DEPARTMENT_OTHER): Payer: Medicare Other | Attending: General Surgery | Admitting: General Surgery

## 2022-07-11 DIAGNOSIS — I10 Essential (primary) hypertension: Secondary | ICD-10-CM | POA: Diagnosis not present

## 2022-07-11 DIAGNOSIS — L97512 Non-pressure chronic ulcer of other part of right foot with fat layer exposed: Secondary | ICD-10-CM | POA: Insufficient documentation

## 2022-07-11 DIAGNOSIS — I2510 Atherosclerotic heart disease of native coronary artery without angina pectoris: Secondary | ICD-10-CM | POA: Insufficient documentation

## 2022-07-11 DIAGNOSIS — I739 Peripheral vascular disease, unspecified: Secondary | ICD-10-CM | POA: Insufficient documentation

## 2022-07-11 NOTE — Progress Notes (Addendum)
[name], [name] ([id number]) Visit Report for 07/11/2022 Chief Complaint Document Details Patient Name: Date of Service: [name], [name] CK 07/11/2022 10:30 A M Medical Record Number: [id number] Patient Account Number: [id number] Date of Birth/Sex: Treating RN: 1935/08/05 (87 y.o. Male) Primary Care Provider: Neena Rhymes Other Clinician: Referring Provider: Treating Provider/Extender: Brendia Sacks in Treatment: 0 Information Obtained from: Patient Chief Complaint Patient seen for complaints of Non-Healing Wound. Electronic Signature(s) Signed: 07/11/2022 11:39:54 AM By: Duanne Guess MD FACS Entered By: Duanne Guess on 07/11/2022 11:39:53 -------------------------------------------------------------------------------- Debridement Details Patient Name: Date of Service: [name] CK 07/11/2022 10:30 A M Medical Record Number: [id number] Patient Account Number: [id number] Date of Birth/Sex: Treating RN: 04-06-1935 (87 y.o. Male) [name] Primary Care Provider: Neena Rhymes Other Clinician: Referring Provider: Treating Provider/Extender: Brendia Sacks in Treatment: 0 Debridement Performed for Assessment: Wound #1 Medial T Second oe Performed By: Physician Duanne Guess, MD Debridement Type: Debridement Level of Consciousness (Pre-procedure): Awake and Alert Pre-procedure Verification/Time Out Yes - 11:14 Taken: Start Time: 11:16 Pain Control: Lidocaine 5% topical ointment T Area Debrided (L x W): otal 0.5 (cm) x 0.5 (cm) = 0.25 (cm) Tissue and other material debrided: Non-Viable, Slough, Slough Level: Non-Viable Tissue Debridement Description: Selective/Open Wound Instrument: Curette Bleeding: Minimum Hemostasis Achieved: Pressure Procedural Pain: 0 Post Procedural Pain: 0 Response to Treatment: Procedure was tolerated well Level of Consciousness (Post- Awake and Alert procedure): Post Debridement  Measurements of Total Wound Length: (cm) 0.5 Width: (cm) 0.5 Depth: (cm) 0.1 Volume: (cm) 0.02 Character of Wound/Ulcer Post Debridement: Requires Further Debridement Post Procedure Diagnosis Same as Pre-procedure Notes Scribed for Dr. Lady Gary by Robert Ard, RN Electronic Signature(s) Signed: 07/11/2022 12:23:28 PM By: Duanne Guess MD FACS Signed: 07/11/2022 5:03:05 PM By: Robert Ard RN Entered By: [name] on 07/11/2022 11:18:52 -------------------------------------------------------------------------------- HPI Details Patient Name: Date of Service: [name] CK 07/11/2022 10:30 A M Medical Record Number: [id number] Patient Account Number: [id number] Date of Birth/Sex: Treating RN: 1935-10-11 (87 y.o. Male) Primary Care Provider: Neena Rhymes Other Clinician: Referring Provider: Treating Provider/Extender: Brendia Sacks in Treatment: 0 History of Present Illness HPI Description: ADMISSION 07/11/2022 This is an 87 year old man with a history of coronary artery disease, hypertension, asthma, and peripheral vascular disease. He has a bunion on the medial aspect of his right great toe at the interphalangeal joint. He has had multiple issues in the past where the bunion has rubbed his second toe causing an ulcer. He has seen podiatry in the past and although surgery was mentioned, it was never apparently offered. He has been wearing a silicone toe separator that has subsequently resulted in an abrasion between his first and second toes in the webbing. They have used different kinds of padding to try and prevent the ulceration. They have been able to get these to heal in the past, but this currently has been present for about longer than usual. About 2 weeks ago, the patient and his wife reports that his foot was extremely red and swollen. They went to an urgent care and he was prescribed a 10-day course of doxycycline which he has since  completed. He is not a diabetic and does not currently smoke. ABI in clinic today was 0.94. On exam, there is a circular ulcer on the medial aspect of his right second toe which corresponds directly to the point of pressure from the bunion on his great toe. There is some fibrinous slough on the wound surface. No concern for  infection at this time. Electronic Signature(s) Signed: 07/11/2022 11:46:37 AM By: Duanne Guess MD FACS Entered By: Duanne Guess on 07/11/2022 11:46:37 -------------------------------------------------------------------------------- Physical Exam Details Patient Name: Date of Service: [name] CK 07/11/2022 10:30 A M Medical Record Number: [id number] Patient Account Number: [id number] Date of Birth/Sex: Treating RN: July 11, 1935 (87 y.o. Male) Primary Care Provider: Neena Rhymes Other Clinician: Referring Provider: Treating Provider/Extender: Brendia Sacks in Treatment: 0 Constitutional . . . . No acute distress. Respiratory Normal work of breathing on room air.. Cardiovascular .Marland Kitchen Notes 07/11/2022: On exam, there is a circular ulcer on the medial aspect of his right second toe which corresponds directly to the point of pressure from the bunion on his great toe. There is some fibrinous slough on the wound surface. No concern for infection at this time. Electronic Signature(s) Signed: 07/11/2022 11:48:03 AM By: Duanne Guess MD FACS Entered By: Duanne Guess on 07/11/2022 11:48:03 -------------------------------------------------------------------------------- Physician Orders Details Patient Name: Date of Service: [name] CK 07/11/2022 10:30 A M Medical Record Number: [id number] Patient Account Number: [id number] Date of Birth/Sex: Treating RN: July 12, 1935 (87 y.o. Male) [name] Primary Care Provider: Neena Rhymes Other Clinician: Referring Provider: Treating Provider/Extender: Brendia Sacks in Treatment: 0 Verbal / Phone Orders: No Diagnosis Coding ICD-10 Coding Code Description [phone] Non-pressure chronic ulcer of other part of right foot with fat layer exposed I10 Essential (primary) hypertension I25.10 Atherosclerotic heart disease of native coronary artery without angina pectoris I73.9 Peripheral vascular disease, unspecified Follow-up Appointments ppointment in 1 week. - Dr. Lady Gary Rm 4 Return A Anesthetic (In clinic) Topical Lidocaine 5% applied to wound bed - In clinic, prior to debridement Bathing/ Shower/ Hygiene May shower and wash wound with soap and water. - on days when dressing is changed Wound Treatment Wound #1 - T Second oe Wound Laterality: Medial Cleanser: Soap and Water 1 x Per Day/30 Days Discharge Instructions: May shower and wash wound with dial antibacterial soap and water prior to dressing change. Peri-Wound Care: Sween Lotion (Moisturizing lotion) 1 x Per Day/30 Days Discharge Instructions: Apply moisturizing lotion as directed Prim Dressing: KerraCel Ag Gelling Fiber Dressing, 2x2 in (silver alginate) 1 x Per Day/30 Days ary Discharge Instructions: Apply silver alginate to wound bed as instructed Secondary Dressing: Optifoam Non-Adhesive Dressing, 4x4 in (DME) (Generic) 1 x Per Day/30 Days Discharge Instructions: Apply over primary dressing as directed. Secured With: [employer], 4.5x3.1 (in/yd) (DME) (Generic) 1 x Per Day/30 Days Discharge Instructions: Secure with Kerlix as directed. Secured With: 8M Medipore Scientist, research (life sciences) Surgical T 2x10 (in/yd) (DME) (Generic) 1 x Per Day/30 Days ape Discharge Instructions: Secure with tape as directed. Consults Podiatry- [facility] (Dr. Allena Katz) - Non-healing ulcer on Rt second toe Electronic Signature(s) Signed: 07/11/2022 12:23:28 PM By: Duanne Guess MD FACS Signed: 07/11/2022 5:03:05 PM By: Robert Ard RN Previous Signature: 07/11/2022 11:48:18 AM Version By:  Duanne Guess MD FACS Entered By: [name] on 07/11/2022 11:55:20 Prescription 07/11/2022 -------------------------------------------------------------------------------- Shona Needles MD Patient Name: Provider: 10-21-1935 [id number] Date of Birth: NPI#: Male [id number] Sex: DEA #: [phone] Phone #: License #: Eligha Bridegroom [facility] Patient Address: [address] [place], [place] [address] [address] [place], [place] [address] [phone] Allergies Provider's Orders Podiatry- [facility] (Dr. Allena Katz) - Non-healing ulcer on Rt second toe Hand Signature: Date(s): Electronic Signature(s) Signed: 07/11/2022 12:23:28 PM By: Duanne Guess MD FACS Signed: 07/11/2022 5:03:05 PM  By: Robert ArdZochol, Jamie RN Entered By: Robert ArdZochol, [name] on 07/11/2022 11:55:20 -------------------------------------------------------------------------------- Problem List Details Patient Name: Date of Service: Robert ClinesWILKIE, JA CK 07/11/2022 10:30 A M Medical Record Number: [id number] Patient Account Number: [id number] Date of Birth/Sex: Treating RN: 16-Mar-1935 (87 y.o. Male) Primary Care Provider: Neena Rhymesabori, Katherine Other Clinician: Referring Provider: Treating Provider/Extender: Brendia Sacksannon, Cyanne Delmar McDonald, Margaret Weeks in Treatment: 0 Active Problems ICD-10 Encounter Code Description Active Date MDM Diagnosis [phone] Non-pressure chronic ulcer of other part of right foot with fat layer exposed 07/11/2022 No Yes I10 Essential (primary) hypertension 07/11/2022 No Yes I25.10 Atherosclerotic heart disease of native coronary artery without angina pectoris 07/11/2022 No Yes I73.9 Peripheral vascular disease, unspecified 07/11/2022 No Yes Inactive Problems Resolved Problems Electronic Signature(s) Signed: 07/11/2022 11:39:35 AM By: Duanne Guessannon, Chavis Tessler MD FACS Previous Signature: 07/11/2022 10:54:07 AM Version By: Duanne Guessannon, Skylan Gift MD  FACS Previous Signature: 07/11/2022 10:50:54 AM Version By: Duanne Guessannon, Bary Limbach MD FACS Entered By: Duanne Guessannon, Saban Heinlen on 07/11/2022 11:39:35 -------------------------------------------------------------------------------- Progress Note Details Patient Name: Date of Service: Robert ClinesWILKIE, JA CK 07/11/2022 10:30 A M Medical Record Number: [id number] Patient Account Number: [id number] Date of Birth/Sex: Treating RN: 16-Mar-1935 (87 y.o. Male) Primary Care Provider: Neena Rhymesabori, Katherine Other Clinician: Referring Provider: Treating Provider/Extender: Brendia Sacksannon, Suellyn Meenan McDonald, Margaret Weeks in Treatment: 0 Subjective Chief Complaint Information obtained from Patient Patient seen for complaints of Non-Healing Wound. History of Present Illness (HPI) ADMISSION 07/11/2022 This is an 87 year old man with a history of coronary artery disease, hypertension, asthma, and peripheral vascular disease. He has a bunion on the medial aspect of his right great toe at the interphalangeal joint. He has had multiple issues in the past where the bunion has rubbed his second toe causing an ulcer. He has seen podiatry in the past and although surgery was mentioned, it was never apparently offered. He has been wearing a silicone toe separator that has subsequently resulted in an abrasion between his first and second toes in the webbing. They have used different kinds of padding to try and prevent the ulceration. They have been able to get these to heal in the past, but this currently has been present for about longer than usual. About 2 weeks ago, the patient and his wife reports that his foot was extremely red and swollen. They went to an urgent care and he was prescribed a 10-day course of doxycycline which he has since completed. He is not a diabetic and does not currently smoke. ABI in clinic today was 0.94. On exam, there is a circular ulcer on the medial aspect of his right second toe which corresponds directly to the point  of pressure from the bunion on his great toe. There is some fibrinous slough on the wound surface. No concern for infection at this time. Patient History Information obtained from Patient. Allergies No allergies have been documented for the patient General Notes: No known allergies Family History Heart Disease - Mother,Siblings, Hypertension - Mother,Father, Stroke - Father,Mother, No family history of Cancer, Diabetes, Kidney Disease, Lung Disease, Seizures, Thyroid Problems, Tuberculosis. Social History Former smoker, Marital Status - Married, Alcohol Use - Moderate, Drug Use - No History, Caffeine Use - Daily. Medical History Eyes Patient has history of Cataracts - 2021 Denies history of Glaucoma Respiratory Patient has history of Asthma Cardiovascular Patient has history of Coronary Artery Disease, Hypertension Denies history of Myocardial Infarction, Peripheral Arterial Disease Endocrine Denies history of Type I Diabetes, Type II Diabetes Integumentary (Skin) Denies history of History of Burn Musculoskeletal Denies history of Gout, Rheumatoid Arthritis, Osteoarthritis,  Osteomyelitis Neurologic Denies history of Dementia, Neuropathy, Quadriplegia, Paraplegia, Seizure Disorder Oncologic Denies history of Received Chemotherapy, Received Radiation Psychiatric Denies history of Anorexia/bulimia Review of Systems (ROS) Constitutional Symptoms (General Health) Denies complaints or symptoms of Fatigue, Fever, Chills, Marked Weight Change. Eyes Denies complaints or symptoms of Dry Eyes. Respiratory Denies complaints or symptoms of Shortness of Breath. Cardiovascular Denies complaints or symptoms of Chest pain. Gastrointestinal Denies complaints or symptoms of Frequent diarrhea, Nausea, Vomiting. Integumentary (Skin) Complains or has symptoms of Wounds, right ft 2nd toe Musculoskeletal Denies complaints or symptoms of Muscle Pain, Muscle Weakness. Neurologic Denies  complaints or symptoms of Numbness/parasthesias. Psychiatric Denies complaints or symptoms of Claustrophobia, Suicidal. Objective Constitutional No acute distress. Vitals Time Taken: 10:25 AM, Height: 66 in, Source: Stated, Weight: 145 lbs, Source: Stated, BMI: 23.4, Temperature: 97.8 F, Pulse: 66 bpm, Respiratory Rate: 16 breaths/min, Blood Pressure: 127/61 mmHg. Respiratory Normal work of breathing on room air.. General Notes: 07/11/2022: On exam, there is a circular ulcer on the medial aspect of his right second toe which corresponds directly to the point of pressure from the bunion on his great toe. There is some fibrinous slough on the wound surface. No concern for infection at this time. Integumentary (Hair, Skin) Wound #1 status is Open. Original cause of wound was Gradually Appeared. The date acquired was: 01/25/2022. The wound is located on the Medial T Second. oe The wound measures 0.5cm length x 0.6cm width x 0.1cm depth; 0.236cm^2 area and 0.024cm^3 volume. There is Fat Layer (Subcutaneous Tissue) exposed. There is no tunneling noted, however, there is undermining starting at 9:00 and ending at 1:00. There is a medium amount of serosanguineous drainage noted. There is large (67-100%) granulation within the wound bed. There is a small (1-33%) amount of necrotic tissue within the wound bed including Adherent Slough. Assessment Active Problems ICD-10 Non-pressure chronic ulcer of other part of right foot with fat layer exposed Essential (primary) hypertension Atherosclerotic heart disease of native coronary artery without angina pectoris Peripheral vascular disease, unspecified Procedures Wound #1 Pre-procedure diagnosis of Wound #1 is a T be determined located on the Medial T Second . There was a Selective/Open Wound Non-Viable Tissue o oe Debridement with a total area of 0.25 sq cm performed by Duanne Guess, MD. With the following instrument(s): Curette to remove  Non-Viable tissue/material. Material removed includes [facility] after achieving pain control using Lidocaine 5% topical ointment. No specimens were taken. A time out was conducted at 11:14, prior to the start of the procedure. A Minimum amount of bleeding was controlled with Pressure. The procedure was tolerated well with a pain level of 0 throughout and a pain level of 0 following the procedure. Post Debridement Measurements: 0.5cm length x 0.5cm width x 0.1cm depth; 0.02cm^3 volume. Character of Wound/Ulcer Post Debridement requires further debridement. Post procedure Diagnosis Wound #1: Same as Pre-Procedure General Notes: Scribed for Dr. Lady Gary by Robert Ard, RN. Plan Follow-up Appointments: Return Appointment in 1 week. - Dr. Lady Gary Rm 4 Anesthetic: (In clinic) Topical Lidocaine 5% applied to wound bed - In clinic, prior to debridement Bathing/ Shower/ Hygiene: May shower and wash wound with soap and water. - on days when dressing is changed Consults ordered were: Podiatry- [facility] (Dr. Allena Katz) - Non-healing ulcer on Rt second toe WOUND #1: - T Second Wound Laterality: Medial oe Cleanser: Soap and Water 1 x Per Day/30 Days Discharge Instructions: May shower and wash wound with dial antibacterial soap and water prior to dressing change.  Peri-Wound Care: Sween Lotion (Moisturizing lotion) 1 x Per Day/30 Days Discharge Instructions: Apply moisturizing lotion as directed Prim Dressing: KerraCel Ag Gelling Fiber Dressing, 2x2 in (silver alginate) 1 x Per Day/30 Days ary Discharge Instructions: Apply silver alginate to wound bed as instructed Secondary Dressing: Optifoam Non-Adhesive Dressing, 4x4 in (DME) (Generic) 1 x Per Day/30 Days Discharge Instructions: Apply over primary dressing as directed. Secured With: [employer], 4.5x3.1 (in/yd) (DME) (Generic) 1 x Per Day/30 Days Discharge Instructions: Secure with Kerlix as directed. Secured With: 67M Medipore Scientist, research (life sciences)  Surgical T 2x10 (in/yd) (DME) (Generic) 1 x Per Day/30 Days ape Discharge Instructions: Secure with tape as directed. 07/11/2022: This is an 53 man who has had longstanding issues with a bunion on his great toe causing ulceration on his second toe. On exam, there is a circular ulcer on the medial aspect of his right second toe which corresponds directly to the point of pressure from the bunion on his great toe. There is some fibrinous slough on the wound surface. No concern for infection at this time. I used a curette to debride the slough off of the wound. We will use silver alginate with a foam doughnut padding. I think he would probably benefit from surgical intervention as this continues to occur despite use of padding interventions. We will refer him to podiatry to discuss this further. Follow-up here in 1 week. Electronic Signature(s) Signed: 07/11/2022 12:23:28 PM By: Duanne Guess MD FACS Signed: 07/11/2022 5:03:05 PM By: Robert Ard RN Previous Signature: 07/11/2022 11:52:07 AM Version By: Duanne Guess MD FACS Entered By: [name] on 07/11/2022 11:56:12 -------------------------------------------------------------------------------- HxROS Details Patient Name: Date of Service: [name] CK 07/11/2022 10:30 A M Medical Record Number: [id number] Patient Account Number: [id number] Date of Birth/Sex: Treating RN: 25-Sep-1935 (87 y.o. Male) [name] Primary Care Provider: Neena Rhymes Other Clinician: Referring Provider: Treating Provider/Extender: Brendia Sacks in Treatment: 0 Information Obtained From Patient Constitutional Symptoms (General Health) Complaints and Symptoms: Negative for: Fatigue; Fever; Chills; Marked Weight Change Eyes Complaints and Symptoms: Negative for: Dry Eyes Medical History: Positive for: Cataracts - 2021 Negative for: Glaucoma Respiratory Complaints and Symptoms: Negative for: Shortness of  Breath Medical History: Positive for: Asthma Cardiovascular Complaints and Symptoms: Negative for: Chest pain Medical History: Positive for: Coronary Artery Disease; Hypertension Negative for: Myocardial Infarction; Peripheral Arterial Disease Gastrointestinal Complaints and Symptoms: Negative for: Frequent diarrhea; Nausea; Vomiting Integumentary (Skin) Complaints and Symptoms: Positive for: Wounds Review of System Notes: right ft 2nd toe Medical History: Negative for: History of Burn Musculoskeletal Complaints and Symptoms: Negative for: Muscle Pain; Muscle Weakness Medical History: Negative for: Gout; Rheumatoid Arthritis; Osteoarthritis; Osteomyelitis Neurologic Complaints and Symptoms: Negative for: Numbness/parasthesias Medical History: Negative for: Dementia; Neuropathy; Quadriplegia; Paraplegia; Seizure Disorder Psychiatric Complaints and Symptoms: Negative for: Claustrophobia; Suicidal Medical History: Negative for: Anorexia/bulimia Endocrine Medical History: Negative for: Type I Diabetes; Type II Diabetes Oncologic Medical History: Negative for: Received Chemotherapy; Received Radiation HBO Extended History Items Eyes: Cataracts Immunizations Pneumococcal Vaccine: Received Pneumococcal Vaccination: Yes Received Pneumococcal Vaccination On or After 60th Birthday: Yes Implantable Devices No devices added Family and Social History Cancer: No; Diabetes: No; Heart Disease: Yes - Mother,Siblings; Hypertension: Yes - Mother,Father; Kidney Disease: No; Lung Disease: No; Seizures: No; Stroke: Yes - Father,Mother; Thyroid Problems: No; Tuberculosis: No; Former smoker; Marital Status - Married; Alcohol Use: Moderate; Drug Use: No History; Caffeine Use: Daily; Financial Concerns: No; Food, Clothing or Shelter Needs: No; Support System Lacking: No; Transportation Concerns:  No Physician Affirmation I have reviewed and agree with the above information. Electronic  Signature(s) Signed: 07/11/2022 12:23:28 PM By: Duanne Guess MD FACS Signed: 07/11/2022 5:03:05 PM By: Robert Ard RN Entered By: [name] on 07/11/2022 10:55:25 -------------------------------------------------------------------------------- SuperBill Details Patient Name: Date of Service: [name] CK 07/11/2022 Medical Record Number: [id number] Patient Account Number: [id number] Date of Birth/Sex: Treating RN: [birth year] (87 y.o. Male) Primary Care Provider: Neena Rhymes Other Clinician: Referring Provider: Treating Provider/Extender: Brendia Sacks in Treatment: 0 Diagnosis Coding ICD-10 Codes Code Description [phone] Non-pressure chronic ulcer of other part of right foot with fat layer exposed I10 Essential (primary) hypertension I25.10 Atherosclerotic heart disease of native coronary artery without angina pectoris I73.9 Peripheral vascular disease, unspecified Facility Procedures CPT4 Code: [id number] Description: [phone] - DEBRIDE WOUND 1ST 20 SQ CM OR < ICD-10 Diagnosis Description L97.512 Non-pressure chronic ulcer of other part of right foot with fat layer exposed Modifier: Quantity: 1 Physician Procedures : CPT4 Code Description Modifier [id number] - WC PHYS LEVEL 4 - NEW PT 25 ICD-10 Diagnosis Description L97.512 Non-pressure chronic ulcer of other part of right foot with fat layer exposed I73.9 Peripheral vascular disease, unspecified I25.10  Atherosclerotic heart disease of native coronary artery without angina pectoris I10 Essential (primary) hypertension Quantity: 1 : [phone] [id number] - WC PHYS DEBR WO ANESTH 20 SQ CM ICD-10 Diagnosis Description L97.512 Non-pressure chronic ulcer of other part of right foot with fat layer exposed Quantity: 1 Electronic Signature(s) Signed: 07/11/2022 11:52:24 AM By: Duanne Guess MD FACS Entered By: Duanne Guess on 07/11/2022 11:52:23

## 2022-07-11 NOTE — Progress Notes (Addendum)
[name], [name] ([id number]) Visit Report for 07/11/2022 Allergy List Details Patient Name: Date of Service: [name], [name] 07/11/2022 10:30 A M Medical Record Number: [id number] Patient Account Number: [id number] Date of Birth/Sex: Treating [name]: [name] (87 y.o. Male) [name] Primary Care [name]: [name] Other Clinician: Referring [name]: Treating [name]/Extender: [name] in Treatment: 0 Allergies Active Allergies Inactive Allergies Allergy Notes No known allergies Electronic Signature(s) Signed: 07/11/2022 5:03:05 PM By: Robert Ard [name] Entered By: [name] on 07/11/2022 10:48:16 -------------------------------------------------------------------------------- Arrival Information Details Patient Name: Date of Service: [name] 07/11/2022 10:30 A M Medical Record Number: [id number] Patient Account Number: [id number] Date of Birth/Sex: Treating [name]: [name] (87 y.o. Male) Primary Care [name]: [name] Other Clinician: Referring [name]: Treating [name]/Extender: [name] in Treatment: 0 Visit Information Patient Arrived: Ambulatory Arrival Time: 10:23 Accompanied By: wife Transfer Assistance: None Patient Identification Verified: Yes Secondary Verification Process Completed: Yes Electronic Signature(s) Signed: 07/11/2022 10:37:40 AM By: [name] Entered By: [name] on 07/11/2022 10:25:15 -------------------------------------------------------------------------------- Clinic Level of Care Assessment Details Patient Name: Date of Service: Robert, [name] [name] 07/11/2022 10:30 A M Medical Record Number: [id number] Patient Account Number: [id number] Date of Birth/Sex: Treating [name]: [name] (87 y.o. Male) [name] Primary Care Rickey Farrier: [name] Other Clinician: Referring Ranada Vigorito: Treating Keylen Eckenrode/Extender: [name] in Treatment: 0 Clinic Level of Care Assessment Items TOOL 1 Quantity Score X- 1 0 Use when EandM and Procedure is performed on INITIAL visit ASSESSMENTS - Nursing Assessment / Reassessment X- 1 20 General Physical Exam (combine w/ comprehensive assessment (listed just below) when performed on new pt. evals) X- 1 25 Comprehensive Assessment (HX, ROS, Risk Assessments, Wounds Hx, etc.) ASSESSMENTS - Wound and Skin Assessment / Reassessment []  - 0 Dermatologic / Skin Assessment (not related to wound area) ASSESSMENTS - Ostomy and/or Continence Assessment and Care []  - 0 Incontinence Assessment and Management []  - 0 Ostomy Care Assessment and Management (repouching, etc.) PROCESS - Coordination of Care X - Simple Patient / Family Education for ongoing care 1 15 []  - 0 Complex (extensive) Patient / Family Education for ongoing care X- 1 10 Staff obtains , Records, T Results / Process Orders est X- 1 10 Staff telephones HHA, Nursing Homes / Clarify orders / etc []  - 0 Routine Transfer to another Facility (non-emergent condition) []  - 0 Routine Hospital Admission (non-emergent condition) []  - 0 New Admissions / / Ordering NPWT Apligraf, etc. , []  - 0 Emergency Hospital Admission (emergent condition) PROCESS - Special Needs []  - 0 Pediatric / Minor Patient Management []  - 0 Isolation Patient Management []  - 0 Hearing / Language / Visual special needs []  - 0 Assessment of Community assistance (transportation, D/C planning, etc.) []  - 0 Additional assistance / Altered mentation []  - 0 Support Surface(s) Assessment (bed, cushion, seat, etc.) INTERVENTIONS - Miscellaneous []  - 0 External ear exam []  - 0 Patient Transfer (multiple staff / / Similar devices) X- 1 5 Simple Staple / Suture removal (25 or less) []  - 0 Complex Staple / Suture removal (26 or more) []  - 0 Hypo/Hyperglycemic Management (do not check if  billed separately) X- 1 15 Ankle / Brachial Index (ABI) - do not check if billed separately Has the patient been seen at the hospital within the last three years: Yes Total Score: 100 Level Of Care: New/Established - Level 3 Electronic Signature(s) Signed: 07/11/2022 5:03:05 PM By: [name] Entered By:  [name] on 07/11/2022 11:08:09 -------------------------------------------------------------------------------- Encounter Discharge Information Details Patient Name: Date of Service: [name], [name] [name] 07/11/2022 10:30 A M Medical Record Number: [id number] Patient Account Number: [id number] Date of Birth/Sex: Treating [name]: [name] (87 y.o. Male) [name] Primary Care Keeshia Sanderlin: [name] Other Clinician: Referring Rashea Hoskie: Treating Miner Koral/Extender: [name] in Treatment: 0 Encounter Discharge Information Items Post Procedure Vitals Discharge Condition: Stable Temperature (F): 97.8 Ambulatory Status: Ambulatory Pulse (bpm): 66 Discharge Destination: Home Respiratory Rate (breaths/min): 18 Transportation: Private Auto Blood Pressure (mmHg): 127/61 Accompanied By: wife Schedule Follow-up Appointment: Yes Clinical Summary of Care: Electronic Signature(s) Signed: 07/11/2022 5:03:05 PM By: Robert Ard [name] Entered By: [name] on 07/11/2022 11:24:21 -------------------------------------------------------------------------------- Lower Extremity Assessment Details Patient Name: Date of Service: [name], [name] [name] 07/11/2022 10:30 A M Medical Record Number: [id number] Patient Account Number: [id number] Date of Birth/Sex: Treating [name]: [name] (87 y.o. Male) [name] Primary Care Matthieu Loftus: [name] Other Clinician: Referring Damarien Nyman: Treating Zebadiah Willert/Extender: [name] in Treatment: 0 Edema Assessment Assessed: Kyra Searles: No] [Right: No] E[Left: dema] [Right: :] Calf Left:  Right: Point of Measurement: From Medial Instep 31 cm Ankle Left: Right: Point of Measurement: From Medial Instep 20 cm Knee To Floor Left: Right: From Medial Instep 39 cm Vascular Assessment Pulses: Dorsalis Pedis Palpable: [Right:Yes] Blood Pressure: Brachial: [Right:127] Dorsalis Pedis: 120 Ankle: Posterior Tibial: 100 Ankle Brachial Index: [Right:0.94] Electronic Signature(s) Signed: 07/11/2022 5:03:05 PM By: Robert Ard [name] Entered By: [name] on 07/11/2022 11:24:28 -------------------------------------------------------------------------------- Multi Wound Chart Details Patient Name: Date of Service: [name] 07/11/2022 10:30 A M Medical Record Number: [id number] Patient Account Number: [id number] Date of Birth/Sex: Treating [name]: [name] (87 y.o. Male) Primary Care Merrie Epler: [name] Other Clinician: Referring Layan Zalenski: Treating Kanchan Gal/Extender: [name] in Treatment: 0 Vital Signs Height(in): 66 Pulse(bpm): 66 Weight(lbs): 145 Blood Pressure(mmHg): 127/61 Body Mass Index(BMI): 23.4 Temperature(F): 97.8 Respiratory Rate(breaths/min): 16 Photos: [N/A:N/A] Medial T Second oe N/A N/A Wound Location: Gradually Appeared N/A N/A Wounding Event: T be determined o N/A N/A Primary Etiology: Cataracts, Asthma, Coronary Artery N/A N/A Comorbid History: Disease, Hypertension 01/25/2022 N/A N/A Date Acquired: 0 N/A N/A Weeks of Treatment: Open N/A N/A Wound Status: No N/A N/A Wound Recurrence: 0.5x0.6x0.1 N/A N/A Measurements L x W x D (cm) 0.236 N/A N/A A (cm) : rea 0.024 N/A N/A Volume (cm) : 0.00% N/A N/A % Reduction in A rea: 0.00% N/A N/A % Reduction in Volume: 9 Starting Position 1 (o'clock): 1 Ending Position 1 (o'clock): Yes N/A N/A Undermining: Partial Thickness N/A N/A Classification: Medium N/A N/A Exudate A mount: Serosanguineous N/A N/A Exudate Type: red, brown N/A  N/A Exudate Color: Large (67-100%) N/A N/A Granulation A mount: Small (1-33%) N/A N/A Necrotic A mount: Fat Layer (Subcutaneous Tissue): Yes N/A N/A Exposed Structures: Fascia: No Tendon: No Muscle: No Joint: No Bone: No Debridement - Selective/Open Wound N/A N/A Debridement: Pre-procedure Verification/Time Out 11:14 N/A N/A Taken: Lidocaine 5% topical ointment N/A N/A Pain Control: Slough N/A N/A Tissue Debrided: Non-Viable Tissue N/A N/A Level: 0.25 N/A N/A Debridement A (sq cm): rea Curette N/A N/A Instrument: Minimum N/A N/A Bleeding: Pressure N/A N/A Hemostasis A chieved: 0 N/A N/A Procedural Pain: 0 N/A N/A Post Procedural Pain: Procedure was tolerated well N/A N/A Debridement Treatment Response: 0.5x0.5x0.1 N/A N/A Post Debridement Measurements L x W x D (cm) 0.02 N/A N/A Post Debridement Volume: (cm) Debridement N/A N/A Procedures Performed: Treatment Notes Wound #1 (Toe Second) Wound  Laterality: Medial Cleanser Soap and Water Discharge Instruction: May shower and wash wound with dial antibacterial soap and water prior to dressing change. Peri-Wound Care Sween Lotion (Moisturizing lotion) Discharge Instruction: Apply moisturizing lotion as directed Topical Primary Dressing KerraCel Ag Gelling Fiber Dressing, 2x2 in (silver alginate) Discharge Instruction: Apply silver alginate to wound bed as instructed Secondary Dressing Optifoam Non-Adhesive Dressing, 4x4 in Discharge Instruction: Apply over primary dressing as directed. Secured With [employer], 4.5x3.1 (in/yd) Discharge Instruction: Secure with Kerlix as directed. 61M Medipore Soft Cloth Surgical T 2x10 (in/yd) ape Discharge Instruction: Secure with tape as directed. Compression Wrap Compression Stockings Add-Ons Electronic Signature(s) Signed: 07/11/2022 11:39:44 AM By: Duanne Guess MD FACS Entered By: Duanne Guess on 07/11/2022  11:39:43 -------------------------------------------------------------------------------- Multi-Disciplinary Care Plan Details Patient Name: Date of Service: [name] 07/11/2022 10:30 A M Medical Record Number: [id number] Patient Account Number: [id number] Date of Birth/Sex: Treating [name]: [name] (87 y.o. Male) [name] Primary Care Arrietty Dercole: [name] Other Clinician: Referring Shakeyla Giebler: Treating Savyon Loken/Extender: [name] in Treatment: 0 Active Inactive Orientation to the Wound Care Program Nursing Diagnoses: Knowledge deficit related to the wound healing center program Goals: Patient/caregiver will verbalize understanding of the Wound Healing Center Program Date Initiated: 07/11/2022 Target Resolution Date: 08/01/2022 Goal Status: Active Interventions: Provide education on orientation to the wound center Notes: Wound/Skin Impairment Nursing Diagnoses: Impaired tissue integrity Goals: Ulcer/skin breakdown will have a volume reduction of 30% by week 4 Date Initiated: 07/11/2022 Target Resolution Date: 08/08/2022 Goal Status: Active Interventions: Assess ulceration(s) every visit Treatment Activities: Patient referred to home care : 07/11/2022 Notes: Electronic Signature(s) Signed: 07/11/2022 5:03:05 PM By: Robert Ard [name] Entered By: [name] on 07/11/2022 11:06:49 -------------------------------------------------------------------------------- Pain Assessment Details Patient Name: Date of Service: [name] 07/11/2022 10:30 A M Medical Record Number: [id number] Patient Account Number: [id number] Date of Birth/Sex: Treating [name]: [name] (87 y.o. Male) [name] Primary Care Raliyah Montella: [name] Other Clinician: Referring Mohd. Derflinger: Treating Jeanni Allshouse/Extender: [name] in Treatment: 0 Active Problems Location of Pain Severity and Description of Pain Patient  Has Paino No Site Locations Pain Management and Medication Current Pain Management: Electronic Signature(s) Signed: 07/11/2022 5:03:05 PM By: Robert Ard [name] Entered By: [name] on 07/11/2022 11:05:06 -------------------------------------------------------------------------------- Patient/Caregiver Education Details Patient Name: Date of Service: [name] 9/15/2023andnbsp10:30 A M Medical Record Number: [id number] Patient Account Number: [id number] Date of Birth/Gender: Treating [name]: [birth year] (87 y.o. Male) [name] Primary Care Physician: [name] Other Clinician: Referring Physician: Treating Physician/Extender: [name] in Treatment: 0 Education Assessment Education Provided To: Patient Education Topics Provided Welcome T [facility]: o Handouts: Welcome T [facility] o Methods: Explain/Verbal Responses: Reinforcements needed, State content correctly Wound/Skin Impairment: Handouts: Caring for Your Ulcer, Skin Care Do's and Dont's Methods: Explain/Verbal Responses: Reinforcements needed, State content correctly Electronic Signature(s) Signed: 07/11/2022 5:03:05 PM By: Robert Ard [name] Entered By: [name] on 07/11/2022 11:07:20 -------------------------------------------------------------------------------- Wound Assessment Details Patient Name: Date of Service: [name] 07/11/2022 10:30 A M Medical Record Number: [id number] Patient Account Number: [id number] Date of Birth/Sex: Treating [name]: [name] (87 y.o. Male) Primary Care Ian Cavey: [name] Other Clinician: Referring Denisa Enterline: Treating Pheonix Clinkscale/Extender: [name] in Treatment: 0 Wound Status Wound Number: 1 Primary Etiology: T be determined o Wound Location: Medial T Second oe Wound Status: Open Wounding Event: Gradually Appeared Comorbid Cataracts, Asthma, Coronary Artery  Disease, History: Hypertension Date Acquired: 01/25/2022 Tania Ade  Of Treatment: 0 Clustered Wound: No Photos Wound Measurements Length: (cm) 0.5 Width: (cm) 0.6 Depth: (cm) 0.1 Area: (cm) 0.236 Volume: (cm) 0.024 % Reduction in Area: 0% % Reduction in Volume: 0% Tunneling: No Undermining: Yes Starting Position (o'clock): 9 Ending Position (o'clock): 1 Wound Description Classification: Partial Thickness Exudate Amount: Medium Exudate Type: Serosanguineous Exudate Color: red, brown Foul Odor After Cleansing: No Slough/Fibrino Yes Wound Bed Granulation Amount: Large (67-100%) Exposed Structure Necrotic Amount: Small (1-33%) Fascia Exposed: No Necrotic Quality: Adherent Slough Fat Layer (Subcutaneous Tissue) Exposed: Yes Tendon Exposed: No Muscle Exposed: No Joint Exposed: No Bone Exposed: No Treatment Notes Wound #1 (Toe Second) Wound Laterality: Medial Cleanser Soap and Water Discharge Instruction: May shower and wash wound with dial antibacterial soap and water prior to dressing change. Peri-Wound Care Sween Lotion (Moisturizing lotion) Discharge Instruction: Apply moisturizing lotion as directed Topical Primary Dressing KerraCel Ag Gelling Fiber Dressing, 2x2 in (silver alginate) Discharge Instruction: Apply silver alginate to wound bed as instructed Secondary Dressing Optifoam Non-Adhesive Dressing, 4x4 in Discharge Instruction: Apply over primary dressing as directed. Secured With [employer], 4.5x3.1 (in/yd) Discharge Instruction: Secure with Kerlix as directed. 20M Medipore Soft Cloth Surgical T 2x10 (in/yd) ape Discharge Instruction: Secure with tape as directed. Compression Wrap Compression Stockings Add-Ons Electronic Signature(s) Signed: 07/11/2022 5:03:05 PM By: Robert Ard [name] Previous Signature: 07/11/2022 10:37:40 AM Version By: [name] Entered By: [name] on 07/11/2022  11:04:56 -------------------------------------------------------------------------------- Vitals Details Patient Name: Date of Service: [name] 07/11/2022 10:30 A M Medical Record Number: [id number] Patient Account Number: [id number] Date of Birth/Sex: Treating [name]: [name] (87 y.o. Male) Primary Care Calandria Mullings: [name] Other Clinician: Referring Polk Minor: Treating Henley Blyth/Extender: [name] in Treatment: 0 Vital Signs Time Taken: 10:25 Temperature (F): 97.8 Height (in): 66 Pulse (bpm): 66 Source: Stated Respiratory Rate (breaths/min): 16 Weight (lbs): 145 Blood Pressure (mmHg): 127/61 Source: Stated Reference Range: 80 - 120 mg / dl Body Mass Index (BMI): 23.4 Electronic Signature(s) Signed: 07/11/2022 10:37:40 AM By: [name] Entered By: [name] on 07/11/2022 10:26:00

## 2022-07-11 NOTE — Progress Notes (Signed)
GURMAN, ASHLAND ([id number]) Visit Report for 07/11/2022 Abuse Risk Screen Details Patient Name: Date of Service: ARCADIO, COPE CK 07/11/2022 10:30 A M Medical Record Number: [id number] Patient Account Number: [id number] Date of Birth/Sex: Treating RN: 20-Oct-1935 (87 y.o. Male) [name] Primary Care Deng Kemler: Neena Rhymes Other Clinician: Referring Antwon Rochin: Treating Catha Ontko/Extender: Brendia Sacks in Treatment: 0 Abuse Risk Screen Items Answer ABUSE RISK SCREEN: Has anyone close to you tried to hurt or harm you recentlyo No Do you feel uncomfortable with anyone in your familyo No Has anyone forced you do things that you didnt want to doo No Electronic Signature(s) Signed: 07/11/2022 5:03:05 PM By: [name] RN Entered By: [name] on 07/11/2022 10:55:47 -------------------------------------------------------------------------------- Activities of Daily Living Details Patient Name: Date of Service: KAYLUB, DETIENNE CK 07/11/2022 10:30 A M Medical Record Number: [id number] Patient Account Number: [id number] Date of Birth/Sex: Treating RN: Mar 18, 1935 (87 y.o. Male) [name] Primary Care Samanvi Cuccia: Neena Rhymes Other Clinician: Referring Perris Conwell: Treating Nayali Talerico/Extender: Brendia Sacks in Treatment: 0 Activities of Daily Living Items Answer Activities of Daily Living (Please select one for each item) Drive Automobile Completely Able T Medications ake Completely Able Use T elephone Completely Able Care for Appearance Completely Able Use T oilet Completely Able Bath / Shower Completely Able Dress Self Completely Able Feed Self Completely Able Walk Completely Able Get In / Out Bed Completely Able Housework Completely Able Prepare Meals Completely Able Handle Money Completely Able Shop for Self Completely Able Electronic Signature(s) Signed: 07/11/2022 5:03:05 PM By: [name] RN Entered By: [name] on 07/11/2022 10:56:25 -------------------------------------------------------------------------------- Education Screening Details Patient Name: Date of Service: Ned Clines CK 07/11/2022 10:30 A M Medical Record Number: [id number] Patient Account Number: [id number] Date of Birth/Sex: Treating RN: 1935-08-23 (87 y.o. Male) [name] Primary Care Ashana Tullo: Neena Rhymes Other Clinician: Referring Sharina Petre: Treating Nyleah Mcginnis/Extender: Brendia Sacks in Treatment: 0 Primary Learner Assessed: Patient Learning Preferences/Education Level/Primary Language Learning Preference: Explanation Highest Education Level: College or Above Preferred Language: English Cognitive Barrier Language Barrier: No Translator Needed: No Memory Deficit: No Emotional Barrier: No Cultural/Religious Beliefs Affecting Medical Care: No Physical Barrier Impaired Vision: No Impaired Hearing: No Decreased Hand dexterity: No Knowledge/Comprehension Knowledge Level: High Comprehension Level: High Ability to understand written instructions: High Ability to understand verbal instructions: High Motivation Anxiety Level: Calm Cooperation: Cooperative Education Importance: Acknowledges Need Interest in Health Problems: Asks Questions Perception: Coherent Willingness to Engage in Self-Management High Activities: Electronic Signature(s) Signed: 07/11/2022 5:03:05 PM By: [name] RN Entered By: [name] on 07/11/2022 10:57:08 -------------------------------------------------------------------------------- Fall Risk Assessment Details Patient Name: Date of Service: Ned Clines CK 07/11/2022 10:30 A M Medical Record Number: [id number] Patient Account Number: [id number] Date of Birth/Sex: Treating RN: 05-25-1935 (87 y.o. Male) [name] Primary Care Mykai Wendorf: Neena Rhymes Other Clinician: Referring Vearl Aitken: Treating Kashonda Sarkisyan/Extender: Brendia Sacks in Treatment: 0 Fall Risk Assessment Items Have you had 2 or more falls in the last 12 monthso 0 No Have you had any fall that resulted in injury in the last 12 monthso 0 No FALLS RISK SCREEN History of falling - immediate or within 3 months 0 No Secondary diagnosis (Do you have 2 or more medical diagnoseso) 0 No Ambulatory aid None/bed rest/wheelchair/nurse 0 No Crutches/cane/walker 0 No Furniture 0 No Intravenous therapy Access/Saline/Heparin Lock 0 No Gait/Transferring Normal/ bed rest/ wheelchair 0 No Weak (short steps with or without shuffle, stooped but able to lift head while walking, may seek  0 No support from furniture) Impaired (short steps with shuffle, may have difficulty arising from chair, head down, impaired 0 No balance) Mental Status Oriented to own ability 0 Yes Electronic Signature(s) Signed: 07/11/2022 5:03:05 PM By: [name] RN Entered By: [name] on 07/11/2022 10:59:53 -------------------------------------------------------------------------------- Foot Assessment Details Patient Name: Date of Service: Ned Clines CK 07/11/2022 10:30 A M Medical Record Number: [id number] Patient Account Number: [id number] Date of Birth/Sex: Treating RN: [birth year] (86 y.o. Male) [name] Primary Care Isa Kohlenberg: Neena Rhymes Other Clinician: Referring Kennedy Bohanon: Treating Priyana Mccarey/Extender: Brendia Sacks in Treatment: 0 Foot Assessment Items Site Locations + = Sensation present, - = Sensation absent, C = Callus, U = Ulcer R = Redness, W = Warmth, M = Maceration, PU = Pre-ulcerative lesion F = Fissure, S = Swelling, D = Dryness Assessment Right: Left: Other Deformity: No No Prior Foot Ulcer: No No Prior Amputation: No No Charcot Joint: No No Ambulatory Status: Ambulatory Without Help Gait: Steady Electronic Signature(s) Signed: 07/11/2022 5:03:05 PM By: [name] RN Entered By:  [name] on 07/11/2022 11:01:45 -------------------------------------------------------------------------------- Nutrition Risk Screening Details Patient Name: Date of Service: DALEON, WILLINGER CK 07/11/2022 10:30 A M Medical Record Number: [id number] Patient Account Number: [id number] Date of Birth/Sex: Treating RN: 20-Apr-1935 (87 y.o. Male) [name] Primary Care Nehemias Sauceda: Neena Rhymes Other Clinician: Referring Teri Diltz: Treating Telesa Jeancharles/Extender: Brendia Sacks in Treatment: 0 Height (in): 66 Weight (lbs): 145 Body Mass Index (BMI): 23.4 Nutrition Risk Screening Items Score Screening NUTRITION RISK SCREEN: I have an illness or condition that made me change the kind and/or amount of food I eat 0 No I eat fewer than two meals per day 0 No I eat few fruits and vegetables, or milk products 0 No I have three or more drinks of beer, liquor or wine almost every day 0 No I have tooth or mouth problems that make it hard for me to eat 0 No I don't always have enough money to buy the food I need 0 No I eat alone most of the time 0 No I take three or more different prescribed or over-the-counter drugs a day 1 Yes Without wanting to, I have lost or gained 10 pounds in the last six months 0 No I am not always physically able to shop, cook and/or feed myself 0 No Nutrition Protocols Good Risk Protocol 0 No interventions needed Moderate Risk Protocol High Risk Proctocol Risk Level: Good Risk Score: 1 Electronic Signature(s) Signed: 07/11/2022 5:03:05 PM By: [name] RN Entered By: [name] on 07/11/2022 11:00:07

## 2022-07-17 ENCOUNTER — Ambulatory Visit: Payer: Medicare Other | Admitting: Podiatry

## 2022-07-17 DIAGNOSIS — L97511 Non-pressure chronic ulcer of other part of right foot limited to breakdown of skin: Secondary | ICD-10-CM | POA: Diagnosis not present

## 2022-07-17 DIAGNOSIS — L84 Corns and callosities: Secondary | ICD-10-CM | POA: Diagnosis not present

## 2022-07-17 NOTE — Progress Notes (Signed)
Subjective:  Patient ID: [name], male    DOB: [birth year],  MRN: [id number]  Chief Complaint  Patient presents with   Wound Check    87 y.o. male presents with the above complaint.  Patient presents with right second digit medial side ulceration.  Patient states that this has been present for a little while.  He states that the big toes rubbing against the second toe leading to breakdown of the skin.  It is not probing a deep tissue.  He denies any other acute complaints last time when he saw me there was an ulceration present.  He has been keeping it covered   Review of Systems: Negative except as noted in the HPI. Denies N/V/F/Ch.  Past Medical History:  Diagnosis Date   Allergy    Arthritis    "scattered" (10/26/2018)   Asthma    C. difficile diarrhea    CAD (coronary artery disease) 2005   s/p PCI of RCA   Carotid artery stenosis    1 to 39% right ICA and 40 to 59% left ICA stenosis by Dopplers 01/2022   Carotid stenosis    "? side; Dr. Edilia Bo"   Cataract    NS OD   Cognitive impairment, mild, so stated    Colitis    Erectile dysfunction    GERD (gastroesophageal reflux disease)    Hyperlipidemia    Hypertension    Perforated stomach, acute 10/23/2018   Peripheral vascular disease (HCC)    Retinal detachment    Vertebrobasilar artery syndrome    Vertigo     Current Outpatient Medications:    acetaminophen (TYLENOL) 500 MG tablet, Take 500 mg by mouth 2 (two) times daily., Disp: , Rfl:    albuterol (PROVENTIL) (2.5 MG/3ML) 0.083% nebulizer solution, Take 3 mLs (2.5 mg total) by nebulization every 6 (six) hours as needed for wheezing or shortness of breath., Disp: 90 vial, Rfl: 3   albuterol (VENTOLIN HFA) 108 (90 Base) MCG/ACT inhaler, INHALE 2 PUFFS INTO LUNGS EVERY 6 HOURS AS NEEDED FOR WHEEZING OR SHORTNESS OF BREATH., Disp: 8.5 g, Rfl: 3   amLODipine (NORVASC) 5 MG tablet, Take 1 tablet (5 mg total) by mouth daily., Disp: 90 tablet, Rfl: 3   aspirin EC 81 MG  tablet, Take 81 mg by mouth daily., Disp: , Rfl:    atorvastatin (LIPITOR) 80 MG tablet, Take 1 tablet (80 mg total) by mouth daily., Disp: 90 tablet, Rfl: 3   azelastine (ASTELIN) 0.1 % nasal spray, Place 1 spray into both nostrils 2 (two) times daily., Disp: 30 mL, Rfl: 5   carvedilol (COREG) 6.25 MG tablet, Take 1 tablet (6.25 mg total) by mouth 2 (two) times daily with a meal., Disp: 180 tablet, Rfl: 3   cholecalciferol (VITAMIN D3) 25 MCG (1000 UT) tablet, Take 1,000 Units by mouth daily., Disp: , Rfl:    donepezil (ARICEPT) 10 MG tablet, TAKE 1 TABLET BY MOUTH DAILY, Disp: 90 tablet, Rfl: 1   Eyelid Cleansers (OCUSOFT EYELID CLEANSING) PADS, Place 1 application into both eyes 2 (two) times daily., Disp: , Rfl:    fish oil-omega-3 fatty acids 1000 MG capsule, Take 2 capsules (2 g total) by mouth 2 (two) times daily., Disp: 360 capsule, Rfl: 3   fluticasone (FLONASE) 50 MCG/ACT nasal spray, 1 spray each nostril twice daily, Disp: 16 g, Rfl: 11   fluticasone furoate-vilanterol (BREO ELLIPTA) 100-25 MCG/ACT AEPB, Inhale 1 puff into the lungs daily., Disp: 60 each, Rfl: 0   fluticasone furoate-vilanterol (  BREO ELLIPTA) 100-25 MCG/INH AEPB, Inhale 1 puff into the lungs daily., Disp: 28 each, Rfl: 0   fluticasone furoate-vilanterol (BREO ELLIPTA) 100-25 MCG/INH AEPB, INHALE 1 PUFF INTO THE LUNGS DAILY, Disp: 180 each, Rfl: 3   gabapentin (NEURONTIN) 100 MG capsule, Take 2 capsules (200 mg total) by mouth at bedtime., Disp: 180 capsule, Rfl: 1   gabapentin (NEURONTIN) 300 MG capsule, TAKE 1 CAPSULE(300 MG) BY MOUTH THREE TIMES DAILY, Disp: 90 capsule, Rfl: 3   hydrochlorothiazide (HYDRODIURIL) 25 MG tablet, Take 1 tablet (25 mg total) by mouth daily., Disp: 90 tablet, Rfl: 3   montelukast (SINGULAIR) 10 MG tablet, TAKE 1 TABLET BY MOUTH AT BEDTIME. GENERIC EQUIVALENT FOR SINGULAIR., Disp: 90 tablet, Rfl: 3   ofloxacin (OCUFLOX) 0.3 % ophthalmic solution, INSTILL 1 DROP IN LEFT EYE FOUR TIMES DAILY FOR  7 DAYS BEGINNING AFTER SURGERY, Disp: , Rfl:    Polyvinyl Alcohol-Povidone (REFRESH OP), Place 1 drop into both eyes 2 (two) times daily., Disp: , Rfl:    Saw Palmetto 500 MG CAPS, Take 1,000 mg by mouth 2 (two) times daily. , Disp: , Rfl:   Social History   Tobacco Use  Smoking Status Former   Types: Cigars   Quit date: 05/13/1961   Years since quitting: 61.2   Passive exposure: Never  Smokeless Tobacco Never  Tobacco Comments   "never inhaled" in the early 60's     Allergies  Allergen Reactions   Neosporin [Bacitracin-Polymyxin B] Other (See Comments)    Redness   Objective:  There were no vitals filed for this visit. There is no height or weight on file to calculate BMI. Constitutional Well developed. Well nourished.  Vascular Dorsalis pedis pulses palpable bilaterally. Posterior tibial pulses palpable bilaterally. Capillary refill normal to all digits.  No cyanosis or clubbing noted. Pedal hair growth normal.  Neurologic Normal speech. Oriented to person, place, and time. Epicritic sensation to light touch grossly present bilaterally.  Dermatologic Right medial aspect of second digit limited to breakdown of skin ulceration noted.  Measuring 1 cm by half centimeter by 0.1 cm.  Granular wound bed noted no probing down to bone or deep tissue noted.  No exposure of tendon noted.  No signs of infection noted  Orthopedic: Hallux valgus deformity and hammertoe contracture second digit likely leading to the excessive pressure.   Radiographs: None Assessment:   1. Heloma molle   2. Ulcer of toe, right, limited to breakdown of skin [facility])    Plan:  Patient was evaluated and treated and all questions answered.  Right second digit medial PIPJ ulceration limited to the breakdown of the skin -All Russians and concerns were discussed with the patient in extensive detail -At this point I discussed that he can keep it covered with antibiotic and a Band-Aid as well as spacers between  the toes to keep the toes separated and allow the foot to naturally heal.  I discussed this with the patient and he states understanding.  Spacers were dispensed. -If there is no improvement we will do Betadine wet-to-dry dressing and possible surgical intervention.  No follow-ups on file.  Right medial PIPJ ulceration second toe.  Troponin right Band-Aid and offloading pads does not probe down to bone

## 2022-07-18 ENCOUNTER — Encounter (HOSPITAL_BASED_OUTPATIENT_CLINIC_OR_DEPARTMENT_OTHER): Payer: Medicare Other | Admitting: General Surgery

## 2022-07-18 NOTE — Progress Notes (Unsigned)
[place] Phone: [phone] Subjective:   [name], am serving as a scribe for Dr. Hulan Saas.  I'm seeing this patient by the request  of:  Midge Minium, MD  CC: hamstring follow up   [id number]  04/14/2022 Much better after the injection.  Seems to be better than the epidurals.  Follow-up with me again in 3 to 4 months we can repeat as necessary  Update 07/21/2022 [name] is a 87 y.o. male coming in with complaint of L hamstring pain. Patient states that his pain has improved since last injection.    Patient the pelvis 29 November 2020 showing the patient did have small "partial tearing of the hamstring right greater than left.  Undergone a hamstring proximal tendon sheath injection in February 2023.  Had made significant improvement previously.    Past Medical History:  Diagnosis Date   Allergy    Arthritis    "scattered" (10/26/2018)   Asthma    C. difficile diarrhea    CAD (coronary artery disease) 2005   s/p PCI of RCA   Carotid artery stenosis    1 to 39% right ICA and 40 to 59% left ICA stenosis by Dopplers 01/2022   Carotid stenosis    "? side; Dr. Scot Dock"   Cataract    NS OD   Cognitive impairment, mild, so stated    Colitis    Erectile dysfunction    GERD (gastroesophageal reflux disease)    Hyperlipidemia    Hypertension    Perforated stomach, acute 10/23/2018   Peripheral vascular disease ([place])    Retinal detachment    Vertebrobasilar artery syndrome    Vertigo    Past Surgical History:  Procedure Laterality Date   APPENDECTOMY  1954   CATARACT EXTRACTION Left 08/08/2019   Dr. Bing Plume   COLONOSCOPY     ESOPHAGOGASTRODUODENOSCOPY (EGD) WITH ESOPHAGEAL DILATION  09/2018   EYE SURGERY     LAPAROTOMY N/A 10/23/2018   Procedure: EXPLORATORY LAPAROTOMY WITH GRAHAM PATCH FOR PERFORATED ULCER AND PLACEMENT OF GASTRIC TUBE;  Surgeon: Rolm Bookbinder, MD;   Location: [place];  Service: General;  Laterality: N/A;   NASAL SINUS SURGERY  05/2018   RETINAL DETACHMENT SURGERY     "? side"   UPPER GASTROINTESTINAL ENDOSCOPY     Social History   Socioeconomic History   Marital status: Married    Spouse name: Not on file   Number of children: 3   Years of education: Not on file   Highest education level: Not on file  Occupational History   Occupation: Retired Social research officer, government Captain/[employer]    Employer: RETIRED  Tobacco Use   Smoking status: Former    Types: Cigars    Quit date: 05/13/1961    Years since quitting: 61.2    Passive exposure: Never   Smokeless tobacco: Never   Tobacco comments:    "never inhaled" in the early 60's   Vaping Use   Vaping Use: Never used  Substance and Sexual Activity   Alcohol use: Yes    Alcohol/week: 14.0 standard drinks of alcohol    Types: 14 Glasses of wine per week   Drug use: Never   Sexual activity: Not Currently  Other Topics Concern   Not on file  Social History Narrative   Married with 1 son and 2 daughters.  Retired.   2 caffeinated beverages daily, 3 glasses of wine daily   Never  smoker no drug use no tobacco   Social Determinants of Health   Financial Resource Strain: Low Risk  (05/08/2022)   Overall Financial Resource Strain (CARDIA)    Difficulty of Paying Living Expenses: Not hard at all  Food Insecurity: No Food Insecurity (05/08/2022)   Hunger Vital Sign    Worried About Running Out of Food in the Last Year: Never true    Ran Out of Food in the Last Year: Never true  Transportation Needs: No Transportation Needs (05/08/2022)   PRAPARE - Administrator, Civil Service (Medical): No    Lack of Transportation (Non-Medical): No  Physical Activity: Insufficiently Active (05/08/2022)   Exercise Vital Sign    Days of Exercise per Week: 3 days    Minutes of Exercise per Session: 30 min  Stress: No Stress Concern Present (05/08/2022)   [employer] of Occupational Health -  Occupational Stress Questionnaire    Feeling of Stress : Not at all  Social Connections: Moderately Integrated (05/08/2022)   Social Connection and Isolation Panel [NHANES]    Frequency of Communication with Friends and Family: Three times a week    Frequency of Social Gatherings with Friends and Family: Three times a week    Attends Religious Services: Never    Active Member of Clubs or Organizations: Yes    Attends Engineer, structural: More than 4 times per year    Marital Status: Married   Allergies  Allergen Reactions   Neosporin [Bacitracin-Polymyxin B] Other (See Comments)    Redness   Family History  Problem Relation Age of Onset   Stroke Mother    Hyperlipidemia Mother    Hypertension Mother    Deep vein thrombosis Mother    Stroke Father    Hyperlipidemia Father    Hypertension Father    Deep vein thrombosis Father    Diverticulitis Father    Ulcerative colitis Father    Colon polyps Father    Colon cancer Neg Hx    Esophageal cancer Neg Hx    Stomach cancer Neg Hx    Rectal cancer Neg Hx      Current Outpatient Medications (Cardiovascular):    amLODipine (NORVASC) 5 MG tablet, Take 1 tablet (5 mg total) by mouth daily.   atorvastatin (LIPITOR) 80 MG tablet, Take 1 tablet (80 mg total) by mouth daily.   carvedilol (COREG) 6.25 MG tablet, Take 1 tablet (6.25 mg total) by mouth 2 (two) times daily with a meal.   hydrochlorothiazide (HYDRODIURIL) 25 MG tablet, Take 1 tablet (25 mg total) by mouth daily.  Current Outpatient Medications (Respiratory):    albuterol (PROVENTIL) (2.5 MG/3ML) 0.083% nebulizer solution, Take 3 mLs (2.5 mg total) by nebulization every 6 (six) hours as needed for wheezing or shortness of breath.   albuterol (VENTOLIN HFA) 108 (90 Base) MCG/ACT inhaler, INHALE 2 PUFFS INTO LUNGS EVERY 6 HOURS AS NEEDED FOR WHEEZING OR SHORTNESS OF BREATH.   azelastine (ASTELIN) 0.1 % nasal spray, Place 1 spray into both nostrils 2 (two) times  daily.   fluticasone (FLONASE) 50 MCG/ACT nasal spray, 1 spray each nostril twice daily   fluticasone furoate-vilanterol (BREO ELLIPTA) 100-25 MCG/ACT AEPB, Inhale 1 puff into the lungs daily.   fluticasone furoate-vilanterol (BREO ELLIPTA) 100-25 MCG/INH AEPB, Inhale 1 puff into the lungs daily.   fluticasone furoate-vilanterol (BREO ELLIPTA) 100-25 MCG/INH AEPB, INHALE 1 PUFF INTO THE LUNGS DAILY   montelukast (SINGULAIR) 10 MG tablet, TAKE 1 TABLET BY MOUTH AT BEDTIME.  GENERIC EQUIVALENT FOR SINGULAIR.  Current Outpatient Medications (Analgesics):    acetaminophen (TYLENOL) 500 MG tablet, Take 500 mg by mouth 2 (two) times daily.   aspirin EC 81 MG tablet, Take 81 mg by mouth daily.   Current Outpatient Medications (Other):    cholecalciferol (VITAMIN D3) 25 MCG (1000 UT) tablet, Take 1,000 Units by mouth daily.   donepezil (ARICEPT) 10 MG tablet, TAKE 1 TABLET BY MOUTH DAILY   Eyelid Cleansers (OCUSOFT EYELID CLEANSING) PADS, Place 1 application into both eyes 2 (two) times daily.   fish oil-omega-3 fatty acids 1000 MG capsule, Take 2 capsules (2 g total) by mouth 2 (two) times daily.   gabapentin (NEURONTIN) 100 MG capsule, Take 2 capsules (200 mg total) by mouth at bedtime.   gabapentin (NEURONTIN) 300 MG capsule, TAKE 1 CAPSULE(300 MG) BY MOUTH THREE TIMES DAILY   ofloxacin (OCUFLOX) 0.3 % ophthalmic solution, INSTILL 1 DROP IN LEFT EYE FOUR TIMES DAILY FOR 7 DAYS BEGINNING AFTER SURGERY   Polyvinyl Alcohol-Povidone (REFRESH OP), Place 1 drop into both eyes 2 (two) times daily.   Saw Palmetto 500 MG CAPS, Take 1,000 mg by mouth 2 (two) times daily.    Objective  Blood pressure (!) 144/56, pulse 74, height 5\' 5"  (1.651 m), weight 140 lb (63.5 kg), SpO2 95 %.   General: No apparent distress alert and oriented x3 mood and affect normal, dressed appropriately.  HEENT: Pupils equal, extraocular movements intact  Respiratory: Patient's speak in full sentences and does not appear short  of breath  Cardiovascular: No lower extremity edema, non tender, no erythema  Mild loss of lordosis  No tightness in the hamstring     Impression and Recommendations:    The above documentation has been reviewed and is accurate and complete , DO

## 2022-07-21 ENCOUNTER — Ambulatory Visit: Payer: Self-pay

## 2022-07-21 ENCOUNTER — Ambulatory Visit: Payer: Medicare Other | Admitting: Family Medicine

## 2022-07-21 VITALS — BP 144/56 | HR 74 | Ht 65.0 in | Wt 140.0 lb

## 2022-07-21 DIAGNOSIS — S76312D Strain of muscle, fascia and tendon of the posterior muscle group at thigh level, left thigh, subsequent encounter: Secondary | ICD-10-CM

## 2022-07-21 DIAGNOSIS — M76892 Other specified enthesopathies of left lower limb, excluding foot: Secondary | ICD-10-CM | POA: Diagnosis not present

## 2022-07-21 NOTE — Patient Instructions (Signed)
Make appt for December and if ok then you can push it back 2 months

## 2022-07-21 NOTE — Assessment & Plan Note (Signed)
Patient is doing well but not having as much pain as he had previously.  We discussed posture and ergonomics.  Which activities to do which ones to avoid.  Keep working on core strengthening.  Follow-up with me again in 6 to 8 weeks.

## 2022-08-08 ENCOUNTER — Ambulatory Visit: Payer: Medicare Other | Admitting: Podiatry

## 2022-08-08 DIAGNOSIS — L97511 Non-pressure chronic ulcer of other part of right foot limited to breakdown of skin: Secondary | ICD-10-CM | POA: Diagnosis not present

## 2022-08-08 DIAGNOSIS — L84 Corns and callosities: Secondary | ICD-10-CM | POA: Diagnosis not present

## 2022-08-08 NOTE — Progress Notes (Signed)
Subjective:  Patient ID: [name], male    DOB: [birth year],  MRN: [id number]  Chief Complaint  Patient presents with   Foot Ulcer    87 y.o. male presents with the above complaint.  Patient presents for follow-up of right second digit medial side ulceration.  He states he is doing a lot better.  They have been doing triple antibiotic and a Band-Aid and keeping it covered.  Denies any other acute complaints.  Made some shoe modifications     Review of Systems: Negative except as noted in the HPI. Denies N/V/F/Ch.  Past Medical History:  Diagnosis Date   Allergy    Arthritis    "scattered" (10/26/2018)   Asthma    C. difficile diarrhea    CAD (coronary artery disease) 2005   s/p PCI of RCA   Carotid artery stenosis    1 to 39% right ICA and 40 to 59% left ICA stenosis by Dopplers 01/2022   Carotid stenosis    "? side; Dr. Scot Dock"   Cataract    NS OD   Cognitive impairment, mild, so stated    Colitis    Erectile dysfunction    GERD (gastroesophageal reflux disease)    Hyperlipidemia    Hypertension    Perforated stomach, acute 10/23/2018   Peripheral vascular disease ([place])    Retinal detachment    Vertebrobasilar artery syndrome    Vertigo     Current Outpatient Medications:    acetaminophen (TYLENOL) 500 MG tablet, Take 500 mg by mouth 2 (two) times daily., Disp: , Rfl:    albuterol (PROVENTIL) (2.5 MG/3ML) 0.083% nebulizer solution, Take 3 mLs (2.5 mg total) by nebulization every 6 (six) hours as needed for wheezing or shortness of breath., Disp: 90 vial, Rfl: 3   albuterol (VENTOLIN HFA) 108 (90 Base) MCG/ACT inhaler, INHALE 2 PUFFS INTO LUNGS EVERY 6 HOURS AS NEEDED FOR WHEEZING OR SHORTNESS OF BREATH., Disp: 8.5 g, Rfl: 3   amLODipine (NORVASC) 5 MG tablet, Take 1 tablet (5 mg total) by mouth daily., Disp: 90 tablet, Rfl: 3   aspirin EC 81 MG tablet, Take 81 mg by mouth daily., Disp: , Rfl:    atorvastatin (LIPITOR) 80 MG tablet, Take 1 tablet (80 mg total) by  mouth daily., Disp: 90 tablet, Rfl: 3   azelastine (ASTELIN) 0.1 % nasal spray, Place 1 spray into both nostrils 2 (two) times daily., Disp: 30 mL, Rfl: 5   carvedilol (COREG) 6.25 MG tablet, Take 1 tablet (6.25 mg total) by mouth 2 (two) times daily with a meal., Disp: 180 tablet, Rfl: 3   cholecalciferol (VITAMIN D3) 25 MCG (1000 UT) tablet, Take 1,000 Units by mouth daily., Disp: , Rfl:    donepezil (ARICEPT) 10 MG tablet, TAKE 1 TABLET BY MOUTH DAILY, Disp: 90 tablet, Rfl: 1   Eyelid Cleansers (OCUSOFT EYELID CLEANSING) PADS, Place 1 application into both eyes 2 (two) times daily., Disp: , Rfl:    fish oil-omega-3 fatty acids 1000 MG capsule, Take 2 capsules (2 g total) by mouth 2 (two) times daily., Disp: 360 capsule, Rfl: 3   fluticasone (FLONASE) 50 MCG/ACT nasal spray, 1 spray each nostril twice daily, Disp: 16 g, Rfl: 11   fluticasone furoate-vilanterol (BREO ELLIPTA) 100-25 MCG/ACT AEPB, Inhale 1 puff into the lungs daily., Disp: 60 each, Rfl: 0   fluticasone furoate-vilanterol (BREO ELLIPTA) 100-25 MCG/INH AEPB, Inhale 1 puff into the lungs daily., Disp: 28 each, Rfl: 0   fluticasone furoate-vilanterol (BREO ELLIPTA) 100-25 MCG/INH  AEPB, INHALE 1 PUFF INTO THE LUNGS DAILY, Disp: 180 each, Rfl: 3   gabapentin (NEURONTIN) 100 MG capsule, Take 2 capsules (200 mg total) by mouth at bedtime., Disp: 180 capsule, Rfl: 1   gabapentin (NEURONTIN) 300 MG capsule, TAKE 1 CAPSULE(300 MG) BY MOUTH THREE TIMES DAILY, Disp: 90 capsule, Rfl: 3   hydrochlorothiazide (HYDRODIURIL) 25 MG tablet, Take 1 tablet (25 mg total) by mouth daily., Disp: 90 tablet, Rfl: 3   montelukast (SINGULAIR) 10 MG tablet, TAKE 1 TABLET BY MOUTH AT BEDTIME. GENERIC EQUIVALENT FOR SINGULAIR., Disp: 90 tablet, Rfl: 3   ofloxacin (OCUFLOX) 0.3 % ophthalmic solution, INSTILL 1 DROP IN LEFT EYE FOUR TIMES DAILY FOR 7 DAYS BEGINNING AFTER SURGERY, Disp: , Rfl:    Polyvinyl Alcohol-Povidone (REFRESH OP), Place 1 drop into both eyes 2  (two) times daily., Disp: , Rfl:    Saw Palmetto 500 MG CAPS, Take 1,000 mg by mouth 2 (two) times daily. , Disp: , Rfl:   Social History   Tobacco Use  Smoking Status Former   Types: Cigars   Quit date: 05/13/1961   Years since quitting: 61.2   Passive exposure: Never  Smokeless Tobacco Never  Tobacco Comments   "never inhaled" in the early 60's     Allergies  Allergen Reactions   Neosporin [Bacitracin-Polymyxin B] Other (See Comments)    Redness   Objective:  There were no vitals filed for this visit. There is no height or weight on file to calculate BMI. Constitutional Well developed. Well nourished.  Vascular Dorsalis pedis pulses palpable bilaterally. Posterior tibial pulses palpable bilaterally. Capillary refill normal to all digits.  No cyanosis or clubbing noted. Pedal hair growth normal.  Neurologic Normal speech. Oriented to person, place, and time. Epicritic sensation to light touch grossly present bilaterally.  Dermatologic Right medial aspect of second digit limited to breakdown of skin ulceration noted.  Measuring 1 cm by half centimeter by 0.1 cm.  Granular wound bed noted no probing down to bone or deep tissue noted.  No exposure of tendon noted.  No signs of infection noted  Orthopedic: Hallux valgus deformity and hammertoe contracture second digit likely leading to the excessive pressure.   Radiographs: None Assessment:   1. Heloma molle   2. Ulcer of toe, right, limited to breakdown of skin [facility])     Plan:  Patient was evaluated and treated and all questions answered.  Right second digit medial PIPJ ulceration limited to the breakdown of the skin -All Russians and concerns were discussed with the patient in extensive detail -At this point I discussed that he can keep it covered with antibiotic and a Band-Aid as well as spacers between the toes to keep the toes separated and allow the foot to naturally heal.  I discussed this with the patient and he  states understanding.  Spacers were dispensed. -It is clinically improving we will continue doing triple antibiotic and a Band-Aid  No follow-ups on file.    Thank you

## 2022-08-30 ENCOUNTER — Other Ambulatory Visit: Payer: Self-pay | Admitting: Pulmonary Disease

## 2022-08-31 ENCOUNTER — Other Ambulatory Visit: Payer: Self-pay | Admitting: Primary Care

## 2022-09-01 ENCOUNTER — Telehealth: Payer: Self-pay | Admitting: Pulmonary Disease

## 2022-09-01 MED ORDER — ALBUTEROL SULFATE HFA 108 (90 BASE) MCG/ACT IN AERS: 1.0000 | INHALATION_SPRAY | RESPIRATORY_TRACT | 3 refills | Status: DC | PRN

## 2022-09-01 NOTE — Telephone Encounter (Signed)
Called and spoke to patient and went over medication he was needing refilled. Verified pharmacy as well. Nothing further needed

## 2022-09-05 ENCOUNTER — Ambulatory Visit: Payer: Medicare Other | Admitting: Podiatry

## 2022-09-05 DIAGNOSIS — L84 Corns and callosities: Secondary | ICD-10-CM | POA: Diagnosis not present

## 2022-09-05 DIAGNOSIS — L97511 Non-pressure chronic ulcer of other part of right foot limited to breakdown of skin: Secondary | ICD-10-CM

## 2022-09-05 NOTE — Progress Notes (Signed)
Subjective:  Patient ID: [name], male    DOB: [birth year],  MRN: [id number]  Chief Complaint  Patient presents with   Wound Check    87 y.o. male presents with the above complaint.  Patient presents for follow-up of right second digit medial side ulceration.  He states he is doing a lot better.  They have been doing triple antibiotic and a Band-Aid and keeping it covered.  Denies any other acute complaints.  Made some shoe modifications     Review of Systems: Negative except as noted in the HPI. Denies N/V/F/Ch.  Past Medical History:  Diagnosis Date   Allergy    Arthritis    "scattered" (10/26/2018)   Asthma    C. difficile diarrhea    CAD (coronary artery disease) 2005   s/p PCI of RCA   Carotid artery stenosis    1 to 39% right ICA and 40 to 59% left ICA stenosis by Dopplers 01/2022   Carotid stenosis    "? side; Dr. Edilia Bo"   Cataract    NS OD   Cognitive impairment, mild, so stated    Colitis    Erectile dysfunction    GERD (gastroesophageal reflux disease)    Hyperlipidemia    Hypertension    Perforated stomach, acute 10/23/2018   Peripheral vascular disease (HCC)    Retinal detachment    Vertebrobasilar artery syndrome    Vertigo     Current Outpatient Medications:    acetaminophen (TYLENOL) 500 MG tablet, Take 500 mg by mouth 2 (two) times daily., Disp: , Rfl:    albuterol (PROVENTIL) (2.5 MG/3ML) 0.083% nebulizer solution, Take 3 mLs (2.5 mg total) by nebulization every 6 (six) hours as needed for wheezing or shortness of breath., Disp: 90 vial, Rfl: 3   albuterol (VENTOLIN HFA) 108 (90 Base) MCG/ACT inhaler, Inhale 1-2 puffs into the lungs every 4 (four) hours as needed for wheezing or shortness of breath., Disp: 8.5 g, Rfl: 3   amLODipine (NORVASC) 5 MG tablet, Take 1 tablet (5 mg total) by mouth daily., Disp: 90 tablet, Rfl: 3   aspirin EC 81 MG tablet, Take 81 mg by mouth daily., Disp: , Rfl:    atorvastatin (LIPITOR) 80 MG tablet, Take 1 tablet (80  mg total) by mouth daily., Disp: 90 tablet, Rfl: 3   azelastine (ASTELIN) 0.1 % nasal spray, Place 1 spray into both nostrils 2 (two) times daily., Disp: 30 mL, Rfl: 5   BREO ELLIPTA 100-25 MCG/ACT AEPB, INHALE 1 PUFF INTO THE LUNGS DAILY, Disp: 180 each, Rfl: 3   carvedilol (COREG) 6.25 MG tablet, Take 1 tablet (6.25 mg total) by mouth 2 (two) times daily with a meal., Disp: 180 tablet, Rfl: 3   cholecalciferol (VITAMIN D3) 25 MCG (1000 UT) tablet, Take 1,000 Units by mouth daily., Disp: , Rfl:    donepezil (ARICEPT) 10 MG tablet, TAKE 1 TABLET BY MOUTH DAILY, Disp: 90 tablet, Rfl: 1   Eyelid Cleansers (OCUSOFT EYELID CLEANSING) PADS, Place 1 application into both eyes 2 (two) times daily., Disp: , Rfl:    fish oil-omega-3 fatty acids 1000 MG capsule, Take 2 capsules (2 g total) by mouth 2 (two) times daily., Disp: 360 capsule, Rfl: 3   fluticasone (FLONASE) 50 MCG/ACT nasal spray, 1 spray each nostril twice daily, Disp: 16 g, Rfl: 11   fluticasone furoate-vilanterol (BREO ELLIPTA) 100-25 MCG/ACT AEPB, Inhale 1 puff into the lungs daily., Disp: 60 each, Rfl: 0   fluticasone furoate-vilanterol (BREO ELLIPTA) 100-25 MCG/INH  AEPB, Inhale 1 puff into the lungs daily., Disp: 28 each, Rfl: 0   gabapentin (NEURONTIN) 100 MG capsule, Take 2 capsules (200 mg total) by mouth at bedtime., Disp: 180 capsule, Rfl: 1   gabapentin (NEURONTIN) 300 MG capsule, TAKE 1 CAPSULE(300 MG) BY MOUTH THREE TIMES DAILY, Disp: 90 capsule, Rfl: 3   hydrochlorothiazide (HYDRODIURIL) 25 MG tablet, Take 1 tablet (25 mg total) by mouth daily., Disp: 90 tablet, Rfl: 3   montelukast (SINGULAIR) 10 MG tablet, TAKE 1 TABLET BY MOUTH AT BEDTIME. GENERIC EQUIVALENT FOR SINGULAIR., Disp: 90 tablet, Rfl: 3   ofloxacin (OCUFLOX) 0.3 % ophthalmic solution, INSTILL 1 DROP IN LEFT EYE FOUR TIMES DAILY FOR 7 DAYS BEGINNING AFTER SURGERY, Disp: , Rfl:    Polyvinyl Alcohol-Povidone (REFRESH OP), Place 1 drop into both eyes 2 (two) times daily.,  Disp: , Rfl:    Saw Palmetto 500 MG CAPS, Take 1,000 mg by mouth 2 (two) times daily. , Disp: , Rfl:   Social History   Tobacco Use  Smoking Status Former   Types: Cigars   Quit date: 05/13/1961   Years since quitting: 61.3   Passive exposure: Never  Smokeless Tobacco Never  Tobacco Comments   "never inhaled" in the early 60's     Allergies  Allergen Reactions   Neosporin [Bacitracin-Polymyxin B] Other (See Comments)    Redness   Objective:  There were no vitals filed for this visit. There is no height or weight on file to calculate BMI. Constitutional Well developed. Well nourished.  Vascular Dorsalis pedis pulses palpable bilaterally. Posterior tibial pulses palpable bilaterally. Capillary refill normal to all digits.  No cyanosis or clubbing noted. Pedal hair growth normal.  Neurologic Normal speech. Oriented to person, place, and time. Epicritic sensation to light touch grossly present bilaterally.  Dermatologic Right medial aspect of toe skin has completely reepithelialized.  No signs of infection noted no breakdown noted.  Orthopedic: Hallux valgus deformity and hammertoe contracture second digit likely leading to the excessive pressure.   Radiographs: None Assessment:   No diagnosis found.   Plan:  Patient was evaluated and treated and all questions answered.  Right second digit medial PIPJ ulceration limited to the breakdown of the skin -All Russians and concerns were discussed with the patient in extensive detail -Clinically healed.  The skin ulceration has completely epithelialized.  I discussed shoe gear modification if any foot and ankle issues arise in the future asked him to come back and see me.  He states understanding  No follow-ups on file.    Thank you

## 2022-09-29 ENCOUNTER — Other Ambulatory Visit: Payer: Self-pay | Admitting: Family Medicine

## 2022-10-10 ENCOUNTER — Other Ambulatory Visit: Payer: Self-pay | Admitting: Pulmonary Disease

## 2022-10-13 ENCOUNTER — Ambulatory Visit: Payer: Medicare Other | Admitting: Family Medicine

## 2022-10-13 NOTE — Progress Notes (Unsigned)
[facility] [place] Phone: [phone] Subjective:   [name], am serving as a scribe for Dr. Hulan Saas.  I'm seeing this patient by the request  of:  Midge Minium, MD  CC: Low back pain and hamstring pain follow-up  [id number]  07/21/2022 Patient is doing well but not having as much pain as he had previously.  We discussed posture and ergonomics.  Which activities to do which ones to avoid.  Keep working on core strengthening.  Follow-up with me again in 6 to 8 weeks.     Update 10/15/2022 [name] is a 87 y.o. male coming in with complaint of L hamstring pain. Patient states that his L hamstring is doing about the same as last time. States that it is his right one tho not his left. States that the calf pain comes and goes, has noticed that the pain does not happen as often as it used to. When the pain does come it is the same intensity.       Past Medical History:  Diagnosis Date   Allergy    Arthritis    "scattered" (10/26/2018)   Asthma    C. difficile diarrhea    CAD (coronary artery disease) 2005   s/p PCI of RCA   Carotid artery stenosis    1 to 39% right ICA and 40 to 59% left ICA stenosis by Dopplers 01/2022   Carotid stenosis    "? side; Dr. Scot Dock"   Cataract    NS OD   Cognitive impairment, mild, so stated    Colitis    Erectile dysfunction    GERD (gastroesophageal reflux disease)    Hyperlipidemia    Hypertension    Perforated stomach, acute 10/23/2018   Peripheral vascular disease ([place])    Retinal detachment    Vertebrobasilar artery syndrome    Vertigo    Past Surgical History:  Procedure Laterality Date   APPENDECTOMY  1954   CATARACT EXTRACTION Left 08/08/2019   Dr. Bing Plume   COLONOSCOPY     ESOPHAGOGASTRODUODENOSCOPY (EGD) WITH ESOPHAGEAL DILATION  09/2018   EYE SURGERY     LAPAROTOMY N/A 10/23/2018   Procedure: EXPLORATORY LAPAROTOMY WITH GRAHAM PATCH FOR  PERFORATED ULCER AND PLACEMENT OF GASTRIC TUBE;  Surgeon: Rolm Bookbinder, MD;  Location: [facility];  Service: General;  Laterality: N/A;   NASAL SINUS SURGERY  05/2018   RETINAL DETACHMENT SURGERY     "? side"   UPPER GASTROINTESTINAL ENDOSCOPY     Social History   Socioeconomic History   Marital status: Married    Spouse name: Not on file   Number of children: 3   Years of education: Not on file   Highest education level: Not on file  Occupational History   Occupation: Retired Social research officer, government Captain/[employer]    Employer: RETIRED  Tobacco Use   Smoking status: Former    Types: Cigars    Quit date: 05/13/1961    Years since quitting: 61.4    Passive exposure: Never   Smokeless tobacco: Never   Tobacco comments:    "never inhaled" in the early 60's   Vaping Use   Vaping Use: Never used  Substance and Sexual Activity   Alcohol use: Yes    Alcohol/week: 14.0 standard drinks of alcohol    Types: 14 Glasses of wine per week   Drug use: Never   Sexual activity: Not Currently  Other Topics Concern  Not on file  Social History Narrative   Married with 1 son and 2 daughters.  Retired.   2 caffeinated beverages daily, 3 glasses of wine daily   Never smoker no drug use no tobacco   Social Determinants of Health   Financial Resource Strain: Low Risk  (05/08/2022)   Overall Financial Resource Strain (CARDIA)    Difficulty of Paying Living Expenses: Not hard at all  Food Insecurity: No Food Insecurity (05/08/2022)   Hunger Vital Sign    Worried About Running Out of Food in the Last Year: Never true    Ran Out of Food in the Last Year: Never true  Transportation Needs: No Transportation Needs (05/08/2022)   PRAPARE - Hydrologist (Medical): No    Lack of Transportation (Non-Medical): No  Physical Activity: Insufficiently Active (05/08/2022)   Exercise Vital Sign    Days of Exercise per Week: 3 days    Minutes of Exercise per Session: 30 min  Stress: No Stress  Concern Present (05/08/2022)   [place]    Feeling of Stress : Not at all  Social Connections: Moderately Integrated (05/08/2022)   Social Connection and Isolation Panel [NHANES]    Frequency of Communication with Friends and Family: Three times a week    Frequency of Social Gatherings with Friends and Family: Three times a week    Attends Religious Services: Never    Active Member of Clubs or Organizations: Yes    Attends Music therapist: More than 4 times per year    Marital Status: Married   Allergies  Allergen Reactions   Neosporin [Bacitracin-Polymyxin B] Other (See Comments)    Redness   Family History  Problem Relation Age of Onset   Stroke Mother    Hyperlipidemia Mother    Hypertension Mother    Deep vein thrombosis Mother    Stroke Father    Hyperlipidemia Father    Hypertension Father    Deep vein thrombosis Father    Diverticulitis Father    Ulcerative colitis Father    Colon polyps Father    Colon cancer Neg Hx    Esophageal cancer Neg Hx    Stomach cancer Neg Hx    Rectal cancer Neg Hx      Current Outpatient Medications (Cardiovascular):    amLODipine (NORVASC) 5 MG tablet, Take 1 tablet (5 mg total) by mouth daily.   atorvastatin (LIPITOR) 80 MG tablet, Take 1 tablet (80 mg total) by mouth daily.   carvedilol (COREG) 6.25 MG tablet, Take 1 tablet (6.25 mg total) by mouth 2 (two) times daily with a meal.   hydrochlorothiazide (HYDRODIURIL) 25 MG tablet, Take 1 tablet (25 mg total) by mouth daily.  Current Outpatient Medications (Respiratory):    albuterol (PROVENTIL) (2.5 MG/3ML) 0.083% nebulizer solution, Take 3 mLs (2.5 mg total) by nebulization every 6 (six) hours as needed for wheezing or shortness of breath.   albuterol (VENTOLIN HFA) 108 (90 Base) MCG/ACT inhaler, Inhale 1-2 puffs into the lungs every 4 (four) hours as needed for wheezing or shortness of breath.    azelastine (ASTELIN) 0.1 % nasal spray, USE 1 SPRAY IN EACH NOSTRIL TWICE DAILY   BREO ELLIPTA 100-25 MCG/ACT AEPB, INHALE 1 PUFF INTO THE LUNGS DAILY   fluticasone (FLONASE) 50 MCG/ACT nasal spray, 1 spray each nostril twice daily   fluticasone furoate-vilanterol (BREO ELLIPTA) 100-25 MCG/ACT AEPB, Inhale 1 puff into the lungs daily.  fluticasone furoate-vilanterol (BREO ELLIPTA) 100-25 MCG/INH AEPB, Inhale 1 puff into the lungs daily.   montelukast (SINGULAIR) 10 MG tablet, TAKE 1 TABLET BY MOUTH AT BEDTIME. GENERIC EQUIVALENT FOR SINGULAIR.  Current Outpatient Medications (Analgesics):    acetaminophen (TYLENOL) 500 MG tablet, Take 500 mg by mouth 2 (two) times daily.   aspirin EC 81 MG tablet, Take 81 mg by mouth daily.   Current Outpatient Medications (Other):    cholecalciferol (VITAMIN D3) 25 MCG (1000 UT) tablet, Take 1,000 Units by mouth daily.   donepezil (ARICEPT) 10 MG tablet, TAKE 1 TABLET BY MOUTH DAILY   Eyelid Cleansers (OCUSOFT EYELID CLEANSING) PADS, Place 1 application into both eyes 2 (two) times daily.   fish oil-omega-3 fatty acids 1000 MG capsule, Take 2 capsules (2 g total) by mouth 2 (two) times daily.   gabapentin (NEURONTIN) 100 MG capsule, Take 2 capsules (200 mg total) by mouth at bedtime.   gabapentin (NEURONTIN) 300 MG capsule, TAKE 1 CAPSULE(300 MG) BY MOUTH THREE TIMES DAILY   ofloxacin (OCUFLOX) 0.3 % ophthalmic solution, INSTILL 1 DROP IN LEFT EYE FOUR TIMES DAILY FOR 7 DAYS BEGINNING AFTER SURGERY   Polyvinyl Alcohol-Povidone (REFRESH OP), Place 1 drop into both eyes 2 (two) times daily.   Saw Palmetto 500 MG CAPS, Take 1,000 mg by mouth 2 (two) times daily.     Objective  Blood pressure (!) 140/60, pulse (!) 57, height 5\' 5"  (1.651 m), weight 140 lb (63.5 kg), SpO2 97 %.   General: No apparent distress alert and oriented x3 mood and affect normal, dressed appropriately.  HEENT: Pupils equal, extraocular movements intact  Respiratory: Patient's speak  in full sentences and does not appear short of breath  Cardiovascular: No lower extremity edema, non tender, no erythema  Back exam does have loss of lordosis and does have some atrophy of the lower extremities.  Patient though does not have any significant pain with tightness noted.   Nontender over the piriformis.   Impression and Recommendations:     The above documentation has been reviewed and is accurate and complete , DO

## 2022-10-15 ENCOUNTER — Ambulatory Visit: Payer: Medicare Other | Admitting: Family Medicine

## 2022-10-15 DIAGNOSIS — G5701 Lesion of sciatic nerve, right lower limb: Secondary | ICD-10-CM

## 2022-10-15 NOTE — Patient Instructions (Signed)
Good to see you  Glad you are doing well Continue to stay active Follow up in 4-5 months

## 2022-10-15 NOTE — Assessment & Plan Note (Signed)
Patient is unremarkably well with the piriformis injection previously.  Has had the hamstring difficulty as well but at the moment doing well and follow-up with me in 4 to 6 months

## 2022-11-17 ENCOUNTER — Ambulatory Visit: Payer: Medicare Other | Admitting: Family Medicine

## 2022-11-17 ENCOUNTER — Encounter: Payer: Self-pay | Admitting: Family Medicine

## 2022-11-17 VITALS — BP 126/48 | HR 59 | Ht 65.0 in | Wt 141.0 lb

## 2022-11-17 DIAGNOSIS — G3184 Mild cognitive impairment, so stated: Secondary | ICD-10-CM

## 2022-11-17 DIAGNOSIS — I10 Essential (primary) hypertension: Secondary | ICD-10-CM | POA: Diagnosis not present

## 2022-11-17 DIAGNOSIS — E7800 Pure hypercholesterolemia, unspecified: Secondary | ICD-10-CM

## 2022-11-17 LAB — TSH: TSH: 3.07 u[IU]/mL (ref 0.35–5.50)

## 2022-11-17 LAB — CBC WITH DIFFERENTIAL/PLATELET
Basophils Absolute: 0.1 10*3/uL (ref 0.0–0.1)
Basophils Relative: 0.9 % (ref 0.0–3.0)
Eosinophils Absolute: 0.2 10*3/uL (ref 0.0–0.7)
Eosinophils Relative: 2.9 % (ref 0.0–5.0)
HCT: 42 % (ref 39.0–52.0)
Hemoglobin: 13.9 g/dL (ref 13.0–17.0)
Lymphocytes Relative: 26.8 % (ref 12.0–46.0)
Lymphs Abs: 2.2 10*3/uL (ref 0.7–4.0)
MCHC: 33.2 g/dL (ref 30.0–36.0)
MCV: 87.9 fl (ref 78.0–100.0)
Monocytes Absolute: 0.8 10*3/uL (ref 0.1–1.0)
Monocytes Relative: 9.2 % (ref 3.0–12.0)
Neutro Abs: 4.9 10*3/uL (ref 1.4–7.7)
Neutrophils Relative %: 60.2 % (ref 43.0–77.0)
Platelets: 232 10*3/uL (ref 150.0–400.0)
RBC: 4.78 Mil/uL (ref 4.22–5.81)
RDW: 13.7 % (ref 11.5–15.5)
WBC: 8.2 10*3/uL (ref 4.0–10.5)

## 2022-11-17 LAB — BASIC METABOLIC PANEL
BUN: 21 mg/dL (ref 6–23)
CO2: 30 mEq/L (ref 19–32)
Calcium: 9.5 mg/dL (ref 8.4–10.5)
Chloride: 102 mEq/L (ref 96–112)
Creatinine, Ser: 1.11 mg/dL (ref 0.40–1.50)
GFR: 60.58 mL/min (ref >60.00)
Glucose, Bld: 93 mg/dL (ref 70–99)
Potassium: 4.4 mEq/L (ref 3.5–5.1)
Sodium: 139 mEq/L (ref 135–145)

## 2022-11-17 LAB — HEPATIC FUNCTION PANEL
ALT: 21 U/L (ref 0–53)
AST: 25 U/L (ref 0–37)
Albumin: 4.2 g/dL (ref 3.5–5.2)
Alkaline Phosphatase: 78 U/L (ref 39–117)
Bilirubin, Direct: 0.1 mg/dL (ref 0.0–0.3)
Total Bilirubin: 0.5 mg/dL (ref 0.2–1.2)
Total Protein: 7.3 g/dL (ref 6.0–8.3)

## 2022-11-17 LAB — LIPID PANEL
Cholesterol: 113 mg/dL (ref 0–200)
HDL: 35.6 mg/dL — ABNORMAL LOW (ref >39.00)
LDL Cholesterol: 53 mg/dL (ref 0–99)
NonHDL: 77.31
Total CHOL/HDL Ratio: 3
Triglycerides: 123 mg/dL (ref 0.0–149.0)
VLDL: 24.6 mg/dL (ref 0.0–40.0)

## 2022-11-17 MED ORDER — DONEPEZIL HCL 10 MG PO TABS: 10.0000 mg | ORAL_TABLET | Freq: Every day | ORAL | 1 refills | Status: DC

## 2022-11-17 NOTE — Progress Notes (Signed)
   Subjective:    Patient ID: [name], male    DOB: 08-08-1935, 88 y.o.   MRN: [id number]  HPI HTN- chronic problem, on Amlodipine 5mg  daily, Carvedilol 6.25mg  BID, HCTZ 25mg  daily w/ good control.  Pt reports feeling great.  No CP, SOB, HA's, visual changes, edema.  Hyperlipidemia- chronic problem, on Lipitor 80mg  daily and fish oil.  No abd pain, N/V.  Cognitive impairment- currently on Aricept 10mg  daily.  Pt reports sxs are the same.   Review of Systems For ROS see HPI     Objective:   Physical Exam Vitals reviewed.  Constitutional:      General: He is not in acute distress.    Appearance: Normal appearance. He is well-developed. He is not ill-appearing.  HENT:     Head: Normocephalic and atraumatic.  Eyes:     Extraocular Movements: Extraocular movements intact.     Conjunctiva/sclera: Conjunctivae normal.     Pupils: Pupils are equal, round, and reactive to light.  Neck:     Thyroid: No thyromegaly.  Cardiovascular:     Rate and Rhythm: Normal rate and regular rhythm.     Pulses: Normal pulses.     Heart sounds: Normal heart sounds. No murmur heard. Pulmonary:     Effort: Pulmonary effort is normal. No respiratory distress.     Breath sounds: Normal breath sounds.  Abdominal:     General: Bowel sounds are normal. There is no distension.     Palpations: Abdomen is soft.  Musculoskeletal:     Cervical back: Normal range of motion and neck supple.     Right lower leg: No edema.     Left lower leg: No edema.  Lymphadenopathy:     Cervical: No cervical adenopathy.  Skin:    General: Skin is warm and dry.  Neurological:     General: No focal deficit present.     Mental Status: He is alert and oriented to person, place, and time. Mental status is at baseline.     Cranial Nerves: No cranial nerve deficit.  Psychiatric:        Mood and Affect: Mood normal.        Behavior: Behavior normal.           Assessment & Plan:

## 2022-11-17 NOTE — Assessment & Plan Note (Signed)
Ongoing issue for pt.  He feels sxs are stable on Aricept 10mg  daily.  No med changes at this time.  Refill provided.

## 2022-11-17 NOTE — Assessment & Plan Note (Signed)
Chronic problem.  On Lipitor 80mg  nightly w/o difficulty.  Check labs.  Adjust meds prn

## 2022-11-17 NOTE — Assessment & Plan Note (Signed)
Chronic problem.  Excellent control on Amlodipine 5mg  daily, Carvedilol 6.25mg  BID, HCTZ 25mg  daily.  Currently asymptomatic and reports feeling great.  Check labs due to diuretic but no anticipated med changes.  Will follow.

## 2022-11-17 NOTE — Patient Instructions (Signed)
Schedule your complete physical in 6 months We'll notify you of your lab results and make any changes if needed Continue to work on healthy diet and regular exercise- you're doing great! Call with any questions or concerns Stay Safe!  Stay Healthy! 

## 2022-11-18 ENCOUNTER — Telehealth: Payer: Self-pay

## 2022-11-18 NOTE — Telephone Encounter (Signed)
Left VM with lab results.  

## 2022-11-18 NOTE — Telephone Encounter (Signed)
-----  Message from Midge Minium, MD sent at 11/18/2022  7:49 AM EST ----- Labs look great!  No changes at this time

## 2022-11-21 ENCOUNTER — Other Ambulatory Visit: Payer: Self-pay | Admitting: Primary Care

## 2022-12-02 ENCOUNTER — Telehealth: Payer: Self-pay | Admitting: Primary Care

## 2022-12-02 ENCOUNTER — Telehealth: Payer: Self-pay | Admitting: Pulmonary Disease

## 2022-12-02 NOTE — Telephone Encounter (Signed)
Spoke to [facility] and all they needed was a verbal to send pt Monelukast 10 MG. I gave them a verbal ok. Nothing further needed.

## 2022-12-02 NOTE — Telephone Encounter (Signed)
Pt came in because phones too long a hold. Wants a refill of montelukast (SINGULAIR) 10 MG tablet [[id number]] It was dcln by pharm. Please call to put thru. Pharm is : [facility]. In the past  we needed to give them a verbal OK to process.

## 2022-12-03 NOTE — Telephone Encounter (Signed)
Called and spoke with pt letting him know that a verbal refill for his montelukast was given to [facility] 2/6 and pt verbalized understanding stating that he received a text from them stating that they had shipped Rx. Nothing further needed.

## 2022-12-20 ENCOUNTER — Other Ambulatory Visit: Payer: Self-pay | Admitting: Cardiology

## 2022-12-22 ENCOUNTER — Other Ambulatory Visit: Payer: Self-pay

## 2022-12-22 MED ORDER — HYDROCHLOROTHIAZIDE 25 MG PO TABS: 25.0000 mg | ORAL_TABLET | Freq: Every day | ORAL | 0 refills | Status: DC

## 2022-12-22 MED ORDER — ATORVASTATIN CALCIUM 80 MG PO TABS: 80.0000 mg | ORAL_TABLET | Freq: Every day | ORAL | 0 refills | Status: DC

## 2023-01-26 ENCOUNTER — Ambulatory Visit (HOSPITAL_COMMUNITY)
Admission: RE | Admit: 2023-01-26 | Discharge: 2023-01-26 | Disposition: A | Discharge status: Home / Self Care | Payer: Medicare Other | Source: Ambulatory Visit | Attending: Internal Medicine | Admitting: Internal Medicine

## 2023-01-26 DIAGNOSIS — I6523 Occlusion and stenosis of bilateral carotid arteries: Secondary | ICD-10-CM | POA: Insufficient documentation

## 2023-01-27 ENCOUNTER — Other Ambulatory Visit: Payer: Self-pay

## 2023-01-27 ENCOUNTER — Encounter: Payer: Self-pay | Admitting: Cardiology

## 2023-01-27 DIAGNOSIS — I6523 Occlusion and stenosis of bilateral carotid arteries: Secondary | ICD-10-CM

## 2023-02-03 ENCOUNTER — Other Ambulatory Visit: Payer: Self-pay | Admitting: *Deleted

## 2023-02-03 DIAGNOSIS — I2510 Atherosclerotic heart disease of native coronary artery without angina pectoris: Secondary | ICD-10-CM

## 2023-02-03 DIAGNOSIS — I6523 Occlusion and stenosis of bilateral carotid arteries: Secondary | ICD-10-CM

## 2023-02-03 DIAGNOSIS — I10 Essential (primary) hypertension: Secondary | ICD-10-CM

## 2023-02-03 DIAGNOSIS — E7800 Pure hypercholesterolemia, unspecified: Secondary | ICD-10-CM

## 2023-02-03 MED ORDER — AMLODIPINE BESYLATE 5 MG PO TABS: 5.0000 mg | ORAL_TABLET | Freq: Every day | ORAL | 0 refills | Status: DC

## 2023-02-10 NOTE — Progress Notes (Signed)
[name] [address] [place] [address] Phone: [phone] Subjective:   [name], am serving as a scribe for Dr. Antoine [name].  I'm seeing this patient by the request  of:  Robert Hatch, MD  CC: Back pain and leg pain  [id number]  10/15/2022 Patient is unremarkably well with the piriformis injection previously. Has had the hamstring difficulty as well but at the moment doing well and follow-up with me in 4 to 6 months   Update 02/11/2023 [name] is a 88 y.o. male coming in with complaint of R piriformis pain. Patient states that his pain increases when he lies down at night. No pain during the day.  Patient is very happy with the results of fall.      Past Medical History:  Diagnosis Date   Allergy    Arthritis    "scattered" (10/26/2018)   Asthma    C. difficile diarrhea    CAD (coronary artery disease) 2005   s/p PCI of RCA   Carotid artery stenosis    1 to 39% right ICA and 40 to 59% left ICA stenosis by Dopplers 01/2022   Carotid stenosis    1 to 39% right carotid artery stenosis and 40 to 59% left carotid artery stenosis by Dopplers 01/2023   Cataract    NS OD   Cognitive impairment, mild, so stated    Colitis    Erectile dysfunction    GERD (gastroesophageal reflux disease)    Hyperlipidemia    Hypertension    Perforated stomach, acute 10/23/2018   Peripheral vascular disease    Retinal detachment    Vertebrobasilar artery syndrome    Vertigo    Past Surgical History:  Procedure Laterality Date   APPENDECTOMY  1954   CATARACT EXTRACTION Left 08/08/2019   Dr. Hazle Quant   COLONOSCOPY     ESOPHAGOGASTRODUODENOSCOPY (EGD) WITH ESOPHAGEAL DILATION  09/2018   EYE SURGERY     LAPAROTOMY N/A 10/23/2018   Procedure: EXPLORATORY LAPAROTOMY WITH GRAHAM PATCH FOR PERFORATED ULCER AND PLACEMENT OF GASTRIC TUBE;  Surgeon: Emelia Loron, MD;  Location: [facility] OR;  Service: General;  Laterality: N/A;   NASAL  SINUS SURGERY  05/2018   RETINAL DETACHMENT SURGERY     "? side"   UPPER GASTROINTESTINAL ENDOSCOPY     Social History   Socioeconomic History   Marital status: Married    Spouse name: Not on file   Number of children: 3   Years of education: Not on file   Highest education level: Not on file  Occupational History   Occupation: Retired Teaching laboratory technician: RETIRED  Tobacco Use   Smoking status: Former    Types: Cigars    Quit date: 05/13/1961    Years since quitting: 61.7    Passive exposure: Never   Smokeless tobacco: Never   Tobacco comments:    "never inhaled" in the early 60's   Vaping Use   Vaping Use: Never used  Substance and Sexual Activity   Alcohol use: Yes    Alcohol/week: 14.0 standard drinks of alcohol    Types: 14 Glasses of wine per week   Drug use: Never   Sexual activity: Not Currently  Other Topics Concern   Not on file  Social History Narrative   Married with 1 son and 2 daughters.  Retired.   2 caffeinated beverages daily, 3 glasses of wine daily   Never smoker no drug use  no tobacco   Social Determinants of Health   Financial Resource Strain: Low Risk  (05/08/2022)   Overall Financial Resource Strain (CARDIA)    Difficulty of Paying Living Expenses: Not hard at all  Food Insecurity: No Food Insecurity (05/08/2022)   [name] Vital Sign    Worried About Running Out of Food in the Last Year: Never true    Ran Out of Food in the Last Year: Never true  Transportation Needs: No Transportation Needs (05/08/2022)   PRAPARE - Administrator, Civil Service (Medical): No    Lack of Transportation (Non-Medical): No  Physical Activity: Insufficiently Active (05/08/2022)   Exercise Vital Sign    Days of Exercise per Week: 3 days    Minutes of Exercise per Session: 30 min  Stress: No Stress Concern Present (05/08/2022)   [employer] of Occupational Health - Occupational Stress Questionnaire    Feeling of Stress : Not at all   Social Connections: Moderately Integrated (05/08/2022)   Social Connection and Isolation Panel [NHANES]    Frequency of Communication with Friends and Family: Three times a week    Frequency of Social Gatherings with Friends and Family: Three times a week    Attends Religious Services: Never    Active Member of Clubs or Organizations: Yes    Attends Engineer, structural: More than 4 times per year    Marital Status: Married   Allergies  Allergen Reactions   Neosporin [Bacitracin-Polymyxin B] Other (See Comments)    Redness   Family History  Problem Relation Age of Onset   Stroke Mother    Hyperlipidemia Mother    Hypertension Mother    Deep vein thrombosis Mother    Stroke Father    Hyperlipidemia Father    Hypertension Father    Deep vein thrombosis Father    Diverticulitis Father    Ulcerative colitis Father    Colon polyps Father    Colon cancer Neg Hx    Esophageal cancer Neg Hx    Stomach cancer Neg Hx    Rectal cancer Neg Hx      Current Outpatient Medications (Cardiovascular):    amLODipine (NORVASC) 5 MG tablet, Take 1 tablet (5 mg total) by mouth daily.   atorvastatin (LIPITOR) 80 MG tablet, TAKE 1 TABLET(80 MG) BY MOUTH DAILY   atorvastatin (LIPITOR) 80 MG tablet, Take 1 tablet (80 mg total) by mouth daily. Please keep upcoming appt.with provider in June to receive future refills. Thank You.   carvedilol (COREG) 6.25 MG tablet, Take 1 tablet (6.25 mg total) by mouth 2 (two) times daily with a meal.   hydrochlorothiazide (HYDRODIURIL) 25 MG tablet, TAKE 1 TABLET(25 MG) BY MOUTH DAILY   hydrochlorothiazide (HYDRODIURIL) 25 MG tablet, Take 1 tablet (25 mg total) by mouth daily. Please keep upcoming appt.with provider in June to receive future refills. Thank You.  Current Outpatient Medications (Respiratory):    albuterol (PROVENTIL) (2.5 MG/3ML) 0.083% nebulizer solution, Take 3 mLs (2.5 mg total) by nebulization every 6 (six) hours as needed for wheezing  or shortness of breath.   albuterol (VENTOLIN HFA) 108 (90 Base) MCG/ACT inhaler, Inhale 1-2 puffs into the lungs every 4 (four) hours as needed for wheezing or shortness of breath.   azelastine (ASTELIN) 0.1 % nasal spray, USE 1 SPRAY IN EACH NOSTRIL TWICE DAILY   BREO ELLIPTA 100-25 MCG/ACT AEPB, INHALE 1 PUFF INTO THE LUNGS DAILY   fluticasone (FLONASE) 50 MCG/ACT nasal spray, 1 spray each nostril  twice daily   montelukast (SINGULAIR) 10 MG tablet, TAKE 1 TABLET BY MOUTH AT BEDTIME GENERIC EQUIVALENT FOR SINGULAIR  Current Outpatient Medications (Analgesics):    acetaminophen (TYLENOL) 500 MG tablet, Take 500 mg by mouth 2 (two) times daily.   aspirin EC 81 MG tablet, Take 81 mg by mouth daily.   Current Outpatient Medications (Other):    cholecalciferol (VITAMIN D3) 25 MCG (1000 UT) tablet, Take 1,000 Units by mouth daily.   donepezil (ARICEPT) 10 MG tablet, Take 1 tablet (10 mg total) by mouth daily.   Eyelid Cleansers (OCUSOFT EYELID CLEANSING) PADS, Place 1 application into both eyes 2 (two) times daily.   fish oil-omega-3 fatty acids 1000 MG capsule, Take 2 capsules (2 g total) by mouth 2 (two) times daily.   gabapentin (NEURONTIN) 300 MG capsule, TAKE 1 CAPSULE(300 MG) BY MOUTH THREE TIMES DAILY   ofloxacin (OCUFLOX) 0.3 % ophthalmic solution, INSTILL 1 DROP IN LEFT EYE FOUR TIMES DAILY FOR 7 DAYS BEGINNING AFTER SURGERY   Polyvinyl Alcohol-Povidone (REFRESH OP), Place 1 drop into both eyes 2 (two) times daily.   Saw Palmetto 500 MG CAPS, Take 1,000 mg by mouth 2 (two) times daily.    Objective  Blood pressure (!) 128/58, pulse 66, height  (1.651 m), weight 141 lb (64 kg), SpO2 96 %.   General: No apparent distress alert and oriented x3 mood and affect normal, dressed appropriately.  HEENT: Pupils equal, extraocular movements intact  Respiratory: Patient's speak in full sentences and does not appear short of breath  Cardiovascular: No lower extremity edema, non tender, no  erythema  Patient still has some mild tightness of the hamstrings but it seems to be bilateral.  Still some tenderness to palpation in the greater trochanteric area and mild over the piriformis.  Still has some atrophy but stable of the lower extremities.    Impression and Recommendations:    The above documentation has been reviewed and is accurate and complete Judi Saa, DO

## 2023-02-11 ENCOUNTER — Ambulatory Visit: Payer: Medicare Other | Admitting: Family Medicine

## 2023-02-11 VITALS — BP 128/58 | HR 66 | Ht 65.0 in | Wt 141.0 lb

## 2023-02-11 DIAGNOSIS — M48062 Spinal stenosis, lumbar region with neurogenic claudication: Secondary | ICD-10-CM | POA: Diagnosis not present

## 2023-02-11 NOTE — Assessment & Plan Note (Signed)
Underlying spinal stenosis but is doing relatively well.  Discussed icing regimen and home exercises, discussed which activities to do and which ones to avoid.  Patient is going to continue to work on some of the exercises.  No other significant changes at the moment.  Follow-up with me again in 4 months

## 2023-02-11 NOTE — Patient Instructions (Signed)
Glad you are doing good See me in 4 months

## 2023-02-18 ENCOUNTER — Other Ambulatory Visit: Payer: Self-pay

## 2023-02-18 MED ORDER — CARVEDILOL 6.25 MG PO TABS: 6.2500 mg | ORAL_TABLET | Freq: Two times a day (BID) | ORAL | 0 refills | Status: DC

## 2023-04-09 ENCOUNTER — Ambulatory Visit (INDEPENDENT_AMBULATORY_CARE_PROVIDER_SITE_OTHER): Payer: Medicare Other | Admitting: *Deleted

## 2023-04-09 DIAGNOSIS — Z0000 Encounter for general adult medical examination without abnormal findings: Secondary | ICD-10-CM | POA: Diagnosis not present

## 2023-04-09 NOTE — Patient Instructions (Signed)
[name] , Thank you for taking time to come for your Medicare Wellness Visit. I appreciate your ongoing commitment to your health goals. Please review the following plan we discussed and let me know if I can assist you in the future.   Screening recommendations/referrals: Colonoscopy: no longer required Recommended yearly ophthalmology/optometry visit for glaucoma screening and checkup Recommended yearly dental visit for hygiene and checkup  Vaccinations: Influenza vaccine: up to date Pneumococcal vaccine: up to date Tdap vaccine: up to date Shingles vaccine: up to date     Advanced directives: not on file yes    Preventive Care 65 Years and Older, Male Preventive care refers to lifestyle choices and visits with your health care provider that can promote health and wellness. What does preventive care include? A yearly physical exam. This is also called an annual well check. Dental exams once or twice a year. Routine eye exams. Ask your health care provider how often you should have your eyes checked. Personal lifestyle choices, including: Daily care of your teeth and gums. Regular physical activity. Eating a healthy diet. Avoiding tobacco and drug use. Limiting alcohol use. Practicing safe sex. Taking low doses of aspirin every day. Taking vitamin and mineral supplements as recommended by your health care provider. What happens during an annual well check? The services and screenings done by your health care provider during your annual well check will depend on your age, overall health, lifestyle risk factors, and family history of disease. Counseling  Your health care provider may ask you questions about your: Alcohol use. Tobacco use. Drug use. Emotional well-being. Home and relationship well-being. Sexual activity. Eating habits. History of falls. Memory and ability to understand (cognition). Work and work Astronomer. Screening  You may have the following tests  or measurements: Height, weight, and BMI. Blood pressure. Lipid and cholesterol levels. These may be checked every 5 years, or more frequently if you are over 61 years old. Skin check. Lung cancer screening. You may have this screening every year starting at age 73 if you have a 30-pack-year history of smoking and currently smoke or have quit within the past 15 years. Fecal occult blood test (FOBT) of the stool. You may have this test every year starting at age 21. Flexible sigmoidoscopy or colonoscopy. You may have a sigmoidoscopy every 5 years or a colonoscopy every 10 years starting at age 80. Prostate cancer screening. Recommendations will vary depending on your family history and other risks. Hepatitis C blood test. Hepatitis B blood test. Sexually transmitted disease (STD) testing. Diabetes screening. This is done by checking your blood sugar (glucose) after you have not eaten for a while (fasting). You may have this done every 1-3 years. Abdominal aortic aneurysm (AAA) screening. You may need this if you are a current or former smoker. Osteoporosis. You may be screened starting at age 35 if you are at high risk. Talk with your health care provider about your test results, treatment options, and if necessary, the need for more tests. Vaccines  Your health care provider may recommend certain vaccines, such as: Influenza vaccine. This is recommended every year. Tetanus, diphtheria, and acellular pertussis (Tdap, Td) vaccine. You may need a Td booster every 10 years. Zoster vaccine. You may need this after age 38. Pneumococcal 13-valent conjugate (PCV13) vaccine. One dose is recommended after age 55. Pneumococcal polysaccharide (PPSV23) vaccine. One dose is recommended after age 26. Talk to your health care provider about which screenings and vaccines you need and how often  you need them. This information is not intended to replace advice given to you by your health care provider. Make  sure you discuss any questions you have with your health care provider. Document Released: 11/09/2015 Document Revised: 07/02/2016 Document Reviewed: 08/14/2015 Elsevier Interactive Patient Education  2017 [place] Prevention in the Home Falls can cause injuries. They can happen to people of all ages. There are many things you can do to make your home safe and to help prevent falls. What can I do on the outside of my home? Regularly fix the edges of walkways and driveways and fix any cracks. Remove anything that might make you trip as you walk through a door, such as a raised step or threshold. Trim any bushes or trees on the path to your home. Use bright outdoor lighting. Clear any walking paths of anything that might make someone trip, such as rocks or tools. Regularly check to see if handrails are loose or broken. Make sure that both sides of any steps have handrails. Any raised decks and porches should have guardrails on the edges. Have any leaves, snow, or ice cleared regularly. Use sand or salt on walking paths during winter. Clean up any spills in your garage right away. This includes oil or grease spills. What can I do in the bathroom? Use night lights. Install grab bars by the toilet and in the tub and shower. Do not use towel bars as grab bars. Use non-skid mats or decals in the tub or shower. If you need to sit down in the shower, use a plastic, non-slip stool. Keep the floor dry. Clean up any water that spills on the floor as soon as it happens. Remove soap buildup in the tub or shower regularly. Attach bath mats securely with double-sided non-slip rug tape. Do not have throw rugs and other things on the floor that can make you trip. What can I do in the bedroom? Use night lights. Make sure that you have a light by your bed that is easy to reach. Do not use any sheets or blankets that are too big for your bed. They should not hang down onto the floor. Have a firm  chair that has side arms. You can use this for support while you get dressed. Do not have throw rugs and other things on the floor that can make you trip. What can I do in the kitchen? Clean up any spills right away. Avoid walking on wet floors. Keep items that you use a lot in easy-to-reach places. If you need to reach something above you, use a strong step stool that has a grab bar. Keep electrical cords out of the way. Do not use floor polish or wax that makes floors slippery. If you must use wax, use non-skid floor wax. Do not have throw rugs and other things on the floor that can make you trip. What can I do with my stairs? Do not leave any items on the stairs. Make sure that there are handrails on both sides of the stairs and use them. Fix handrails that are broken or loose. Make sure that handrails are as long as the stairways. Check any carpeting to make sure that it is firmly attached to the stairs. Fix any carpet that is loose or worn. Avoid having throw rugs at the top or bottom of the stairs. If you do have throw rugs, attach them to the floor with carpet tape. Make sure that you have a light  switch at the top of the stairs and the bottom of the stairs. If you do not have them, ask someone to add them for you. What else can I do to help prevent falls? Wear shoes that: Do not have high heels. Have rubber bottoms. Are comfortable and fit you well. Are closed at the toe. Do not wear sandals. If you use a stepladder: Make sure that it is fully opened. Do not climb a closed stepladder. Make sure that both sides of the stepladder are locked into place. Ask someone to hold it for you, if possible. Clearly mark and make sure that you can see: Any grab bars or handrails. First and last steps. Where the edge of each step is. Use tools that help you move around (mobility aids) if they are needed. These include: Canes. Walkers. Scooters. Crutches. Turn on the lights when you go  into a dark area. Replace any light bulbs as soon as they burn out. Set up your furniture so you have a clear path. Avoid moving your furniture around. If any of your floors are uneven, fix them. If there are any pets around you, be aware of where they are. Review your medicines with your doctor. Some medicines can make you feel dizzy. This can increase your chance of falling. Ask your doctor what other things that you can do to help prevent falls. This information is not intended to replace advice given to you by your health care provider. Make sure you discuss any questions you have with your health care provider. Document Released: 08/09/2009 Document Revised: 03/20/2016 Document Reviewed: 11/17/2014 Elsevier Interactive Patient Education  2017 [employer].

## 2023-04-09 NOTE — Progress Notes (Signed)
Subjective:   [name] is a 88 y.o. male who presents for Medicare Annual/Subsequent preventive examination.  I connected with  [name] on 04/09/23 by a telephone enabled telemedicine application and verified that I am speaking with the correct person using two identifiers.   I discussed the limitations of evaluation and management by telemedicine. The patient expressed understanding and agreed to proceed.  Patient location: home  Provider location: telephone/home  Confirmed all information answer by patient questionnaire was correct   Review of Systems     Cardiac Risk Factors include: advanced age (>74men, >63 women);male gender;hypertension     Objective:    Today's Vitals   There is no height or weight on file to calculate BMI.     04/09/2023    4:07 PM 05/08/2022    1:21 PM 03/11/2021    8:21 AM 03/07/2020   12:11 PM 12/21/2018    1:59 PM 10/26/2018    1:52 PM 10/23/2018   12:43 PM  Advanced Directives  Does Patient Have a Medical Advance Directive? Yes Yes Yes Yes Yes Yes Yes  Type of Advance Directive Living will Healthcare Power of [place];Living will Living will Living will;Healthcare Power of [employer] Power of [place];Living will Living will Healthcare Power of [place];Living will  Does patient want to make changes to medical advance directive?    No - Patient declined  No - Patient declined   Copy of Healthcare Power of Attorney in Chart?  No - copy requested  No - copy requested       Current Medications (verified) Outpatient Encounter Medications as of 04/09/2023  Medication Sig   acetaminophen (TYLENOL) 500 MG tablet Take 500 mg by mouth 2 (two) times daily.   albuterol (PROVENTIL) (2.5 MG/3ML) 0.083% nebulizer solution Take 3 mLs (2.5 mg total) by nebulization every 6 (six) hours as needed for wheezing or shortness of breath.   albuterol (VENTOLIN HFA) 108 (90 Base) MCG/ACT inhaler Inhale 1-2 puffs into the lungs every 4 (four) hours as  needed for wheezing or shortness of breath.   amLODipine (NORVASC) 5 MG tablet Take 1 tablet (5 mg total) by mouth daily.   aspirin EC 81 MG tablet Take 81 mg by mouth daily.   atorvastatin (LIPITOR) 80 MG tablet TAKE 1 TABLET(80 MG) BY MOUTH DAILY   atorvastatin (LIPITOR) 80 MG tablet Take 1 tablet (80 mg total) by mouth daily. Please keep upcoming appt.with provider in June to receive future refills. Thank You.   azelastine (ASTELIN) 0.1 % nasal spray USE 1 SPRAY IN EACH NOSTRIL TWICE DAILY   BREO ELLIPTA 100-25 MCG/ACT AEPB INHALE 1 PUFF INTO THE LUNGS DAILY   carvedilol (COREG) 6.25 MG tablet Take 1 tablet (6.25 mg total) by mouth 2 (two) times daily with a meal. Please keep upcoming appt for future refills Thanks   cholecalciferol (VITAMIN D3) 25 MCG (1000 UT) tablet Take 1,000 Units by mouth daily.   donepezil (ARICEPT) 10 MG tablet Take 1 tablet (10 mg total) by mouth daily.   Eyelid Cleansers (OCUSOFT EYELID CLEANSING) PADS Place 1 application into both eyes 2 (two) times daily.   fish oil-omega-3 fatty acids 1000 MG capsule Take 2 capsules (2 g total) by mouth 2 (two) times daily.   fluticasone (FLONASE) 50 MCG/ACT nasal spray 1 spray each nostril twice daily   gabapentin (NEURONTIN) 300 MG capsule TAKE 1 CAPSULE(300 MG) BY MOUTH THREE TIMES DAILY   hydrochlorothiazide (HYDRODIURIL) 25 MG tablet TAKE 1 TABLET(25 MG) BY MOUTH  DAILY   hydrochlorothiazide (HYDRODIURIL) 25 MG tablet Take 1 tablet (25 mg total) by mouth daily. Please keep upcoming appt.with provider in June to receive future refills. Thank You.   montelukast (SINGULAIR) 10 MG tablet TAKE 1 TABLET BY MOUTH AT BEDTIME GENERIC EQUIVALENT FOR SINGULAIR   ofloxacin (OCUFLOX) 0.3 % ophthalmic solution INSTILL 1 DROP IN LEFT EYE FOUR TIMES DAILY FOR 7 DAYS BEGINNING AFTER SURGERY   Polyvinyl Alcohol-Povidone (REFRESH OP) Place 1 drop into both eyes 2 (two) times daily.   Saw Palmetto 500 MG CAPS Take 1,000 mg by mouth 2 (two) times  daily.    No facility-administered encounter medications on file as of 04/09/2023.    Allergies (verified) Neosporin [bacitracin-polymyxin b]   History: Past Medical History:  Diagnosis Date   Allergy    Arthritis    "scattered" (10/26/2018)   Asthma    C. difficile diarrhea    CAD (coronary artery disease) 2005   s/p PCI of RCA   Carotid artery stenosis    1 to 39% right ICA and 40 to 59% left ICA stenosis by Dopplers 01/2022   Carotid stenosis    1 to 39% right carotid artery stenosis and 40 to 59% left carotid artery stenosis by Dopplers 01/2023   Cataract    NS OD   Cognitive impairment, mild, so stated    Colitis    Erectile dysfunction    GERD (gastroesophageal reflux disease)    Hyperlipidemia    Hypertension    Perforated stomach, acute 10/23/2018   Peripheral vascular disease (HCC)    Retinal detachment    Vertebrobasilar artery syndrome    Vertigo    Past Surgical History:  Procedure Laterality Date   APPENDECTOMY  1954   CATARACT EXTRACTION Left 08/08/2019   Dr. Hazle Quant   COLONOSCOPY     ESOPHAGOGASTRODUODENOSCOPY (EGD) WITH ESOPHAGEAL DILATION  09/2018   EYE SURGERY     LAPAROTOMY N/A 10/23/2018   Procedure: EXPLORATORY LAPAROTOMY WITH GRAHAM PATCH FOR PERFORATED ULCER AND PLACEMENT OF GASTRIC TUBE;  Surgeon: Emelia Loron, MD;  Location: [facility] OR;  Service: General;  Laterality: N/A;   NASAL SINUS SURGERY  05/2018   RETINAL DETACHMENT SURGERY     "? side"   UPPER GASTROINTESTINAL ENDOSCOPY     Family History  Problem Relation Age of Onset   Stroke Mother    Hyperlipidemia Mother    Hypertension Mother    Deep vein thrombosis Mother    Stroke Father    Hyperlipidemia Father    Hypertension Father    Deep vein thrombosis Father    Diverticulitis Father    Ulcerative colitis Father    Colon polyps Father    Colon cancer Neg Hx    Esophageal cancer Neg Hx    Stomach cancer Neg Hx    Rectal cancer Neg Hx    Social History   Socioeconomic  History   Marital status: Married    Spouse name: Not on file   Number of children: 3   Years of education: Not on file   Highest education level: Not on file  Occupational History   Occupation: Retired Teaching laboratory technician: RETIRED  Tobacco Use   Smoking status: Former    Types: Cigars    Quit date: 05/13/1961    Years since quitting: 61.9    Passive exposure: Never   Smokeless tobacco: Never   Tobacco comments:    "never inhaled" in the early 60's   Vaping  Use   Vaping Use: Never used  Substance and Sexual Activity   Alcohol use: Yes    Alcohol/week: 14.0 standard drinks of alcohol    Types: 14 Glasses of wine per week   Drug use: Never   Sexual activity: Not Currently  Other Topics Concern   Not on file  Social History Narrative   Married with 1 son and 2 daughters.  Retired.   2 caffeinated beverages daily, 3 glasses of wine daily   Never smoker no drug use no tobacco   Social Determinants of Health   Financial Resource Strain: Low Risk  (04/09/2023)   Overall Financial Resource Strain (CARDIA)    Difficulty of Paying Living Expenses: Not hard at all  Food Insecurity: No Food Insecurity (04/09/2023)   Hunger Vital Sign    Worried About Running Out of Food in the Last Year: Never true    Ran Out of Food in the Last Year: Never true  Transportation Needs: No Transportation Needs (04/09/2023)   PRAPARE - Administrator, Civil Service (Medical): No    Lack of Transportation (Non-Medical): No  Physical Activity: Insufficiently Active (04/09/2023)   Exercise Vital Sign    Days of Exercise per Week: 3 days    Minutes of Exercise per Session: 30 min  Stress: No Stress Concern Present (04/09/2023)   [employer] of Occupational Health - Occupational Stress Questionnaire    Feeling of Stress : Not at all  Social Connections: Moderately Integrated (04/09/2023)   Social Connection and Isolation Panel [NHANES]    Frequency of Communication with  Friends and Family: More than three times a week    Frequency of Social Gatherings with Friends and Family: Three times a week    Attends Religious Services: Never    Active Member of Clubs or Organizations: Yes    Attends Engineer, structural: More than 4 times per year    Marital Status: Married    Tobacco Counseling Counseling given: Not Answered Tobacco comments: "never inhaled" in the early 60's    Clinical Intake:  Pre-visit preparation completed: Yes  Pain : No/denies pain     Diabetes: No  How often do you need to have someone help you when you read instructions, pamphlets, or other written materials from your doctor or pharmacy?: 1 - Never  Diabetic?  no  Interpreter Needed?: No  Information entered by :: Remi Haggard LPN   Activities of Daily Living    04/09/2023    4:03 PM 04/07/2023    1:16 PM  In your present state of health, do you have any difficulty performing the following activities:  Hearing? 0 0  Vision? 0 0  Difficulty concentrating or making decisions? 0 0  Walking or climbing stairs? 0 0  Dressing or bathing? 0 0  Doing errands, shopping? 0 0  Preparing Food and eating ? N N  Using the Toilet? N N  In the past six months, have you accidently leaked urine? N N  Do you have problems with loss of bowel control? N N  Managing your Medications? N N  Managing your Finances? N N  Housekeeping or managing your Housekeeping? N N    Patient Care Team: Sheliah Hatch, MD as PCP - General Quintella Reichert, MD as PCP - Cardiology (Cardiology) Quintella Reichert, MD as Consulting Physician (Cardiology) Chuck Hint, MD as Consulting Physician (Vascular Surgery) Jerilee Field, MD as Consulting Physician (Urology) Iva Boop, MD as  Consulting Physician (Gastroenterology) Josephine Igo, DO as Consulting Physician (Pulmonary Disease) Jamal Collin, MD as Consulting Physician (Ophthalmology) Erroll Luna,  [facility] (Pharmacist)  Indicate any recent Medical Services you may have received from other than [facility] providers in the past year (date may be approximate).     Assessment:   This is a routine wellness examination for [name].  Hearing/Vision screen Hearing Screening - Comments:: No trouble hearing Vision Screening - Comments:: Up to date Unsure of name  Dietary issues and exercise activities discussed: Current Exercise Habits: Home exercise routine, Time (Minutes): 15, Frequency (Times/Week): 3, Weekly Exercise (Minutes/Week): 45, Intensity: Mild   Goals Addressed             This Visit's Progress    Patient Stated       Continue current lifestyle       Depression Screen    04/09/2023    4:11 PM 11/17/2022   10:18 AM 05/12/2022    9:49 AM 05/08/2022    1:23 PM 05/08/2022    1:20 PM 11/01/2021   10:31 AM 05/03/2021   10:03 AM  PHQ 2/9 Scores  PHQ - 2 Score 0 0 0 0 0 0 0  PHQ- 9 Score 0  0   1 0    Fall Risk    04/09/2023    4:01 PM 04/07/2023    1:16 PM 11/17/2022   10:18 AM 05/12/2022    9:49 AM 05/08/2022    1:23 PM  Fall Risk   Falls in the past year? 1 1 0 0 0  Number falls in past yr: 1 0 0 0 0  Injury with Fall? 0 0 0 0 0  Risk for fall due to :   No Fall Risks No Fall Risks No Fall Risks  Follow up Falls evaluation completed;Education provided;Falls prevention discussed  Falls evaluation completed Falls evaluation completed Falls evaluation completed;Education provided    FALL RISK PREVENTION PERTAINING TO THE HOME:  Any stairs in or around the home? Yes  If so, are there any without handrails? No  Home free of loose throw rugs in walkways, pet beds, electrical cords, etc? Yes  Adequate lighting in your home to reduce risk of falls? Yes   ASSISTIVE DEVICES UTILIZED TO PREVENT FALLS:  Life alert? No  Use of a cane, walker or w/c? No  Grab bars in the bathroom? Yes  Shower chair or bench in shower? No  Elevated toilet seat or a handicapped toilet? No   TIMED  UP AND GO:  Was the test performed? No .    Cognitive Function:        04/09/2023    4:07 PM 03/11/2021    8:28 AM 03/07/2020   12:12 PM  6CIT Screen  What Year? 0 points 0 points 0 points  What month? 0 points 0 points 0 points  What time? 0 points 0 points 0 points  Count back from 20 0 points 0 points 0 points  Months in reverse 0 points 0 points 0 points  Repeat phrase 0 points 0 points 2 points  Total Score 0 points 0 points 2 points    Immunizations Immunization History  Administered Date(s) Administered   Fluad Quad(high Dose 65+) 06/24/2019   Influenza Split 08/25/2011, 08/09/2012   Influenza Whole 07/11/2010   Influenza, High Dose Seasonal PF 08/14/2014, 07/16/2018   Influenza,inj,Quad PF,6+ Mos 08/11/2013, 08/18/2014, 07/11/2015, 07/14/2016, 07/20/2017   Influenza-Unspecified 07/24/2000, 07/27/2022   PFIZER(Purple Top)SARS-COV-2 Vaccination 11/15/2019, 12/06/2019,  07/27/2020, 03/08/2021   Pneumococcal Conjugate-13 01/25/2015   Pneumococcal Polysaccharide-23 09/15/2002   Td 12/13/2007, 05/31/2019   Zoster Recombinat (Shingrix) 05/29/2020, 08/14/2020   Zoster, Live 10/29/2010    TDAP status: Up to date  Flu Vaccine status: Up to date  Pneumococcal vaccine status: Up to date  Covid-19 vaccine status: Information provided on how to obtain vaccines.   Qualifies for Shingles Vaccine? No   Zostavax completed Yes   Shingrix Completed?: Yes  Screening Tests Health Maintenance  Topic Date Due   COVID-19 Vaccine (5 - 2023-24 season) 04/25/2023 (Originally 06/27/2022)   INFLUENZA VACCINE  05/28/2023   Medicare Annual Wellness (AWV)  04/08/2024   DTaP/Tdap/Td (3 - Tdap) 05/30/2029   Pneumonia Vaccine 68+ Years old  Completed   Zoster Vaccines- Shingrix  Completed   HPV VACCINES  Aged Out    Health Maintenance  There are no preventive care reminders to display for this patient.   Colorectal cancer screening: No longer required.   Lung Cancer Screening:  (Low Dose CT Chest recommended if Age 68-80 years, 30 pack-year currently smoking OR have quit w/in 15years.)  qualify.   Lung Cancer Screening Referral:   Additional Screening:  Hepatitis C Screening: does not qualify;   Vision Screening: Recommended annual ophthalmology exams for early detection of glaucoma and other disorders of the eye. Is the patient up to date with their annual eye exam?  Yes  Who is the provider or what is the name of the office in which the patient attends annual eye exams? Unsure of name If pt is not established with a provider, would they like to be referred to a provider to establish care? No .   Dental Screening: Recommended annual dental exams for proper oral hygiene  Community Resource Referral / Chronic Care Management: CRR required this visit?  No   CCM required this visit?  No      Plan:     I have personally reviewed and noted the following in the patient's chart:   Medical and social history Use of alcohol, tobacco or illicit drugs  Current medications and supplements including opioid prescriptions. Patient is not currently taking opioid prescriptions. Functional ability and status Nutritional status Physical activity Advanced directives List of other physicians Hospitalizations, surgeries, and ER visits in previous 12 months Vitals Screenings to include cognitive, depression, and falls Referrals and appointments  In addition, I have reviewed and discussed with patient certain preventive protocols, quality metrics, and best practice recommendations. A written personalized care plan for preventive services as well as general preventive health recommendations were provided to patient.     Remi Haggard, LPN   [id number]/[id number]   Nurse Notes:

## 2023-04-21 ENCOUNTER — Other Ambulatory Visit: Payer: Self-pay | Admitting: *Deleted

## 2023-04-21 MED ORDER — CARVEDILOL 6.25 MG PO TABS: 6.2500 mg | ORAL_TABLET | Freq: Two times a day (BID) | ORAL | 0 refills | Status: DC

## 2023-04-23 ENCOUNTER — Ambulatory Visit: Payer: Medicare Other | Attending: Cardiology | Admitting: Cardiology

## 2023-04-23 ENCOUNTER — Encounter: Payer: Self-pay | Admitting: Cardiology

## 2023-04-23 VITALS — BP 136/70 | HR 60 | Ht 65.0 in | Wt 144.8 lb

## 2023-04-23 DIAGNOSIS — I6523 Occlusion and stenosis of bilateral carotid arteries: Secondary | ICD-10-CM | POA: Diagnosis not present

## 2023-04-23 DIAGNOSIS — I10 Essential (primary) hypertension: Secondary | ICD-10-CM

## 2023-04-23 DIAGNOSIS — E7800 Pure hypercholesterolemia, unspecified: Secondary | ICD-10-CM | POA: Diagnosis not present

## 2023-04-23 DIAGNOSIS — I2510 Atherosclerotic heart disease of native coronary artery without angina pectoris: Secondary | ICD-10-CM | POA: Diagnosis not present

## 2023-04-23 DIAGNOSIS — R011 Cardiac murmur, unspecified: Secondary | ICD-10-CM

## 2023-04-23 NOTE — Progress Notes (Signed)
Cardiology Office Note:    Date:  04/23/2023   ID:  [name], DOB 05/06/1935, MRN [id number]  PCP:  Sheliah Hatch, MD  Cardiologist:  Armanda Magic, MD    Referring MD: Sheliah Hatch, MD   Chief Complaint  Patient presents with   Coronary Artery Disease   Hypertension   Hyperlipidemia    History of Present Illness:    [name] is a 88 y.o. male with a hx of  ASCAD s/p PCI of the RCA, HTN, Carotid artery stenosis and dyslipidemia.  He is here today for followup and is doing well.  He denies any chest pain or pressure, SOB, DOE, PND, orthopnea, LE edema, dizziness, palpitations or syncope. He is compliant with his meds and is tolerating meds with no SE.  He walks up and down the stairs for exercise.    Past Medical History:  Diagnosis Date   Allergy    Arthritis    "scattered" (10/26/2018)   Asthma    C. difficile diarrhea    CAD (coronary artery disease) 2005   s/p PCI of RCA   Carotid stenosis    1 to 39% right carotid artery stenosis and 40 to 59% left carotid artery stenosis by Dopplers 01/2023   Cataract    NS OD   Cognitive impairment, mild, so stated    Colitis    Erectile dysfunction    GERD (gastroesophageal reflux disease)    Hyperlipidemia    Hypertension    Perforated stomach, acute 10/23/2018   Peripheral vascular disease (HCC)    Retinal detachment    Vertebrobasilar artery syndrome    Vertigo     Past Surgical History:  Procedure Laterality Date   APPENDECTOMY  1954   CATARACT EXTRACTION Left 08/08/2019   Dr. Hazle Quant   COLONOSCOPY     ESOPHAGOGASTRODUODENOSCOPY (EGD) WITH ESOPHAGEAL DILATION  09/2018   EYE SURGERY     LAPAROTOMY N/A 10/23/2018   Procedure: EXPLORATORY LAPAROTOMY WITH GRAHAM PATCH FOR PERFORATED ULCER AND PLACEMENT OF GASTRIC TUBE;  Surgeon: Emelia Loron, MD;  Location: [facility] OR;  Service: General;  Laterality: N/A;   NASAL SINUS SURGERY  05/2018   RETINAL DETACHMENT SURGERY     "? side"   UPPER  GASTROINTESTINAL ENDOSCOPY      Current Medications: Current Meds  Medication Sig   acetaminophen (TYLENOL) 500 MG tablet Take 500 mg by mouth 2 (two) times daily.   albuterol (PROVENTIL) (2.5 MG/3ML) 0.083% nebulizer solution Take 3 mLs (2.5 mg total) by nebulization every 6 (six) hours as needed for wheezing or shortness of breath.   albuterol (VENTOLIN HFA) 108 (90 Base) MCG/ACT inhaler Inhale 1-2 puffs into the lungs every 4 (four) hours as needed for wheezing or shortness of breath.   amLODipine (NORVASC) 5 MG tablet Take 1 tablet (5 mg total) by mouth daily.   aspirin EC 81 MG tablet Take 81 mg by mouth daily.   atorvastatin (LIPITOR) 80 MG tablet TAKE 1 TABLET(80 MG) BY MOUTH DAILY   atorvastatin (LIPITOR) 80 MG tablet Take 1 tablet (80 mg total) by mouth daily. Please keep upcoming appt.with provider in June to receive future refills. Thank You.   azelastine (ASTELIN) 0.1 % nasal spray USE 1 SPRAY IN EACH NOSTRIL TWICE DAILY   BREO ELLIPTA 100-25 MCG/ACT AEPB INHALE 1 PUFF INTO THE LUNGS DAILY   carvedilol (COREG) 6.25 MG tablet Take 1 tablet (6.25 mg total) by mouth 2 (two) times daily with a meal. Please keep upcoming  appt for future refills Thanks   cholecalciferol (VITAMIN D3) 25 MCG (1000 UT) tablet Take 1,000 Units by mouth daily.   donepezil (ARICEPT) 10 MG tablet Take 1 tablet (10 mg total) by mouth daily.   Eyelid Cleansers (OCUSOFT EYELID CLEANSING) PADS Place 1 application into both eyes 2 (two) times daily.   fish oil-omega-3 fatty acids 1000 MG capsule Take 2 capsules (2 g total) by mouth 2 (two) times daily.   fluticasone (FLONASE) 50 MCG/ACT nasal spray 1 spray each nostril twice daily   gabapentin (NEURONTIN) 300 MG capsule TAKE 1 CAPSULE(300 MG) BY MOUTH THREE TIMES DAILY   hydrochlorothiazide (HYDRODIURIL) 25 MG tablet TAKE 1 TABLET(25 MG) BY MOUTH DAILY   hydrochlorothiazide (HYDRODIURIL) 25 MG tablet Take 1 tablet (25 mg total) by mouth daily. Please keep upcoming  appt.with provider in June to receive future refills. Thank You.   montelukast (SINGULAIR) 10 MG tablet TAKE 1 TABLET BY MOUTH AT BEDTIME GENERIC EQUIVALENT FOR SINGULAIR   Polyvinyl Alcohol-Povidone (REFRESH OP) Place 1 drop into both eyes 2 (two) times daily.   Saw Palmetto 500 MG CAPS Take 1,000 mg by mouth 2 (two) times daily.      Allergies:   Neosporin [bacitracin-polymyxin b]   Social History   Socioeconomic History   Marital status: Married    Spouse name: Not on file   Number of children: 3   Years of education: Not on file   Highest education level: Not on file  Occupational History   Occupation: Retired Teaching laboratory technician: RETIRED  Tobacco Use   Smoking status: Former    Types: Cigars    Quit date: 05/13/1961    Years since quitting: 61.9    Passive exposure: Never   Smokeless tobacco: Never   Tobacco comments:    "never inhaled" in the early 60's   Vaping Use   Vaping Use: Never used  Substance and Sexual Activity   Alcohol use: Yes    Alcohol/week: 14.0 standard drinks of alcohol    Types: 14 Glasses of wine per week   Drug use: Never   Sexual activity: Not Currently  Other Topics Concern   Not on file  Social History Narrative   Married with 1 son and 2 daughters.  Retired.   2 caffeinated beverages daily, 3 glasses of wine daily   Never smoker no drug use no tobacco   Social Determinants of Health   Financial Resource Strain: Low Risk  (04/09/2023)   Overall Financial Resource Strain (CARDIA)    Difficulty of Paying Living Expenses: Not hard at all  Food Insecurity: No Food Insecurity (04/09/2023)   Hunger Vital Sign    Worried About Running Out of Food in the Last Year: Never true    Ran Out of Food in the Last Year: Never true  Transportation Needs: No Transportation Needs (04/09/2023)   PRAPARE - Administrator, Civil Service (Medical): No    Lack of Transportation (Non-Medical): No  Physical Activity: Insufficiently  Active (04/09/2023)   Exercise Vital Sign    Days of Exercise per Week: 3 days    Minutes of Exercise per Session: 30 min  Stress: No Stress Concern Present (04/09/2023)   [employer] of Occupational Health - Occupational Stress Questionnaire    Feeling of Stress : Not at all  Social Connections: Moderately Integrated (04/09/2023)   Social Connection and Isolation Panel [NHANES]    Frequency of Communication with Friends and Family: More  than three times a week    Frequency of Social Gatherings with Friends and Family: Three times a week    Attends Religious Services: Never    Active Member of Clubs or Organizations: Yes    Attends Engineer, structural: More than 4 times per year    Marital Status: Married     Family History: The patient's family history includes Colon polyps in his father; Deep vein thrombosis in his father and mother; Diverticulitis in his father; Hyperlipidemia in his father and mother; Hypertension in his father and mother; Stroke in his father and mother; Ulcerative colitis in his father. There is no history of Colon cancer, Esophageal cancer, Stomach cancer, or Rectal cancer.  ROS:   Please see the history of present illness.    ROS  All other systems reviewed and negative.   EKGs/Labs/Other Studies Reviewed:    The following studies were reviewed today: EKG Interpretation Date/Time:  Thursday April 23 2023 14:07:43 EDT Ventricular Rate:  58 PR Interval:  230 QRS Duration:  88 QT Interval:  402 QTC Calculation: 394 R Axis:   -24  Text Interpretation: Sinus bradycardia with 1st degree A-V block Old septal infarct When compared with ECG of 23-Oct-2018 12:36, PREVIOUS ECG IS PRESENT No significant change since last tracing Confirmed by Armanda Magic [phone]) on 04/23/2023 2:25:25 PM    Recent Labs: 11/17/2022: ALT 21; BUN 21; Creatinine, Ser 1.11; Hemoglobin 13.9; Platelets 232.0; Potassium 4.4; Sodium 139; TSH 3.07   Recent Lipid Panel     Component Value Date/Time   CHOL 113 11/17/2022 1045   CHOL 89 (L) 09/27/2019 0810   TRIG 123.0 11/17/2022 1045   HDL 35.60 (L) 11/17/2022 1045   HDL 34 (L) 09/27/2019 0810   CHOLHDL 3 11/17/2022 1045   VLDL 24.6 11/17/2022 1045   LDLCALC 53 11/17/2022 1045   LDLCALC 59 11/02/2020 1428    Physical Exam:    VS:  BP 136/70   Pulse 60   Ht 5\' 5"  (1.651 m)   Wt 144 lb 12.8 oz (65.7 kg)   SpO2 97%   BMI 24.10 kg/m     Wt Readings from Last 3 Encounters:  04/23/23 144 lb 12.8 oz (65.7 kg)  02/11/23 141 lb (64 kg)  11/17/22 141 lb (64 kg)    GEN: Well nourished, well developed in no acute distress HEENT: Normal NECK: No JVD; No carotid bruits LYMPHATICS: No lymphadenopathy CARDIAC:RRR, no murmurs, rubs, gallops RESPIRATORY:  Clear to auscultation without rales, wheezing or rhonchi  ABDOMEN: Soft, non-tender, non-distended MUSCULOSKELETAL:  No edema; No deformity  SKIN: Warm and dry NEUROLOGIC:  Alert and oriented x 3 PSYCHIATRIC:  Normal affect  ASSESSMENT:    1. Coronary artery disease involving native coronary artery of native heart without angina pectoris   2. Essential hypertension   3. Bilateral carotid artery stenosis   4. Pure hypercholesterolemia   5. Heart murmur     PLAN:    In order of problems listed above:  1.  ASCAD -s/p PCI of the RCA -He has not had any anginal symptoms since I saw him last -Continue prescription drug management with aspirin 81 mg daily, atorvastatin 80 mg daily, carvedilol 6.25 mg twice daily with as needed refills  2.  HTN -Is adequately controlled on exam today -Continue prescription drug agement with amlodipine 5 mg daily, carvedilol 6.25 mg twice daily, HCTZ 25 mg daily with as needed refills -I have personally reviewed and interpreted outside labs performed by patient's  PCP which showed serum creatinine 1.11 and potassium 4.4 on 11/17/2022  3.  Bilateral carotid artery stenosis -dopplers 4/24 showed 1-39% right and 40-59%  left carotid stenosis -repeat dopplers 01/2024 -continue ASA 81mg  daily and statin  4.  HLD -LDL goal < 70. -I have personally reviewed and interpreted outside labs performed by patient's PCP which showed LDL 53 and HDL 35 on 11/17/2022 -Continue prescription drug management atorvastatin 80 mg daily with as needed refills  5.  Heart murmur -2D echo 10/2014 showed mild TR -murmur is harsh systolic murmur at LLSB to apex -check 2D echo   Medication Adjustments/Labs and Tests Ordered: Current medicines are reviewed at length with the patient today.  Concerns regarding medicines are outlined above.  Orders Placed This Encounter  Procedures   EKG 12-Lead   No orders of the defined types were placed in this encounter.   Signed, Armanda Magic, MD  04/23/2023 2:24 PM    [facility]

## 2023-04-23 NOTE — Addendum Note (Signed)
Addended by: Luellen Pucker on: 04/23/2023 02:29 PM   Modules accepted: Orders

## 2023-04-23 NOTE — Patient Instructions (Signed)
Medication Instructions:  Your physician recommends that you continue on your current medications as directed. Please refer to the Current Medication list given to you today.  *If you need a refill on your cardiac medications before your next appointment, please call your pharmacy*   Lab Work: None.  If you have labs (blood work) drawn today and your tests are completely normal, you will receive your results only by: MyChart Message (if you have MyChart) OR A paper copy in the mail If you have any lab test that is abnormal or we need to change your treatment, we will call you to review the results.   Testing/Procedures: Your physician has requested that you have an echocardiogram. Echocardiography is a painless test that uses sound waves to create images of your heart. It provides your doctor with information about the size and shape of your heart and how well your heart's chambers and valves are working. This procedure takes approximately one hour. There are no restrictions for this procedure. Please do NOT wear cologne, perfume, aftershave, or lotions (deodorant is allowed). Please arrive 15 minutes prior to your appointment time.    Follow-Up:   Your next appointment:   1 year(s)  Provider:   Armanda Magic, MD     Other Instructions You are also in our system to have a repeat carotid scan in April of 2025. Someone will call you to schedule this early next year.

## 2023-05-01 ENCOUNTER — Other Ambulatory Visit: Payer: Self-pay

## 2023-05-01 DIAGNOSIS — I10 Essential (primary) hypertension: Secondary | ICD-10-CM

## 2023-05-01 DIAGNOSIS — E7800 Pure hypercholesterolemia, unspecified: Secondary | ICD-10-CM

## 2023-05-01 DIAGNOSIS — I2510 Atherosclerotic heart disease of native coronary artery without angina pectoris: Secondary | ICD-10-CM

## 2023-05-01 DIAGNOSIS — I6523 Occlusion and stenosis of bilateral carotid arteries: Secondary | ICD-10-CM

## 2023-05-01 MED ORDER — AMLODIPINE BESYLATE 5 MG PO TABS
5.0000 mg | ORAL_TABLET | Freq: Every day | ORAL | 3 refills | Status: DC
Start: 2023-05-01 — End: 2024-05-16

## 2023-05-09 ENCOUNTER — Other Ambulatory Visit: Payer: Self-pay | Admitting: Pulmonary Disease

## 2023-05-14 ENCOUNTER — Encounter: Payer: Self-pay | Admitting: Family Medicine

## 2023-05-14 ENCOUNTER — Ambulatory Visit (INDEPENDENT_AMBULATORY_CARE_PROVIDER_SITE_OTHER): Payer: Medicare Other | Admitting: Family Medicine

## 2023-05-14 VITALS — BP 138/58 | HR 67 | Temp 98.2°F | Resp 18 | Ht 65.0 in | Wt 142.4 lb

## 2023-05-14 DIAGNOSIS — Z0000 Encounter for general adult medical examination without abnormal findings: Secondary | ICD-10-CM | POA: Diagnosis not present

## 2023-05-14 DIAGNOSIS — I10 Essential (primary) hypertension: Secondary | ICD-10-CM | POA: Diagnosis not present

## 2023-05-14 DIAGNOSIS — G3184 Mild cognitive impairment, so stated: Secondary | ICD-10-CM

## 2023-05-14 LAB — LIPID PANEL
Cholesterol: 123 mg/dL (ref 0–200)
HDL: 42.7 mg/dL (ref >39.00)
LDL Cholesterol: 64 mg/dL (ref 0–99)
NonHDL: 80.32
Total CHOL/HDL Ratio: 3
Triglycerides: 83 mg/dL (ref 0.0–149.0)
VLDL: 16.6 mg/dL (ref 0.0–40.0)

## 2023-05-14 LAB — CBC WITH DIFFERENTIAL/PLATELET
Basophils Absolute: 0 10*3/uL (ref 0.0–0.1)
Basophils Relative: 0.4 % (ref 0.0–3.0)
Eosinophils Absolute: 0.1 10*3/uL (ref 0.0–0.7)
Eosinophils Relative: 0.8 % (ref 0.0–5.0)
HCT: 42.6 % (ref 39.0–52.0)
Hemoglobin: 13.9 g/dL (ref 13.0–17.0)
Lymphocytes Relative: 18.4 % (ref 12.0–46.0)
Lymphs Abs: 2.3 10*3/uL (ref 0.7–4.0)
MCHC: 32.6 g/dL (ref 30.0–36.0)
MCV: 89.7 fl (ref 78.0–100.0)
Monocytes Absolute: 1.4 10*3/uL — ABNORMAL HIGH (ref 0.1–1.0)
Monocytes Relative: 11.3 % (ref 3.0–12.0)
Neutro Abs: 8.5 10*3/uL — ABNORMAL HIGH (ref 1.4–7.7)
Neutrophils Relative %: 69.1 % (ref 43.0–77.0)
RBC: 4.75 Mil/uL (ref 4.22–5.81)
RDW: 14 % (ref 11.5–15.5)
WBC: 12.4 10*3/uL — ABNORMAL HIGH (ref 4.0–10.5)

## 2023-05-14 LAB — BASIC METABOLIC PANEL
BUN: 23 mg/dL (ref 6–23)
CO2: 30 mEq/L (ref 19–32)
Calcium: 9.8 mg/dL (ref 8.4–10.5)
Chloride: 102 mEq/L (ref 96–112)
Creatinine, Ser: 1.02 mg/dL (ref 0.40–1.50)
GFR: 66.82 mL/min (ref >60.00)
Glucose, Bld: 90 mg/dL (ref 70–99)
Potassium: 4.1 mEq/L (ref 3.5–5.1)
Sodium: 141 mEq/L (ref 135–145)

## 2023-05-14 LAB — HEPATIC FUNCTION PANEL
ALT: 23 U/L (ref 0–53)
AST: 24 U/L (ref 0–37)
Albumin: 4.3 g/dL (ref 3.5–5.2)
Alkaline Phosphatase: 76 U/L (ref 39–117)
Bilirubin, Direct: 0.2 mg/dL (ref 0.0–0.3)
Total Bilirubin: 0.7 mg/dL (ref 0.2–1.2)
Total Protein: 7.4 g/dL (ref 6.0–8.3)

## 2023-05-14 LAB — TSH: TSH: 2.75 u[IU]/mL (ref 0.35–5.50)

## 2023-05-14 MED ORDER — DONEPEZIL HCL 10 MG PO TABS: 10.0000 mg | ORAL_TABLET | Freq: Every day | ORAL | 1 refills | Status: DC

## 2023-05-14 NOTE — Assessment & Plan Note (Signed)
Pt's PE WNL and unchanged from previous.  No longer doing health screenings.  Check labs.  Anticipatory guidance provided.

## 2023-05-14 NOTE — Progress Notes (Signed)
   Subjective:    Patient ID: [name], male    DOB: [birth year], 88 y.o.   MRN: [id number]  HPI CPE- UTD on Tdap, PNA.  Pt reports feeling well  Patient Care Team    Relationship Specialty Notifications Start End  Sheliah Hatch, MD PCP - General   10/09/10   Quintella Reichert, MD PCP - Cardiology Cardiology Admissions 04/22/18   Quintella Reichert, MD Consulting Physician Cardiology  09/13/14   Chuck Hint, MD Consulting Physician Vascular Surgery  03/22/15   Jerilee Field, MD Consulting Physician Urology  04/14/18   Iva Boop, MD Consulting Physician Gastroenterology  04/14/18   Josephine Igo, DO Consulting Physician Pulmonary Disease  11/11/19   Jamal Collin, MD Consulting Physician Ophthalmology  03/07/20   Erroll Luna, [facility] (Inactive)  Pharmacist  12/11/21    Comment: [phone]    Health Maintenance  Topic Date Due   INFLUENZA VACCINE  05/28/2023   Medicare Annual Wellness (AWV)  04/08/2024   DTaP/Tdap/Td (3 - Tdap) 05/30/2029   Pneumonia Vaccine 14+ Years old  Completed   Zoster Vaccines- Shingrix  Completed   HPV VACCINES  Aged Out   COVID-19 Vaccine  Discontinued      Review of Systems Patient reports no vision/hearing changes, anorexia, fever ,adenopathy, persistant/recurrent hoarseness, swallowing issues, chest pain, palpitations, edema, persistant/recurrent cough, hemoptysis, dyspnea (rest,exertional, paroxysmal nocturnal), gastrointestinal  bleeding (melena, rectal bleeding), abdominal pain, excessive heart burn, GU symptoms (dysuria, hematuria, voiding/incontinence issues) syncope, focal weakness, memory loss, numbness & tingling, skin/hair/nail changes, depression, anxiety, abnormal bruising/bleeding, musculoskeletal symptoms/signs.     Objective:   Physical Exam General Appearance:    Alert, cooperative, no distress, appears stated age  Head:    Normocephalic, without obvious abnormality, atraumatic  Eyes:    PERRL,  conjunctiva/corneas clear, EOM's intact both eyes       Ears:    Normal TM's and external ear canals, both ears  Nose:   Nares normal, septum midline, mucosa normal, no drainage   or sinus tenderness  Throat:   Lips, mucosa, and tongue normal; teeth and gums normal  Neck:   Supple, symmetrical, trachea midline, no adenopathy;       thyroid:  No enlargement/tenderness/nodules  Back:     Symmetric, no curvature, ROM normal, no CVA tenderness  Lungs:     Clear to auscultation bilaterally, respirations unlabored  Chest wall:    No tenderness or deformity  Heart:    Regular rate and rhythm, S1 and S2 normal, no murmur, rub   or gallop  Abdomen:     Soft, non-tender, bowel sounds active all four quadrants,    no masses, no organomegaly  Genitalia:    Normal male without lesion, masses,discharge or tenderness  Rectal:    Deferred due to young age  Extremities:   Extremities normal, atraumatic, no cyanosis or edema  Pulses:   2+ and symmetric all extremities  Skin:   Skin color, texture, turgor normal, no rashes or lesions  Lymph nodes:   Cervical, supraclavicular, and axillary nodes normal  Neurologic:   CNII-XII intact. normal sensation and reflexes      throughout          Assessment & Plan:

## 2023-05-14 NOTE — Patient Instructions (Signed)
Follow up in 6 months to recheck blood pressure and cholesterol We'll notify you of your lab results and make any changes if needed Keep up the good work!  You look great! Call with any questions or concerns Stay Safe!  Stay Healthy! HAPPY EARLY BIRTHDAY!!!

## 2023-05-15 ENCOUNTER — Other Ambulatory Visit: Payer: Self-pay

## 2023-05-15 ENCOUNTER — Telehealth: Payer: Self-pay

## 2023-05-15 DIAGNOSIS — D72829 Elevated white blood cell count, unspecified: Secondary | ICD-10-CM

## 2023-05-15 NOTE — Telephone Encounter (Signed)
Pt is aware of lab results and I have made him an lab only visit repeat CBC order is in

## 2023-05-15 NOTE — Telephone Encounter (Signed)
-----   Message from Neena Rhymes sent at 05/15/2023  7:43 AM EDT ----- Labs look great!  Your white blood cell count is mildly elevated but this is non specific and not concerning (it is a marker of inflammation or possible infection)  To make sure this is trending in the right direction, and not climbing, we'll repeat your CBC at a lab only visit in 2 weeks (dx elevated WBC)

## 2023-05-21 ENCOUNTER — Ambulatory Visit (HOSPITAL_COMMUNITY): Payer: Medicare Other | Attending: Cardiology

## 2023-05-21 DIAGNOSIS — R011 Cardiac murmur, unspecified: Secondary | ICD-10-CM | POA: Diagnosis present

## 2023-05-21 LAB — ECHOCARDIOGRAM COMPLETE
Area-P 1/2: 2.65 cm2
MV M vel: 5.65 m/s
MV Peak grad: 127.7 mmHg
S' Lateral: 2.2 cm

## 2023-05-27 ENCOUNTER — Other Ambulatory Visit: Payer: Self-pay

## 2023-05-27 MED ORDER — CARVEDILOL 6.25 MG PO TABS: 6.2500 mg | ORAL_TABLET | Freq: Two times a day (BID) | ORAL | 3 refills | Status: DC

## 2023-05-28 ENCOUNTER — Other Ambulatory Visit (INDEPENDENT_AMBULATORY_CARE_PROVIDER_SITE_OTHER): Payer: Medicare Other

## 2023-05-28 ENCOUNTER — Telehealth: Payer: Self-pay

## 2023-05-28 DIAGNOSIS — D72829 Elevated white blood cell count, unspecified: Secondary | ICD-10-CM

## 2023-05-28 LAB — CBC WITH DIFFERENTIAL/PLATELET
Basophils Absolute: 0.1 10*3/uL (ref 0.0–0.1)
Basophils Relative: 1 % (ref 0.0–3.0)
Eosinophils Absolute: 0.1 10*3/uL (ref 0.0–0.7)
Eosinophils Relative: 1.3 % (ref 0.0–5.0)
HCT: 40 % (ref 39.0–52.0)
Hemoglobin: 13.2 g/dL (ref 13.0–17.0)
Lymphocytes Relative: 26.3 % (ref 12.0–46.0)
Lymphs Abs: 1.9 10*3/uL (ref 0.7–4.0)
MCHC: 32.9 g/dL (ref 30.0–36.0)
MCV: 88.8 fl (ref 78.0–100.0)
Monocytes Absolute: 0.8 10*3/uL (ref 0.1–1.0)
Monocytes Relative: 10.5 % (ref 3.0–12.0)
Neutro Abs: 4.5 10*3/uL (ref 1.4–7.7)
Neutrophils Relative %: 60.9 % (ref 43.0–77.0)
RBC: 4.5 Mil/uL (ref 4.22–5.81)
RDW: 13.6 % (ref 11.5–15.5)
WBC: 7.4 10*3/uL (ref 4.0–10.5)

## 2023-05-28 NOTE — Telephone Encounter (Signed)
Informed pt of lab results  

## 2023-06-04 ENCOUNTER — Telehealth: Payer: Self-pay

## 2023-06-04 DIAGNOSIS — I421 Obstructive hypertrophic cardiomyopathy: Secondary | ICD-10-CM

## 2023-06-04 NOTE — Telephone Encounter (Signed)
Spoke with patient about Echo results, which show normal heart function with possible HOCM. Explained that HOCM needs to be evaluated by someone who specializes in this condition, Dr. Mayford Knife advising referral to Dr. Izora Ribas. Patient verbalizes understanding and agrees to plan, referral placed.

## 2023-06-04 NOTE — Telephone Encounter (Signed)
-----   Message from Armanda Magic sent at 06/03/2023  5:16 PM EDT ----- Echo showed normal heart function with possible HOCM.  Please refer to Dr. Izora Ribas

## 2023-06-05 NOTE — Addendum Note (Signed)
Addended by: Macie Burows on: 06/05/2023 09:35 AM   Modules accepted: Orders

## 2023-06-05 NOTE — Addendum Note (Signed)
Addended by: Macie Burows on: 06/05/2023 08:50 AM   Modules accepted: Orders

## 2023-06-05 NOTE — Telephone Encounter (Signed)
Placed referral for HOCM evaluation.

## 2023-06-12 ENCOUNTER — Encounter: Payer: Medicare Other | Admitting: Pharmacist

## 2023-06-17 ENCOUNTER — Ambulatory Visit: Payer: Medicare Other | Admitting: Family Medicine

## 2023-06-17 ENCOUNTER — Ambulatory Visit: Payer: Medicare Other | Admitting: Genetic Counselor

## 2023-06-17 ENCOUNTER — Encounter: Payer: Self-pay | Admitting: Internal Medicine

## 2023-06-17 ENCOUNTER — Ambulatory Visit: Payer: Medicare Other | Admitting: Internal Medicine

## 2023-06-17 VITALS — BP 136/56 | HR 61 | Ht 65.0 in | Wt 142.0 lb

## 2023-06-17 DIAGNOSIS — I10 Essential (primary) hypertension: Secondary | ICD-10-CM | POA: Diagnosis not present

## 2023-06-17 DIAGNOSIS — I2510 Atherosclerotic heart disease of native coronary artery without angina pectoris: Secondary | ICD-10-CM | POA: Diagnosis not present

## 2023-06-17 DIAGNOSIS — I421 Obstructive hypertrophic cardiomyopathy: Secondary | ICD-10-CM | POA: Diagnosis not present

## 2023-06-17 NOTE — Patient Instructions (Addendum)
Medication Instructions:  Your physician recommends that you continue on your current medications as directed. Please refer to the Current Medication list given to you today.  *If you need a refill on your cardiac medications before your next appointment, please call your pharmacy*   Lab Work: NONE  If you have labs (blood work) drawn today and your tests are completely normal, you will receive your results only by: MyChart Message (if you have MyChart) OR A paper copy in the mail If you have any lab test that is abnormal or we need to change your treatment, we will call you to review the results.   Testing/Procedures: Your physician has requested that you have a cardiac MRI. Cardiac MRI uses a computer to create images of your heart as its beating, producing both still and moving pictures of your heart and major blood vessels. For further information please visit [email or web address]. Please follow the instruction sheet given to you today for more information.   Your physician has requested that you have an exercise tolerance test. For further information please visit [email or web address] Please also follow instruction sheet, as given.    Follow-Up: At [facility], you and your health needs are our priority.  As part of our continuing mission to provide you with exceptional heart care, we have created designated Provider Care Teams.  These Care Teams include your primary Cardiologist (physician) and Advanced Practice Providers (APPs -  Physician Assistants and Nurse Practitioners) who all work together to provide you with the care you need, when you need it.     Your next appointment:   1 year(s)  Provider:   Riley Lam, MD  Other Instructions  You are scheduled for Cardiac MRI on ______________. Please arrive for your appointment at ______________ ( arrive 30-45 minutes prior to test start time). ?  [facility] [address] [place], [place]  [address] [phone] Please take advantage of the free valet parking available at the [facility] and [employer] (Entrance C).  Proceed to the [facility] Radiology Department (First Floor) for check-in.   OR   [facility] [address] [place], [place] [address] [phone] Please go to the [facility] and check-in with the desk attendant.   Magnetic resonance imaging (MRI) is a painless test that produces images of the inside of the body without using Xrays.  During an MRI, strong magnets and radio waves work together in a Data processing manager to form detailed images.   MRI images may provide more details about a medical condition than X-rays, CT scans, and ultrasounds can provide.  You may be given earphones to listen for instructions.  You may eat a light breakfast and take medications as ordered with the exception of furosemide, hydrochlorothiazide, or spironolactone(fluid pill, other). Please avoid stimulants for 12 hr prior to test. (Ie. Caffeine, nicotine, chocolate, or antihistamine medications)  An IV will be inserted into one of your veins. Contrast material will be injected into your IV. It will leave your body through your urine within a day. You may be told to drink plenty of fluids to help flush the contrast material out of your system.  You will be asked to remove all metal, including: Watch, jewelry, and other metal objects including hearing aids, hair pieces and dentures. Also wearable glucose monitoring systems (ie. Freestyle Libre and Omnipods) (Braces and fillings normally are not a problem.)   TEST WILL TAKE APPROXIMATELY 1 HOUR  PLEASE NOTIFY SCHEDULING AT LEAST  24 HOURS IN ADVANCE IF YOU ARE UNABLE TO KEEP YOUR APPOINTMENT. [phone]  For more information and frequently asked questions, please visit our website : [email or web address]  Please call the Cardiac Imaging Nurse Navigators with any  questions/concerns. [phone] Office

## 2023-06-17 NOTE — Progress Notes (Signed)
Cardiology Office Note:    Date:  06/17/2023   ID:  [name], DOB 1935/03/12, MRN [id number]  PCP:  Sheliah Hatch, MD   [facility] Providers Cardiologist:  Armanda Magic, MD     Referring MD: Quintella Reichert, MD   CC: Told to come Consulted for the evaluation of HCM at the behest of Dr. Mayford Knife  History of Present Illness:    [name] is a 88 y.o. male with a hx of HTN, CAS,HLD CAD with PCI 2009 (angina prior).  IN July 2024 have echocardiogram.  In PSAX imaging basal septal hypertrophy 2.2 cm.  2006 CT Chest (non gated) septal thickness of 15 mm.  At last assessment has echocardiogram with Valsalva gradient of 43 mm Hg.  In 2016 Valsalva gradient of 180 mm Hg (per report).   Notes that he feels well.  He is an [employer] fan and goes to football and basketball games (kids and grandchildren went there, he went there too).  Wife notes he does light yardwork every morning. Goes up 14 stairs at least 20 times a day with no symptoms.  Patient notes no SOB at rest and no DOE Notes no fatigue. Notes no palpitations Notes no CP. Notes no dizziness. Notes no syncope.  Wife notes that his blood pressure has been well controlled ~ 130/70.  Notable family events include  - father had a stroke at the age of 108, mother had a stroke 75 - no evidence of HCM or SCD - three kids, two girls and a boy.  Has 4 grandchildren.    Past Medical History:  Diagnosis Date   Allergy    Arthritis    "scattered" (10/26/2018)   Asthma    C. difficile diarrhea    CAD (coronary artery disease) 2005   s/p PCI of RCA   Carotid stenosis    1 to 39% right carotid artery stenosis and 40 to 59% left carotid artery stenosis by Dopplers 01/2023   Cataract    NS OD   Cognitive impairment, mild, so stated    Colitis    Erectile dysfunction    GERD (gastroesophageal reflux disease)    Hyperlipidemia    Hypertension    Perforated stomach, acute 10/23/2018   Peripheral vascular disease  (HCC)    Retinal detachment    Vertebrobasilar artery syndrome    Vertigo     Past Surgical History:  Procedure Laterality Date   APPENDECTOMY  1954   CATARACT EXTRACTION Left 08/08/2019   Dr. Hazle Quant   COLONOSCOPY     ESOPHAGOGASTRODUODENOSCOPY (EGD) WITH ESOPHAGEAL DILATION  09/2018   EYE SURGERY     LAPAROTOMY N/A 10/23/2018   Procedure: EXPLORATORY LAPAROTOMY WITH GRAHAM PATCH FOR PERFORATED ULCER AND PLACEMENT OF GASTRIC TUBE;  Surgeon: Emelia Loron, MD;  Location: [facility] OR;  Service: General;  Laterality: N/A;   NASAL SINUS SURGERY  05/2018   RETINAL DETACHMENT SURGERY     "? side"   UPPER GASTROINTESTINAL ENDOSCOPY      Current Medications: Current Meds  Medication Sig   acetaminophen (TYLENOL) 500 MG tablet Take 500 mg by mouth 2 (two) times daily.   albuterol (PROVENTIL) (2.5 MG/3ML) 0.083% nebulizer solution Take 3 mLs (2.5 mg total) by nebulization every 6 (six) hours as needed for wheezing or shortness of breath.   albuterol (VENTOLIN HFA) 108 (90 Base) MCG/ACT inhaler Inhale 1-2 puffs into the lungs every 4 (four) hours as needed for wheezing or shortness of breath.  amLODipine (NORVASC) 5 MG tablet Take 1 tablet (5 mg total) by mouth daily.   aspirin EC 81 MG tablet Take 81 mg by mouth daily.   atorvastatin (LIPITOR) 80 MG tablet TAKE 1 TABLET(80 MG) BY MOUTH DAILY   atorvastatin (LIPITOR) 80 MG tablet Take 1 tablet (80 mg total) by mouth daily. Please keep upcoming appt.with provider in June to receive future refills. Thank You.   azelastine (ASTELIN) 0.1 % nasal spray USE 1 SPRAY IN EACH NOSTRIL TWICE DAILY   carvedilol (COREG) 6.25 MG tablet Take 1 tablet (6.25 mg total) by mouth 2 (two) times daily with a meal. Please keep upcoming appt for future refills Thanks   cholecalciferol (VITAMIN D3) 25 MCG (1000 UT) tablet Take 1,000 Units by mouth daily.   donepezil (ARICEPT) 10 MG tablet Take 1 tablet (10 mg total) by mouth daily.   Eyelid Cleansers (OCUSOFT EYELID  CLEANSING) PADS Place 1 application into both eyes 2 (two) times daily.   fish oil-omega-3 fatty acids 1000 MG capsule Take 2 capsules (2 g total) by mouth 2 (two) times daily.   fluticasone (FLONASE) 50 MCG/ACT nasal spray 1 spray each nostril twice daily   fluticasone furoate-vilanterol (BREO ELLIPTA) 100-25 MCG/ACT AEPB INHALE 1 PUFF INTO THE LUNGS DAILY   gabapentin (NEURONTIN) 300 MG capsule TAKE 1 CAPSULE(300 MG) BY MOUTH THREE TIMES DAILY   hydrochlorothiazide (HYDRODIURIL) 25 MG tablet TAKE 1 TABLET(25 MG) BY MOUTH DAILY   hydrochlorothiazide (HYDRODIURIL) 25 MG tablet Take 1 tablet (25 mg total) by mouth daily. Please keep upcoming appt.with provider in June to receive future refills. Thank You.   montelukast (SINGULAIR) 10 MG tablet TAKE 1 TABLET BY MOUTH AT BEDTIME GENERIC EQUIVALENT FOR SINGULAIR   Polyvinyl Alcohol-Povidone (REFRESH OP) Place 1 drop into both eyes 2 (two) times daily.   Saw Palmetto 500 MG CAPS Take 1,000 mg by mouth 2 (two) times daily.      Allergies:   Neosporin [bacitracin-polymyxin b]   Social History   Socioeconomic History   Marital status: Married    Spouse name: Not on file   Number of children: 3   Years of education: Not on file   Highest education level: Not on file  Occupational History   Occupation: Retired Teaching laboratory technician: RETIRED  Tobacco Use   Smoking status: Former    Types: Cigars    Quit date: 05/13/1961    Years since quitting: 62.1    Passive exposure: Never   Smokeless tobacco: Never   Tobacco comments:    "never inhaled" in the early 60's   Vaping Use   Vaping status: Never Used  Substance and Sexual Activity   Alcohol use: Yes    Alcohol/week: 14.0 standard drinks of alcohol    Types: 14 Glasses of wine per week   Drug use: Never   Sexual activity: Not Currently  Other Topics Concern   Not on file  Social History Narrative   Married with 1 son and 2 daughters.  Retired.   2 caffeinated beverages  daily, 3 glasses of wine daily   Never smoker no drug use no tobacco   Social Determinants of Health   Financial Resource Strain: Low Risk  (04/09/2023)   Overall Financial Resource Strain (CARDIA)    Difficulty of Paying Living Expenses: Not hard at all  Food Insecurity: No Food Insecurity (04/09/2023)   Hunger Vital Sign    Worried About Running Out of Food in the Last Year:  Never true    Ran Out of Food in the Last Year: Never true  Transportation Needs: No Transportation Needs (04/09/2023)   PRAPARE - Administrator, Civil Service (Medical): No    Lack of Transportation (Non-Medical): No  Physical Activity: Insufficiently Active (04/09/2023)   Exercise Vital Sign    Days of Exercise per Week: 3 days    Minutes of Exercise per Session: 30 min  Stress: No Stress Concern Present (04/09/2023)   [employer] of Occupational Health - Occupational Stress Questionnaire    Feeling of Stress : Not at all  Social Connections: Moderately Integrated (04/09/2023)   Social Connection and Isolation Panel [NHANES]    Frequency of Communication with Friends and Family: More than three times a week    Frequency of Social Gatherings with Friends and Family: Three times a week    Attends Religious Services: Never    Active Member of Clubs or Organizations: Yes    Attends Engineer, structural: More than 4 times per year    Marital Status: Married   Family History: The patient's family history includes Colon polyps in his father; Deep vein thrombosis in his father and mother; Diverticulitis in his father; Hyperlipidemia in his father and mother; Hypertension in his father and mother; Stroke in his father and mother; Ulcerative colitis in his father. There is no history of Colon cancer, Esophageal cancer, Stomach cancer, or Rectal cancer.  ROS:   Please see the history of present illness.     EKGs/Labs/Other Studies Reviewed:    Cardiac Studies & Procedures        ECHOCARDIOGRAM  ECHOCARDIOGRAM COMPLETE 05/21/2023  Narrative ECHOCARDIOGRAM REPORT    Patient Name:   [name]   Date of Exam: 05/21/2023 Medical Rec #:  [id number]     Height:       65.0 in Accession #:    [id number]    Weight:       142.4 lb Date of Birth:  Mar 07, 1935     BSA:          1.712 m Patient Age:    87 years      BP:           157/66 mmHg Patient Gender: M             HR:           61 bpm. Exam Location:  [address]  Procedure: 2D Echo, 3D Echo, Cardiac Doppler and Color Doppler  Indications:    R01.1 Murmur  History:        Patient has prior history of Echocardiogram examinations, most recent 11/12/2014. CAD, Carotid Disease and PVD; Risk Factors:Hypertension and HLD.  Sonographer:    Clearence Ped RCS Referring Phys: [phone] TRACI R TURNER  IMPRESSIONS   1. There is an outflow tract gradient. Peak velocity 2.45 m/s. Peak gradient 24 mmHg. With Valsalva this increases to 3.3 m/s and 42 mmHg respectively. Left ventricular ejection fraction, by estimation, is 65 to 70%. The left ventricle has normal function. The left ventricle has no regional wall motion abnormalities. Left ventricular diastolic parameters are consistent with Grade I diastolic dysfunction (impaired relaxation). 2. Right ventricular systolic function is normal. The right ventricular size is normal. There is normal pulmonary artery systolic pressure. 3. Left atrial size was mildly dilated. 4. The mitral valve is normal in structure. Trivial mitral valve regurgitation. No evidence of mitral stenosis. 5. The aortic valve is tricuspid. Aortic valve regurgitation is  not visualized. No aortic stenosis is present. 6. The inferior vena cava is normal in size with greater than 50% respiratory variability, suggesting right atrial pressure of 3 mmHg.  FINDINGS Left Ventricle: There is an outflow tract gradient. Peak velocity 2.45 m/s. Peak gradient 24 mmHg. With Valsalva this increases to 3.3 m/s and 42 mmHg  respectively. Left ventricular ejection fraction, by estimation, is 65 to 70%. The left ventricle has normal function. The left ventricle has no regional wall motion abnormalities. The left ventricular internal cavity size was normal in size. There is no left ventricular hypertrophy. Left ventricular diastolic parameters are consistent with Grade I diastolic dysfunction (impaired relaxation). Indeterminate filling pressures.  Right Ventricle: The right ventricular size is normal. No increase in right ventricular wall thickness. Right ventricular systolic function is normal. There is normal pulmonary artery systolic pressure. The tricuspid regurgitant velocity is 2.01 m/s, and with an assumed right atrial pressure of 3 mmHg, the estimated right ventricular systolic pressure is 19.2 mmHg.  Left Atrium: Left atrial size was mildly dilated.  Right Atrium: Right atrial size was normal in size.  Pericardium: There is no evidence of pericardial effusion.  Mitral Valve: The mitral valve is normal in structure. Trivial mitral valve regurgitation. No evidence of mitral valve stenosis.  Tricuspid Valve: The tricuspid valve is normal in structure. Tricuspid valve regurgitation is trivial. No evidence of tricuspid stenosis.  Aortic Valve: The aortic valve is tricuspid. Aortic valve regurgitation is not visualized. No aortic stenosis is present.  Pulmonic Valve: The pulmonic valve was normal in structure. Pulmonic valve regurgitation is not visualized. No evidence of pulmonic stenosis.  Aorta: The aortic root is normal in size and structure.  Venous: The inferior vena cava is normal in size with greater than 50% respiratory variability, suggesting right atrial pressure of 3 mmHg.  IAS/Shunts: No atrial level shunt detected by color flow Doppler.   LEFT VENTRICLE PLAX 2D LVIDd:         3.30 cm   Diastology LVIDs:         2.20 cm   LV e' medial:    5.55 cm/s LV PW:         0.90 cm   LV E/e' medial:   13.9 LV IVS:        1.00 cm   LV e' lateral:   8.27 cm/s LVOT diam:     2.00 cm   LV E/e' lateral: 9.3 LV SV:         143 LV SV Index:   83 LVOT Area:     3.14 cm  3D Volume EF: 3D EF:        64 % LV EDV:       100 ml LV ESV:       36 ml LV SV:        64 ml  RIGHT VENTRICLE RV S prime:     17.20 cm/s TAPSE (M-mode): 1.9 cm RVSP:           19.2 mmHg  LEFT ATRIUM             Index        RIGHT ATRIUM           Index LA diam:        3.90 cm 2.28 cm/m   RA Pressure: 3.00 mmHg LA Vol (A2C):   55.9 ml 32.65 ml/m  RA Area:     13.70 cm LA Vol (A4C):   32.2 ml 18.81  ml/m  RA Volume:   31.30 ml  18.28 ml/m LA Biplane Vol: 46.2 ml 26.98 ml/m AORTIC VALVE LVOT Vmax:   204.00 cm/s LVOT Vmean:  143.000 cm/s LVOT VTI:    0.454 m  AORTA Ao Root diam: 3.40 cm Ao Asc diam:  3.30 cm  MITRAL VALVE                TRICUSPID VALVE MV Area (PHT):              TR Peak grad:   16.2 mmHg MV Decel Time:              TR Vmax:        201.00 cm/s MR Peak grad: 127.7 mmHg    Estimated RAP:  3.00 mmHg MR Mean grad: 87.0 mmHg     RVSP:           19.2 mmHg MR Vmax:      565.00 cm/s MR Vmean:     449.0 cm/s    SHUNTS MV E velocity: 77.00 cm/s   Systemic VTI:  0.45 m MV A velocity: 107.00 cm/s  Systemic Diam: 2.00 cm MV E/A ratio:  0.72  Chilton Si MD Electronically signed by Chilton Si MD Signature Date/Time: 05/21/2023/4:34:28 PM    Final                Recent Labs: 11/17/2022: Platelets 232.0 05/14/2023: ALT 23; BUN 23; Creatinine, Ser 1.02; Potassium 4.1; Sodium 141; TSH 2.75 05/28/2023: Hemoglobin 13.2  Recent Lipid Panel    Component Value Date/Time   CHOL 123 05/14/2023 1408   CHOL 89 (L) 09/27/2019 0810   TRIG 83.0 05/14/2023 1408   HDL 42.70 05/14/2023 1408   HDL 34 (L) 09/27/2019 0810   CHOLHDL 3 05/14/2023 1408   VLDL 16.6 05/14/2023 1408   LDLCALC 64 05/14/2023 1408   LDLCALC 59 11/02/2020 1428        Physical Exam:    VS:  BP (!) 136/56   Pulse 61    Ht 5\' 5"  (1.651 m)   Wt 142 lb (64.4 kg)   SpO2 96%   BMI 23.63 kg/m     Wt Readings from Last 3 Encounters:  06/17/23 142 lb (64.4 kg)  05/14/23 142 lb 6 oz (64.6 kg)  04/23/23 144 lb 12.8 oz (65.7 kg)     GEN:  Well nourished, well developed in no acute distress HEENT: Normal NECK: No JVD CARDIAC: RRR, no rubs, gallops, harsh systolic murmur worsening with standing RESPIRATORY:  Clear to auscultation without rales, wheezing or rhonchi  ABDOMEN: Soft, non-tender, non-distended MUSCULOSKELETAL:  No edema; No deformity  SKIN: Warm and dry NEUROLOGIC:  Alert and oriented x 3 PSYCHIATRIC:  Normal affect   ASSESSMENT:    1. Obstructive hypertrophic cardiomyopathy (HCC)   2. Coronary artery disease involving native coronary artery of native heart without angina pectoris   3. Essential hypertension    PLAN:    Hypertrophic Cardiomyopathy vs age related basal septal hypertrophy - peak gradient has improved from 2016 report to 2024   - suspicion of Fabry's/Danon or other mimics of HCM: low; BP has been 130/70s, moderate risk of amyloid due to age, gradient was noted in 2016 - Gene variant: NA  - NYHA I - will get POET - Non HCM Contributors to disease/status HTN- well controlled CAD and HLD- LDL at goal, no CP for almost a decade  Family history Reviewed, Discussed family screening  (Will offer genetic testing, he  had three children who live in [place] and can screening here; has genetic counseling referral)  SCD  Assessment - Age greater than 82 with no symptoms - will get POET largely for functional status - CMR to confirm septal thickness - suspect overall low risk for SCD  Atrial fibrillation (no evidence)  Medication symptom plan - asymptomatic; no changes to medications.  Overall doing well. Goal is for confirmatory testing, gave education to patient and wife, and family screening support  Time Spent Directly with Patient:   I have spent a total of 65 minutes  with the patient reviewing notes, imaging, EKGs, labs and examining the patient as well as establishing an assessment and plan that was discussed personally with the patient.  > 50% of time was spent in direct patient care and family.   One year with me        Medication Adjustments/Labs and Tests Ordered: Current medicines are reviewed at length with the patient today.  Concerns regarding medicines are outlined above.  Orders Placed This Encounter  Procedures   MR CARDIAC MORPHOLOGY W WO CONTRAST   EXERCISE TOLERANCE TEST (ETT)   No orders of the defined types were placed in this encounter.   Patient Instructions  Medication Instructions:  Your physician recommends that you continue on your current medications as directed. Please refer to the Current Medication list given to you today.  *If you need a refill on your cardiac medications before your next appointment, please call your pharmacy*   Lab Work: NONE  If you have labs (blood work) drawn today and your tests are completely normal, you will receive your results only by: MyChart Message (if you have MyChart) OR A paper copy in the mail If you have any lab test that is abnormal or we need to change your treatment, we will call you to review the results.   Testing/Procedures: Your physician has requested that you have a cardiac MRI. Cardiac MRI uses a computer to create images of your heart as its beating, producing both still and moving pictures of your heart and major blood vessels. For further information please visit [email or web address]. Please follow the instruction sheet given to you today for more information.   Your physician has requested that you have an exercise tolerance test. For further information please visit [email or web address] Please also follow instruction sheet, as given.    Follow-Up: At [facility], you and your health needs are our priority.  As part of our continuing mission to provide  you with exceptional heart care, we have created designated Provider Care Teams.  These Care Teams include your primary Cardiologist (physician) and Advanced Practice Providers (APPs -  Physician Assistants and Nurse Practitioners) who all work together to provide you with the care you need, when you need it.     Your next appointment:   1 year(s)  Provider:   Riley Lam, MD  Other Instructions  You are scheduled for Cardiac MRI on ______________. Please arrive for your appointment at ______________ ( arrive 30-45 minutes prior to test start time). ?  [facility] [address] [place], [place] [address] [phone] Please take advantage of the free valet parking available at the [facility] and [employer] (Entrance C).  Proceed to the [facility] Radiology Department (First Floor) for check-in.   OR   [facility] [address] [place], [place] [address] [phone] Please go to the [facility] and check-in with the desk attendant.  Magnetic resonance imaging (MRI) is a painless test that produces images of the inside of the body without using Xrays.  During an MRI, strong magnets and radio waves work together in a Data processing manager to form detailed images.   MRI images may provide more details about a medical condition than X-rays, CT scans, and ultrasounds can provide.  You may be given earphones to listen for instructions.  You may eat a light breakfast and take medications as ordered with the exception of furosemide, hydrochlorothiazide, or spironolactone(fluid pill, other). Please avoid stimulants for 12 hr prior to test. (Ie. Caffeine, nicotine, chocolate, or antihistamine medications)  An IV will be inserted into one of your veins. Contrast material will be injected into your IV. It will leave your body through your urine within a day. You may be told to drink plenty of fluids to help flush the contrast material  out of your system.  You will be asked to remove all metal, including: Watch, jewelry, and other metal objects including hearing aids, hair pieces and dentures. Also wearable glucose monitoring systems (ie. Freestyle Libre and Omnipods) (Braces and fillings normally are not a problem.)   TEST WILL TAKE APPROXIMATELY 1 HOUR  PLEASE NOTIFY SCHEDULING AT LEAST 24 HOURS IN ADVANCE IF YOU ARE UNABLE TO KEEP YOUR APPOINTMENT. [phone]  For more information and frequently asked questions, please visit our website : [email or web address]  Please call the Cardiac Imaging Nurse Navigators with any questions/concerns. [phone] Office      Signed, Christell Constant, MD  06/17/2023 10:09 AM    [facility]

## 2023-06-23 NOTE — Progress Notes (Signed)
Referring Provider: Riley Lam, MD  Pre Test Genetic Consult  Referral Reason  [name], a new patient at the [facility] clinic, is referred for genetic consult and testing of hypertrophic cardiomyopathy.  Personal Medical Information [name] (III.2 on pedigree) is a 88 year-old pleasant Caucasian gentleman who is here today with his wife, [name]. He tells me that he went to his high school reunion two days ago at [employer] and 8 classmates attended with 43 now passed away! Also tells me of land that his grandfather donated to build a church that is named after his family, [employer].  He is followed regularly by Dr. Mayford Knife after having a stent at 21 and underwent an echocardiogram in July 2024 that detected cardiac wall thickness suggestive of HCM. Dr. Izora Ribas has computed his IVS wall thickness at 1.9 cm.  [name] is asymptomatic with no major functional limitations.  Traditional Risk Factors [name] reports having hypertension in his 64s and states that it is well-controlled with medication.   Family history  Relation to Proband Pedigree # Current age Heart condition/age of onset Notes  Daughters, 2 IV.3-IV.4 60, 58 None Echo/EKG not done  Son IV.5 56 None Echo/EKG not done  Grandchildren, 4 V.1-V.4 24-17 None   Brother III.1 Deceased None Died suddenly on the commode @ 72.  He underwent surgery for appendicitis and was discharged the next day. Autopsy detected sepsis as cause of death. Wife sued the hospital and was awarded Youth worker.  Nephew, niece IV.1, IV.2  3s None         Father II.5 Deceased None Died of stroke @ 23  Paternal uncles, aunts 4 II.1- II.4 Deceased None   All died in their 26s of old age  Paternal grandfather I.1 Deceased Heart issues Died in t 71s of old age  Paternal grandmother I.2 Deceased None Died @ 4s of old age         Mother II.6 Deceased  None   Died @ 104 of stroke   Maternal uncle II.7 Deceased  None   Died of old age @  40s  Maternal grandfather I.3 Deceased unaware Died suddenly- suspected M.I., age ?  Maternal grandmother I.4 Deceased None  Died of poor health  @ 70s, had multiple strokes    Genetics [name] was counseled on the genetics of hypertrophic cardiomyopathy (HCM). I explained to the patient that this is an autosomal dominant condition with incomplete penetrance i.e. not all individuals harboring the HCM mutation will present clinically with HCM, and age-related penetrance where clinical presentation of HCM increases with advanced age. Variability in clinical expression is also seen in families with HCM with affected family members presenting clinically at different ages and with symptoms ranging from mild to severe.  Since HCM is an autosomal dominant condition, first degree-relatives should seek regular surveillance for HCM.  First-degree relatives, include his two daughters and sons- all live in [place]. Clinical screening of first-degree relatives involves echocardiogram and EKG at regular intervals, frequency is typically determined by age, with those in their teens undergoing screening every year until the age of 67 and those over the age of 57 getting screened every 3-5 years. Patient verbalized understanding of this.  I informed the patient that about 8-10% of HCM patients can have compound and digenic mutations for HCM. Also briefly discussed the inheritance pattern and treatment /management plans for the infiltrative cardiomyopathies that present as HCM phenocopies.   Genetic testing is a probabilistic test dependent upon age and severity  of presentation, presence of risk factors for HCM and importantly family history of HCM or sudden death in first-degree relatives.   If a mutation is not identified, then all first-degree relatives should undergo regular screening for HCM. I emphasized that even if the genetic test is negative, it does not mean that he does not have HCM. A negative test result  can be due to limitations of the genetic test.   There is also the likelihood of identifying a "Variant of unknown significance". This result means that the variant has not been detected in a statistically significant number of HCM patients and/or functional studies have not been performed to verify its pathogenicity. This VUS can be tested in the family to see if it segregates with disease. If a VUS is found, first-degree relatives should undergo regular clinical screening for HCM.  If a pathogenic variant is reported, then first-degree family members can get tested for this variant. If they test positive, it is likely they will develop HCM. In light of variable expression and incomplete penetrance associated with HCM, it is not possible to predict when they will manifest clinically with HCM. It is recommended that family members that test positive for the familial pathogenic variant pursue clinical screening for HCM. Family members that test negative for the familial mutation need not pursue periodic screening for HCM, but seek care if symptoms develop.   Impression  [name] was found to have cardiac wall thickness suggestive of HCM at age 14 in the absence of other loading conditions such as uncontrolled HTN that can cause cardiac hypertrophy. He reports no family history of HCM but reports sudden death in his brother (with sepsis) and maternal grandfather. While it is possible that there is reduced penetrance for HCM in the family, it is also likely that he has a de novo mutation for HCM.  Genetic testing for HCM is recommended. This test should include the sarcomeric genes, namely MYBPC3, MYH7, TNNI3, TNNT2, TPM1, ACTC1, MYL2, MYL3 implicated in HCM as well as the genes for the HCM phenocopies, such as Fabry disease (GLA), Danon disease (LAMP2), WPW syndrome (PRKAG2) and familial transthyretin amyloidosis (TTR) and phospholamban (PLN)  In addition, we discussed the protections afforded by the Genetic  Information Non-Discrimination Act (GINA). I explained to the patient that GINA protects them from losing their employment or health insurance based on their genotype. However, these protections do not cover life insurance and disability. Patient verbalized understanding of this and is unsure if his children have life insurance coverage.  Please note that the patient has not been counseled in this visit on other personal, cultural, or ethical issues that they may face due to their heart condition.   Plan After a thorough discussion of the risk and benefits of genetic testing for HCM, [name] declines genetic testing due to insurance non-coverage. He will discuss HCM screening recommendations with his children and inform them about obtaining life insurance.     Sidney Ace, Ph.D, [facility] Clinical Molecular Geneticist

## 2023-06-25 ENCOUNTER — Ambulatory Visit: Payer: Medicare Other | Attending: Internal Medicine

## 2023-06-25 DIAGNOSIS — I421 Obstructive hypertrophic cardiomyopathy: Secondary | ICD-10-CM | POA: Diagnosis not present

## 2023-06-25 LAB — EXERCISE TOLERANCE TEST
Angina Index: 0
Duke Treadmill Score: 5
Estimated workload: 6.1
Exercise duration (min): 4 min
Exercise duration (sec): 55 s
MPHR: 134 {beats}/min
Peak HR: 130 {beats}/min
Percent HR: 97 %
RPE: 15
Rest HR: 54 {beats}/min
ST Depression (mm): 0 mm

## 2023-06-26 NOTE — Progress Notes (Unsigned)
[name] [address] [place] [address] Phone: [phone] Subjective:   [name], am serving as a scribe for Dr. Antoine [name].  I'm seeing this patient by the request  of:  Robert Hatch, MD  CC: back pain   [id number]  02/11/2023 Underlying spinal stenosis but is doing relatively well. Discussed icing regimen and home exercises, discussed which activities to do and which ones to avoid. Patient is going to continue to work on some of the exercises. No other significant changes at the moment. Follow-up with me again in 4 months   Updated 07/06/2023 [name] is a 88 y.o. male coming in with complaint of back pain. Patient states no pain whatsoever at this moment.  Happy with the results.  Feeling like himself at the moment.       Past Medical History:  Diagnosis Date   Allergy    Arthritis    "scattered" (10/26/2018)   Asthma    C. difficile diarrhea    CAD (coronary artery disease) 2005   s/p PCI of RCA   Carotid stenosis    1 to 39% right carotid artery stenosis and 40 to 59% left carotid artery stenosis by Dopplers 01/2023   Cataract    NS OD   Cognitive impairment, mild, so stated    Colitis    Erectile dysfunction    GERD (gastroesophageal reflux disease)    Hyperlipidemia    Hypertension    Perforated stomach, acute 10/23/2018   Peripheral vascular disease (HCC)    Retinal detachment    Vertebrobasilar artery syndrome    Vertigo    Past Surgical History:  Procedure Laterality Date   APPENDECTOMY  1954   CATARACT EXTRACTION Left 08/08/2019   Dr. Hazle Quant   COLONOSCOPY     ESOPHAGOGASTRODUODENOSCOPY (EGD) WITH ESOPHAGEAL DILATION  09/2018   EYE SURGERY     LAPAROTOMY N/A 10/23/2018   Procedure: EXPLORATORY LAPAROTOMY WITH GRAHAM PATCH FOR PERFORATED ULCER AND PLACEMENT OF GASTRIC TUBE;  Surgeon: Emelia Loron, MD;  Location: [facility] OR;  Service: General;  Laterality: N/A;   NASAL SINUS SURGERY   05/2018   RETINAL DETACHMENT SURGERY     "? side"   UPPER GASTROINTESTINAL ENDOSCOPY     Social History   Socioeconomic History   Marital status: Married    Spouse name: Not on file   Number of children: 3   Years of education: Not on file   Highest education level: Not on file  Occupational History   Occupation: Retired Teaching laboratory technician: RETIRED  Tobacco Use   Smoking status: Former    Types: Cigars    Quit date: 05/13/1961    Years since quitting: 62.1    Passive exposure: Never   Smokeless tobacco: Never   Tobacco comments:    "never inhaled" in the early 60's   Vaping Use   Vaping status: Never Used  Substance and Sexual Activity   Alcohol use: Yes    Alcohol/week: 14.0 standard drinks of alcohol    Types: 14 Glasses of wine per week   Drug use: Never   Sexual activity: Not Currently  Other Topics Concern   Not on file  Social History Narrative   Married with 1 son and 2 daughters.  Retired.   2 caffeinated beverages daily, 3 glasses of wine daily   Never smoker no drug use no tobacco   Social Determinants of Corporate investment banker  Strain: Low Risk  (04/09/2023)   Overall Financial Resource Strain (CARDIA)    Difficulty of Paying Living Expenses: Not hard at all  Food Insecurity: No Food Insecurity (04/09/2023)   Hunger Vital Sign    Worried About Running Out of Food in the Last Year: Never true    Ran Out of Food in the Last Year: Never true  Transportation Needs: No Transportation Needs (04/09/2023)   PRAPARE - Administrator, Civil Service (Medical): No    Lack of Transportation (Non-Medical): No  Physical Activity: Insufficiently Active (04/09/2023)   Exercise Vital Sign    Days of Exercise per Week: 3 days    Minutes of Exercise per Session: 30 min  Stress: No Stress Concern Present (04/09/2023)   [employer] of Occupational Health - Occupational Stress Questionnaire    Feeling of Stress : Not at all  Social  Connections: Moderately Integrated (04/09/2023)   Social Connection and Isolation Panel [NHANES]    Frequency of Communication with Friends and Family: More than three times a week    Frequency of Social Gatherings with Friends and Family: Three times a week    Attends Religious Services: Never    Active Member of Clubs or Organizations: Yes    Attends Engineer, structural: More than 4 times per year    Marital Status: Married   Allergies  Allergen Reactions   Neosporin [Bacitracin-Polymyxin B] Other (See Comments)    Redness   Family History  Problem Relation Age of Onset   Stroke Mother    Hyperlipidemia Mother    Hypertension Mother    Deep vein thrombosis Mother    Stroke Father    Hyperlipidemia Father    Hypertension Father    Deep vein thrombosis Father    Diverticulitis Father    Ulcerative colitis Father    Colon polyps Father    Colon cancer Neg Hx    Esophageal cancer Neg Hx    Stomach cancer Neg Hx    Rectal cancer Neg Hx      Current Outpatient Medications (Cardiovascular):    amLODipine (NORVASC) 5 MG tablet, Take 1 tablet (5 mg total) by mouth daily.   atorvastatin (LIPITOR) 80 MG tablet, TAKE 1 TABLET(80 MG) BY MOUTH DAILY   atorvastatin (LIPITOR) 80 MG tablet, Take 1 tablet (80 mg total) by mouth daily. Please keep upcoming appt.with provider in June to receive future refills. Thank You.   carvedilol (COREG) 6.25 MG tablet, Take 1 tablet (6.25 mg total) by mouth 2 (two) times daily with a meal. Please keep upcoming appt for future refills Thanks   hydrochlorothiazide (HYDRODIURIL) 25 MG tablet, TAKE 1 TABLET(25 MG) BY MOUTH DAILY   hydrochlorothiazide (HYDRODIURIL) 25 MG tablet, Take 1 tablet (25 mg total) by mouth daily. Please keep upcoming appt.with provider in June to receive future refills. Thank You.  Current Outpatient Medications (Respiratory):    albuterol (PROVENTIL) (2.5 MG/3ML) 0.083% nebulizer solution, Take 3 mLs (2.5 mg total) by  nebulization every 6 (six) hours as needed for wheezing or shortness of breath.   albuterol (VENTOLIN HFA) 108 (90 Base) MCG/ACT inhaler, Inhale 1-2 puffs into the lungs every 4 (four) hours as needed for wheezing or shortness of breath.   azelastine (ASTELIN) 0.1 % nasal spray, USE 1 SPRAY IN EACH NOSTRIL TWICE DAILY   fluticasone (FLONASE) 50 MCG/ACT nasal spray, 1 spray each nostril twice daily   fluticasone furoate-vilanterol (BREO ELLIPTA) 100-25 MCG/ACT AEPB, INHALE 1 PUFF INTO THE  LUNGS DAILY   montelukast (SINGULAIR) 10 MG tablet, TAKE 1 TABLET BY MOUTH AT BEDTIME GENERIC EQUIVALENT FOR SINGULAIR  Current Outpatient Medications (Analgesics):    acetaminophen (TYLENOL) 500 MG tablet, Take 500 mg by mouth 2 (two) times daily.   aspirin EC 81 MG tablet, Take 81 mg by mouth daily.   Current Outpatient Medications (Other):    cholecalciferol (VITAMIN D3) 25 MCG (1000 UT) tablet, Take 1,000 Units by mouth daily.   donepezil (ARICEPT) 10 MG tablet, Take 1 tablet (10 mg total) by mouth daily.   Eyelid Cleansers (OCUSOFT EYELID CLEANSING) PADS, Place 1 application into both eyes 2 (two) times daily.   fish oil-omega-3 fatty acids 1000 MG capsule, Take 2 capsules (2 g total) by mouth 2 (two) times daily.   gabapentin (NEURONTIN) 300 MG capsule, TAKE 1 CAPSULE(300 MG) BY MOUTH THREE TIMES DAILY   Polyvinyl Alcohol-Povidone (REFRESH OP), Place 1 drop into both eyes 2 (two) times daily.   Saw Palmetto 500 MG CAPS, Take 1,000 mg by mouth 2 (two) times daily.    Objective  Blood pressure 138/64, pulse 60, height 5\' 5"  (1.651 m), weight 144 lb (65.3 kg), SpO2 96%.   General: No apparent distress alert and oriented x3 mood and affect normal, dressed appropriately.  HEENT: Pupils equal, extraocular movements intact  Respiratory: Patient's speak in full sentences and does not appear short of breath  Cardiovascular: No lower extremity edema, non tender, no erythema  Back exam shows mild loss  lordosis, still atrophy of the gluteal muscles bilaterally.  Patient does have a get out of a seated position without any difficulty.    Impression and Recommendations:    The above documentation has been reviewed and is accurate and complete Judi Saa, DO

## 2023-07-03 ENCOUNTER — Ambulatory Visit (INDEPENDENT_AMBULATORY_CARE_PROVIDER_SITE_OTHER): Payer: Medicare Other

## 2023-07-03 DIAGNOSIS — Z23 Encounter for immunization: Secondary | ICD-10-CM

## 2023-07-03 NOTE — Progress Notes (Signed)
Pt came in for his flu vaccine gave high dose . Tolerated well and had no concerns today

## 2023-07-06 ENCOUNTER — Encounter: Payer: Self-pay | Admitting: Family Medicine

## 2023-07-06 ENCOUNTER — Ambulatory Visit: Payer: Medicare Other | Admitting: Family Medicine

## 2023-07-06 VITALS — BP 138/64 | HR 60 | Ht 65.0 in | Wt 144.0 lb

## 2023-07-06 DIAGNOSIS — M48062 Spinal stenosis, lumbar region with neurogenic claudication: Secondary | ICD-10-CM | POA: Diagnosis not present

## 2023-07-06 NOTE — Assessment & Plan Note (Signed)
Patient is doing extremely well at this time.  Can always do the epidural if needed as well.  Discussed which activities to do and which ones to avoid.  Increase activity slowly otherwise.  Follow-up with me again in 6 to 8 weeks

## 2023-07-06 NOTE — Patient Instructions (Addendum)
If you need help with wife we can order as we said [email or web address] See you again as needed

## 2023-07-27 ENCOUNTER — Telehealth (HOSPITAL_COMMUNITY): Payer: Self-pay | Admitting: *Deleted

## 2023-07-27 ENCOUNTER — Encounter (HOSPITAL_COMMUNITY): Payer: Self-pay

## 2023-07-27 NOTE — Telephone Encounter (Signed)

## 2023-07-28 ENCOUNTER — Ambulatory Visit (HOSPITAL_COMMUNITY)
Admission: RE | Admit: 2023-07-28 | Discharge: 2023-07-28 | Disposition: A | Discharge status: Home / Self Care | Payer: Medicare Other | Source: Ambulatory Visit | Attending: Internal Medicine | Admitting: Internal Medicine

## 2023-07-28 ENCOUNTER — Other Ambulatory Visit: Payer: Self-pay | Admitting: Internal Medicine

## 2023-07-28 DIAGNOSIS — I421 Obstructive hypertrophic cardiomyopathy: Secondary | ICD-10-CM

## 2023-07-28 MED ORDER — GADOBUTROL 1 MMOL/ML IV SOLN
7.0000 mL | Freq: Once | INTRAVENOUS | Status: AC | PRN
  Administered 2023-07-28: 7 mL via INTRAVENOUS

## 2023-08-31 ENCOUNTER — Other Ambulatory Visit: Payer: Self-pay | Admitting: Family Medicine

## 2023-09-08 ENCOUNTER — Other Ambulatory Visit: Payer: Self-pay | Admitting: Pulmonary Disease

## 2023-09-09 ENCOUNTER — Telehealth: Payer: Self-pay | Admitting: Pulmonary Disease

## 2023-09-09 MED ORDER — ALBUTEROL SULFATE HFA 108 (90 BASE) MCG/ACT IN AERS: 1.0000 | INHALATION_SPRAY | RESPIRATORY_TRACT | 1 refills | Status: DC | PRN

## 2023-09-09 NOTE — Telephone Encounter (Signed)
Patient was denied refill of albuterol inhaler. Patient was overdue for an appointment, made appt for 10/14/2023 with Dr. Tonia Brooms. Please advise if we can send in courtesy refill to last until then. Patient is completely out of medication.

## 2023-09-09 NOTE — Telephone Encounter (Signed)
Spoke with patient regading prior message. Patient has been out of his his albuterol and does have a f/u with Dr.Icard on 10/14/2023. Approved of patient with 1 refill to last until office visit. Patient was advised to keep office visit to get further refill. Patient's voice was understanding.nothing else further needed.

## 2023-09-15 ENCOUNTER — Ambulatory Visit: Payer: Medicare Other | Admitting: Family Medicine

## 2023-09-15 ENCOUNTER — Encounter: Payer: Self-pay | Admitting: Family Medicine

## 2023-09-15 VITALS — BP 134/58 | HR 71 | Temp 97.8°F | Ht 65.0 in | Wt 143.1 lb

## 2023-09-15 DIAGNOSIS — L03116 Cellulitis of left lower limb: Secondary | ICD-10-CM

## 2023-09-15 MED ORDER — CEPHALEXIN 500 MG PO CAPS: 500.0000 mg | ORAL_CAPSULE | Freq: Two times a day (BID) | ORAL | 0 refills | Status: AC

## 2023-09-15 NOTE — Patient Instructions (Signed)
Follow up by MyChart message on Friday and let me know how things are looking START the Cephalexin twice daily FINISH the Doxycycline as prescribed Apply cool compresses to the wounds to help soften the scabs If legs are worsening or failing to improve- please let me know and we'll get you back in here Call with any questions or concerns Hang in there! Happy Holidays!!!

## 2023-09-15 NOTE — Progress Notes (Signed)
   Subjective:    Patient ID: [name], male    DOB: [birth year], 88 y.o.   MRN: [id number]  HPI Fall- pt tripped going up the stairs and scraped both shins.  Occurred 8 days ago.  Areas are red, warm, and painful.  Pt has been taking Doxycycline x6 days but legs are getting worse rather than better.  Wife reports the upper scab on his L leg was not red before but now is.  Is UTD on Tdap.    Review of Systems For ROS see HPI     Objective:   Physical Exam Vitals reviewed.  Constitutional:      General: He is not in acute distress.    Appearance: Normal appearance. He is not ill-appearing.  HENT:     Head: Normocephalic and atraumatic.  Skin:    Findings: Wound (has 2 scabbed areas on L lower leg- both erythematous, upper scab is also warm to touch.  R lower leg w/ 1 scab on anterior shin, also surrounded by erythema but not warm to touch) present.       Neurological:     Mental Status: He is alert. Mental status is at baseline.  Psychiatric:        Mood and Affect: Mood normal.        Behavior: Behavior normal.        Thought Content: Thought content normal.           Assessment & Plan:  Cellulitis of L LE- new.  His R leg and L lower scab both have surrounding erythema but are not warm to touch.  However the L upper scab is very warm to touch, red, and painful.  Wife reports this is new in the last few days- despite Doxycycline.  Will add Keflex and pt and wife are supposed to monitor closely and update me via MyChart.  Reviewed wound care and red flags that should prompt return.  Pt expressed understanding and is in agreement w/ plan.

## 2023-10-14 ENCOUNTER — Encounter: Payer: Self-pay | Admitting: Pulmonary Disease

## 2023-10-14 ENCOUNTER — Ambulatory Visit: Payer: Medicare Other | Admitting: Pulmonary Disease

## 2023-10-14 NOTE — Progress Notes (Deleted)
Synopsis: Referred in July 2020 for follow-up from pneumonia by Sheliah Hatch, MD  Subjective:   PATIENT ID: [name] GENDER: male DOB: 12-26-1934, MRN: [id number]  No chief complaint on file.   This is an 88 year old gentleman seen in clinic today to establish care with pulmonary after a follow-up for recent pneumonia.  Patient was seen initially by Buelah Manis, NP in January 2020 for evaluation of cough.  In March 2020 patient was seen for possible pneumonia.  He had an HRCT which showed clustered centrilobular and tree-in-bud nodular opacities within the right lung at the time considered relation to aspiration.  He had sputum cultures with normal growth and AFB was negative.  Treated with Brio inhaler.  He is a former smoker, smoked in [employer] cigars less than 2 years.  Has a past medical history of chronic cough, coronary artery disease hypertension.  In 2019 had a perforated ulcer.  Patient was initially seen and evaluated by Dr. Marchelle Gearing.  He was seen as a pulmonary consultation in December 2019.  This was during his hospitalization for a perforated gastric ulcer status post Cheree Ditto patch.  Non smoker, [employer], was in [place] at [employer], masters in [employer] then went to [employer]. Joined [employer] afterwards. 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 It was used for planning purposes. 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 He worked for [employer] 35 years and retired in 2000.  Of note the patient is from [facility] and grew up on [place].  Patient has been seen in the past by ear nose and throat for chronic nasal congestion.  He has a history of polyps that have been removed in the past.  He is currently on a regular nasal spray.  He still feels like his congestion in his nose makes his breathing worse at times.  OV 04/07/2022: Here today for follow-up regarding management of  mild intermittent asthma.  Doing really well.  Has not been seen by me since 2020.  From respiratory standpoint doing well.  He intermittently uses his Virgel Bouquet because he is worried about falling into the donut hole too soon in October.  This increases cost of medication significantly.  So he sparingly uses it.  But he does breathe better when he is able to get the medication and use it daily.  OV 10/14/2023: Here today for follow-up.  Mild intermittent asthma. ***    Past Medical History:  Diagnosis Date   Allergy    Arthritis    "scattered" (10/26/2018)   Asthma    C. difficile diarrhea    CAD (coronary artery disease) 2005   s/p PCI of RCA   Carotid stenosis    1 to 39% right carotid artery stenosis and 40 to 59% left carotid artery stenosis by Dopplers 01/2023   Cataract    NS OD   Cognitive impairment, mild, so stated    Colitis    Erectile dysfunction    GERD (gastroesophageal reflux disease)    Hyperlipidemia    Hypertension    Perforated stomach, acute 10/23/2018   Peripheral vascular disease (HCC)    Retinal detachment    Vertebrobasilar artery syndrome    Vertigo      Family History  Problem Relation Age of Onset   Stroke Mother    Hyperlipidemia Mother    Hypertension Mother  Deep vein thrombosis Mother    Stroke Father    Hyperlipidemia Father    Hypertension Father    Deep vein thrombosis Father    Diverticulitis Father    Ulcerative colitis Father    Colon polyps Father    Colon cancer Neg Hx    Esophageal cancer Neg Hx    Stomach cancer Neg Hx    Rectal cancer Neg Hx      Past Surgical History:  Procedure Laterality Date   APPENDECTOMY  1954   CATARACT EXTRACTION Left 08/08/2019   Dr. Hazle Quant   COLONOSCOPY     ESOPHAGOGASTRODUODENOSCOPY (EGD) WITH ESOPHAGEAL DILATION  09/2018   EYE SURGERY     LAPAROTOMY N/A 10/23/2018   Procedure: EXPLORATORY LAPAROTOMY WITH GRAHAM PATCH FOR PERFORATED ULCER AND PLACEMENT OF GASTRIC TUBE;  Surgeon: Emelia Loron, MD;  Location: [facility] OR;  Service: General;  Laterality: N/A;   NASAL SINUS SURGERY  05/2018   RETINAL DETACHMENT SURGERY     "? side"   UPPER GASTROINTESTINAL ENDOSCOPY      Social History   Socioeconomic History   Marital status: Married    Spouse name: Not on file   Number of children: 3   Years of education: Not on file   Highest education level: Master's degree (e.g., MA, MS, MEng, MEd, MSW, MBA)  Occupational History   Occupation: Retired Teaching laboratory technician: RETIRED  Tobacco Use   Smoking status: Former    Types: Cigars    Quit date: 05/13/1961    Years since quitting: 62.4    Passive exposure: Never   Smokeless tobacco: Never   Tobacco comments:    "never inhaled" in the early 60's   Vaping Use   Vaping status: Never Used  Substance and Sexual Activity   Alcohol use: Yes    Alcohol/week: 14.0 standard drinks of alcohol    Types: 14 Glasses of wine per week   Drug use: Never   Sexual activity: Not Currently  Other Topics Concern   Not on file  Social History Narrative   Married with 1 son and 2 daughters.  Retired.   2 caffeinated beverages daily, 3 glasses of wine daily   Never smoker no drug use no tobacco   Social Drivers of Corporate investment banker Strain: Low Risk  (09/14/2023)   Overall Financial Resource Strain (CARDIA)    Difficulty of Paying Living Expenses: Not hard at all  Food Insecurity: No Food Insecurity (09/14/2023)   Hunger Vital Sign    Worried About Running Out of Food in the Last Year: Never true    Ran Out of Food in the Last Year: Never true  Transportation Needs: No Transportation Needs (09/14/2023)   PRAPARE - Administrator, Civil Service (Medical): No    Lack of Transportation (Non-Medical): No  Physical Activity: Insufficiently Active (09/14/2023)   Exercise Vital Sign    Days of Exercise per Week: 7 days    Minutes of Exercise per Session: 10 min  Stress: No Stress Concern Present  (09/14/2023)   [employer] of Occupational Health - Occupational Stress Questionnaire    Feeling of Stress : Not at all  Social Connections: Moderately Integrated (09/14/2023)   Social Connection and Isolation Panel [NHANES]    Frequency of Communication with Friends and Family: More than three times a week    Frequency of Social Gatherings with Friends and Family: Three times a week    Attends  Religious Services: Never    Active Member of Clubs or Organizations: No    Attends Banker Meetings: More than 4 times per year    Marital Status: Married  Catering manager Violence: Not At Risk (04/09/2023)   Humiliation, Afraid, Rape, and Kick questionnaire    Fear of Current or Ex-Partner: No    Emotionally Abused: No    Physically Abused: No    Sexually Abused: No     Allergies  Allergen Reactions   Neosporin [Bacitracin-Polymyxin B] Other (See Comments)    Redness     Outpatient Medications Prior to Visit  Medication Sig Dispense Refill   acetaminophen (TYLENOL) 500 MG tablet Take 500 mg by mouth 2 (two) times daily.     albuterol (PROVENTIL) (2.5 MG/3ML) 0.083% nebulizer solution Take 3 mLs (2.5 mg total) by nebulization every 6 (six) hours as needed for wheezing or shortness of breath. 90 vial 3   albuterol (VENTOLIN HFA) 108 (90 Base) MCG/ACT inhaler Inhale 1-2 puffs into the lungs every 4 (four) hours as needed for wheezing or shortness of breath. Patient need to keep office visit to get more refill's . 8.5 g 1   amLODipine (NORVASC) 5 MG tablet Take 1 tablet (5 mg total) by mouth daily. 90 tablet 3   aspirin EC 81 MG tablet Take 81 mg by mouth daily.     atorvastatin (LIPITOR) 80 MG tablet TAKE 1 TABLET(80 MG) BY MOUTH DAILY 90 tablet 3   atorvastatin (LIPITOR) 80 MG tablet Take 1 tablet (80 mg total) by mouth daily. Please keep upcoming appt.with provider in June to receive future refills. Thank You. 90 tablet 0   azelastine (ASTELIN) 0.1 % nasal spray USE 1  SPRAY IN EACH NOSTRIL TWICE DAILY 30 mL 5   carvedilol (COREG) 6.25 MG tablet Take 1 tablet (6.25 mg total) by mouth 2 (two) times daily with a meal. Please keep upcoming appt for future refills Thanks 180 tablet 3   cholecalciferol (VITAMIN D3) 25 MCG (1000 UT) tablet Take 1,000 Units by mouth daily.     donepezil (ARICEPT) 10 MG tablet Take 1 tablet (10 mg total) by mouth daily. 90 tablet 1   doxycycline (ADOXA) 100 MG tablet Take 100 mg by mouth 2 (two) times daily.     Eyelid Cleansers (OCUSOFT EYELID CLEANSING) PADS Place 1 application into both eyes 2 (two) times daily.     fish oil-omega-3 fatty acids 1000 MG capsule Take 2 capsules (2 g total) by mouth 2 (two) times daily. 360 capsule 3   fluticasone (FLONASE) 50 MCG/ACT nasal spray 1 spray each nostril twice daily 16 g 11   fluticasone furoate-vilanterol (BREO ELLIPTA) 100-25 MCG/ACT AEPB INHALE 1 PUFF INTO THE LUNGS DAILY 60 each 3   gabapentin (NEURONTIN) 300 MG capsule TAKE 1 CAPSULE(300 MG) BY MOUTH THREE TIMES DAILY 90 capsule 3   hydrochlorothiazide (HYDRODIURIL) 25 MG tablet TAKE 1 TABLET(25 MG) BY MOUTH DAILY 90 tablet 3   hydrochlorothiazide (HYDRODIURIL) 25 MG tablet Take 1 tablet (25 mg total) by mouth daily. Please keep upcoming appt.with provider in June to receive future refills. Thank You. 90 tablet 0   montelukast (SINGULAIR) 10 MG tablet TAKE 1 TABLET BY MOUTH AT BEDTIME GENERIC EQUIVALENT FOR SINGULAIR 90 tablet 3   Polyvinyl Alcohol-Povidone (REFRESH OP) Place 1 drop into both eyes 2 (two) times daily.     Saw Palmetto 500 MG CAPS Take 1,000 mg by mouth 2 (two) times daily.  No facility-administered medications prior to visit.    ROS   Objective:  Physical Exam   There were no vitals filed for this visit.    on RA BMI Readings from Last 3 Encounters:  09/15/23 23.82 kg/m  07/06/23 23.96 kg/m  06/17/23 23.63 kg/m   Wt Readings from Last 3 Encounters:  09/15/23 143 lb 2 oz (64.9 kg)  07/06/23 144  lb (65.3 kg)  06/17/23 142 lb (64.4 kg)     CBC    Component Value Date/Time   WBC 7.4 05/28/2023 0821   RBC 4.50 05/28/2023 0821   HGB 13.2 05/28/2023 0821   HCT 40.0 05/28/2023 0821   PLT 232.0 11/17/2022 1045   MCV 88.8 05/28/2023 0821   MCH 28.7 11/02/2020 1428   MCHC 32.9 05/28/2023 0821   RDW 13.6 05/28/2023 0821   LYMPHSABS 1.9 05/28/2023 0821   MONOABS 0.8 05/28/2023 0821   EOSABS 0.1 05/28/2023 0821   BASOSABS 0.1 05/28/2023 0821    Chest Imaging: 12/27/2018 HRCT of the chest: There is evidence of clustered centrilobular and tree-in-bud nodular opacities within the right lung and some consolidation within the right base.  With distribution was concern for potential aspiration. The patient's images have been independently reviewed by me.    03/16/2018 Clinical Impression Pt presents with functional swallow marked by adequate mastication, timely swallow response, consistent, high penetration of thin liquids (penetrate coats underside of epiglottis and is ejected upon descent of larynx - never reaches vocal folds) - this is considered normal,  #2 on the Penetration-Aspiration Scale.  There is no residue remaining in pharynx post-swallow.  A 13 mm barium pill passed easily through esophagus.  After cessation of exam, pt began coughing, and he and his wife affirmed that this behavior was consistent with the issue he has been describing. Instructed pt to observe if he expectorated barium at any time subsequent to the MBS.  Two hours later, pt called the office to  inform SLP that he expectorated material that he described as looking like the barium he ingested.  No oropharyngeal dysphagia noted on today's exam, but pt would benefit from further esophageal work-up given his report.   SLP Visit Diagnosis Dysphagia, unspecified (R13.10)     Pulmonary Functions Testing Results:    Latest Ref Rng & Units 01/12/2019   11:33 AM  PFT Results  FVC-Pre L 2.77  P  FVC-Predicted Pre % 87   P  FVC-Post L 2.75  P  FVC-Predicted Post % 86  P  Pre FEV1/FVC % % 78  P  Post FEV1/FCV % % 83  P  FEV1-Pre L 2.16  P  FEV1-Predicted Pre % 97  P  FEV1-Post L 2.29  P  DLCO uncorrected ml/min/mmHg 15.59  P  DLCO UNC% % 75  P  DLVA Predicted % 81  P  TLC L 5.42  P  TLC % Predicted % 89  P  RV % Predicted % 103  P    P Preliminary result     FeNO:  Lab Results  Component Value Date   NITRICOXIDE 23 12/29/2018    Pathology: None   Echocardiogram: None   Heart Catheterization: None     Assessment & Plan:   No diagnosis found.   Discussion:  This is a 88 year old gentleman with a history of a right lower lobe pneumonia had a hospitalization several years ago.  2019 had a swallow study which showed some unspecified dysphagia.  Follow-up imaging showed improvement in his  pneumonia.  He had chronic cough and nasal congestion with polyps removed by ENT.  Plan: Continue management with mild asthma with Breo Ellipta. [employer] application to help with medication assistance. Have them get him on a regimen that he can use every single day to prevent these small changes in patient's symptomatology. Samples today of Breo to help with cost. Patient to follow-up with [place] in 1 year or as needed.   Current Outpatient Medications:    acetaminophen (TYLENOL) 500 MG tablet, Take 500 mg by mouth 2 (two) times daily., Disp: , Rfl:    albuterol (PROVENTIL) (2.5 MG/3ML) 0.083% nebulizer solution, Take 3 mLs (2.5 mg total) by nebulization every 6 (six) hours as needed for wheezing or shortness of breath., Disp: 90 vial, Rfl: 3   albuterol (VENTOLIN HFA) 108 (90 Base) MCG/ACT inhaler, Inhale 1-2 puffs into the lungs every 4 (four) hours as needed for wheezing or shortness of breath. Patient need to keep office visit to get more refill's ., Disp: 8.5 g, Rfl: 1   amLODipine (NORVASC) 5 MG tablet, Take 1 tablet (5 mg total) by mouth daily., Disp: 90 tablet, Rfl: 3   aspirin EC 81 MG tablet, Take 81 mg  by mouth daily., Disp: , Rfl:    atorvastatin (LIPITOR) 80 MG tablet, TAKE 1 TABLET(80 MG) BY MOUTH DAILY, Disp: 90 tablet, Rfl: 3   atorvastatin (LIPITOR) 80 MG tablet, Take 1 tablet (80 mg total) by mouth daily. Please keep upcoming appt.with provider in June to receive future refills. Thank You., Disp: 90 tablet, Rfl: 0   azelastine (ASTELIN) 0.1 % nasal spray, USE 1 SPRAY IN EACH NOSTRIL TWICE DAILY, Disp: 30 mL, Rfl: 5   carvedilol (COREG) 6.25 MG tablet, Take 1 tablet (6.25 mg total) by mouth 2 (two) times daily with a meal. Please keep upcoming appt for future refills Thanks, Disp: 180 tablet, Rfl: 3   cholecalciferol (VITAMIN D3) 25 MCG (1000 UT) tablet, Take 1,000 Units by mouth daily., Disp: , Rfl:    donepezil (ARICEPT) 10 MG tablet, Take 1 tablet (10 mg total) by mouth daily., Disp: 90 tablet, Rfl: 1   doxycycline (ADOXA) 100 MG tablet, Take 100 mg by mouth 2 (two) times daily., Disp: , Rfl:    Eyelid Cleansers (OCUSOFT EYELID CLEANSING) PADS, Place 1 application into both eyes 2 (two) times daily., Disp: , Rfl:    fish oil-omega-3 fatty acids 1000 MG capsule, Take 2 capsules (2 g total) by mouth 2 (two) times daily., Disp: 360 capsule, Rfl: 3   fluticasone (FLONASE) 50 MCG/ACT nasal spray, 1 spray each nostril twice daily, Disp: 16 g, Rfl: 11   fluticasone furoate-vilanterol (BREO ELLIPTA) 100-25 MCG/ACT AEPB, INHALE 1 PUFF INTO THE LUNGS DAILY, Disp: 60 each, Rfl: 3   gabapentin (NEURONTIN) 300 MG capsule, TAKE 1 CAPSULE(300 MG) BY MOUTH THREE TIMES DAILY, Disp: 90 capsule, Rfl: 3   hydrochlorothiazide (HYDRODIURIL) 25 MG tablet, TAKE 1 TABLET(25 MG) BY MOUTH DAILY, Disp: 90 tablet, Rfl: 3   hydrochlorothiazide (HYDRODIURIL) 25 MG tablet, Take 1 tablet (25 mg total) by mouth daily. Please keep upcoming appt.with provider in June to receive future refills. Thank You., Disp: 90 tablet, Rfl: 0   montelukast (SINGULAIR) 10 MG tablet, TAKE 1 TABLET BY MOUTH AT BEDTIME GENERIC EQUIVALENT FOR  SINGULAIR, Disp: 90 tablet, Rfl: 3   Polyvinyl Alcohol-Povidone (REFRESH OP), Place 1 drop into both eyes 2 (two) times daily., Disp: , Rfl:    Saw Palmetto 500 MG CAPS, Take 1,000  mg by mouth 2 (two) times daily. , Disp: , Rfl:    Josephine Igo, DO  Pulmonary Critical Care 10/14/2023 9:42 AM

## 2023-10-16 ENCOUNTER — Ambulatory Visit: Payer: Medicare Other | Admitting: Primary Care

## 2023-10-16 ENCOUNTER — Other Ambulatory Visit (HOSPITAL_COMMUNITY): Payer: Self-pay

## 2023-10-16 ENCOUNTER — Telehealth: Payer: Self-pay | Admitting: Primary Care

## 2023-10-16 ENCOUNTER — Encounter: Payer: Self-pay | Admitting: Primary Care

## 2023-10-16 VITALS — BP 114/70 | HR 54 | Ht 65.0 in | Wt 145.6 lb

## 2023-10-16 DIAGNOSIS — J45909 Unspecified asthma, uncomplicated: Secondary | ICD-10-CM | POA: Diagnosis not present

## 2023-10-16 NOTE — Telephone Encounter (Signed)
Per test claim showing $90.00 for 1 inhaler

## 2023-10-16 NOTE — Progress Notes (Unsigned)
@Patient  ID: [name], male    DOB: 1935/06/02, 88 y.o.   MRN: [id number]  Chief Complaint  Patient presents with   Follow-up    1 year follow up , patient has no new concerns     Referring provider: Sheliah Hatch, MD  HPI:   This is an 88 year old gentleman seen in clinic today to establish care with pulmonary after a follow-up for recent pneumonia.  Patient was seen initially by Buelah Manis, NP in January 2020 for evaluation of cough.  In March 2020 patient was seen for possible pneumonia.  He had an HRCT which showed clustered centrilobular and tree-in-bud nodular opacities within the right lung at the time considered relation to aspiration.  He had sputum cultures with normal growth and AFB was negative.  Treated with Brio inhaler.  He is a former smoker, smoked in [employer] cigars less than 2 years.  Has a past medical history of chronic cough, coronary artery disease hypertension.  In 2019 had a perforated ulcer.  Patient was initially seen and evaluated by Dr. Marchelle Gearing.  He was seen as a pulmonary consultation in December 2019.  This was during his hospitalization for a perforated gastric ulcer status post Cheree Ditto patch.  Non smoker, [employer], was in [place] at [employer], masters in [employer] then went to [employer]. Joined [employer] afterwards. 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 It was used for planning purposes. 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 He worked for [employer] 35 years and retired in 2000.  Of note the patient is from [place] and grew up on [place].  Patient has been seen in the past by ear nose and throat for chronic nasal congestion.  He has a history of polyps that have been removed in the past.  He is currently on a regular nasal spray.  He still feels like his congestion in his nose makes his breathing worse at times.  OV 04/07/2022: Here today  for follow-up regarding management of mild intermittent asthma.  Doing really well.  Has not been seen by me since 2020.  From respiratory standpoint doing well.  He intermittently uses his Virgel Bouquet because he is worried about falling into the donut hole too soon in October.  This increases cost of medication significantly.  So he sparingly uses it.  But he does breathe better when he is able to get the medication and use it daily.   10/16/2023 Discussed the use of AI scribe software for clinical note transcription with the patient, who gave verbal consent to proceed.  History of Present Illness   The patient, with a history of pneumonia, mild intermittent asthma, and nasal polyps, was last seen in June 2023. She has been managed for asthma and unspecified dysphagia following pneumonia. The patient has been on daily Breo inhaler for asthma management. Since the last visit, the patient reports no respiratory issues, no recurrent pneumonias, and no hospitalizations. She denies any bronchitis or upper respiratory infections. The patient occasionally experiences coughing but denies any wheezing. She admits to sometimes forgetting to take the [facility] inhaler daily, but does not notice any worsening symptoms on the days it is missed. The patient does not recall having asthma as a child. She also reports daily use of a nasal spray due to postnasal drip. The patient has albuterol as a rescue inhaler for  emergency use.          Allergies  Allergen Reactions   Neosporin [Bacitracin-Polymyxin B] Other (See Comments)    Redness    Immunization History  Administered Date(s) Administered   Fluad Quad(high Dose 65+) 06/24/2019   Fluad Trivalent(High Dose 65+) 07/03/2023   Influenza Split 08/25/2011, 08/09/2012   Influenza Whole 07/11/2010   Influenza, High Dose Seasonal PF 08/14/2014, 07/16/2018   Influenza,inj,Quad PF,6+ Mos 08/11/2013, 08/18/2014, 07/11/2015, 07/14/2016, 07/20/2017   Influenza-Unspecified  07/24/2000, 07/27/2022   PFIZER(Purple Top)SARS-COV-2 Vaccination 11/15/2019, 12/06/2019, 07/27/2020, 03/08/2021   Pneumococcal Conjugate-13 01/25/2015   Pneumococcal Polysaccharide-23 09/15/2002   Td 12/13/2007, 05/31/2019   Zoster Recombinant(Shingrix) 05/29/2020, 08/14/2020   Zoster, Live 10/29/2010    Past Medical History:  Diagnosis Date   Allergy    Arthritis    "scattered" (10/26/2018)   Asthma    C. difficile diarrhea    CAD (coronary artery disease) 2005   s/p PCI of RCA   Carotid stenosis    1 to 39% right carotid artery stenosis and 40 to 59% left carotid artery stenosis by Dopplers 01/2023   Cataract    NS OD   Cognitive impairment, mild, so stated    Colitis    Erectile dysfunction    GERD (gastroesophageal reflux disease)    Hyperlipidemia    Hypertension    Perforated stomach, acute 10/23/2018   Peripheral vascular disease (HCC)    Retinal detachment    Vertebrobasilar artery syndrome    Vertigo     Tobacco History: Social History   Tobacco Use  Smoking Status Former   Types: Cigars   Quit date: 05/13/1961   Years since quitting: 62.4   Passive exposure: Never  Smokeless Tobacco Never  Tobacco Comments   "never inhaled" in the early 60's    Counseling given: Not Answered Tobacco comments: "never inhaled" in the early 60's    Outpatient Medications Prior to Visit  Medication Sig Dispense Refill   acetaminophen (TYLENOL) 500 MG tablet Take 500 mg by mouth 2 (two) times daily.     albuterol (PROVENTIL) (2.5 MG/3ML) 0.083% nebulizer solution Take 3 mLs (2.5 mg total) by nebulization every 6 (six) hours as needed for wheezing or shortness of breath. 90 vial 3   albuterol (VENTOLIN HFA) 108 (90 Base) MCG/ACT inhaler Inhale 1-2 puffs into the lungs every 4 (four) hours as needed for wheezing or shortness of breath. Patient need to keep office visit to get more refill's . 8.5 g 1   amLODipine (NORVASC) 5 MG tablet Take 1 tablet (5 mg total) by mouth  daily. 90 tablet 3   aspirin EC 81 MG tablet Take 81 mg by mouth daily.     atorvastatin (LIPITOR) 80 MG tablet TAKE 1 TABLET(80 MG) BY MOUTH DAILY 90 tablet 3   atorvastatin (LIPITOR) 80 MG tablet Take 1 tablet (80 mg total) by mouth daily. Please keep upcoming appt.with provider in June to receive future refills. Thank You. 90 tablet 0   azelastine (ASTELIN) 0.1 % nasal spray USE 1 SPRAY IN EACH NOSTRIL TWICE DAILY 30 mL 5   carvedilol (COREG) 6.25 MG tablet Take 1 tablet (6.25 mg total) by mouth 2 (two) times daily with a meal. Please keep upcoming appt for future refills Thanks 180 tablet 3   cholecalciferol (VITAMIN D3) 25 MCG (1000 UT) tablet Take 1,000 Units by mouth daily.     donepezil (ARICEPT) 10 MG tablet Take 1 tablet (10 mg total) by mouth daily. 90 tablet  1   doxycycline (ADOXA) 100 MG tablet Take 100 mg by mouth 2 (two) times daily.     Eyelid Cleansers (OCUSOFT EYELID CLEANSING) PADS Place 1 application into both eyes 2 (two) times daily.     fish oil-omega-3 fatty acids 1000 MG capsule Take 2 capsules (2 g total) by mouth 2 (two) times daily. 360 capsule 3   fluticasone (FLONASE) 50 MCG/ACT nasal spray 1 spray each nostril twice daily 16 g 11   fluticasone furoate-vilanterol (BREO ELLIPTA) 100-25 MCG/ACT AEPB INHALE 1 PUFF INTO THE LUNGS DAILY 60 each 3   gabapentin (NEURONTIN) 300 MG capsule TAKE 1 CAPSULE(300 MG) BY MOUTH THREE TIMES DAILY 90 capsule 3   hydrochlorothiazide (HYDRODIURIL) 25 MG tablet TAKE 1 TABLET(25 MG) BY MOUTH DAILY 90 tablet 3   hydrochlorothiazide (HYDRODIURIL) 25 MG tablet Take 1 tablet (25 mg total) by mouth daily. Please keep upcoming appt.with provider in June to receive future refills. Thank You. 90 tablet 0   montelukast (SINGULAIR) 10 MG tablet TAKE 1 TABLET BY MOUTH AT BEDTIME GENERIC EQUIVALENT FOR SINGULAIR 90 tablet 3   Polyvinyl Alcohol-Povidone (REFRESH OP) Place 1 drop into both eyes 2 (two) times daily.     Saw Palmetto 500 MG CAPS Take 1,000  mg by mouth 2 (two) times daily.      No facility-administered medications prior to visit.      Review of Systems  Review of Systems   Physical Exam  BP 114/70   Pulse (!) 54   Ht 5\' 5"  (1.651 m)   Wt 145 lb 9.6 oz (66 kg)   SpO2 98% Comment: ROOM AIR  BMI 24.23 kg/m  Physical Exam   Lab Results:  CBC    Component Value Date/Time   WBC 7.4 05/28/2023 0821   RBC 4.50 05/28/2023 0821   HGB 13.2 05/28/2023 0821   HCT 40.0 05/28/2023 0821   PLT 232.0 11/17/2022 1045   MCV 88.8 05/28/2023 0821   MCH 28.7 11/02/2020 1428   MCHC 32.9 05/28/2023 0821   RDW 13.6 05/28/2023 0821   LYMPHSABS 1.9 05/28/2023 0821   MONOABS 0.8 05/28/2023 0821   EOSABS 0.1 05/28/2023 0821   BASOSABS 0.1 05/28/2023 0821    BMET    Component Value Date/Time   NA 141 05/14/2023 1408   NA 138 09/09/2019 0831   K 4.1 05/14/2023 1408   CL 102 05/14/2023 1408   CO2 30 05/14/2023 1408   GLUCOSE 90 05/14/2023 1408   GLUCOSE 98 10/04/2010 0000   BUN 23 05/14/2023 1408   BUN 8 09/09/2019 0831   CREATININE 1.02 05/14/2023 1408   CREATININE 1.09 11/02/2020 1428   CALCIUM 9.8 05/14/2023 1408   GFRNONAA 72 09/09/2019 0831   GFRAA 83 09/09/2019 0831    BNP No results found for: "BNP"  ProBNP No results found for: "PROBNP"  Imaging: No results found.   Assessment & Plan:    Assessment and Plan    Mild Intermittent Asthma No recent exacerbations or hospitalizations. Infrequent cough, no wheezing or shortness of breath. Currently on Breo, but patient reports occasional missed doses without noticeable worsening of symptoms. Discussed the possibility of using Breo as needed due to cost and lack of daily symptoms. -Use Breo as needed instead of daily. -Continue to have Albuterol rescue inhaler available for sudden onset of symptoms. -Consider Airsupra as a potential alternative to [facility], pending cost information from pharmacy. -Follow up in one year if still using Breo, otherwise primary  care can manage.  Chronic Rhinitis Daily postnasal drip managed with nasal sprays and Zyrtec. -Continue current regimen.  History of Pneumonia No recent episodes or hospitalizations. -Monitor for symptoms, seek medical attention if symptoms of pneumonia develop.         Clinical symptoms are consistent with mild intermittent asthma improved on low dose ICS/LABA. PFTs in March 2020 were normal.  - Stable interval; No acute respiratory symptoms. He has a chronic cough. Repeat CT imaging in August 2020 showed resolution of previous PNA.  - Continue Breo 100 one puff daily in morning - Will ask pharmacy to help see if there is a more affordable ICS/LABA on his insurance plan as he will be approaching donut hole in August     Glenford Bayley, NP 10/16/2023

## 2023-10-16 NOTE — Telephone Encounter (Signed)
[name] is formulary on insurance when I go to prescribed, do you know the cost of this for the patient?

## 2023-10-16 NOTE — Patient Instructions (Addendum)
Exam was normal, lungs were clear   -MILD INTERMITTENT ASTHMA: Asthma is a condition where your airways narrow and swell, which can make breathing difficult. You have not had any recent asthma attacks or hospitalizations. You can start using your Breo inhaler as needed instead of daily, and continue to keep your Albuterol rescue inhaler on hand for sudden symptoms. We also discussed possibly switching to Airsupra, depending on the cost. Follow up in one year if you continue using Breo, otherwise your primary care doctor can manage your asthma.  -CHRONIC RHINITIS: Chronic rhinitis is long-term inflammation of the nasal passages, often causing a runny nose and postnasal drip. You should continue using your current nasal spray and Zyrtec to manage your symptoms.  -HISTORY OF PNEUMONIA: Pneumonia is an infection that inflames the air sacs in one or both lungs. You have not had any recent episodes. Please monitor for any symptoms and seek medical attention if you suspect pneumonia.  Follow-up: 1 year with Dr. Tonia Brooms or Waynetta Sandy NP

## 2023-11-02 ENCOUNTER — Ambulatory Visit: Payer: Medicare Other | Admitting: Family Medicine

## 2023-11-05 NOTE — Progress Notes (Signed)
 [name] [address] [place] [address] Phone: [phone] Subjective:   [name], am serving as a scribe for Dr. Arthea [name].  I'm seeing this patient by the request  of:  [name] BRAVO, MD  CC: Back pain follow-up  YEP:Dlagzrupcz  07/06/2023 Patient is doing extremely well at this time. Can always do the epidural if needed as well. Discussed which activities to do and which ones to avoid. Increase activity slowly otherwise. Follow-up with me again in 6 to 8 weeks   Updated 11/09/2023 [name] is a 89 y.o. male coming in with complaint of back pain. Patient states that he is doing well. Has pain as night in R GT but no pain throughout the day.  Patient states that overall is doing 90 to 100% better still.       Past Medical History:  Diagnosis Date   Allergy    Arthritis    scattered (10/26/2018)   Asthma    C. difficile diarrhea    CAD (coronary artery disease) 2005   s/p PCI of RCA   Carotid stenosis    1 to 39% right carotid artery stenosis and 40 to 59% left carotid artery stenosis by Dopplers 01/2023   Cataract    NS OD   Cognitive impairment, mild, so stated    Colitis    Erectile dysfunction    GERD (gastroesophageal reflux disease)    Hyperlipidemia    Hypertension    Perforated stomach, acute 10/23/2018   Peripheral vascular disease (HCC)    Retinal detachment    Vertebrobasilar artery syndrome    Vertigo    Past Surgical History:  Procedure Laterality Date   APPENDECTOMY  1954   CATARACT EXTRACTION Left 08/08/2019   Dr. Camillo   COLONOSCOPY     ESOPHAGOGASTRODUODENOSCOPY (EGD) WITH ESOPHAGEAL DILATION  09/2018   EYE SURGERY     LAPAROTOMY N/A 10/23/2018   Procedure: EXPLORATORY LAPAROTOMY WITH GRAHAM PATCH FOR PERFORATED ULCER AND PLACEMENT OF GASTRIC TUBE;  Surgeon: Ebbie Cough, MD;  Location: [facility] OR;  Service: General;  Laterality: N/A;   NASAL SINUS SURGERY  05/2018   RETINAL  DETACHMENT SURGERY     ? side   UPPER GASTROINTESTINAL ENDOSCOPY     Social History   Socioeconomic History   Marital status: Married    Spouse name: Not on file   Number of children: 3   Years of education: Not on file   Highest education level: Master's degree (e.g., MA, MS, MEng, MEd, MSW, MBA)  Occupational History   Occupation: Retired Teaching Laboratory Technician: RETIRED  Tobacco Use   Smoking status: Former    Types: Cigars    Quit date: 05/13/1961    Years since quitting: 62.5    Passive exposure: Never   Smokeless tobacco: Never   Tobacco comments:    never inhaled in the early 60's   Vaping Use   Vaping status: Never Used  Substance and Sexual Activity   Alcohol use: Yes    Alcohol/week: 14.0 standard drinks of alcohol    Types: 14 Glasses of wine per week   Drug use: Never   Sexual activity: Not Currently  Other Topics Concern   Not on file  Social History Narrative   Married with 1 son and 2 daughters.  Retired.   2 caffeinated beverages daily, 3 glasses of wine daily   Never smoker no drug use no tobacco  Social Drivers of Corporate Investment Banker Strain: Low Risk  (09/14/2023)   Overall Financial Resource Strain (CARDIA)    Difficulty of Paying Living Expenses: Not hard at all  Food Insecurity: No Food Insecurity (09/14/2023)   Hunger Vital Sign    Worried About Running Out of Food in the Last Year: Never true    Ran Out of Food in the Last Year: Never true  Transportation Needs: No Transportation Needs (09/14/2023)   PRAPARE - Administrator, Civil Service (Medical): No    Lack of Transportation (Non-Medical): No  Physical Activity: Insufficiently Active (09/14/2023)   Exercise Vital Sign    Days of Exercise per Week: 7 days    Minutes of Exercise per Session: 10 min  Stress: No Stress Concern Present (09/14/2023)   [employer] of Occupational Health - Occupational Stress Questionnaire    Feeling of Stress : Not at  all  Social Connections: Moderately Integrated (09/14/2023)   Social Connection and Isolation Panel [NHANES]    Frequency of Communication with Friends and Family: More than three times a week    Frequency of Social Gatherings with Friends and Family: Three times a week    Attends Religious Services: Never    Active Member of Clubs or Organizations: No    Attends Engineer, Structural: More than 4 times per year    Marital Status: Married   Allergies  Allergen Reactions   Neosporin [Bacitracin-Polymyxin B] Other (See Comments)    Redness   Family History  Problem Relation Age of Onset   Stroke Mother    Hyperlipidemia Mother    Hypertension Mother    Deep vein thrombosis Mother    Stroke Father    Hyperlipidemia Father    Hypertension Father    Deep vein thrombosis Father    Diverticulitis Father    Ulcerative colitis Father    Colon polyps Father    Colon cancer Neg Hx    Esophageal cancer Neg Hx    Stomach cancer Neg Hx    Rectal cancer Neg Hx      Current Outpatient Medications (Cardiovascular):    amLODipine  (NORVASC ) 5 MG tablet, Take 1 tablet (5 mg total) by mouth daily.   atorvastatin  (LIPITOR ) 80 MG tablet, TAKE 1 TABLET(80 MG) BY MOUTH DAILY   atorvastatin  (LIPITOR ) 80 MG tablet, Take 1 tablet (80 mg total) by mouth daily. Please keep upcoming appt.with provider in June to receive future refills. Thank You.   carvedilol  (COREG ) 6.25 MG tablet, Take 1 tablet (6.25 mg total) by mouth 2 (two) times daily with a meal. Please keep upcoming appt for future refills Thanks   hydrochlorothiazide  (HYDRODIURIL ) 25 MG tablet, TAKE 1 TABLET(25 MG) BY MOUTH DAILY   hydrochlorothiazide  (HYDRODIURIL ) 25 MG tablet, Take 1 tablet (25 mg total) by mouth daily. Please keep upcoming appt.with provider in June to receive future refills. Thank You.  Current Outpatient Medications (Respiratory):    albuterol  (PROVENTIL ) (2.5 MG/3ML) 0.083% nebulizer solution, Take 3 mLs (2.5 mg  total) by nebulization every 6 (six) hours as needed for wheezing or shortness of breath.   albuterol  (VENTOLIN  HFA) 108 (90 Base) MCG/ACT inhaler, Inhale 1-2 puffs into the lungs every 4 (four) hours as needed for wheezing or shortness of breath. Patient need to keep office visit to get more refill's .   azelastine  (ASTELIN ) 0.1 % nasal spray, USE 1 SPRAY IN EACH NOSTRIL TWICE DAILY   fluticasone  (FLONASE ) 50 MCG/ACT nasal spray, 1  spray each nostril twice daily   fluticasone  furoate-vilanterol (BREO ELLIPTA ) 100-25 MCG/ACT AEPB, INHALE 1 PUFF INTO THE LUNGS DAILY   montelukast  (SINGULAIR ) 10 MG tablet, TAKE 1 TABLET BY MOUTH AT BEDTIME GENERIC EQUIVALENT FOR SINGULAIR   Current Outpatient Medications (Analgesics):    acetaminophen  (TYLENOL ) 500 MG tablet, Take 500 mg by mouth 2 (two) times daily.   aspirin  EC 81 MG tablet, Take 81 mg by mouth daily.   Current Outpatient Medications (Other):    cholecalciferol (VITAMIN D3) 25 MCG (1000 UT) tablet, Take 1,000 Units by mouth daily.   donepezil  (ARICEPT ) 10 MG tablet, Take 1 tablet (10 mg total) by mouth daily.   doxycycline  (ADOXA) 100 MG tablet, Take 100 mg by mouth 2 (two) times daily.   Eyelid Cleansers (OCUSOFT EYELID CLEANSING) PADS, Place 1 application into both eyes 2 (two) times daily.   fish oil-omega-3 fatty acids  1000 MG capsule, Take 2 capsules (2 g total) by mouth 2 (two) times daily.   gabapentin  (NEURONTIN ) 300 MG capsule, TAKE 1 CAPSULE(300 MG) BY MOUTH THREE TIMES DAILY   Polyvinyl Alcohol-Povidone (REFRESH OP), Place 1 drop into both eyes 2 (two) times daily.   Saw Palmetto 500 MG CAPS, Take 1,000 mg by mouth 2 (two) times daily.     Objective  Blood pressure (!) 122/58, pulse 60, height 5' 5 (1.651 m), weight 147 lb (66.7 kg), SpO2 94%.   General: No apparent distress alert and oriented x3 mood and affect normal, dressed appropriately.  HEENT: Pupils equal, extraocular movements intact  Respiratory: Patient's speak in  full sentences and does not appear short of breath  Cardiovascular: No lower extremity edema, non tender, no erythema      Impression and Recommendations:    The above documentation has been reviewed and is accurate and complete Robbie Rideaux M Mckinley Adelstein, DO

## 2023-11-09 ENCOUNTER — Encounter: Payer: Self-pay | Admitting: Family Medicine

## 2023-11-09 ENCOUNTER — Ambulatory Visit: Payer: Medicare Other | Admitting: Family Medicine

## 2023-11-09 VITALS — BP 122/58 | HR 60 | Ht 65.0 in | Wt 147.0 lb

## 2023-11-09 DIAGNOSIS — G5701 Lesion of sciatic nerve, right lower limb: Secondary | ICD-10-CM

## 2023-11-09 DIAGNOSIS — M65261 Calcific tendinitis, right lower leg: Secondary | ICD-10-CM | POA: Diagnosis not present

## 2023-11-09 NOTE — Assessment & Plan Note (Signed)
 Patient has done remarkably well at this time.  Did respond well to this area in the piriformis as well as gluteal tendon injections previously.  At the moment I do not think we need to change management.  Follow-up with me as needed.

## 2023-11-09 NOTE — Assessment & Plan Note (Signed)
 Injected the hamstring over nearly 2 years ago at this point.  Patient is doing well and can consider the possibility of injections if needed.  Has the ossification noted of the tendons.  Follow-up with me again in 6 to 8 weeks of the next.

## 2023-11-09 NOTE — Patient Instructions (Signed)
 You are amazing Keep it up  If needed we can repeat the injection at some point but lets hold off See me when you need me!

## 2023-11-16 ENCOUNTER — Ambulatory Visit: Payer: Medicare Other | Admitting: Family Medicine

## 2023-11-16 ENCOUNTER — Encounter: Payer: Self-pay | Admitting: Family Medicine

## 2023-11-16 VITALS — BP 132/54 | HR 55 | Temp 97.8°F | Wt 145.5 lb

## 2023-11-16 DIAGNOSIS — I10 Essential (primary) hypertension: Secondary | ICD-10-CM | POA: Diagnosis not present

## 2023-11-16 DIAGNOSIS — E7800 Pure hypercholesterolemia, unspecified: Secondary | ICD-10-CM

## 2023-11-16 NOTE — Patient Instructions (Signed)
Schedule your complete physical in 6 months We'll notify you of your lab results and make any changes if needed Continue to work on healthy diet and regular exercise- you're doing great! Call with any questions or concerns Stay Safe!  Stay Healthy! Happy New Year!!! 

## 2023-11-16 NOTE — Assessment & Plan Note (Signed)
Chronic problem.  Well controlled today on Amlodipine, Carvedilol, and hydrochlorothiazide.  Currently asymptomatic.  Check labs due to diuretic use but no anticipated med changes.

## 2023-11-16 NOTE — Assessment & Plan Note (Signed)
Chronic problem.  Currently on Lipitor 80mg daily w/o difficulty.  Check labs.  Adjust meds prn  

## 2023-11-16 NOTE — Progress Notes (Signed)
   Subjective:    Patient ID: [name], male    DOB: 1935/04/28, 89 y.o.   MRN: [id number]  HPI HTN- chronic problem, on Amlodipine 5mg  daily, Carvedilol 6.25mg  BID, hydrochlorothiazide 25mg  daily w/ good control.  No CP, SOB, HA's, visual changes, edema.  Hyperlipidemia- chronic problem.  Currently on Lipitor 80mg  daily.  No abd pain, N/V.  No regular exercise.   Review of Systems For ROS see HPI     Objective:   Physical Exam Vitals reviewed.  Constitutional:      General: He is not in acute distress.    Appearance: Normal appearance. He is well-developed. He is not ill-appearing.  HENT:     Head: Normocephalic and atraumatic.  Eyes:     Extraocular Movements: Extraocular movements intact.     Conjunctiva/sclera: Conjunctivae normal.     Pupils: Pupils are equal, round, and reactive to light.  Neck:     Thyroid: No thyromegaly.  Cardiovascular:     Rate and Rhythm: Normal rate and regular rhythm.     Heart sounds: No murmur heard.    Comments: Muffled heart sounds Pulmonary:     Effort: Pulmonary effort is normal. No respiratory distress.     Breath sounds: Normal breath sounds.  Abdominal:     General: Bowel sounds are normal. There is no distension.     Palpations: Abdomen is soft.  Musculoskeletal:     Cervical back: Normal range of motion and neck supple.     Right lower leg: No edema.     Left lower leg: No edema.  Lymphadenopathy:     Cervical: No cervical adenopathy.  Skin:    General: Skin is warm and dry.  Neurological:     General: No focal deficit present.     Mental Status: He is alert and oriented to person, place, and time.     Cranial Nerves: No cranial nerve deficit.  Psychiatric:        Mood and Affect: Mood normal.        Behavior: Behavior normal.           Assessment & Plan:

## 2023-11-17 ENCOUNTER — Encounter: Payer: Self-pay | Admitting: Family Medicine

## 2023-11-17 LAB — CBC WITH DIFFERENTIAL/PLATELET
Basophils Absolute: 0.1 10*3/uL (ref 0.0–0.1)
Basophils Relative: 1 % (ref 0.0–3.0)
Eosinophils Absolute: 0.1 10*3/uL (ref 0.0–0.7)
Eosinophils Relative: 1.6 % (ref 0.0–5.0)
HCT: 40.1 % (ref 39.0–52.0)
Hemoglobin: 13.3 g/dL (ref 13.0–17.0)
Lymphocytes Relative: 23.8 % (ref 12.0–46.0)
Lymphs Abs: 2.2 10*3/uL (ref 0.7–4.0)
MCHC: 33.3 g/dL (ref 30.0–36.0)
MCV: 89.6 fL (ref 78.0–100.0)
Monocytes Absolute: 0.8 10*3/uL (ref 0.1–1.0)
Monocytes Relative: 8.4 % (ref 3.0–12.0)
Neutro Abs: 6 10*3/uL (ref 1.4–7.7)
Neutrophils Relative %: 65.2 % (ref 43.0–77.0)
Platelets: 103 10*3/uL — ABNORMAL LOW (ref 150.0–400.0)
RBC: 4.48 Mil/uL (ref 4.22–5.81)
RDW: 13.8 % (ref 11.5–15.5)
WBC: 9.2 10*3/uL (ref 4.0–10.5)

## 2023-11-17 LAB — BASIC METABOLIC PANEL
BUN: 29 mg/dL — ABNORMAL HIGH (ref 6–23)
CO2: 29 meq/L (ref 19–32)
Calcium: 9.3 mg/dL (ref 8.4–10.5)
Chloride: 104 meq/L (ref 96–112)
Creatinine, Ser: 1.07 mg/dL (ref 0.40–1.50)
GFR: 62.86 mL/min (ref >60.00)
Glucose, Bld: 103 mg/dL — ABNORMAL HIGH (ref 70–99)
Potassium: 3.6 meq/L (ref 3.5–5.1)
Sodium: 143 meq/L (ref 135–145)

## 2023-11-17 LAB — HEPATIC FUNCTION PANEL
ALT: 21 U/L (ref 0–53)
AST: 25 U/L (ref 0–37)
Albumin: 4.1 g/dL (ref 3.5–5.2)
Alkaline Phosphatase: 70 U/L (ref 39–117)
Bilirubin, Direct: 0.1 mg/dL (ref 0.0–0.3)
Total Bilirubin: 0.6 mg/dL (ref 0.2–1.2)
Total Protein: 6.9 g/dL (ref 6.0–8.3)

## 2023-11-17 LAB — LIPID PANEL
Cholesterol: 117 mg/dL (ref 0–200)
HDL: 38.4 mg/dL — ABNORMAL LOW (ref >39.00)
LDL Cholesterol: 46 mg/dL (ref 0–99)
NonHDL: 78.11
Total CHOL/HDL Ratio: 3
Triglycerides: 160 mg/dL — ABNORMAL HIGH (ref 0.0–149.0)
VLDL: 32 mg/dL (ref 0.0–40.0)

## 2023-11-17 LAB — TSH: TSH: 2.79 u[IU]/mL (ref 0.35–5.50)

## 2023-11-18 ENCOUNTER — Telehealth: Payer: Self-pay

## 2023-11-18 NOTE — Telephone Encounter (Signed)
-----   Message from Neena Rhymes sent at 11/17/2023  6:54 PM EST ----- Labs look great!  No changes at this time

## 2023-11-18 NOTE — Telephone Encounter (Signed)
Pt has reviewed labs via MyChart

## 2023-12-18 ENCOUNTER — Other Ambulatory Visit: Payer: Self-pay | Admitting: Pulmonary Disease

## 2023-12-25 ENCOUNTER — Other Ambulatory Visit: Payer: Self-pay | Admitting: Cardiology

## 2023-12-30 ENCOUNTER — Other Ambulatory Visit: Payer: Self-pay | Admitting: Cardiology

## 2024-01-03 ENCOUNTER — Other Ambulatory Visit: Payer: Self-pay | Admitting: Family Medicine

## 2024-01-03 DIAGNOSIS — G3184 Mild cognitive impairment, so stated: Secondary | ICD-10-CM

## 2024-01-27 ENCOUNTER — Ambulatory Visit (HOSPITAL_COMMUNITY)
Admission: RE | Admit: 2024-01-27 | Discharge: 2024-01-27 | Disposition: A | Discharge status: Home / Self Care | Source: Ambulatory Visit | Attending: Cardiology | Admitting: Cardiology

## 2024-01-27 DIAGNOSIS — I6523 Occlusion and stenosis of bilateral carotid arteries: Secondary | ICD-10-CM

## 2024-01-28 ENCOUNTER — Encounter: Payer: Self-pay | Admitting: Cardiology

## 2024-01-28 ENCOUNTER — Telehealth: Payer: Self-pay

## 2024-01-28 DIAGNOSIS — I6523 Occlusion and stenosis of bilateral carotid arteries: Secondary | ICD-10-CM

## 2024-01-28 NOTE — Telephone Encounter (Signed)
 Patient notified of result. Repeat dopplers ordered for next year. Pt verbalized understanding. All questions, if any, were answered.

## 2024-01-28 NOTE — Telephone Encounter (Signed)
-----   Message from Armanda Magic sent at 01/28/2024  9:30 AM EDT ----- Carotid dopplers showed 1-39% right and 40-59% left carotid artery stenosis. Repeat dopplers in 1 year

## 2024-02-05 ENCOUNTER — Other Ambulatory Visit: Payer: Self-pay | Admitting: Primary Care

## 2024-02-05 NOTE — Telephone Encounter (Signed)
 Copied from CRM [id number]. Topic: Clinical - Medication Refill >> Feb 05, 2024 11:58 AM Shelbie Proctor wrote: Most Recent Pulmonary Care Visit:   Department: [facility] Provider: NP, Ames Dura  Date: 10/16/2023   Medication: fluticasone furoate-vilanterol (BREO ELLIPTA) 100-25 MCG/ACT AEPB    Has the patient contacted their pharmacy? Yes (Agent: If no, request that the patient contact the pharmacy for the refill. If patient does not wish to contact the pharmacy document the reason why and proceed with request.) (Agent: If yes, when and what did the pharmacy advise?)  Is this the correct pharmacy for this prescription? Yes If no, delete pharmacy and type the correct one.  This is the patient's preferred pharmacy:  [id number] [place], [place] - [address] AT [facility] [address] & [address] [place] [address] Phone: [phone] Fax: [phone]  Has the prescription been filled recently? No  Is the patient out of the medication? Yes  Has the patient been seen for an appointment in the last year OR does the patient have an upcoming appointment? Yes  Can we respond through MyChart? No, please call [phone]  Agent: Please be advised that Rx refills may take up to 3 business days. We ask that you follow-up with your pharmacy.

## 2024-02-08 ENCOUNTER — Other Ambulatory Visit: Payer: Self-pay | Admitting: Family Medicine

## 2024-02-08 MED ORDER — FLUTICASONE FUROATE-VILANTEROL 100-25 MCG/ACT IN AEPB: 1.0000 | INHALATION_SPRAY | Freq: Every day | RESPIRATORY_TRACT | 3 refills | Status: DC

## 2024-02-08 NOTE — Telephone Encounter (Signed)
 Copied from CRM [id number]. Topic: Clinical - Medication Refill >> Feb 08, 2024 10:29 AM Juliana Ocean wrote: Most Recent Primary Care Visit:  Provider: TABORI, KATHERINE E  Department: [facility]  Visit Type: OFFICE VISIT  Date: 11/16/2023  Medication:fluticasone furoate-vilanterol (BREO ELLIPTA) 100-25 MCG/INH  Has the patient contacted their pharmacy? Yes (Agent: If no, request that the patient contact the pharmacy for the refill. If patient does not wish to contact the pharmacy document the reason why and proceed with request.) (Agent: If yes, when and what did the pharmacy advise?)  Is this the correct pharmacy for this prescription? Yes If no, delete pharmacy and type the correct one.    [id number] - [place], [place] - [address] AT [facility] [address] [place] [address] Phone: [phone] Fax: [phone]   Has the prescription been filled recently? No  Is the patient out of the medication? Yes  Has the patient been seen for an appointment in the last year OR does the patient have an upcoming appointment? Yes  Can we respond through MyChart? No  Agent: Please be advised that Rx refills may take up to 3 business days. We ask that you follow-up with your pharmacy.

## 2024-02-09 MED ORDER — FLUTICASONE FUROATE-VILANTEROL 100-25 MCG/ACT IN AEPB: 1.0000 | INHALATION_SPRAY | Freq: Every day | RESPIRATORY_TRACT | 3 refills | Status: AC

## 2024-03-02 ENCOUNTER — Other Ambulatory Visit: Payer: Self-pay | Admitting: Primary Care

## 2024-03-02 MED ORDER — MONTELUKAST SODIUM 10 MG PO TABS: 10.0000 mg | ORAL_TABLET | Freq: Every day | ORAL | 3 refills | Status: DC

## 2024-03-02 NOTE — Telephone Encounter (Signed)
 Copied from CRM [id number]. Topic: Clinical - Medication Refill >> Mar 02, 2024  2:22 PM Trevor Fudge R wrote: Medication: montelukast  (SINGULAIR ) 10 MG tablet  Has the patient contacted their pharmacy? Pharmacy called [id number] (Agent: If no, request that the patient contact the pharmacy for the refill. If patient does not wish to contact the pharmacy document the reason why and proceed with request.) (Agent: If yes, when and what did the pharmacy advise?)  This is the patient's preferred pharmacy:  [facility] - [place], [place] - [address] AT [address] [place] [address] Phone: [phone] Fax: [phone]   Is this the correct pharmacy for this prescription? Yes If no, delete pharmacy and type the correct one.   Has the prescription been filled recently? Yes  Is the patient out of the medication? Unknown  Has the patient been seen for an appointment in the last year OR does the patient have an upcoming appointment? Yes  Can we respond through MyChart? No  Agent: Please be advised that Rx refills may take up to 3 business days. We ask that you follow-up with your pharmacy.

## 2024-03-02 NOTE — Telephone Encounter (Signed)
 Last Fill: 11/24/22  Last OV: 11/16/23 Next OV: 05/16/24  Routing to provider for review/authorization.

## 2024-03-02 NOTE — Addendum Note (Signed)
 Addended by: Jinx Mourning on: 03/02/2024 03:11 PM   Modules accepted: Orders

## 2024-03-15 NOTE — Progress Notes (Signed)
 [facility] [address] [place] [address] Phone: [phone] Subjective:   [name], am serving as a scribe for Dr. Ronnell Coins.  I'm seeing this patient by the request  of:  Jess Morita, MD  CC: Right hamstring and piriformis pain usually  LKG:[id number]  11/09/2023 Injected the hamstring over nearly 2 years ago at this point.  Patient is doing well and can consider the possibility of injections if needed.  Has the ossification noted of the tendons.  Follow-up with me again in 6 to 8 weeks of the next.     Patient has done remarkably well at this time.  Did respond well to this area in the piriformis as well as gluteal tendon injections previously.  At the moment I do not think we need to change management.  Follow-up with me as needed.      Update 03/16/2024 [name] is a 89 y.o. male coming in with complaint of R hamstring and piriformis pain. Patient states this pain is new. Started hurting 5 days ago. L gluteal pain when putting pressure on leg. No home therapies.      Past Medical History:  Diagnosis Date   Allergy    Arthritis    "scattered" (10/26/2018)   Asthma    C. difficile diarrhea    CAD (coronary artery disease) 2005   s/p PCI of RCA   Carotid stenosis    Carotid dopplers showed 1-39% right and 40-59% left carotid artery stenosis 01/2024   Cataract    NS OD   Cognitive impairment, mild, so stated    Colitis    Erectile dysfunction    GERD (gastroesophageal reflux disease)    Hyperlipidemia    Hypertension    Perforated stomach, acute 10/23/2018   Peripheral vascular disease (HCC)    Retinal detachment    Vertebrobasilar artery syndrome    Vertigo    Past Surgical History:  Procedure Laterality Date   APPENDECTOMY  1954   CATARACT EXTRACTION Left 08/08/2019   Dr. Danley Dusky   COLONOSCOPY     ESOPHAGOGASTRODUODENOSCOPY (EGD) WITH ESOPHAGEAL DILATION  09/2018   EYE SURGERY     LAPAROTOMY N/A  10/23/2018   Procedure: EXPLORATORY LAPAROTOMY WITH GRAHAM PATCH FOR PERFORATED ULCER AND PLACEMENT OF GASTRIC TUBE;  Surgeon: Enid Harry, MD;  Location: [facility] OR;  Service: General;  Laterality: N/A;   NASAL SINUS SURGERY  05/2018   RETINAL DETACHMENT SURGERY     "? side"   UPPER GASTROINTESTINAL ENDOSCOPY     Social History   Socioeconomic History   Marital status: Married    Spouse name: Not on file   Number of children: 3   Years of education: Not on file   Highest education level: Master's degree (e.g., MA, MS, MEng, MEd, MSW, MBA)  Occupational History   Occupation: Retired Teaching laboratory technician: RETIRED  Tobacco Use   Smoking status: Former    Types: Cigars    Quit date: 05/13/1961    Years since quitting: 62.8    Passive exposure: Never   Smokeless tobacco: Never   Tobacco comments:    "never inhaled" in the early 60's   Vaping Use   Vaping status: Never Used  Substance and Sexual Activity   Alcohol use: Yes    Alcohol/week: 14.0 standard drinks of alcohol    Types: 14 Glasses of wine per week   Drug use: Never   Sexual activity: Not Currently  Other Topics Concern   Not on file  Social History Narrative   Married with 1 son and 2 daughters.  Retired.   2 caffeinated beverages daily, 3 glasses of wine daily   Never smoker no drug use no tobacco   Social Drivers of Corporate investment banker Strain: Low Risk  (11/12/2023)   Overall Financial Resource Strain (CARDIA)    Difficulty of Paying Living Expenses: Not hard at all  Food Insecurity: No Food Insecurity (11/12/2023)   Hunger Vital Sign    Worried About Running Out of Food in the Last Year: Never true    Ran Out of Food in the Last Year: Never true  Transportation Needs: No Transportation Needs (11/12/2023)   PRAPARE - Administrator, Civil Service (Medical): No    Lack of Transportation (Non-Medical): No  Physical Activity: Insufficiently Active (11/12/2023)   Exercise Vital  Sign    Days of Exercise per Week: 2 days    Minutes of Exercise per Session: 10 min  Stress: No Stress Concern Present (11/12/2023)   [employer] of Occupational Health - Occupational Stress Questionnaire    Feeling of Stress : Not at all  Social Connections: Moderately Integrated (11/12/2023)   Social Connection and Isolation Panel [NHANES]    Frequency of Communication with Friends and Family: More than three times a week    Frequency of Social Gatherings with Friends and Family: Twice a week    Attends Religious Services: Never    Database administrator or Organizations: No    Attends Engineer, structural: More than 4 times per year    Marital Status: Married   Allergies  Allergen Reactions   Neosporin [Bacitracin-Polymyxin B] Other (See Comments)    Redness   Family History  Problem Relation Age of Onset   Stroke Mother    Hyperlipidemia Mother    Hypertension Mother    Deep vein thrombosis Mother    Stroke Father    Hyperlipidemia Father    Hypertension Father    Deep vein thrombosis Father    Diverticulitis Father    Ulcerative colitis Father    Colon polyps Father    Colon cancer Neg Hx    Esophageal cancer Neg Hx    Stomach cancer Neg Hx    Rectal cancer Neg Hx      Current Outpatient Medications (Cardiovascular):    amLODipine  (NORVASC ) 5 MG tablet, Take 1 tablet (5 mg total) by mouth daily.   atorvastatin  (LIPITOR ) 80 MG tablet, TAKE 1 TABLET(80 MG) BY MOUTH DAILY   carvedilol  (COREG ) 6.25 MG tablet, Take 1 tablet (6.25 mg total) by mouth 2 (two) times daily with a meal. Please keep upcoming appt for future refills Thanks   hydrochlorothiazide  (HYDRODIURIL ) 25 MG tablet, TAKE 1 TABLET(25 MG) BY MOUTH DAILY  Current Outpatient Medications (Respiratory):    albuterol  (PROVENTIL ) (2.5 MG/3ML) 0.083% nebulizer solution, Take 3 mLs (2.5 mg total) by nebulization every 6 (six) hours as needed for wheezing or shortness of breath.   albuterol  (VENTOLIN   HFA) 108 (90 Base) MCG/ACT inhaler, Inhale 1-2 puffs into the lungs every 4 (four) hours as needed for wheezing or shortness of breath. Patient need to keep office visit to get more refill's .   azelastine  (ASTELIN ) 0.1 % nasal spray, USE 1 SPRAY IN EACH NOSTRIL TWICE DAILY   fluticasone  (FLONASE ) 50 MCG/ACT nasal spray, 1 spray each nostril twice daily   fluticasone  furoate-vilanterol (BREO ELLIPTA ) 100-25 MCG/ACT AEPB,  Inhale 1 puff into the lungs daily.   fluticasone  furoate-vilanterol (BREO ELLIPTA ) 100-25 MCG/ACT AEPB, Inhale 1 puff into the lungs daily.   montelukast  (SINGULAIR ) 10 MG tablet, Take 1 tablet (10 mg total) by mouth at bedtime.  Current Outpatient Medications (Analgesics):    acetaminophen  (TYLENOL ) 500 MG tablet, Take 500 mg by mouth 2 (two) times daily.   aspirin  EC 81 MG tablet, Take 81 mg by mouth daily.   Current Outpatient Medications (Other):    cholecalciferol (VITAMIN D3) 25 MCG (1000 UT) tablet, Take 1,000 Units by mouth daily.   donepezil  (ARICEPT ) 10 MG tablet, TAKE 1 TABLET BY MOUTH DAILY   doxycycline  (ADOXA) 100 MG tablet, Take 100 mg by mouth 2 (two) times daily.   Eyelid Cleansers (OCUSOFT EYELID CLEANSING) PADS, Place 1 application into both eyes 2 (two) times daily.   fish oil-omega-3 fatty acids  1000 MG capsule, Take 2 capsules (2 g total) by mouth 2 (two) times daily.   gabapentin  (NEURONTIN ) 300 MG capsule, TAKE 1 CAPSULE(300 MG) BY MOUTH THREE TIMES DAILY   Polyvinyl Alcohol-Povidone (REFRESH OP), Place 1 drop into both eyes 2 (two) times daily.   Saw Palmetto 500 MG CAPS, Take 1,000 mg by mouth 2 (two) times daily.    Reviewed prior external information including notes and imaging from  primary care provider As well as notes that were available from care everywhere and other healthcare systems.  Past medical history, social, surgical and family history all reviewed in electronic medical record.  No pertanent information unless stated regarding to  the chief complaint.   Review of Systems:  No headache, visual changes, nausea, vomiting, diarrhea, constipation, dizziness, abdominal pain, skin rash, fevers, chills, night sweats, weight loss, swollen lymph nodes, body aches, joint swelling, chest pain, shortness of breath, mood changes. POSITIVE muscle aches  Objective  Blood pressure 132/64, pulse 64, height 5\' 5"  (1.651 m), weight 144 lb (65.3 kg), SpO2 97%.   General: No apparent distress alert and oriented x3 mood and affect normal, dressed appropriately.  HEENT: Pupils equal, extraocular movements intact  Respiratory: Patient's speak in full sentences and does not appear short of breath  Cardiovascular: No lower extremity edema, non tender, no erythema  Patient has severe loss of lordosis of the back.  Still has some limited range of motion.  Severely tender to palpation at the insertion of the gluteal medius tendon on the greater trochanteric area.  Positive FABER test noted.  Negative straight leg test noted.  No pain over the hamstring at the moment.   After verbal consent patient was prepped with alcohol pad and with a 21-gauge 1-1/2 inch needle injected with 0.5 cc of 0.5% Marcaine and 1 cc of Kenalog  40 mg/mL into the gluteal medius tendon near the greater trochanteric area.  No blood loss, Band-Aid placed.  Postinjection instruction given.  [id number]; 15 additional minutes spent for Therapeutic exercises as stated in above notes.  This included exercises focusing on stretching, strengthening, with significant focus on eccentric aspects.   Long term goals include an improvement in range of motion, strength, endurance as well as avoiding reinjury. Patient's frequency would include in 1-2 times a day, 3-5 times a week for a duration of 6-12 weeks. Hip strengthening exercises which included:  Pelvic tilt/bracing to help with proper recruitment of the lower abs and pelvic floor muscles  Glute strengthening to properly contract glutes without  over-engaging low back and hamstrings - prone hip extension and glute bridge exercises Proper stretching techniques  to increase effectiveness for the hip flexors, groin, quads, piriformic and low back when appropriate   Proper technique shown and discussed handout in great detail with ATC.  All questions were discussed and answered.     Impression and Recommendations:    The above documentation has been reviewed and is accurate and complete Sherlene Rickel M Vincenzina Jagoda, DO

## 2024-03-16 ENCOUNTER — Ambulatory Visit (INDEPENDENT_AMBULATORY_CARE_PROVIDER_SITE_OTHER)

## 2024-03-16 ENCOUNTER — Encounter: Payer: Self-pay | Admitting: Family Medicine

## 2024-03-16 ENCOUNTER — Ambulatory Visit: Admitting: Family Medicine

## 2024-03-16 VITALS — BP 132/64 | HR 64 | Ht 65.0 in | Wt 144.0 lb

## 2024-03-16 DIAGNOSIS — M7602 Gluteal tendinitis, left hip: Secondary | ICD-10-CM

## 2024-03-16 DIAGNOSIS — M48062 Spinal stenosis, lumbar region with neurogenic claudication: Secondary | ICD-10-CM

## 2024-03-16 DIAGNOSIS — M25552 Pain in left hip: Secondary | ICD-10-CM

## 2024-03-16 NOTE — Assessment & Plan Note (Signed)
 Known spinal stenosis and will need to monitor.  Could be getting more atrophy and weakness that could be contributing to this discomfort.

## 2024-03-16 NOTE — Patient Instructions (Signed)
 Good to see you as always! Seems to be more of a gluteal tendon injury Exercises given today do them 3 times a week Ice every 4 hours when awake for 20 minutes Injection given today See me again in 2 to 3 months

## 2024-03-16 NOTE — Assessment & Plan Note (Signed)
 Injection given today and tolerated the procedure well.  Discussed icing regimen and home exercises, discussed which activities to do and which ones to avoid.  Follow-up again in 6 to 8 weeks

## 2024-03-24 ENCOUNTER — Other Ambulatory Visit: Payer: Self-pay | Admitting: Cardiology

## 2024-03-24 ENCOUNTER — Ambulatory Visit: Payer: Self-pay | Admitting: Family Medicine

## 2024-04-05 ENCOUNTER — Ambulatory Visit: Admitting: *Deleted

## 2024-04-05 DIAGNOSIS — Z0000 Encounter for general adult medical examination without abnormal findings: Secondary | ICD-10-CM

## 2024-04-05 NOTE — Patient Instructions (Signed)
 Mr. Burstein , Thank you for taking time to come for your Medicare Wellness Visit. I appreciate your ongoing commitment to your health goals. Please review the following plan we discussed and let me know if I can assist you in the future.   Screening recommendations/referrals: Colonoscopy: no longer required Recommended yearly ophthalmology/optometry visit for glaucoma screening and checkup Recommended yearly dental visit for hygiene and checkup  Vaccinations: Influenza vaccine: up to date Pneumococcal vaccine: up to date Tdap vaccine: up to date Shingles vaccine: up to date       Preventive Care 65 Years and Older, Male Preventive care refers to lifestyle choices and visits with your health care provider that can promote health and wellness. What does preventive care include? A yearly physical exam. This is also called an annual well check. Dental exams once or twice a year. Routine eye exams. Ask your health care provider how often you should have your eyes checked. Personal lifestyle choices, including: Daily care of your teeth and gums. Regular physical activity. Eating a healthy diet. Avoiding tobacco and drug use. Limiting alcohol use. Practicing safe sex. Taking low doses of aspirin  every day. Taking vitamin and mineral supplements as recommended by your health care provider. What happens during an annual well check? The services and screenings done by your health care provider during your annual well check will depend on your age, overall health, lifestyle risk factors, and family history of disease. Counseling  Your health care provider may ask you questions about your: Alcohol use. Tobacco use. Drug use. Emotional well-being. Home and relationship well-being. Sexual activity. Eating habits. History of falls. Memory and ability to understand (cognition). Work and work Astronomer. Screening  You may have the following tests or measurements: Height, weight, and  BMI. Blood pressure. Lipid and cholesterol levels. These may be checked every 5 years, or more frequently if you are over 59 years old. Skin check. Lung cancer screening. You may have this screening every year starting at age 97 if you have a 30-pack-year history of smoking and currently smoke or have quit within the past 15 years. Fecal occult blood test (FOBT) of the stool. You may have this test every year starting at age 24. Flexible sigmoidoscopy or colonoscopy. You may have a sigmoidoscopy every 5 years or a colonoscopy every 10 years starting at age 56. Prostate cancer screening. Recommendations will vary depending on your family history and other risks. Hepatitis C blood test. Hepatitis B blood test. Sexually transmitted disease (STD) testing. Diabetes screening. This is done by checking your blood sugar (glucose) after you have not eaten for a while (fasting). You may have this done every 1-3 years. Abdominal aortic aneurysm (AAA) screening. You may need this if you are a current or former smoker. Osteoporosis. You may be screened starting at age 56 if you are at high risk. Talk with your health care provider about your test results, treatment options, and if necessary, the need for more tests. Vaccines  Your health care provider may recommend certain vaccines, such as: Influenza vaccine. This is recommended every year. Tetanus, diphtheria, and acellular pertussis (Tdap, Td) vaccine. You may need a Td booster every 10 years. Zoster vaccine. You may need this after age 35. Pneumococcal 13-valent conjugate (PCV13) vaccine. One dose is recommended after age 57. Pneumococcal polysaccharide (PPSV23) vaccine. One dose is recommended after age 69. Talk to your health care provider about which screenings and vaccines you need and how often you need them. This information is not  intended to replace advice given to you by your health care provider. Make sure you discuss any questions you have  with your health care provider. Document Released: 11/09/2015 Document Revised: 07/02/2016 Document Reviewed: 08/14/2015 Elsevier Interactive Patient Education  2017 [employer].  Fall Prevention in the Home Falls can cause injuries. They can happen to people of all ages. There are many things you can do to make your home safe and to help prevent falls. What can I do on the outside of my home? Regularly fix the edges of walkways and driveways and fix any cracks. Remove anything that might make you trip as you walk through a door, such as a raised step or threshold. Trim any bushes or trees on the path to your home. Use bright outdoor lighting. Clear any walking paths of anything that might make someone trip, such as rocks or tools. Regularly check to see if handrails are loose or broken. Make sure that both sides of any steps have handrails. Any raised decks and porches should have guardrails on the edges. Have any leaves, snow, or ice cleared regularly. Use sand or salt on walking paths during winter. Clean up any spills in your garage right away. This includes oil or grease spills. What can I do in the bathroom? Use night lights. Install grab bars by the toilet and in the tub and shower. Do not use towel bars as grab bars. Use non-skid mats or decals in the tub or shower. If you need to sit down in the shower, use a plastic, non-slip stool. Keep the floor dry. Clean up any water that spills on the floor as soon as it happens. Remove soap buildup in the tub or shower regularly. Attach bath mats securely with double-sided non-slip rug tape. Do not have throw rugs and other things on the floor that can make you trip. What can I do in the bedroom? Use night lights. Make sure that you have a light by your bed that is easy to reach. Do not use any sheets or blankets that are too big for your bed. They should not hang down onto the floor. Have a firm chair that has side arms. You can use  this for support while you get dressed. Do not have throw rugs and other things on the floor that can make you trip. What can I do in the kitchen? Clean up any spills right away. Avoid walking on wet floors. Keep items that you use a lot in easy-to-reach places. If you need to reach something above you, use a strong step stool that has a grab bar. Keep electrical cords out of the way. Do not use floor polish or wax that makes floors slippery. If you must use wax, use non-skid floor wax. Do not have throw rugs and other things on the floor that can make you trip. What can I do with my stairs? Do not leave any items on the stairs. Make sure that there are handrails on both sides of the stairs and use them. Fix handrails that are broken or loose. Make sure that handrails are as long as the stairways. Check any carpeting to make sure that it is firmly attached to the stairs. Fix any carpet that is loose or worn. Avoid having throw rugs at the top or bottom of the stairs. If you do have throw rugs, attach them to the floor with carpet tape. Make sure that you have a light switch at the top of the stairs  and the bottom of the stairs. If you do not have them, ask someone to add them for you. What else can I do to help prevent falls? Wear shoes that: Do not have high heels. Have rubber bottoms. Are comfortable and fit you well. Are closed at the toe. Do not wear sandals. If you use a stepladder: Make sure that it is fully opened. Do not climb a closed stepladder. Make sure that both sides of the stepladder are locked into place. Ask someone to hold it for you, if possible. Clearly mark and make sure that you can see: Any grab bars or handrails. First and last steps. Where the edge of each step is. Use tools that help you move around (mobility aids) if they are needed. These include: Canes. Walkers. Scooters. Crutches. Turn on the lights when you go into a dark area. Replace any light bulbs  as soon as they burn out. Set up your furniture so you have a clear path. Avoid moving your furniture around. If any of your floors are uneven, fix them. If there are any pets around you, be aware of where they are. Review your medicines with your doctor. Some medicines can make you feel dizzy. This can increase your chance of falling. Ask your doctor what other things that you can do to help prevent falls. This information is not intended to replace advice given to you by your health care provider. Make sure you discuss any questions you have with your health care provider. Document Released: 08/09/2009 Document Revised: 03/20/2016 Document Reviewed: 11/17/2014 Elsevier Interactive Patient Education  2017 [employer].

## 2024-04-05 NOTE — Progress Notes (Signed)
 Subjective:   [name] is a 89 y.o. male who presents for Medicare Annual/Subsequent preventive examination.  Visit Complete: Virtual I connected with  [name] on 04/05/24 by a audio enabled telemedicine application and verified that I am speaking with the correct person using two identifiers.  Patient Location: Home  Provider Location: Home Office  I discussed the limitations of evaluation and management by telemedicine. The patient expressed understanding and agreed to proceed.  Vital Signs: Because this visit was a virtual/telehealth visit, some criteria may be missing or patient reported. Any vitals not documented were not able to be obtained and vitals that have been documented are patient reported.  Patient Medicare AWV questionnaire was completed by the patient on 04-01-2024; I have confirmed that all information answered by patient is correct and no changes since this date.        Objective:     There were no vitals filed for this visit. There is no height or weight on file to calculate BMI.     04/09/2023    4:07 PM 05/08/2022    1:21 PM 03/11/2021    8:21 AM 03/07/2020   12:11 PM 12/21/2018    1:59 PM 10/26/2018    1:52 PM 10/23/2018   12:43 PM  Advanced Directives  Does Patient Have a Medical Advance Directive? Yes Yes Yes Yes Yes Yes Yes  Type of Advance Directive Living will Healthcare Power of [name];Living will Living will Living will;Healthcare Power of [name] Power of [name];Living will Living will Healthcare Power of [place];Living will  Does patient want to make changes to medical advance directive?    No - Patient declined  No - Patient declined   Copy of Healthcare Power of Attorney in Chart?  No - copy requested  No - copy requested       Current Medications (verified) Outpatient Encounter Medications as of 04/05/2024  Medication Sig   acetaminophen  (TYLENOL ) 500 MG tablet Take 500 mg by mouth 2 (two) times daily.   albuterol   (PROVENTIL ) (2.5 MG/3ML) 0.083% nebulizer solution Take 3 mLs (2.5 mg total) by nebulization every 6 (six) hours as needed for wheezing or shortness of breath.   albuterol  (VENTOLIN  HFA) 108 (90 Base) MCG/ACT inhaler Inhale 1-2 puffs into the lungs every 4 (four) hours as needed for wheezing or shortness of breath. Patient need to keep office visit to get more refill's .   amLODipine  (NORVASC ) 5 MG tablet Take 1 tablet (5 mg total) by mouth daily.   aspirin  EC 81 MG tablet Take 81 mg by mouth daily.   atorvastatin  (LIPITOR ) 80 MG tablet TAKE 1 TABLET(80 MG) BY MOUTH DAILY   azelastine  (ASTELIN ) 0.1 % nasal spray USE 1 SPRAY IN EACH NOSTRIL TWICE DAILY   carvedilol  (COREG ) 6.25 MG tablet Take 1 tablet (6.25 mg total) by mouth 2 (two) times daily with a meal. Please keep upcoming appt for future refills Thanks   cholecalciferol (VITAMIN D3) 25 MCG (1000 UT) tablet Take 1,000 Units by mouth daily.   donepezil  (ARICEPT ) 10 MG tablet TAKE 1 TABLET BY MOUTH DAILY   doxycycline  (ADOXA) 100 MG tablet Take 100 mg by mouth 2 (two) times daily.   Eyelid Cleansers (OCUSOFT EYELID CLEANSING) PADS Place 1 application into both eyes 2 (two) times daily.   fish oil-omega-3 fatty acids  1000 MG capsule Take 2 capsules (2 g total) by mouth 2 (two) times daily.   fluticasone  (FLONASE ) 50 MCG/ACT nasal spray 1 spray each nostril twice daily  fluticasone  furoate-vilanterol (BREO ELLIPTA ) 100-25 MCG/ACT AEPB Inhale 1 puff into the lungs daily.   fluticasone  furoate-vilanterol (BREO ELLIPTA ) 100-25 MCG/ACT AEPB Inhale 1 puff into the lungs daily.   gabapentin  (NEURONTIN ) 300 MG capsule TAKE 1 CAPSULE(300 MG) BY MOUTH THREE TIMES DAILY   hydrochlorothiazide  (HYDRODIURIL ) 25 MG tablet TAKE 1 TABLET(25 MG) BY MOUTH DAILY   montelukast  (SINGULAIR ) 10 MG tablet Take 1 tablet (10 mg total) by mouth at bedtime.   Polyvinyl Alcohol-Povidone (REFRESH OP) Place 1 drop into both eyes 2 (two) times daily.   Saw Palmetto 500 MG CAPS  Take 1,000 mg by mouth 2 (two) times daily.    No facility-administered encounter medications on file as of 04/05/2024.    Allergies (verified) Neosporin [bacitracin-polymyxin b]   History: Past Medical History:  Diagnosis Date   Allergy    Arthritis    scattered (10/26/2018)   Asthma    C. difficile diarrhea    CAD (coronary artery disease) 2005   s/p PCI of RCA   Carotid stenosis    Carotid dopplers showed 1-39% right and 40-59% left carotid artery stenosis 01/2024   Cataract    NS OD   Cognitive impairment, mild, so stated    Colitis    Erectile dysfunction    GERD (gastroesophageal reflux disease)    Hyperlipidemia    Hypertension    Perforated stomach, acute 10/23/2018   Peripheral vascular disease (HCC)    Retinal detachment    Vertebrobasilar artery syndrome    Vertigo    Past Surgical History:  Procedure Laterality Date   APPENDECTOMY  1954   CATARACT EXTRACTION Left 08/08/2019   Dr. Danley [name]   COLONOSCOPY     ESOPHAGOGASTRODUODENOSCOPY (EGD) WITH ESOPHAGEAL DILATION  09/2018   EYE SURGERY     LAPAROTOMY N/A 10/23/2018   Procedure: EXPLORATORY LAPAROTOMY WITH GRAHAM PATCH FOR PERFORATED ULCER AND PLACEMENT OF GASTRIC TUBE;  Surgeon: Robert Harry, MD;  Location: [facility] OR;  Service: General;  Laterality: N/A;   NASAL SINUS SURGERY  05/2018   RETINAL DETACHMENT SURGERY     ? side   UPPER GASTROINTESTINAL ENDOSCOPY     Family History  Problem Relation Age of Onset   Stroke Mother    Hyperlipidemia Mother    Hypertension Mother    Deep vein thrombosis Mother    Stroke Father    Hyperlipidemia Father    Hypertension Father    Deep vein thrombosis Father    Diverticulitis Father    Ulcerative colitis Father    Colon polyps Father    Colon cancer Neg Hx    Esophageal cancer Neg Hx    Stomach cancer Neg Hx    Rectal cancer Neg Hx    Social History   Socioeconomic History   Marital status: Married    Spouse name: Not on file   Number of  children: 3   Years of education: Not on file   Highest education level: Master's degree (e.g., MA, MS, MEng, MEd, MSW, MBA)  Occupational History   Occupation: Retired Teaching laboratory technician: RETIRED  Tobacco Use   Smoking status: Former    Types: Cigars    Quit date: 05/13/1961    Years since quitting: 62.9    Passive exposure: Never   Smokeless tobacco: Never   Tobacco comments:    never inhaled in the early 60's   Vaping Use   Vaping status: Never Used  Substance and Sexual Activity   Alcohol use: Yes  Alcohol/week: 14.0 standard drinks of alcohol    Types: 14 Glasses of wine per week   Drug use: Never   Sexual activity: Not Currently  Other Topics Concern   Not on file  Social History Narrative   Married with 1 son and 2 daughters.  Retired.   2 caffeinated beverages daily, 3 glasses of wine daily   Never smoker no drug use no tobacco   Social Drivers of Corporate investment banker Strain: Low Risk  (04/05/2024)   Overall Financial Resource Strain (CARDIA)    Difficulty of Paying Living Expenses: Not hard at all  Food Insecurity: No Food Insecurity (04/05/2024)   Hunger Vital Sign    Worried About Running Out of Food in the Last Year: Never true    Ran Out of Food in the Last Year: Never true  Transportation Needs: No Transportation Needs (04/05/2024)   PRAPARE - Administrator, Civil Service (Medical): No    Lack of Transportation (Non-Medical): No  Physical Activity: Insufficiently Active (04/05/2024)   Exercise Vital Sign    Days of Exercise per Week: 2 days    Minutes of Exercise per Session: 10 min  Stress: No Stress Concern Present (04/05/2024)   [employer] of Occupational Health - Occupational Stress Questionnaire    Feeling of Stress : Not at all  Social Connections: Moderately Integrated (04/05/2024)   Social Connection and Isolation Panel [NHANES]    Frequency of Communication with Friends and Family: More than three times a  week    Frequency of Social Gatherings with Friends and Family: Twice a week    Attends Religious Services: Never    Diplomatic Services operational officer: No    Attends Engineer, structural: More than 4 times per year    Marital Status: Married    Tobacco Counseling Counseling given: Not Answered Tobacco comments: never inhaled in the early 60's    Clinical Intake:  Pre-visit preparation completed: Yes  Pain : No/denies pain     Diabetes: No  How often do you need to have someone help you when you read instructions, pamphlets, or other written materials from your doctor or pharmacy?: 1 - Never  Interpreter Needed?: No  Information entered by :: Kieth Pelt LPN   Activities of Daily Living    04/01/2024    8:57 AM 05/14/2023    1:25 PM  In your present Robert of health, do you have any difficulty performing the following activities:  Hearing? 0 0  Vision? 0 0  Difficulty concentrating or making decisions? 0 0  Walking or climbing stairs? 0 0  Dressing or bathing? 0 0  Doing errands, shopping? 0 0  Preparing Food and eating ? N   Using the Toilet? N   In the past six months, have you accidently leaked urine? N   Do you have problems with loss of bowel control? N   Managing your Medications? N   Managing your Finances? N   Housekeeping or managing your Housekeeping? N     Patient Care Team: Jess Morita, MD as PCP - General Jacqueline Matsu, MD as PCP - Cardiology (Cardiology) Jacqueline Matsu, MD as Consulting Physician (Cardiology) Dannis Dy, MD (Inactive) as Consulting Physician (Vascular Surgery) Christina Coyer, MD as Consulting Physician (Urology) Kenney Peacemaker, MD as Consulting Physician (Gastroenterology) Prudy Brownie, DO (Inactive) as Consulting Physician (Pulmonary Disease) Autumn Lehmann, MD as Consulting Physician (Ophthalmology) Myrle Aspen,  RPH (Inactive) (Pharmacist)  Indicate any recent  Medical Services you may have received from other than [facility] providers in the past year (date may be approximate).     Assessment:    This is a routine wellness examination for Renaldo.  Hearing/Vision screen Hearing Screening - Comments:: No trouble hearing Vision Screening - Comments:: Up to date Coup   Goals Addressed             This Visit's Progress    Patient Stated   On track    Maintain current health     Patient Stated   On track    Continue current lifestyle     Patient Stated       Maintain current lifestyle       Depression Screen    09/15/2023    9:20 AM 05/14/2023    1:25 PM 04/09/2023    4:11 PM 11/17/2022   10:18 AM 05/12/2022    9:49 AM 05/08/2022    1:23 PM 05/08/2022    1:20 PM  PHQ 2/9 Scores  PHQ - 2 Score 0 0 0 0 0 0 0  PHQ- 9 Score 0 0 0  0      Fall Risk    04/05/2024    3:12 PM 04/01/2024    8:57 AM 09/15/2023    9:20 AM 05/14/2023    1:25 PM 04/09/2023    4:01 PM  Fall Risk   Falls in the past year? 0 0 1 0 1  Number falls in past yr: 0 0 0 0 1  Injury with Fall? 0  1 0 0  Risk for fall due to :   No Fall Risks No Fall Risks   Follow up Falls evaluation completed;Education provided;Falls prevention discussed  Falls evaluation completed Falls evaluation completed Falls evaluation completed;Education provided;Falls prevention discussed    MEDICARE RISK AT HOME: Medicare Risk at Home Any stairs in or around the home?: Yes If so, are there any without handrails?: No Home free of loose throw rugs in walkways, pet beds, electrical cords, etc?: Yes Adequate lighting in your home to reduce risk of falls?: Yes Life alert?: No Use of a cane, walker or w/c?: No Grab bars in the bathroom?: Yes Shower chair or bench in shower?: Yes Elevated toilet seat or a handicapped toilet?: Yes  TIMED UP AND GO:  Was the test performed?  No    Cognitive Function:        04/05/2024    3:14 PM 04/09/2023    4:07 PM 03/11/2021    8:28 AM 03/07/2020    12:12 PM  6CIT Screen  What Year? 0 points 0 points 0 points 0 points  What month? 0 points 0 points 0 points 0 points  What time? 0 points 0 points 0 points 0 points  Count back from 20 0 points 0 points 0 points 0 points  Months in reverse 0 points 0 points 0 points 0 points  Repeat phrase 0 points 0 points 0 points 2 points  Total Score 0 points 0 points 0 points 2 points    Immunizations Immunization History  Administered Date(s) Administered   Fluad Quad(high Dose 65+) 06/24/2019   Fluad Trivalent(High Dose 65+) 07/03/2023   Influenza Split 08/25/2011, 08/09/2012   Influenza Whole 07/11/2010   Influenza, High Dose Seasonal PF 08/14/2014, 07/16/2018   Influenza,inj,Quad PF,6+ Mos 08/11/2013, 08/18/2014, 07/11/2015, 07/14/2016, 07/20/2017   Influenza-Unspecified 07/24/2000, 07/27/2022   PFIZER(Purple Top)SARS-COV-2 Vaccination 11/15/2019, 12/06/2019, 07/27/2020, 03/08/2021  Pneumococcal Conjugate-13 01/25/2015   Pneumococcal Polysaccharide-23 09/15/2002   Td 12/13/2007, 05/31/2019   Zoster Recombinant(Shingrix) 05/29/2020, 08/14/2020   Zoster, Live 10/29/2010    TDAP status: Up to date  Flu Vaccine status: Up to date  Pneumococcal vaccine status: Up to date  Covid-19 vaccine status: Information provided on how to obtain vaccines.   Qualifies for Shingles Vaccine? No   Zostavax completed Yes   Shingrix Completed?: Yes  Screening Tests Health Maintenance  Topic Date Due   INFLUENZA VACCINE  05/27/2024   Medicare Annual Wellness (AWV)  04/05/2025   DTaP/Tdap/Td (3 - Tdap) 05/30/2029   Pneumonia Vaccine 62+ Years old  Completed   Zoster Vaccines- Shingrix  Completed   HPV VACCINES  Aged Out   Meningococcal B Vaccine  Aged Out   COVID-19 Vaccine  Discontinued    Health Maintenance  There are no preventive care reminders to display for this patient.   Colorectal cancer screening: No longer required.   Lung Cancer Screening: (Low Dose CT Chest recommended if  Age 54-80 years, 20 pack-year currently smoking OR have quit w/in 15years.) does not qualify.   Lung Cancer Screening Referral:   Additional Screening:  Hepatitis C Screening: does not qualified  Vision Screening: Recommended annual ophthalmology exams for early detection of glaucoma and other disorders of the eye. Is the patient up to date with their annual eye exam?  No  Who is the provider or what is the name of the office in which the patient attends annual eye exams? coup If pt is not established with a provider, would they like to be referred to a provider to establish care? No .   Dental Screening: Recommended annual dental exams for proper oral hygiene    Community Resource Referral / Chronic Care Management: CRR required this visit?  No   CCM required this visit?  No     Plan:     I have personally reviewed and noted the following in the patient's chart:   Medical and social history Use of alcohol, tobacco or illicit drugs  Current medications and supplements including opioid prescriptions. Patient is not currently taking opioid prescriptions. Functional ability and status Nutritional status Physical activity Advanced directives List of other physicians Hospitalizations, surgeries, and ER visits in previous 12 months Vitals Screenings to include cognitive, depression, and falls Referrals and appointments  In addition, I have reviewed and discussed with patient certain preventive protocols, quality metrics, and best practice recommendations. A written personalized care plan for preventive services as well as general preventive health recommendations were provided to patient.     Kieth Pelt, LPN   1/61/0960   After Visit Summary: (MyChart) Due to this being a telephonic visit, the after visit summary with patients personalized plan was offered to patient via MyChart   Nurse Notes:

## 2024-05-02 ENCOUNTER — Other Ambulatory Visit: Payer: Self-pay | Admitting: Primary Care

## 2024-05-02 NOTE — Telephone Encounter (Signed)
 Copied from CRM [id number]. Topic: Clinical - Medication Refill >> May 02, 2024 12:10 PM Corean R wrote: Medication: albuterol  (PROVENTIL ) (2.5 MG/3ML) 0.083% nebulizer solution  Has the patient contacted their pharmacy? Yes, patiens states the refills request was denied by clinic.  (Agent: If no, request that the patient contact the pharmacy for the refill. If patient does not wish to contact the pharmacy document the reason why and proceed with request.) (Agent: If yes, when and what did the pharmacy advise?)  This is the patient's preferred pharmacy:    [id number] - [place], [place] - [address] [place] [address] Phone: [phone] Fax: [phone]  Is this the correct pharmacy for this prescription?  If no, delete pharmacy and type the correct one.   Has the prescription been filled recently? No  Is the patient out of the medication? Yes  Has the patient been seen for an appointment in the last year OR does the patient have an upcoming appointment? Yes  Can we respond through MyChart? No  Agent: Please be advised that Rx refills may take up to 3 business days. We ask that you follow-up with your pharmacy.

## 2024-05-03 MED ORDER — ALBUTEROL SULFATE (2.5 MG/3ML) 0.083% IN NEBU: 2.5000 mg | INHALATION_SOLUTION | Freq: Four times a day (QID) | RESPIRATORY_TRACT | 0 refills | Status: AC | PRN

## 2024-05-10 ENCOUNTER — Telehealth: Payer: Self-pay | Admitting: Primary Care

## 2024-05-10 NOTE — Telephone Encounter (Signed)
 Patient is in office. He requested a refill from [facility]  for Albuterol  inhaler HFA but received albuterol  solution (seems to be for a nebulizer). He will like his inhaler refilled. His last appointment was in December of 2024 but has an upcoming appointment in September with Almarie Ferrari.   Pharmacy: [facility] on [address] [place]

## 2024-05-11 MED ORDER — ALBUTEROL SULFATE HFA 108 (90 BASE) MCG/ACT IN AERS: 1.0000 | INHALATION_SPRAY | RESPIRATORY_TRACT | 3 refills | Status: AC | PRN

## 2024-05-11 NOTE — Telephone Encounter (Signed)
 I called and spoke to pt. Pt states he needed his Albuterol  Inhaler refilled. I informed pt I would send this in for him. Pt verbalized understanding. NFN

## 2024-05-16 ENCOUNTER — Encounter: Payer: Medicare Other | Admitting: Family Medicine

## 2024-05-16 ENCOUNTER — Other Ambulatory Visit: Payer: Self-pay | Admitting: *Deleted

## 2024-05-16 DIAGNOSIS — I6523 Occlusion and stenosis of bilateral carotid arteries: Secondary | ICD-10-CM

## 2024-05-16 DIAGNOSIS — E7800 Pure hypercholesterolemia, unspecified: Secondary | ICD-10-CM

## 2024-05-16 DIAGNOSIS — I10 Essential (primary) hypertension: Secondary | ICD-10-CM

## 2024-05-16 DIAGNOSIS — I2510 Atherosclerotic heart disease of native coronary artery without angina pectoris: Secondary | ICD-10-CM

## 2024-05-16 MED ORDER — AMLODIPINE BESYLATE 5 MG PO TABS
5.0000 mg | ORAL_TABLET | Freq: Every day | ORAL | 0 refills | Status: DC
Start: 2024-05-16 — End: 2024-07-01

## 2024-05-20 NOTE — Progress Notes (Signed)
 [name] [address] [place] [address] Phone: [phone] Subjective:   [name], am serving as a scribe for Dr. Arthea Claudene.  I'm seeing this patient by the request  of:  Mahlon Comer BRAVO, MD  CC: Left hip and low back pain  YEP:Dlagzrupcz  03/16/2024 Known spinal stenosis and will need to monitor.  Could be getting more atrophy and weakness that could be contributing to this discomfort.   Injection given today and tolerated the procedure well.  Discussed icing regimen and home exercises, discussed which activities to do and which ones to avoid.  Follow-up again in 6 to 8 weeks     Update 05/23/2024 [name] is a 89 y.o. male coming in with complaint of L hip and lower back pain. Patient states injection helped last appointment. Took pain away. Hasn't returned. No new symptoms or concerns.      Past Medical History:  Diagnosis Date   Allergy    Arthritis    scattered (10/26/2018)   Asthma    C. difficile diarrhea    CAD (coronary artery disease) 2005   s/p PCI of RCA   Carotid stenosis    Carotid dopplers showed 1-39% right and 40-59% left carotid artery stenosis 01/2024   Cataract    NS OD   Cognitive impairment, mild, so stated    Colitis    Erectile dysfunction    GERD (gastroesophageal reflux disease)    Hyperlipidemia    Hypertension    Perforated stomach, acute 10/23/2018   Peripheral vascular disease (HCC)    Retinal detachment    Vertebrobasilar artery syndrome    Vertigo    Past Surgical History:  Procedure Laterality Date   APPENDECTOMY  1954   CATARACT EXTRACTION Left 08/08/2019   Dr. Camillo   COLONOSCOPY     ESOPHAGOGASTRODUODENOSCOPY (EGD) WITH ESOPHAGEAL DILATION  09/2018   EYE SURGERY     LAPAROTOMY N/A 10/23/2018   Procedure: EXPLORATORY LAPAROTOMY WITH GRAHAM PATCH FOR PERFORATED ULCER AND PLACEMENT OF GASTRIC TUBE;  Surgeon: Ebbie Cough, MD;  Location: [facility] OR;  Service: General;   Laterality: N/A;   NASAL SINUS SURGERY  05/2018   RETINAL DETACHMENT SURGERY     ? side   UPPER GASTROINTESTINAL ENDOSCOPY     Social History   Socioeconomic History   Marital status: Married    Spouse name: Not on file   Number of children: 3   Years of education: Not on file   Highest education level: Master's degree (e.g., MA, MS, MEng, MEd, MSW, MBA)  Occupational History   Occupation: Retired Teaching laboratory technician: RETIRED  Tobacco Use   Smoking status: Former    Types: Cigars    Quit date: 05/13/1961    Years since quitting: 63.0    Passive exposure: Never   Smokeless tobacco: Never   Tobacco comments:    never inhaled in the early 60's   Vaping Use   Vaping status: Never Used  Substance and Sexual Activity   Alcohol use: Yes    Alcohol/week: 14.0 standard drinks of alcohol    Types: 14 Glasses of wine per week   Drug use: Never   Sexual activity: Not Currently  Other Topics Concern   Not on file  Social History Narrative   Married with 1 son and 2 daughters.  Retired.   2 caffeinated beverages daily, 3 glasses of wine daily   Never smoker no drug use  no tobacco   Social Drivers of Corporate investment banker Strain: Low Risk  (04/05/2024)   Overall Financial Resource Strain (CARDIA)    Difficulty of Paying Living Expenses: Not hard at all  Food Insecurity: No Food Insecurity (04/05/2024)   Hunger Vital Sign    Worried About Running Out of Food in the Last Year: Never true    Ran Out of Food in the Last Year: Never true  Transportation Needs: No Transportation Needs (04/05/2024)   PRAPARE - Administrator, Civil Service (Medical): No    Lack of Transportation (Non-Medical): No  Physical Activity: Insufficiently Active (04/05/2024)   Exercise Vital Sign    Days of Exercise per Week: 2 days    Minutes of Exercise per Session: 10 min  Stress: No Stress Concern Present (04/05/2024)   [employer] of Occupational Health -  Occupational Stress Questionnaire    Feeling of Stress : Not at all  Social Connections: Moderately Integrated (04/05/2024)   Social Connection and Isolation Panel    Frequency of Communication with Friends and Family: More than three times a week    Frequency of Social Gatherings with Friends and Family: Twice a week    Attends Religious Services: Never    Diplomatic Services operational officer: No    Attends Engineer, structural: More than 4 times per year    Marital Status: Married   Allergies  Allergen Reactions   Neosporin [Bacitracin-Polymyxin B] Other (See Comments)    Redness   Family History  Problem Relation Age of Onset   Stroke Mother    Hyperlipidemia Mother    Hypertension Mother    Deep vein thrombosis Mother    Stroke Father    Hyperlipidemia Father    Hypertension Father    Deep vein thrombosis Father    Diverticulitis Father    Ulcerative colitis Father    Colon polyps Father    Colon cancer Neg Hx    Esophageal cancer Neg Hx    Stomach cancer Neg Hx    Rectal cancer Neg Hx      Current Outpatient Medications (Cardiovascular):    amLODipine  (NORVASC ) 5 MG tablet, Take 1 tablet (5 mg total) by mouth daily.   atorvastatin  (LIPITOR ) 80 MG tablet, TAKE 1 TABLET(80 MG) BY MOUTH DAILY   carvedilol  (COREG ) 6.25 MG tablet, Take 1 tablet (6.25 mg total) by mouth 2 (two) times daily with a meal. Please keep upcoming appt for future refills Thanks   hydrochlorothiazide  (HYDRODIURIL ) 25 MG tablet, TAKE 1 TABLET(25 MG) BY MOUTH DAILY  Current Outpatient Medications (Respiratory):    albuterol  (PROVENTIL ) (2.5 MG/3ML) 0.083% nebulizer solution, Take 3 mLs (2.5 mg total) by nebulization every 6 (six) hours as needed for wheezing or shortness of breath.   albuterol  (VENTOLIN  HFA) 108 (90 Base) MCG/ACT inhaler, Inhale 1-2 puffs into the lungs every 4 (four) hours as needed for wheezing or shortness of breath. Patient need to keep office visit to get more refill's  .   azelastine  (ASTELIN ) 0.1 % nasal spray, USE 1 SPRAY IN EACH NOSTRIL TWICE DAILY   fluticasone  (FLONASE ) 50 MCG/ACT nasal spray, 1 spray each nostril twice daily   fluticasone  furoate-vilanterol (BREO ELLIPTA ) 100-25 MCG/ACT AEPB, Inhale 1 puff into the lungs daily.   fluticasone  furoate-vilanterol (BREO ELLIPTA ) 100-25 MCG/ACT AEPB, Inhale 1 puff into the lungs daily.   montelukast  (SINGULAIR ) 10 MG tablet, Take 1 tablet (10 mg total) by mouth at bedtime.  Current  Outpatient Medications (Analgesics):    acetaminophen  (TYLENOL ) 500 MG tablet, Take 500 mg by mouth 2 (two) times daily.   aspirin  EC 81 MG tablet, Take 81 mg by mouth daily.   Current Outpatient Medications (Other):    cholecalciferol (VITAMIN D3) 25 MCG (1000 UT) tablet, Take 1,000 Units by mouth daily.   donepezil  (ARICEPT ) 10 MG tablet, TAKE 1 TABLET BY MOUTH DAILY   doxycycline  (ADOXA) 100 MG tablet, Take 100 mg by mouth 2 (two) times daily.   Eyelid Cleansers (OCUSOFT EYELID CLEANSING) PADS, Place 1 application into both eyes 2 (two) times daily.   fish oil-omega-3 fatty acids  1000 MG capsule, Take 2 capsules (2 g total) by mouth 2 (two) times daily.   gabapentin  (NEURONTIN ) 300 MG capsule, TAKE 1 CAPSULE(300 MG) BY MOUTH THREE TIMES DAILY   Polyvinyl Alcohol-Povidone (REFRESH OP), Place 1 drop into both eyes 2 (two) times daily.   Saw Palmetto 500 MG CAPS, Take 1,000 mg by mouth 2 (two) times daily.    Reviewed prior external information including notes and imaging from  primary care provider As well as notes that were available from care everywhere and other healthcare systems.  Past medical history, social, surgical and family history all reviewed in electronic medical record.  No pertanent information unless stated regarding to the chief complaint.   Review of Systems:  No headache, visual changes, nausea, vomiting, diarrhea, constipation, dizziness, abdominal pain, skin rash, fevers, chills, night sweats, weight  loss, swollen lymph nodes, body aches, joint swelling, chest pain, shortness of breath, mood changes. POSITIVE muscle aches stable  Objective  Blood pressure 130/64, pulse 72, height 5' 5 (1.651 m), weight 144 lb (65.3 kg), SpO2 96%.   General: No apparent distress alert and oriented x3 mood and affect normal, dressed appropriately.  HEENT: Pupils equal, extraocular movements intact  Respiratory: Patient's speak in full sentences and does not appear short of breath  Cardiovascular: No lower extremity edema, non tender, no erythema  Significant loss of lordosis in the lumbar spine.  Does have some atrophy of the musculature in the hips.  Nontender on exam no today.    Impression and Recommendations:    The above documentation has been reviewed and is accurate and complete Chelsia Serres M Lorianne Malbrough, DO

## 2024-05-23 ENCOUNTER — Ambulatory Visit: Admitting: Family Medicine

## 2024-05-23 VITALS — BP 130/64 | HR 72 | Ht 65.0 in | Wt 144.0 lb

## 2024-05-23 DIAGNOSIS — M7602 Gluteal tendinitis, left hip: Secondary | ICD-10-CM | POA: Diagnosis not present

## 2024-05-23 NOTE — Assessment & Plan Note (Signed)
 Much better after injection  Continue hep   Rtc in 3 months

## 2024-05-31 ENCOUNTER — Other Ambulatory Visit: Payer: Self-pay

## 2024-05-31 MED ORDER — CARVEDILOL 6.25 MG PO TABS: 6.2500 mg | ORAL_TABLET | Freq: Two times a day (BID) | ORAL | 0 refills | Status: DC

## 2024-06-01 ENCOUNTER — Ambulatory Visit: Admitting: Family Medicine

## 2024-06-01 ENCOUNTER — Encounter: Payer: Self-pay | Admitting: Family Medicine

## 2024-06-01 VITALS — BP 110/52 | HR 57 | Temp 98.0°F | Ht 65.0 in | Wt 141.4 lb

## 2024-06-01 DIAGNOSIS — R197 Diarrhea, unspecified: Secondary | ICD-10-CM

## 2024-06-01 LAB — CBC WITH DIFFERENTIAL/PLATELET
Basophils Absolute: 0.1 K/uL (ref 0.0–0.1)
Basophils Relative: 0.8 % (ref 0.0–3.0)
Eosinophils Absolute: 0.1 K/uL (ref 0.0–0.7)
Eosinophils Relative: 1.3 % (ref 0.0–5.0)
HCT: 36.8 % — ABNORMAL LOW (ref 39.0–52.0)
Hemoglobin: 12.4 g/dL — ABNORMAL LOW (ref 13.0–17.0)
Lymphocytes Relative: 17.9 % (ref 12.0–46.0)
Lymphs Abs: 1.6 K/uL (ref 0.7–4.0)
MCHC: 33.8 g/dL (ref 30.0–36.0)
MCV: 88.1 fl (ref 78.0–100.0)
Monocytes Absolute: 0.9 K/uL (ref 0.1–1.0)
Monocytes Relative: 10.2 % (ref 3.0–12.0)
Neutro Abs: 6.3 K/uL (ref 1.4–7.7)
Neutrophils Relative %: 69.8 % (ref 43.0–77.0)
Platelets: 224 K/uL (ref 150.0–400.0)
RBC: 4.17 Mil/uL — ABNORMAL LOW (ref 4.22–5.81)
RDW: 13.6 % (ref 11.5–15.5)
WBC: 9 K/uL (ref 4.0–10.5)

## 2024-06-01 LAB — HEPATIC FUNCTION PANEL
ALT: 27 U/L (ref 0–53)
AST: 27 U/L (ref 0–37)
Albumin: 3.8 g/dL (ref 3.5–5.2)
Alkaline Phosphatase: 61 U/L (ref 39–117)
Bilirubin, Direct: 0.1 mg/dL (ref 0.0–0.3)
Total Bilirubin: 0.5 mg/dL (ref 0.2–1.2)
Total Protein: 6.7 g/dL (ref 6.0–8.3)

## 2024-06-01 LAB — BASIC METABOLIC PANEL WITH GFR
BUN: 18 mg/dL (ref 6–23)
CO2: 33 meq/L — ABNORMAL HIGH (ref 19–32)
Calcium: 8.9 mg/dL (ref 8.4–10.5)
Chloride: 103 meq/L (ref 96–112)
Creatinine, Ser: 1.18 mg/dL (ref 0.40–1.50)
GFR: 55.69 mL/min — ABNORMAL LOW (ref >60.00)
Glucose, Bld: 90 mg/dL (ref 70–99)
Potassium: 3.9 meq/L (ref 3.5–5.1)
Sodium: 142 meq/L (ref 135–145)

## 2024-06-01 NOTE — Patient Instructions (Signed)
 Follow up as needed or as scheduled We'll notify you of your lab results and make any changes if needed Drink LOTS of fluids to ensure you don't get dehydrated Call with any questions or concerns Stay Safe!  Stay Healthy! Hang in there!!!

## 2024-06-01 NOTE — Progress Notes (Signed)
   Subjective:    Patient ID: [name], male    DOB: 1935/03/07, 89 y.o.   MRN: [id number]  HPI Diarrhea- sxs started 'a little over a week' ago.  Stools are 'dark colored water'.  Having a hard time making it to the bathroom.  Denies abd cramping or pain.  Occurring ~3x/day.  No blood in stool.  Taking Imodium w/o relief.  No recent abx.  No changes in medication.  No change in diet.  No N/V.  No known sick contacts.  Has very strong, 'foul' odor.  Pt has hx of C Diff   Review of Systems For ROS see HPI     Objective:   Physical Exam Vitals reviewed.  Constitutional:      General: He is not in acute distress.    Appearance: Normal appearance. He is well-developed.  HENT:     Head: Normocephalic and atraumatic.     Mouth/Throat:     Mouth: Mucous membranes are moist.  Eyes:     Extraocular Movements: Extraocular movements intact.     Conjunctiva/sclera: Conjunctivae normal.  Cardiovascular:     Rate and Rhythm: Normal rate and regular rhythm.     Heart sounds: Normal heart sounds.  Pulmonary:     Effort: Pulmonary effort is normal. No respiratory distress.     Breath sounds: Normal breath sounds. No wheezing or rales.  Abdominal:     General: Bowel sounds are normal. There is no distension.     Palpations: Abdomen is soft.     Tenderness: There is no abdominal tenderness. There is no guarding or rebound.  Musculoskeletal:     Cervical back: Neck supple.  Lymphadenopathy:     Cervical: No cervical adenopathy.  Skin:    General: Skin is warm and dry.  Neurological:     Mental Status: He is alert. Mental status is at baseline.           Assessment & Plan:  Diarrhea- new.  Wife reports this started just over a week ago and has not been improving.  ~3 watery stools/day w/ inability to control bowels.  + foul smelling.  Hx of C Diff.  Will get labs to assess electrolytes, CBC to look for systemic infxn, and stool studies.  Pt expressed understanding and is in agreement  w/ plan.

## 2024-06-02 ENCOUNTER — Telehealth: Payer: Self-pay

## 2024-06-02 ENCOUNTER — Ambulatory Visit: Payer: Self-pay | Admitting: Family Medicine

## 2024-06-02 NOTE — Progress Notes (Signed)
 LVM to call or look at results via MyChart

## 2024-06-02 NOTE — Telephone Encounter (Signed)
 Patient came in for an appt on 06/01/24 and provided a stool samples. Samples were refrigerated and were placed in a refrigerated pack with refrigerated stickers on the sample bag. Refrigerated bag with samples inside was placed in the drop box at 4:55pm to be picked up by [facility]. Manager confirmed the sample was picked up overnight.

## 2024-06-03 ENCOUNTER — Encounter: Payer: Self-pay | Admitting: Family Medicine

## 2024-06-03 LAB — SPECIMEN STATUS REPORT

## 2024-06-03 NOTE — Progress Notes (Signed)
 I have reached out to lab and waiting for a response on lab, CBC

## 2024-06-04 LAB — SPECIMEN STATUS REPORT

## 2024-06-04 LAB — STOOL CULTURE

## 2024-06-04 LAB — CLOSTRIDIUM DIFFICILE BY PCR

## 2024-06-07 LAB — STOOL CULTURE: E coli, Shiga toxin Assay: NEGATIVE

## 2024-06-07 LAB — SPECIMEN STATUS REPORT

## 2024-06-07 NOTE — Progress Notes (Signed)
 Per lab : they stool was room tempeture.

## 2024-06-08 MED ORDER — AZITHROMYCIN 250 MG PO TABS: ORAL_TABLET | ORAL | 0 refills | Status: AC

## 2024-06-08 NOTE — Progress Notes (Signed)
 LVM to call office.

## 2024-06-13 ENCOUNTER — Ambulatory Visit: Payer: Self-pay

## 2024-06-13 LAB — STOOL CULTURE

## 2024-06-13 NOTE — Telephone Encounter (Signed)
 FYI, patient is going to get scheduled with Sports Med

## 2024-06-13 NOTE — Telephone Encounter (Signed)
 Patient was transferred over to the [facility] to get scheduled to see Dr Claudene that he had seen previously about his left hip pain  FYI Only or Action Required?: FYI only for provider.  Patient was last seen in primary care on 06/01/2024 by Mahlon Comer BRAVO, MD.  Called Nurse Triage reporting Hip Pain.  Symptoms began about a week ago.  Interventions attempted: Rest, hydration, or home remedies.  Symptoms are: gradually worsening.  Triage Disposition: See PCP When Office is Open (Within 3 Days)  Patient/caregiver understands and will follow disposition?: Yes--Patient transferred over to [facility] at [facility] for further assistance getting scheduled            Copied from CRM [id number]. Topic: Clinical - Red Word Triage >> Jun 13, 2024  3:33 PM Martin[place] E wrote: [facility] that prompted transfer to Nurse Triage: Pain in left hip, been going on for a couple months. Saw Dr. Claudene last month and got injection that helped, but pain is now back. Reason for Disposition  [1] MODERATE pain (e.g., interferes with normal activities, limping) AND [2] present > 3 days  Answer Assessment - Initial Assessment Questions 1. LOCATION and RADIATION: Where is the pain located? Does the pain spread (shoot) anywhere else?     Left hip 2. QUALITY: What does the pain feel like?  (e.g., sharp, dull, aching, burning)     Hurts with walking 3. SEVERITY: How bad is the pain? What does it keep you from doing?   (Scale 1-10; or mild, moderate, severe)     8 4. ONSET: When did the pain start? Does it come and go, or is it there all the time?     About a week ago 5. WORK OR EXERCISE: Has there been any recent work or exercise that involved this part of the body?      Daily use walking 6. CAUSE: What do you think is causing the hip pain?      ----- 7. AGGRAVATING FACTORS: What makes the hip pain worse? (e.g., walking, climbing stairs,  running)     walking 8. OTHER SYMPTOMS: Do you have any other symptoms? (e.g., back pain, pain shooting down leg,  fever, rash)     No  Protocols used: Hip Pain-A-AH

## 2024-07-01 ENCOUNTER — Telehealth: Payer: Self-pay | Admitting: Cardiology

## 2024-07-01 DIAGNOSIS — I2510 Atherosclerotic heart disease of native coronary artery without angina pectoris: Secondary | ICD-10-CM

## 2024-07-01 DIAGNOSIS — E7800 Pure hypercholesterolemia, unspecified: Secondary | ICD-10-CM

## 2024-07-01 DIAGNOSIS — I10 Essential (primary) hypertension: Secondary | ICD-10-CM

## 2024-07-01 DIAGNOSIS — I6523 Occlusion and stenosis of bilateral carotid arteries: Secondary | ICD-10-CM

## 2024-07-01 MED ORDER — AMLODIPINE BESYLATE 5 MG PO TABS: 5.0000 mg | ORAL_TABLET | Freq: Every day | ORAL | 0 refills | Status: DC

## 2024-07-01 MED ORDER — CARVEDILOL 6.25 MG PO TABS: 6.2500 mg | ORAL_TABLET | Freq: Two times a day (BID) | ORAL | 0 refills | Status: DC

## 2024-07-01 NOTE — Telephone Encounter (Signed)
 RX sent in

## 2024-07-01 NOTE — Telephone Encounter (Signed)
 Pt's medication was sent to pt's pharmacy as requested. Confirmation received.

## 2024-07-01 NOTE — Telephone Encounter (Signed)
 Patient called and a detailed message was left for the patient explaining that his Amlodipine  had been re-filled and sent to the patient's pharmacy.

## 2024-07-01 NOTE — Telephone Encounter (Signed)
*  STAT* If patient is at the pharmacy, call can be transferred to refill team.   1. Which medications need to be refilled? (please list name of each medication and dose if known) carvedilol  (COREG ) 6.25 MG tablet    2. Would you like to learn more about the convenience, safety, & potential cost savings by using the [facility]? NO     3. Are you open to using the [facility]? NO   4. Which pharmacy/location (including street and city if local pharmacy) is medication to be sent to? [id number] - [place], [place] - [address]    5. Do they need a 30 day or 90 day supply? 60

## 2024-07-01 NOTE — Telephone Encounter (Signed)
*  STAT* If patient is at the pharmacy, call can be transferred to refill team.   1. Which medications need to be refilled? (please list name of each medication and dose if known)   amLODipine  (NORVASC ) 5 MG tablet   2. Would you like to learn more about the convenience, safety, & potential cost savings by using the [facility]?   3. Are you open to using the [facility] (Type [facility]. ).  4. Which pharmacy/location (including street and city if local pharmacy) is medication to be sent to?  [id number] - [place], [place] - [address] AT [address]   5. Do they need a 30 day or 90 day supply?   90 day  Patient stated he is completely out of this medication.

## 2024-07-06 ENCOUNTER — Encounter: Payer: Self-pay | Admitting: Primary Care

## 2024-07-06 ENCOUNTER — Telehealth: Payer: Self-pay | Admitting: Primary Care

## 2024-07-06 ENCOUNTER — Ambulatory Visit: Admitting: Primary Care

## 2024-07-06 ENCOUNTER — Other Ambulatory Visit: Payer: Self-pay | Admitting: Family Medicine

## 2024-07-06 ENCOUNTER — Other Ambulatory Visit (HOSPITAL_COMMUNITY): Payer: Self-pay

## 2024-07-06 VITALS — BP 130/50 | HR 63 | Temp 97.7°F | Ht 65.0 in | Wt 143.4 lb

## 2024-07-06 DIAGNOSIS — J45909 Unspecified asthma, uncomplicated: Secondary | ICD-10-CM | POA: Diagnosis not present

## 2024-07-06 DIAGNOSIS — Z23 Encounter for immunization: Secondary | ICD-10-CM

## 2024-07-06 MED ORDER — MONTELUKAST SODIUM 10 MG PO TABS: 10.0000 mg | ORAL_TABLET | Freq: Every day | ORAL | 3 refills | Status: AC

## 2024-07-06 NOTE — Telephone Encounter (Signed)
 Please let patient know BREO is 120 for 90 day supply, no cheaper alternative We can send in 3 month supply if needed

## 2024-07-06 NOTE — Progress Notes (Signed)
 @Patient  ID: [name], male    DOB: 01-18-1935, 89 y.o.   MRN: [id number]  Chief Complaint  Patient presents with   Asthma    Referring provider: Mahlon Comer BRAVO, MD  HPI:  89 year old male, former smoker. PMH significant hypertension, cardiomyopathy, coronary artery disease, carotid artery stenosis, mild intermittent asthma, chronic rhinitis, hyperlipidemia, mild cognitive impairment.  Patient of Dr. Brenna.  Previous [facility] pulmonary encounter This is an 89 year old gentleman seen in clinic today to establish care with pulmonary after a follow-up for recent pneumonia.  Patient was seen initially by Landry Ferrari, NP in January 2020 for evaluation of cough.  In March 2020 patient was seen for possible pneumonia.  He had an HRCT which showed clustered centrilobular and tree-in-bud nodular opacities within the right lung at the time considered relation to aspiration.  He had sputum cultures with normal growth and AFB was negative.  Treated with Brio inhaler.  He is a former smoker, smoked in [employer] cigars less than 2 years.  Has a past medical history of chronic cough, coronary artery disease hypertension.  In 2019 had a perforated ulcer.  Patient was initially seen and evaluated by Dr. Geronimo.  He was seen as a pulmonary consultation in December 2019.  This was during his hospitalization for a perforated gastric ulcer status post Arlyss patch.  Non smoker, [employer], was in [place] at [employer], masters in [employer] then went to [employer]. Joined [employer] afterwards. 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 It was used for planning purposes. 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 He worked for [employer] 35 years and retired in 2000.  Of note the patient is from [facility] [place]  and [place].  Patient has been seen in the past by ear nose and throat for chronic nasal congestion.  He  has a history of polyps that have been removed in the past.  He is currently on a regular nasal spray.  He still feels like his congestion in his nose makes his breathing worse at times.  OV 04/07/2022: Here today for follow-up regarding management of mild intermittent asthma.  Doing really well.  Has not been seen by me since 2020.  From respiratory standpoint doing well.  He intermittently uses his Breo because he is worried about falling into the donut hole too soon in October.  This increases cost of medication significantly.  So he sparingly uses it.  But he does breathe better when he is able to get the medication and use it daily.   10/16/2023 Discussed the use of AI scribe software for clinical note transcription with the patient, who gave verbal consent to proceed.  History of Present Illness   The patient, with a history of pneumonia, mild intermittent asthma, and nasal polyps, was last seen in June 2023. She has been managed for asthma and unspecified dysphagia following pneumonia. The patient has been on daily Breo inhaler for asthma management. Since the last visit, the patient reports no respiratory issues, no recurrent pneumonias, and no hospitalizations. She denies any bronchitis or upper respiratory infections. The patient occasionally experiences coughing but denies any wheezing. She admits to sometimes forgetting to take the Breo inhaler daily, but does not notice any worsening symptoms on the days it is missed. The patient does not recall having asthma as a child. She also  reports daily use of a nasal spray due to postnasal drip. The patient has albuterol  as a rescue inhaler for emergency use.       07/06/2024- Interim hx  Discussed the use of AI scribe software for clinical note transcription with the patient, who gave verbal consent to proceed.  History of Present Illness [name] is an 89 year old male with asthma who presents for a follow-up visit.  He uses Breo daily as  needed for asthma, which is completely controlled. No recent episodes of wheezing, chest tightness, shortness of breath, or cough. No hospitalizations for breathing issues in the past three months and no asthma flare-ups. He does not use his rescue inhaler, and asthma has not interfered with daily activities at work or home in the past month.  A breathing test in 2020 was normal, and a CT scan in August 2020 showed resolution of a previous pneumonia. Breo costs $70 per bottle, and he has inquired about cheaper alternatives. Currently, his insurance covers [place], and he does not need a refill at this time.  He is active, engaging in yard work and bird feeding activities, including climbing a ladder to refill bird feeders. He recalls an incident 20 years ago where he fell from a ladder and broke some ribs but has since made changes to avoid such accidents.  He is up to date with his pneumonia vaccine but has not yet received the flu vaccine this year. He is open to receiving it if available during the visit.  He requires a refill of montelukast , which he takes at bedtime.  No recent wheezing, chest tightness, shortness of breath, or cough. No asthma-related symptoms waking him at night in the past month and no need to use his rescue inhaler.    Allergies  Allergen Reactions   Neosporin [Bacitracin-Polymyxin B] Other (See Comments)    Redness    Immunization History  Administered Date(s) Administered   Fluad Quad(high Dose 65+) 06/24/2019   Fluad Trivalent(High Dose 65+) 07/03/2023   INFLUENZA, HIGH DOSE SEASONAL PF 08/14/2014, 07/16/2018, 07/06/2024   Influenza Split 08/25/2011, 08/09/2012   Influenza Whole 07/11/2010   Influenza,inj,Quad PF,6+ Mos 08/11/2013, 08/18/2014, 07/11/2015, 07/14/2016, 07/20/2017   Influenza-Unspecified 07/24/2000, 07/27/2022   PFIZER(Purple Top)SARS-COV-2 Vaccination 11/15/2019, 12/06/2019, 07/27/2020, 03/08/2021   Pneumococcal Conjugate-13 01/25/2015    Pneumococcal Polysaccharide-23 09/15/2002   Td 12/13/2007, 05/31/2019   Zoster Recombinant(Shingrix) 05/29/2020, 08/14/2020   Zoster, Live 10/29/2010    Past Medical History:  Diagnosis Date   Allergy    Arthritis    scattered (10/26/2018)   Asthma    C. difficile diarrhea    CAD (coronary artery disease) 2005   s/p PCI of RCA   Carotid stenosis    Carotid dopplers showed 1-39% right and 40-59% left carotid artery stenosis 01/2024   Cataract    NS OD   Cognitive impairment, mild, so stated    Colitis    Erectile dysfunction    GERD (gastroesophageal reflux disease)    Hyperlipidemia    Hypertension    Perforated stomach, acute 10/23/2018   Peripheral vascular disease (HCC)    Retinal detachment    Vertebrobasilar artery syndrome    Vertigo     Tobacco History: Social History   Tobacco Use  Smoking Status Former   Types: Cigars   Quit date: 05/13/1961   Years since quitting: 63.1   Passive exposure: Never  Smokeless Tobacco Never  Tobacco Comments   never inhaled in the early 60's  Counseling given: Not Answered Tobacco comments: never inhaled in the early 60's    Outpatient Medications Prior to Visit  Medication Sig Dispense Refill   acetaminophen  (TYLENOL ) 500 MG tablet Take 500 mg by mouth 2 (two) times daily.     albuterol  (PROVENTIL ) (2.5 MG/3ML) 0.083% nebulizer solution Take 3 mLs (2.5 mg total) by nebulization every 6 (six) hours as needed for wheezing or shortness of breath. 120 mL 0   albuterol  (VENTOLIN  HFA) 108 (90 Base) MCG/ACT inhaler Inhale 1-2 puffs into the lungs every 4 (four) hours as needed for wheezing or shortness of breath. Patient need to keep office visit to get more refill's . 8.5 g 3   amLODipine  (NORVASC ) 5 MG tablet Take 1 tablet (5 mg total) by mouth daily. 15 tablet 0   aspirin  EC 81 MG tablet Take 81 mg by mouth daily.     atorvastatin  (LIPITOR ) 80 MG tablet TAKE 1 TABLET(80 MG) BY MOUTH DAILY 90 tablet 3   azelastine   (ASTELIN ) 0.1 % nasal spray USE 1 SPRAY IN EACH NOSTRIL TWICE DAILY 30 mL 5   carvedilol  (COREG ) 6.25 MG tablet Take 1 tablet (6.25 mg total) by mouth 2 (two) times daily with a meal. 30 tablet 0   cholecalciferol (VITAMIN D3) 25 MCG (1000 UT) tablet Take 1,000 Units by mouth daily.     donepezil  (ARICEPT ) 10 MG tablet TAKE 1 TABLET BY MOUTH DAILY 90 tablet 1   doxycycline  (ADOXA) 100 MG tablet Take 100 mg by mouth 2 (two) times daily.     Eyelid Cleansers (OCUSOFT EYELID CLEANSING) PADS Place 1 application into both eyes 2 (two) times daily.     fish oil-omega-3 fatty acids  1000 MG capsule Take 2 capsules (2 g total) by mouth 2 (two) times daily. 360 capsule 3   fluticasone  (FLONASE ) 50 MCG/ACT nasal spray 1 spray each nostril twice daily 16 g 11   fluticasone  furoate-vilanterol (BREO ELLIPTA ) 100-25 MCG/ACT AEPB Inhale 1 puff into the lungs daily. 60 each 3   fluticasone  furoate-vilanterol (BREO ELLIPTA ) 100-25 MCG/ACT AEPB Inhale 1 puff into the lungs daily. 60 each 3   gabapentin  (NEURONTIN ) 300 MG capsule TAKE 1 CAPSULE(300 MG) BY MOUTH THREE TIMES DAILY 90 capsule 3   hydrochlorothiazide  (HYDRODIURIL ) 25 MG tablet TAKE 1 TABLET(25 MG) BY MOUTH DAILY 90 tablet 0   Polyvinyl Alcohol-Povidone (REFRESH OP) Place 1 drop into both eyes 2 (two) times daily.     Saw Palmetto 500 MG CAPS Take 1,000 mg by mouth 2 (two) times daily.      montelukast  (SINGULAIR ) 10 MG tablet Take 1 tablet (10 mg total) by mouth at bedtime. 90 tablet 3   No facility-administered medications prior to visit.   Review of Systems  Review of Systems  Constitutional: Negative.   Respiratory: Negative.  Negative for cough, chest tightness, shortness of breath and wheezing.    Physical Exam  BP (!) 130/50 (BP Location: Left Arm, Patient Position: Sitting)   Pulse 63   Temp 97.7 F (36.5 C) (Oral)   Ht 5' 5 (1.651 m)   Wt 143 lb 6.4 oz (65 kg)   SpO2 97% Comment: RA  BMI 23.86 kg/m  Physical Exam Constitutional:       Appearance: Normal appearance. He is well-developed.  HENT:     Head: Normocephalic and atraumatic.     Mouth/Throat:     Mouth: Mucous membranes are moist.     Pharynx: Oropharynx is clear.  Cardiovascular:  Rate and Rhythm: Normal rate and regular rhythm.     Heart sounds: Normal heart sounds.  Pulmonary:     Effort: Pulmonary effort is normal. No respiratory distress.     Breath sounds: Normal breath sounds. No wheezing or rhonchi.  Musculoskeletal:        General: Normal range of motion.     Cervical back: Normal range of motion and neck supple.  Skin:    General: Skin is warm and dry.     Findings: No erythema or rash.  Neurological:     General: No focal deficit present.     Mental Status: He is alert and oriented to person, place, and time. Mental status is at baseline.  Psychiatric:        Mood and Affect: Mood normal.        Behavior: Behavior normal.        Thought Content: Thought content normal.        Judgment: Judgment normal.      Lab Results:  CBC    Component Value Date/Time   WBC 9.0 06/01/2024 0958   RBC 4.17 (L) 06/01/2024 0958   HGB 12.4 (L) 06/01/2024 0958   HCT 36.8 (L) 06/01/2024 0958   PLT 224.0 06/01/2024 0958   MCV 88.1 06/01/2024 0958   MCH 28.7 11/02/2020 1428   MCHC 33.8 06/01/2024 0958   RDW 13.6 06/01/2024 0958   LYMPHSABS 1.6 06/01/2024 0958   MONOABS 0.9 06/01/2024 0958   EOSABS 0.1 06/01/2024 0958   BASOSABS 0.1 06/01/2024 0958    BMET    Component Value Date/Time   NA 142 06/01/2024 0958   NA 138 09/09/2019 0831   K 3.9 06/01/2024 0958   CL 103 06/01/2024 0958   CO2 33 (H) 06/01/2024 0958   GLUCOSE 90 06/01/2024 0958   GLUCOSE 98 10/04/2010 0000   BUN 18 06/01/2024 0958   BUN 8 09/09/2019 0831   CREATININE 1.18 06/01/2024 0958   CREATININE 1.09 11/02/2020 1428   CALCIUM  8.9 06/01/2024 0958   GFRNONAA 72 09/09/2019 0831   GFRAA 83 09/09/2019 0831    BNP No results found for: BNP  ProBNP No results  found for: PROBNP  Imaging: No results found.   Assessment & Plan:    1. Mild asthma, unspecified whether complicated, unspecified whether persistent (Primary)  Assessment & Plan Asthma, uncomplicated Asthma is well-controlled with no recent exacerbations, hospitalizations, or need for rescue inhaler use. He reports using Breo daily as instructed, and his symptoms are completely controlled. No wheezing, chest tightness, shortness of breath, or cough reported. He is active and able to perform yard work and other activities without respiratory issues. Discussed the cost of Breo and potential alternatives, but he currently has an adequate supply and insurance coverage. - Refill montelukast  prescription. - Investigate cheaper alternatives to Breo.  Recording duration: 9 minutes  Asthma, uncomplicated Asthma is well-controlled with no recent exacerbations, hospitalizations, or need for rescue inhaler use. He reports using Breo daily as instructed, and his symptoms are completely controlled. No wheezing, chest tightness, shortness of breath, or cough reported. He is active and able to perform yard work and other activities without respiratory issues. Discussed the cost of Breo and potential alternatives, but he currently has an adequate supply and insurance coverage. - Refill montelukast  prescription. - Investigate cheaper alternatives to Breo.  Almarie LELON Ferrari, NP 07/06/2024

## 2024-07-06 NOTE — Patient Instructions (Signed)
  VISIT SUMMARY: You came in today for a follow-up visit regarding your asthma. Your asthma is well-controlled with no recent symptoms or need for your rescue inhaler. You are active and able to perform your daily activities without any respiratory issues. We discussed the cost of your current medication, Breo, and potential alternatives. You also mentioned that you are up to date with your pneumonia vaccine but have not yet received the flu vaccine this year.  YOUR PLAN: -ASTHMA, UNCOMPLICATED: Your asthma is well-controlled with no recent flare-ups or need for your rescue inhaler. Asthma is a condition where your airways narrow and swell, which can make breathing difficult. You are currently using Breo daily, which is effective in managing your symptoms. We will refill your montelukast  prescription, which you take at bedtime. We also discussed looking into cheaper alternatives to Breo, although your current supply and insurance coverage are adequate.  INSTRUCTIONS: Please continue taking your medications as prescribed. We will refill your montelukast  prescription. We will also look into more affordable alternatives to Breo. Additionally, please get your flu vaccine if it is available during your visit or at your earliest convenience.  Follow-up 1 year with [facility] NP or sooner if needed

## 2024-07-06 NOTE — Telephone Encounter (Signed)
 Is there a cheaper alternative to BREO ELLIPTA ?

## 2024-07-07 NOTE — Telephone Encounter (Signed)
 Pt is aware of below message and voiced his understanding. He stated that he would call back when Rx is needed.  Nothing further is needed at this time.

## 2024-07-15 ENCOUNTER — Ambulatory Visit: Admitting: Family Medicine

## 2024-07-15 ENCOUNTER — Encounter: Payer: Self-pay | Admitting: Family Medicine

## 2024-07-15 VITALS — BP 130/38 | HR 57 | Temp 98.1°F | Resp 19 | Ht 65.0 in | Wt 145.0 lb

## 2024-07-15 DIAGNOSIS — I10 Essential (primary) hypertension: Secondary | ICD-10-CM | POA: Diagnosis not present

## 2024-07-15 DIAGNOSIS — Z0000 Encounter for general adult medical examination without abnormal findings: Secondary | ICD-10-CM

## 2024-07-15 LAB — CBC WITH DIFFERENTIAL/PLATELET
Basophils Absolute: 0.1 K/uL (ref 0.0–0.1)
Basophils Relative: 1 % (ref 0.0–3.0)
Eosinophils Absolute: 0.1 K/uL (ref 0.0–0.7)
Eosinophils Relative: 1.9 % (ref 0.0–5.0)
HCT: 38.6 % — ABNORMAL LOW (ref 39.0–52.0)
Hemoglobin: 12.8 g/dL — ABNORMAL LOW (ref 13.0–17.0)
Lymphocytes Relative: 25.1 % (ref 12.0–46.0)
Lymphs Abs: 1.7 K/uL (ref 0.7–4.0)
MCHC: 33.2 g/dL (ref 30.0–36.0)
MCV: 87.4 fl (ref 78.0–100.0)
Monocytes Absolute: 0.8 K/uL (ref 0.1–1.0)
Monocytes Relative: 11.8 % (ref 3.0–12.0)
Neutro Abs: 4 K/uL (ref 1.4–7.7)
Neutrophils Relative %: 60.2 % (ref 43.0–77.0)
Platelets: 162 K/uL (ref 150.0–400.0)
RBC: 4.42 Mil/uL (ref 4.22–5.81)
RDW: 13.9 % (ref 11.5–15.5)
WBC: 6.7 K/uL (ref 4.0–10.5)

## 2024-07-15 LAB — LIPID PANEL
Cholesterol: 113 mg/dL (ref 0–200)
HDL: 32.8 mg/dL — ABNORMAL LOW (ref >39.00)
LDL Cholesterol: 49 mg/dL (ref 0–99)
NonHDL: 80.54
Total CHOL/HDL Ratio: 3
Triglycerides: 157 mg/dL — ABNORMAL HIGH (ref 0.0–149.0)
VLDL: 31.4 mg/dL (ref 0.0–40.0)

## 2024-07-15 LAB — BASIC METABOLIC PANEL WITH GFR
BUN: 27 mg/dL — ABNORMAL HIGH (ref 6–23)
CO2: 31 meq/L (ref 19–32)
Calcium: 9.5 mg/dL (ref 8.4–10.5)
Chloride: 103 meq/L (ref 96–112)
Creatinine, Ser: 1.04 mg/dL (ref 0.40–1.50)
GFR: 64.75 mL/min (ref >60.00)
Glucose, Bld: 108 mg/dL — ABNORMAL HIGH (ref 70–99)
Potassium: 3.6 meq/L (ref 3.5–5.1)
Sodium: 141 meq/L (ref 135–145)

## 2024-07-15 LAB — HEPATIC FUNCTION PANEL
ALT: 17 U/L (ref 0–53)
AST: 19 U/L (ref 0–37)
Albumin: 4 g/dL (ref 3.5–5.2)
Alkaline Phosphatase: 69 U/L (ref 39–117)
Bilirubin, Direct: 0.1 mg/dL (ref 0.0–0.3)
Total Bilirubin: 0.6 mg/dL (ref 0.2–1.2)
Total Protein: 6.7 g/dL (ref 6.0–8.3)

## 2024-07-15 LAB — TSH: TSH: 3.34 u[IU]/mL (ref 0.35–5.50)

## 2024-07-15 NOTE — Patient Instructions (Signed)
 Follow up in 6 months to recheck blood pressure and cholesterol We'll notify you of your lab results and make any changes if needed Keep up the good work!  You look great! Call with any questions or concerns Stay Safe!  Stay Healthy! Have a great weekend!!!

## 2024-07-15 NOTE — Progress Notes (Signed)
   Subjective:    Patient ID: [name], male    DOB: [birth year], 89 y.o.   MRN: [id number]  HPI CPE- UTD on Tdap, PNA, flu.  No concerns today  Patient Care Team    Relationship Specialty Notifications Start End  Mahlon Comer BRAVO, MD PCP - General   10/09/10   Shlomo Wilbert SAUNDERS, MD PCP - Cardiology Cardiology Admissions 04/22/18   Shlomo Wilbert SAUNDERS, MD Consulting Physician Cardiology  09/13/14   Eliza Lonni RAMAN, MD (Inactive) Consulting Physician Vascular Surgery  03/22/15   Nieves Cough, MD Consulting Physician Urology  04/14/18   Avram Lupita BRAVO, MD Consulting Physician Gastroenterology  04/14/18   Brenna Adine CROME, DO Consulting Physician Pulmonary Disease  11/11/19   Trudy Prentice Glatter, MD Consulting Physician Ophthalmology  03/07/20   Nicholaus Sherlean CROME, [facility] (Inactive)  Pharmacist  12/11/21    Comment: [phone]    Health Maintenance  Topic Date Due   Medicare Annual Wellness (AWV)  04/05/2025   DTaP/Tdap/Td (3 - Tdap) 05/30/2029   Pneumococcal Vaccine: 50+ Years  Completed   Influenza Vaccine  Completed   Zoster Vaccines- Shingrix  Completed   HPV VACCINES  Aged Out   Meningococcal B Vaccine  Aged Out   COVID-19 Vaccine  Discontinued      Review of Systems Patient reports no vision/hearing changes, anorexia, fever ,adenopathy, persistant/recurrent hoarseness, swallowing issues, chest pain, palpitations, edema, persistant/recurrent cough, hemoptysis, dyspnea (rest,exertional, paroxysmal nocturnal), gastrointestinal  bleeding (melena, rectal bleeding), abdominal pain, excessive heart burn, GU symptoms (dysuria, hematuria, voiding/incontinence issues) syncope, focal weakness, memory loss, numbness & tingling, skin/hair/nail changes, depression, anxiety, abnormal bruising/bleeding, musculoskeletal symptoms/signs.     Objective:   Physical Exam General Appearance:    Alert, cooperative, no distress, appears stated age  Head:    Normocephalic, without obvious  abnormality, atraumatic  Eyes:    PERRL, conjunctiva/corneas clear, EOM's intact both eyes       Ears:    Normal TM's and external ear canals, both ears  Nose:   Nares normal, septum midline, mucosa normal, no drainage   or sinus tenderness  Throat:   Lips, mucosa, and tongue normal; teeth and gums normal  Neck:   Supple, symmetrical, trachea midline, no adenopathy;       thyroid :  No enlargement/tenderness/nodules  Back:     Symmetric, no curvature, ROM normal, no CVA tenderness  Lungs:     Clear to auscultation bilaterally, respirations unlabored  Chest wall:    No tenderness or deformity  Heart:    Regular rate and rhythm, S1 and S2 normal, no murmur, rub   or gallop  Abdomen:     Soft, non-tender, bowel sounds active all four quadrants,    no masses, no organomegaly  Genitalia:    deferred  Rectal:    Extremities:   Extremities normal, atraumatic, no cyanosis or edema  Pulses:   2+ and symmetric all extremities  Skin:   Skin color, texture, turgor normal, no rashes or lesions  Lymph nodes:   Cervical, supraclavicular, and axillary nodes normal  Neurologic:   CNII-XII intact. Normal strength, sensation and reflexes      throughout          Assessment & Plan:

## 2024-07-15 NOTE — Progress Notes (Signed)
 [facility] [address] [place] [address] Phone: [phone] Subjective:   [name], am serving as a scribe for Dr. Arthea Claudene.  I'm seeing this patient by the request  of:  Mahlon Comer BRAVO, MD  CC: back and hip pain   YEP:Dlagzrupcz  05/23/2024 L gluteal pain  Updated 07/18/2024 Laymond Postle is a 89 y.o. male coming in with complaint of hip and back pain follow-up. Patient states that his R glute pain started to increase recently. No pattern to his pain. No pain in lower back.        Past Medical History:  Diagnosis Date   Allergy    Arthritis    scattered (10/26/2018)   Asthma    C. difficile diarrhea    CAD (coronary artery disease) 2005   s/p PCI of RCA   Carotid stenosis    Carotid dopplers showed 1-39% right and 40-59% left carotid artery stenosis 01/2024   Cataract    NS OD   Cognitive impairment, mild, so stated    Colitis    Erectile dysfunction    GERD (gastroesophageal reflux disease)    Hyperlipidemia    Hypertension    Perforated stomach, acute 10/23/2018   Peripheral vascular disease    Retinal detachment    Vertebrobasilar artery syndrome    Vertigo    Past Surgical History:  Procedure Laterality Date   APPENDECTOMY  1954   CATARACT EXTRACTION Left 08/08/2019   Dr. Camillo   COLONOSCOPY     ESOPHAGOGASTRODUODENOSCOPY (EGD) WITH ESOPHAGEAL DILATION  09/2018   EYE SURGERY     LAPAROTOMY N/A 10/23/2018   Procedure: EXPLORATORY LAPAROTOMY WITH GRAHAM PATCH FOR PERFORATED ULCER AND PLACEMENT OF GASTRIC TUBE;  Surgeon: Ebbie Cough, MD;  Location: [facility] OR;  Service: General;  Laterality: N/A;   NASAL SINUS SURGERY  05/2018   RETINAL DETACHMENT SURGERY     ? side   UPPER GASTROINTESTINAL ENDOSCOPY     Social History   Socioeconomic History   Marital status: Married    Spouse name: Not on file   Number of children: 3   Years of education: Not on file   Highest education level: Master's  degree (e.g., MA, MS, MEng, MEd, MSW, MBA)  Occupational History   Occupation: Retired Teaching laboratory technician: RETIRED  Tobacco Use   Smoking status: Former    Current packs/day: 0.00    Types: Cigars, Cigarettes    Quit date: 05/13/1961    Years since quitting: 63.2    Passive exposure: Never   Smokeless tobacco: Never   Tobacco comments:    Smokes cigars 65 years ago. no inhaling.  Vaping Use   Vaping status: Never Used  Substance and Sexual Activity   Alcohol use: Not Currently    Alcohol/week: 1.0 standard drink of alcohol    Types: 1 Glasses of wine per week   Drug use: Never   Sexual activity: Not Currently  Other Topics Concern   Not on file  Social History Narrative   Married with 1 son and 2 daughters.  Retired.   2 caffeinated beverages daily, 3 glasses of wine daily   Never smoker no drug use no tobacco   Social Drivers of Corporate investment banker Strain: Low Risk  (05/31/2024)   Overall Financial Resource Strain (CARDIA)    Difficulty of Paying Living Expenses: Not hard at all  Food Insecurity: No Food Insecurity (05/31/2024)   Hunger  Vital Sign    Worried About Programme researcher, broadcasting/film/video in the Last Year: Never true    Ran Out of Food in the Last Year: Never true  Transportation Needs: No Transportation Needs (05/31/2024)   PRAPARE - Administrator, Civil Service (Medical): No    Lack of Transportation (Non-Medical): No  Physical Activity: Insufficiently Active (05/31/2024)   Exercise Vital Sign    Days of Exercise per Week: 1 day    Minutes of Exercise per Session: 10 min  Stress: No Stress Concern Present (05/31/2024)   [employer] of Occupational Health - Occupational Stress Questionnaire    Feeling of Stress: Not at all  Social Connections: Moderately Isolated (05/31/2024)   Social Connection and Isolation Panel    Frequency of Communication with Friends and Family: More than three times a week    Frequency of Social Gatherings with  Friends and Family: Twice a week    Attends Religious Services: Patient declined    Database administrator or Organizations: No    Attends Engineer, structural: Not on file    Marital Status: Married   Allergies  Allergen Reactions   Neosporin [Bacitracin-Polymyxin B] Other (See Comments)    Redness   Family History  Problem Relation Age of Onset   Stroke Mother    Hyperlipidemia Mother    Hypertension Mother    Deep vein thrombosis Mother    Stroke Father    Hyperlipidemia Father    Hypertension Father    Deep vein thrombosis Father    Diverticulitis Father    Ulcerative colitis Father    Colon polyps Father    Colon cancer Neg Hx    Esophageal cancer Neg Hx    Stomach cancer Neg Hx    Rectal cancer Neg Hx      Current Outpatient Medications (Cardiovascular):    amLODipine  (NORVASC ) 5 MG tablet, Take 1 tablet (5 mg total) by mouth daily.   atorvastatin  (LIPITOR ) 80 MG tablet, TAKE 1 TABLET(80 MG) BY MOUTH DAILY   carvedilol  (COREG ) 6.25 MG tablet, Take 1 tablet (6.25 mg total) by mouth 2 (two) times daily with a meal.   hydrochlorothiazide  (HYDRODIURIL ) 25 MG tablet, TAKE 1 TABLET(25 MG) BY MOUTH DAILY  Current Outpatient Medications (Respiratory):    albuterol  (PROVENTIL ) (2.5 MG/3ML) 0.083% nebulizer solution, Take 3 mLs (2.5 mg total) by nebulization every 6 (six) hours as needed for wheezing or shortness of breath.   albuterol  (VENTOLIN  HFA) 108 (90 Base) MCG/ACT inhaler, Inhale 1-2 puffs into the lungs every 4 (four) hours as needed for wheezing or shortness of breath. Patient need to keep office visit to get more refill's .   azelastine  (ASTELIN ) 0.1 % nasal spray, USE 1 SPRAY IN EACH NOSTRIL TWICE DAILY   fluticasone  (FLONASE ) 50 MCG/ACT nasal spray, 1 spray each nostril twice daily   fluticasone  furoate-vilanterol (BREO ELLIPTA ) 100-25 MCG/ACT AEPB, Inhale 1 puff into the lungs daily.   montelukast  (SINGULAIR ) 10 MG tablet, Take 1 tablet (10 mg total) by  mouth at bedtime.  Current Outpatient Medications (Analgesics):    acetaminophen  (TYLENOL ) 500 MG tablet, Take 500 mg by mouth 2 (two) times daily.   aspirin  EC 81 MG tablet, Take 81 mg by mouth daily.   Current Outpatient Medications (Other):    cholecalciferol (VITAMIN D3) 25 MCG (1000 UT) tablet, Take 1,000 Units by mouth daily.   donepezil  (ARICEPT ) 10 MG tablet, TAKE 1 TABLET BY MOUTH DAILY   doxycycline  (ADOXA)  100 MG tablet, Take 100 mg by mouth 2 (two) times daily.   Eyelid Cleansers (OCUSOFT EYELID CLEANSING) PADS, Place 1 application into both eyes 2 (two) times daily.   fish oil-omega-3 fatty acids  1000 MG capsule, Take 2 capsules (2 g total) by mouth 2 (two) times daily.   gabapentin  (NEURONTIN ) 300 MG capsule, TAKE 1 CAPSULE(300 MG) BY MOUTH THREE TIMES DAILY   Polyvinyl Alcohol-Povidone (REFRESH OP), Place 1 drop into both eyes 2 (two) times daily.   Saw Palmetto 500 MG CAPS, Take 1,000 mg by mouth 2 (two) times daily.    Reviewed prior external information including notes and imaging from  primary care provider As well as notes that were available from care everywhere and other healthcare systems.  Past medical history, social, surgical and family history all reviewed in electronic medical record.  No pertanent information unless stated regarding to the chief complaint.   Review of Systems:  No headache, visual changes, nausea, vomiting, diarrhea, constipation, dizziness, abdominal pain, skin rash, fevers, chills, night sweats, weight loss, swollen lymph nodes, body aches, joint swelling, chest pain, shortness of breath, mood changes. POSITIVE muscle aches  Objective  Blood pressure (!) 142/68, pulse 63, height 5' 5 (1.651 m), weight 143 lb (64.9 kg), SpO2 96%.   General: No apparent distress alert and oriented x3 mood and affect normal, dressed appropriately.  HEENT: Pupils equal, extraocular movements intact  Respiratory: Patient's speak in full sentences and does  not appear short of breath  Cardiovascular: No lower extremity edema, non tender, no erythema  Hip exam shows severe tenderness to palpation mostly over the piriformis on the right side.  Does have tightness with straight leg test but no true radicular symptoms.  Severe loss of lordosis and some atrophy of the gluteal area.   After verbal consent patient was prepped with alcohol swab and with a 21-gauge 2 inch needle injected into the piriformis tendon more laterally.  Total of 2 cc of 0.5% Marcaine and 1 cc of Kenalog  40 mg/mL used.  No blood loss.  Band-Aid placed.  Postinjection instruction given.   Impression and Recommendations:    The above documentation has been reviewed and is accurate and complete Allysa Governale M Celeste Tavenner, DO

## 2024-07-15 NOTE — Assessment & Plan Note (Signed)
 Pt's PE WNL and he is doing exceptionally well for his age.  Check labs.  Anticipatory guidance provided.

## 2024-07-18 ENCOUNTER — Encounter: Payer: Self-pay | Admitting: Family Medicine

## 2024-07-18 ENCOUNTER — Ambulatory Visit: Payer: Self-pay | Admitting: Family Medicine

## 2024-07-18 ENCOUNTER — Ambulatory Visit: Admitting: Family Medicine

## 2024-07-18 VITALS — BP 142/68 | HR 63 | Ht 65.0 in | Wt 143.0 lb

## 2024-07-18 DIAGNOSIS — G5701 Lesion of sciatic nerve, right lower limb: Secondary | ICD-10-CM

## 2024-07-18 NOTE — Progress Notes (Signed)
 Patient read message and reviewed labs

## 2024-07-18 NOTE — Patient Instructions (Addendum)
 Injected R piriformis today See me in 10-12 weeks

## 2024-07-18 NOTE — Assessment & Plan Note (Signed)
 Has been quite sometime.  Discussed the possibility of this but also being referred pain to from the back.  Discussed icing regimen and home exercises, discussed with patient about the possibility of another epidural if necessary.  No change in medications.  Continue to work on core strength.  Follow-up again in 6 to 8 weeks

## 2024-07-19 ENCOUNTER — Other Ambulatory Visit: Payer: Self-pay | Admitting: Cardiology

## 2024-07-19 ENCOUNTER — Other Ambulatory Visit: Payer: Self-pay | Admitting: Family Medicine

## 2024-07-19 DIAGNOSIS — G3184 Mild cognitive impairment, so stated: Secondary | ICD-10-CM

## 2024-08-09 NOTE — Progress Notes (Signed)
 " Cardiology Office Note   Date:  08/15/2024  ID:  [name], DOB [birth year], MRN [id number] PCP: Mahlon Comer BRAVO, MD  [facility] Providers Cardiologist:  Wilbert Bihari, MD     History of Present Illness [name] is a 89 y.o. male with a past medical history of CAD, s/p PCI to RCA 2005, PVD, hypertension, obstructive cardiomyopathy, dyslipidemia, carotid artery stenosis  01/27/2024 carotid duplex mild stenosis on right, moderate on left 07/28/2023 cardiac MRI study consistent with hypertrophic obstructive cardiomyopathy with resultant moderate to severe MR 06/25/2023 ETT no exercise-induced VT or hypertension, no evidence of ischemia 05/21/2023 echo EF 65 to 70%, grade 1 DD, There is an outflow tract gradient. Peak velocity 2.45  m/s. Peak gradient 24 mmHg. With Valsalva this increases to 3.3 m/s and 42 mmHg. 2005 PCI of the RCA  He has a longstanding patient of Dr. Bihari initially establishing with her in 2005 during hospital admission, formally established in 2014 for the evaluation and management of CAD.  Evaluated by Dr. Bihari on 04/23/2023, was doing well, murmur was noted, repeat echo was arranged revealing normal EF, grade 1 DD, with concerns for HOCM and he was referred to Dr. Santo.  Evaluated by Dr. Santo on 06/17/2023 overall was asymptomatic, ETT was arranged revealing no evidence of ischemia, cardiac MRI was arranged was consistent with hypertrophic obstructive cardiomyopathy with resultant moderate to severe MR.   He presents today for follow-up, has been doing well since he was last evaluated in our office, no formal complaints.  He stays very physically active.  He mentions he goes up and down his steps approximately 20 times a day but has to care for his wife but also for exercise.  He follows closely with his PCP.  We discussed his diagnosis of obstructive cardiomyopathy--says he does not really recall much of that but he is feeling well.  He denies  chest pain, palpitations, dyspnea, pnd, orthopnea, n, v, dizziness, syncope, edema, weight gain, or early satiety.    ROS: Review of Systems  All other systems reviewed and are negative.    Studies Reviewed EKG Interpretation Date/Time:  Monday August 15 2024 10:41:12 EDT Ventricular Rate:  54 PR Interval:  232 QRS Duration:  90 QT Interval:  422 QTC Calculation: 400 R Axis:   -13  Text Interpretation: Sinus bradycardia with 1st degree A-V block When compared with ECG of 23-Apr-2023 14:07, No significant change was found Confirmed by Carlin Nest [phone]) on 08/15/2024 10:48:15 AM    Cardiac Studies & Procedures   ______________________________________________________________________________________________   STRESS TESTS  EXERCISE TOLERANCE TEST (ETT) 06/25/2023  Interpretation Summary   No ST deviation was noted.   Patient exercised for 4 minutes and 55 seconds achieving target heart rate and 6.1 METS of activity.  No anginal symptoms.   Overall low risk exercise treadmill test with no electrocardiographic evidence of ischemia.  Decreased exercise tolerance.   ECHOCARDIOGRAM  ECHOCARDIOGRAM COMPLETE 05/21/2023  Narrative ECHOCARDIOGRAM REPORT    Patient Name:   [name]   Date of Exam: 05/21/2023 Medical Rec #:  [id number]     Height:       65.0 in Accession #:    [id number]    Weight:       142.4 lb Date of Birth:  1934-11-13     BSA:          1.712 m Patient Age:    87 years      BP:  157/66 mmHg Patient Gender: M             HR:           61 bpm. Exam Location:  [address]  Procedure: 2D Echo, 3D Echo, Cardiac Doppler and Color Doppler  Indications:    R01.1 Murmur  History:        Patient has prior history of Echocardiogram examinations, most recent 11/12/2014. CAD, Carotid Disease and PVD; Risk Factors:Hypertension and HLD.  Sonographer:    Waldo Guadalajara RCS Referring Phys: [phone] TRACI R TURNER  IMPRESSIONS   1. There is an outflow  tract gradient. Peak velocity 2.45 m/s. Peak gradient 24 mmHg. With Valsalva this increases to 3.3 m/s and 42 mmHg respectively. Left ventricular ejection fraction, by estimation, is 65 to 70%. The left ventricle has normal function. The left ventricle has no regional wall motion abnormalities. Left ventricular diastolic parameters are consistent with Grade I diastolic dysfunction (impaired relaxation). 2. Right ventricular systolic function is normal. The right ventricular size is normal. There is normal pulmonary artery systolic pressure. 3. Left atrial size was mildly dilated. 4. The mitral valve is normal in structure. Trivial mitral valve regurgitation. No evidence of mitral stenosis. 5. The aortic valve is tricuspid. Aortic valve regurgitation is not visualized. No aortic stenosis is present. 6. The inferior vena cava is normal in size with greater than 50% respiratory variability, suggesting right atrial pressure of 3 mmHg.  FINDINGS Left Ventricle: There is an outflow tract gradient. Peak velocity 2.45 m/s. Peak gradient 24 mmHg. With Valsalva this increases to 3.3 m/s and 42 mmHg respectively. Left ventricular ejection fraction, by estimation, is 65 to 70%. The left ventricle has normal function. The left ventricle has no regional wall motion abnormalities. The left ventricular internal cavity size was normal in size. There is no left ventricular hypertrophy. Left ventricular diastolic parameters are consistent with Grade I diastolic dysfunction (impaired relaxation). Indeterminate filling pressures.  Right Ventricle: The right ventricular size is normal. No increase in right ventricular wall thickness. Right ventricular systolic function is normal. There is normal pulmonary artery systolic pressure. The tricuspid regurgitant velocity is 2.01 m/s, and with an assumed right atrial pressure of 3 mmHg, the estimated right ventricular systolic pressure is 19.2 mmHg.  Left Atrium: Left atrial  size was mildly dilated.  Right Atrium: Right atrial size was normal in size.  Pericardium: There is no evidence of pericardial effusion.  Mitral Valve: The mitral valve is normal in structure. Trivial mitral valve regurgitation. No evidence of mitral valve stenosis.  Tricuspid Valve: The tricuspid valve is normal in structure. Tricuspid valve regurgitation is trivial. No evidence of tricuspid stenosis.  Aortic Valve: The aortic valve is tricuspid. Aortic valve regurgitation is not visualized. No aortic stenosis is present.  Pulmonic Valve: The pulmonic valve was normal in structure. Pulmonic valve regurgitation is not visualized. No evidence of pulmonic stenosis.  Aorta: The aortic root is normal in size and structure.  Venous: The inferior vena cava is normal in size with greater than 50% respiratory variability, suggesting right atrial pressure of 3 mmHg.  IAS/Shunts: No atrial level shunt detected by color flow Doppler.   LEFT VENTRICLE PLAX 2D LVIDd:         3.30 cm   Diastology LVIDs:         2.20 cm   LV e' medial:    5.55 cm/s LV PW:         0.90 cm   LV E/e' medial:  13.9 LV IVS:        1.00 cm   LV e' lateral:   8.27 cm/s LVOT diam:     2.00 cm   LV E/e' lateral: 9.3 LV SV:         143 LV SV Index:   83 LVOT Area:     3.14 cm  3D Volume EF: 3D EF:        64 % LV EDV:       100 ml LV ESV:       36 ml LV SV:        64 ml  RIGHT VENTRICLE RV S prime:     17.20 cm/s TAPSE (M-mode): 1.9 cm RVSP:           19.2 mmHg  LEFT ATRIUM             Index        RIGHT ATRIUM           Index LA diam:        3.90 cm 2.28 cm/m   RA Pressure: 3.00 mmHg LA Vol (A2C):   55.9 ml 32.65 ml/m  RA Area:     13.70 cm LA Vol (A4C):   32.2 ml 18.81 ml/m  RA Volume:   31.30 ml  18.28 ml/m LA Biplane Vol: 46.2 ml 26.98 ml/m AORTIC VALVE LVOT Vmax:   204.00 cm/s LVOT Vmean:  143.000 cm/s LVOT VTI:    0.454 m  AORTA Ao Root diam: 3.40 cm Ao Asc diam:  3.30 cm  MITRAL VALVE                 TRICUSPID VALVE MV Area (PHT):              TR Peak grad:   16.2 mmHg MV Decel Time:              TR Vmax:        201.00 cm/s MR Peak grad: 127.7 mmHg    Estimated RAP:  3.00 mmHg MR Mean grad: 87.0 mmHg     RVSP:           19.2 mmHg MR Vmax:      565.00 cm/s MR Vmean:     449.0 cm/s    SHUNTS MV E velocity: 77.00 cm/s   Systemic VTI:  0.45 m MV A velocity: 107.00 cm/s  Systemic Diam: 2.00 cm MV E/A ratio:  0.72  Annabella Scarce MD Electronically signed by Annabella Scarce MD Signature Date/Time: 05/21/2023/4:34:28 PM    Final        CARDIAC MRI  MR CARDIAC MORPHOLOGY W WO CONTRAST 07/28/2023  Narrative CLINICAL DATA:  Clinical question of hypertrophy cardiomyopathy Study assumes HCT of 40 and BSA of 1.73 m2.  EXAM: CARDIAC MRI  TECHNIQUE: The patient was scanned on a 1.5 Tesla GE magnet. A dedicated cardiac coil was used. Functional imaging was done using Fiesta sequences. 2,3, and 4 chamber views were done to assess for RWMA's. Modified Simpson's rule using a short axis stack was used to calculate an ejection fraction on a dedicated work Research [employer], [employer]. The patient received 7 cc of Gadavist . After 10 minutes inversion recovery sequences were used to assess for infiltration and scar tissue. Flow quantification was performed 2 times during this examination with flow quantification performed at the levels of the ascending aorta above the valve, pulmonary artery above the valve.  CONTRAST:  7 cc  of Gadavist   FINDINGS: 1.  Normal left ventricular size, with LVEDD 42 mm, and LVEDVi 62 mL/m2.  Asymmetric septal hypertrophy, with intraventricular septal thickness of 15 mm, posterior wall thickness of 7 mm, and myocardial mass index of 58 g/m2.  Normal left ventricular systolic function (LVEF =70%). There is systolic anterior motion of the mitral valve. Using peak velocity or left sided velocity encoding, at best estimate, LVOT gradient is  41 mm Hg.  Left ventricular parametric mapping notable for normal T2 and ECV.  There is no late gadolinium enhancement in the left ventricular myocardium.  2. Normal right ventricular size with RVEDVI 51 mL/m2.  Normal right ventricular thickness.  Normal right ventricular systolic function (RVEF =56%). There are no regional wall motion abnormalities or aneurysms.  3.  Normal left and right atrial size.  Cannot exclude small PFO.  4. Normal size of the aortic root, ascending aorta and pulmonary artery.  5. Valve assessment:  Aortic Valve: Valve not well visualized. No significant regurgitation.  Pulmonic Valve: No significant regurgitation.  Tricuspid Valve: Mild regurgitation.  Regurgitant fraction 6%.  Mitral Valve:By velocity encoding assessment, regurgitant fraction 30%. Regurgitant volume 28 mL. By LV/RV volumes regurgitant volume is 43 mL, fraction is 46%. Moderate to severe mitral regurgitation.  6.  Normal pericardium.  No pericardial effusion.  7. Grossly, no extracardiac findings. Recommended dedicated study if concerned for non-cardiac pathology.  IMPRESSION: 1. Study is consistent with hypertrophic obstructive cardiomyopathy. No high risk findings (no aneurysm, septal thickness < 30 mm, no LGE)  2. Moderate to severe mitral regurgitation related to hypertrophic cardiomyopathy.  Stanly Leavens MD   Electronically Signed By: Stanly Leavens M.D. On: 07/28/2023 17:33   ______________________________________________________________________________________________      Risk Assessment/Calculations           Physical Exam VS:  BP (!) 116/54 (BP Location: Right Arm, Patient Position: Sitting, Cuff Size: Normal)   Pulse (!) 54   Ht 5' 5 (1.651 m)   Wt 143 lb (64.9 kg)   SpO2 97%   BMI 23.80 kg/m        Wt Readings from Last 3 Encounters:  08/15/24 143 lb (64.9 kg)  07/18/24 143 lb (64.9 kg)  07/15/24 145 lb (65.8 kg)    GEN:  Appears younger than stated age, well nourished, well developed in no acute distress NECK: No JVD; No carotid bruits CARDIAC: RRR, 3/6 systolic murmur best heard left sternal border, no rubs, no gallops RESPIRATORY:  Clear to auscultation without rales, wheezing or rhonchi  ABDOMEN: Soft, non-tender, non-distended EXTREMITIES:  No edema; No deformity   ASSESSMENT AND PLAN CAD- Stable with no anginal symptoms. No indication for ischemic evaluation.  Continue aspirin  81 mg daily, Lipitor  80 mg daily.  Carotid artery stenosis-no bruits appreciated, repeat carotid duplex in April 2026 for surveillance.  He is on aspirin  and Lipitor .  Obstructive cardiomyopathy-asymptomatic.  Dyslipidemia-formally followed by PCP, on Lipitor  80 mg daily, prefer his LDL to be less than 70.  Hypertension-blood pressure is well-controlled 116/54, continue HCTZ 25 mg daily, Coreg  6.25 mg twice daily, Norvasc  5 mg daily.  Mitral regurgitation-moderate to severe per cardiac MRI, overall asymptomatic, resultant from obstructive cardiomyopathy.       Dispo: Carotid duplex in April 2026, follow-up in 1 year per his request.  Signed, Delon JAYSON Hoover, NP  "

## 2024-08-10 ENCOUNTER — Other Ambulatory Visit: Payer: Self-pay | Admitting: Internal Medicine

## 2024-08-10 DIAGNOSIS — E7800 Pure hypercholesterolemia, unspecified: Secondary | ICD-10-CM

## 2024-08-10 DIAGNOSIS — I6523 Occlusion and stenosis of bilateral carotid arteries: Secondary | ICD-10-CM

## 2024-08-10 DIAGNOSIS — I2510 Atherosclerotic heart disease of native coronary artery without angina pectoris: Secondary | ICD-10-CM

## 2024-08-10 DIAGNOSIS — I10 Essential (primary) hypertension: Secondary | ICD-10-CM

## 2024-08-12 ENCOUNTER — Other Ambulatory Visit: Payer: Self-pay | Admitting: Cardiology

## 2024-08-12 DIAGNOSIS — I6523 Occlusion and stenosis of bilateral carotid arteries: Secondary | ICD-10-CM

## 2024-08-12 DIAGNOSIS — I10 Essential (primary) hypertension: Secondary | ICD-10-CM

## 2024-08-12 DIAGNOSIS — I2510 Atherosclerotic heart disease of native coronary artery without angina pectoris: Secondary | ICD-10-CM

## 2024-08-12 DIAGNOSIS — E7800 Pure hypercholesterolemia, unspecified: Secondary | ICD-10-CM

## 2024-08-15 ENCOUNTER — Ambulatory Visit: Admitting: Cardiology

## 2024-08-15 ENCOUNTER — Encounter: Payer: Self-pay | Admitting: Cardiology

## 2024-08-15 ENCOUNTER — Ambulatory Visit: Attending: Cardiology | Admitting: Cardiology

## 2024-08-15 VITALS — BP 116/54 | HR 54 | Ht 65.0 in | Wt 143.0 lb

## 2024-08-15 DIAGNOSIS — E782 Mixed hyperlipidemia: Secondary | ICD-10-CM

## 2024-08-15 DIAGNOSIS — I6523 Occlusion and stenosis of bilateral carotid arteries: Secondary | ICD-10-CM | POA: Diagnosis not present

## 2024-08-15 DIAGNOSIS — I2510 Atherosclerotic heart disease of native coronary artery without angina pectoris: Secondary | ICD-10-CM

## 2024-08-15 DIAGNOSIS — I421 Obstructive hypertrophic cardiomyopathy: Secondary | ICD-10-CM

## 2024-08-15 DIAGNOSIS — I340 Nonrheumatic mitral (valve) insufficiency: Secondary | ICD-10-CM

## 2024-08-15 DIAGNOSIS — I10 Essential (primary) hypertension: Secondary | ICD-10-CM

## 2024-08-15 NOTE — Patient Instructions (Addendum)
 Medication Instructions:   Your physician recommends that you continue on your current medications as directed. Please refer to the Current Medication list given to you today.  *If you need a refill on your cardiac medications before your next appointment, please call your pharmacy*   Lab Work:  NONE ORDERED  TODAY    If you have labs (blood work) drawn today and your tests are completely normal, you will receive your results only by: MyChart Message (if you have MyChart) OR A paper copy in the mail If you have any lab test that is abnormal or we need to change your treatment, we will call you to review the results.  Testing/Procedures: in April of 2026. Your physician has requested that you have a carotid duplex. This test is an ultrasound of the carotid arteries in your neck. It looks at blood flow through these arteries that supply the brain with blood. Allow one hour for this exam. There are no restrictions or special instructions.   Follow-Up: At [facility], you and your health needs are our priority.  As part of our continuing mission to provide you with exceptional heart care, our providers are all part of one team.  This team includes your primary Cardiologist (physician) and Advanced Practice Providers or APPs (Physician Assistants and Nurse Practitioners) who all work together to provide you with the care you need, when you need it.  Your next appointment:   1 year(s)  Provider:   Wilbert Bihari, MD    We recommend signing up for the patient portal called MyChart.  Sign up information is provided on this After Visit Summary.  MyChart is used to connect with patients for Virtual Visits (Telemedicine).  Patients are able to view lab/test results, encounter notes, upcoming appointments, etc.  Non-urgent messages can be sent to your provider as well.   To learn more about what you can do with MyChart, go to [email or web address].   Other Instructions

## 2024-08-25 NOTE — Progress Notes (Signed)
 [name] [address] [place] [address] Phone: [phone] Subjective:   ISusannah [name], am serving as a scribe for Dr. Arthea Claudene.  I'm seeing this patient by the request  of:  Mahlon Comer BRAVO, MD  CC: Right hip pain but now bilateral leg pain  YEP:Dlagzrupcz  07/18/2024 Has been quite sometime.  Discussed the possibility of this but also being referred pain to from the back.  Discussed icing regimen and home exercises, discussed with patient about the possibility of another epidural if necessary.  No change in medications.  Continue to work on core strength.  Follow-up again in 6 to 8 weeks     Update 08/29/2024 [name] is a 89 y.o. male coming in with complaint of R hip pain. Given piriformis injection at last exam. Patient states feeling good. Last injection helped. Four days ago was carry bags and fell coming down the steps. Hamstrings hurting in both legs. Not as bad as the day it happened. Going down the stairs hurts the most.      Past Medical History:  Diagnosis Date   Allergy    Arthritis    scattered (10/26/2018)   Asthma    C. difficile diarrhea    CAD (coronary artery disease) 2005   s/p PCI of RCA   Carotid stenosis    Carotid dopplers showed 1-39% right and 40-59% left carotid artery stenosis 01/2024   Cataract    NS OD   Cognitive impairment, mild, so stated    Colitis    Erectile dysfunction    GERD (gastroesophageal reflux disease)    Hyperlipidemia    Hypertension    Perforated stomach, acute 10/23/2018   Peripheral vascular disease    Retinal detachment    Vertebrobasilar artery syndrome    Vertigo    Past Surgical History:  Procedure Laterality Date   APPENDECTOMY  1954   CATARACT EXTRACTION Left 08/08/2019   Dr. Camillo   COLONOSCOPY     ESOPHAGOGASTRODUODENOSCOPY (EGD) WITH ESOPHAGEAL DILATION  09/2018   EYE SURGERY     LAPAROTOMY N/A 10/23/2018   Procedure: EXPLORATORY LAPAROTOMY WITH  GRAHAM PATCH FOR PERFORATED ULCER AND PLACEMENT OF GASTRIC TUBE;  Surgeon: Ebbie Cough, MD;  Location: [facility] OR;  Service: General;  Laterality: N/A;   NASAL SINUS SURGERY  05/2018   RETINAL DETACHMENT SURGERY     ? side   UPPER GASTROINTESTINAL ENDOSCOPY     Social History   Socioeconomic History   Marital status: Married    Spouse name: Not on file   Number of children: 3   Years of education: Not on file   Highest education level: Master's degree (e.g., MA, MS, MEng, MEd, MSW, MBA)  Occupational History   Occupation: Retired Teaching Laboratory Technician: RETIRED  Tobacco Use   Smoking status: Former    Current packs/day: 0.00    Types: Cigars, Cigarettes    Quit date: 05/13/1961    Years since quitting: 63.3    Passive exposure: Never   Smokeless tobacco: Never   Tobacco comments:    Smokes cigars 65 years ago. no inhaling.  Vaping Use   Vaping status: Never Used  Substance and Sexual Activity   Alcohol use: Not Currently    Alcohol/week: 1.0 standard drink of alcohol    Types: 1 Glasses of wine per week   Drug use: Never   Sexual activity: Not Currently  Other Topics Concern   Not on  file  Social History Narrative   Married with 1 son and 2 daughters.  Retired.   2 caffeinated beverages daily, 3 glasses of wine daily   Never smoker no drug use no tobacco   Social Drivers of Corporate Investment Banker Strain: Low Risk  (05/31/2024)   Overall Financial Resource Strain (CARDIA)    Difficulty of Paying Living Expenses: Not hard at all  Food Insecurity: No Food Insecurity (05/31/2024)   Hunger Vital Sign    Worried About Running Out of Food in the Last Year: Never true    Ran Out of Food in the Last Year: Never true  Transportation Needs: No Transportation Needs (05/31/2024)   PRAPARE - Administrator, Civil Service (Medical): No    Lack of Transportation (Non-Medical): No  Physical Activity: Insufficiently Active (05/31/2024)   Exercise Vital Sign     Days of Exercise per Week: 1 day    Minutes of Exercise per Session: 10 min  Stress: No Stress Concern Present (05/31/2024)   [employer] of Occupational Health - Occupational Stress Questionnaire    Feeling of Stress: Not at all  Social Connections: Moderately Isolated (05/31/2024)   Social Connection and Isolation Panel    Frequency of Communication with Friends and Family: More than three times a week    Frequency of Social Gatherings with Friends and Family: Twice a week    Attends Religious Services: Patient declined    Database Administrator or Organizations: No    Attends Engineer, Structural: Not on file    Marital Status: Married   Allergies  Allergen Reactions   Neosporin [Bacitracin-Polymyxin B] Other (See Comments)    Redness   Family History  Problem Relation Age of Onset   Stroke Mother    Hyperlipidemia Mother    Hypertension Mother    Deep vein thrombosis Mother    Stroke Father    Hyperlipidemia Father    Hypertension Father    Deep vein thrombosis Father    Diverticulitis Father    Ulcerative colitis Father    Colon polyps Father    Colon cancer Neg Hx    Esophageal cancer Neg Hx    Stomach cancer Neg Hx    Rectal cancer Neg Hx      Current Outpatient Medications (Cardiovascular):    amLODipine  (NORVASC ) 5 MG tablet, TAKE 1 TABLET(5 MG) BY MOUTH DAILY   atorvastatin  (LIPITOR ) 80 MG tablet, TAKE 1 TABLET(80 MG) BY MOUTH DAILY   carvedilol  (COREG ) 6.25 MG tablet, Take 1 tablet (6.25 mg total) by mouth 2 (two) times daily with a meal.   hydrochlorothiazide  (HYDRODIURIL ) 25 MG tablet, TAKE 1 TABLET(25 MG) BY MOUTH DAILY  Current Outpatient Medications (Respiratory):    albuterol  (PROVENTIL ) (2.5 MG/3ML) 0.083% nebulizer solution, Take 3 mLs (2.5 mg total) by nebulization every 6 (six) hours as needed for wheezing or shortness of breath.   albuterol  (VENTOLIN  HFA) 108 (90 Base) MCG/ACT inhaler, Inhale 1-2 puffs into the lungs every 4 (four)  hours as needed for wheezing or shortness of breath. Patient need to keep office visit to get more refill's .   azelastine  (ASTELIN ) 0.1 % nasal spray, USE 1 SPRAY IN EACH NOSTRIL TWICE DAILY   fluticasone  (FLONASE ) 50 MCG/ACT nasal spray, 1 spray each nostril twice daily   fluticasone  furoate-vilanterol (BREO ELLIPTA ) 100-25 MCG/ACT AEPB, Inhale 1 puff into the lungs daily.   montelukast  (SINGULAIR ) 10 MG tablet, Take 1 tablet (10 mg total) by  mouth at bedtime.  Current Outpatient Medications (Analgesics):    acetaminophen  (TYLENOL ) 500 MG tablet, Take 500 mg by mouth 2 (two) times daily.   aspirin  EC 81 MG tablet, Take 81 mg by mouth daily.   Current Outpatient Medications (Other):    cholecalciferol (VITAMIN D3) 25 MCG (1000 UT) tablet, Take 1,000 Units by mouth daily.   donepezil  (ARICEPT ) 10 MG tablet, TAKE 1 TABLET BY MOUTH DAILY   doxycycline  (ADOXA) 100 MG tablet, Take 100 mg by mouth 2 (two) times daily.   Eyelid Cleansers (OCUSOFT EYELID CLEANSING) PADS, Place 1 application into both eyes 2 (two) times daily.   fish oil-omega-3 fatty acids  1000 MG capsule, Take 2 capsules (2 g total) by mouth 2 (two) times daily.   gabapentin  (NEURONTIN ) 300 MG capsule, TAKE 1 CAPSULE(300 MG) BY MOUTH THREE TIMES DAILY   Polyvinyl Alcohol-Povidone (REFRESH OP), Place 1 drop into both eyes 2 (two) times daily.   Saw Palmetto 500 MG CAPS, Take 1,000 mg by mouth 2 (two) times daily.    Reviewed prior external information including notes and imaging from  primary care provider As well as notes that were available from care everywhere and other healthcare systems.  Past medical history, social, surgical and family history all reviewed in electronic medical record.  No pertanent information unless stated regarding to the chief complaint.   Review of Systems:  No headache, visual changes, nausea, vomiting, diarrhea, constipation, dizziness, abdominal pain, skin rash, fevers, chills, night sweats,  weight loss, swollen lymph nodes, body aches, joint swelling, chest pain, shortness of breath, mood changes. POSITIVE muscle aches  Objective  Blood pressure 128/70, pulse 60, height 5' 5 (1.651 m), weight 148 lb (67.1 kg), SpO2 95%.   General: No apparent distress alert and oriented x3 mood and affect normal, dressed appropriately.  HEENT: Pupils equal, extraocular movements intact  Respiratory: Patient's speak in full sentences and does not appear short of breath  Cardiovascular: No lower extremity edema, non tender, no erythema  Varus deformity of the knee is noted.  Patient does have some limited range of motion noted.  Seems to be more with flexion noted.  No and limited extension noted.  Patient does have some mild instability noted of the knees.  Hamstrings are moderately tender distally.  Proximally patient's hamstrings are better but still has atrophy noted of the gluteal area.    Impression and Recommendations:     The above documentation has been reviewed and is accurate and complete Tashan Kreitzer M Jessey Stehlin, DO

## 2024-08-29 ENCOUNTER — Ambulatory Visit: Admitting: Family Medicine

## 2024-08-29 ENCOUNTER — Ambulatory Visit

## 2024-08-29 VITALS — BP 128/70 | HR 60 | Ht 65.0 in | Wt 148.0 lb

## 2024-08-29 DIAGNOSIS — M25562 Pain in left knee: Secondary | ICD-10-CM | POA: Diagnosis not present

## 2024-08-29 DIAGNOSIS — M48062 Spinal stenosis, lumbar region with neurogenic claudication: Secondary | ICD-10-CM | POA: Diagnosis not present

## 2024-08-29 DIAGNOSIS — M25561 Pain in right knee: Secondary | ICD-10-CM

## 2024-08-29 NOTE — Patient Instructions (Signed)
 Xray today Do prescribed exercises at least 3x a week Keep being active See you again in 3 months

## 2024-08-29 NOTE — Assessment & Plan Note (Signed)
 Known degenerative disc disease.  Known icing regimen, home exercises, which activities to do and which ones to avoid.  Patient is having more distal hamstring pathology at the moment.  Will get x-rays to further evaluate the varus deformity of the knees.  Discussed icing regimen of home exercises, increase activity slowly.  Discussed which activities to do and which ones to avoid.  Follow-up again in 6 to 12 weeks otherwise

## 2024-09-02 ENCOUNTER — Ambulatory Visit: Payer: Self-pay | Admitting: Family Medicine

## 2024-09-09 ENCOUNTER — Other Ambulatory Visit: Payer: Self-pay | Admitting: Cardiology

## 2024-09-09 DIAGNOSIS — E7800 Pure hypercholesterolemia, unspecified: Secondary | ICD-10-CM

## 2024-09-09 DIAGNOSIS — I6523 Occlusion and stenosis of bilateral carotid arteries: Secondary | ICD-10-CM

## 2024-09-09 DIAGNOSIS — I10 Essential (primary) hypertension: Secondary | ICD-10-CM

## 2024-09-09 DIAGNOSIS — I2510 Atherosclerotic heart disease of native coronary artery without angina pectoris: Secondary | ICD-10-CM

## 2024-09-13 ENCOUNTER — Other Ambulatory Visit: Payer: Self-pay

## 2024-09-13 DIAGNOSIS — I10 Essential (primary) hypertension: Secondary | ICD-10-CM

## 2024-09-13 DIAGNOSIS — E7800 Pure hypercholesterolemia, unspecified: Secondary | ICD-10-CM

## 2024-09-13 DIAGNOSIS — I2510 Atherosclerotic heart disease of native coronary artery without angina pectoris: Secondary | ICD-10-CM

## 2024-09-13 DIAGNOSIS — I6523 Occlusion and stenosis of bilateral carotid arteries: Secondary | ICD-10-CM

## 2024-09-14 ENCOUNTER — Telehealth: Payer: Self-pay | Admitting: Cardiology

## 2024-09-14 NOTE — Telephone Encounter (Signed)
*  STAT* If patient is at the pharmacy, call can be transferred to refill team.   1. Which medications need to be refilled? (please list name of each medication and dose if known)   amLODipine  (NORVASC ) 5 MG tablet     2. Would you like to learn more about the convenience, safety, & potential cost savings by using the [facility]? no    3. Are you open to using the [facility] ([facility] no    4. Which pharmacy/location (including street and city if local pharmacy) is medication to be sent to?[id number] - [place], [place] - [address]   5. Do they need a 30 day or 90 day supply? 90 day

## 2024-09-14 NOTE — Telephone Encounter (Signed)
 Returned call to pt and have made him aware that we sent in a 90 rx Amlodipine  to [facility], 08/16/24 with 3 additional refills.  Pt was appreciative for the call back.

## 2024-09-16 ENCOUNTER — Other Ambulatory Visit: Payer: Self-pay | Admitting: Cardiology

## 2024-09-16 DIAGNOSIS — E7800 Pure hypercholesterolemia, unspecified: Secondary | ICD-10-CM

## 2024-09-16 DIAGNOSIS — I6523 Occlusion and stenosis of bilateral carotid arteries: Secondary | ICD-10-CM

## 2024-09-16 DIAGNOSIS — I10 Essential (primary) hypertension: Secondary | ICD-10-CM

## 2024-09-16 DIAGNOSIS — I2510 Atherosclerotic heart disease of native coronary artery without angina pectoris: Secondary | ICD-10-CM

## 2024-09-27 ENCOUNTER — Ambulatory Visit: Admitting: Family Medicine

## 2024-09-27 ENCOUNTER — Telehealth: Payer: Self-pay | Admitting: Family Medicine

## 2024-09-27 ENCOUNTER — Ambulatory Visit

## 2024-09-27 ENCOUNTER — Other Ambulatory Visit: Payer: Self-pay | Admitting: Family Medicine

## 2024-09-27 ENCOUNTER — Ambulatory Visit
Admission: RE | Admit: 2024-09-27 | Discharge: 2024-09-27 | Disposition: A | Source: Ambulatory Visit | Attending: Family Medicine | Admitting: Family Medicine

## 2024-09-27 ENCOUNTER — Other Ambulatory Visit: Payer: Self-pay

## 2024-09-27 VITALS — BP 142/64 | HR 69 | Ht 65.0 in

## 2024-09-27 DIAGNOSIS — R0789 Other chest pain: Secondary | ICD-10-CM | POA: Diagnosis not present

## 2024-09-27 DIAGNOSIS — M48062 Spinal stenosis, lumbar region with neurogenic claudication: Secondary | ICD-10-CM | POA: Diagnosis not present

## 2024-09-27 DIAGNOSIS — S299XXA Unspecified injury of thorax, initial encounter: Secondary | ICD-10-CM | POA: Diagnosis not present

## 2024-09-27 DIAGNOSIS — M25559 Pain in unspecified hip: Secondary | ICD-10-CM | POA: Diagnosis not present

## 2024-09-27 MED ORDER — HYDROCODONE-ACETAMINOPHEN 5-325 MG PO TABS: 1.0000 | ORAL_TABLET | Freq: Three times a day (TID) | ORAL | 0 refills | Status: AC | PRN

## 2024-09-27 NOTE — Assessment & Plan Note (Signed)
 Likely nondisplaced rib fractures noted.  Difficult to assess with patient's other arthritic changes.  Discussed with patient about icing regimen and home exercises, increase slowly.  Follow-up with me in RICE.  Patient knows if worsening shortness of breath to seek medical attention.  Patient is to take 10 deep breaths every hour while awake.

## 2024-09-27 NOTE — Telephone Encounter (Signed)
 Spoke with patient that he is to not use Tylenol  while on Norco.

## 2024-09-27 NOTE — Telephone Encounter (Signed)
 Patient called and asked what medication you're not supposed to take with his prescription. Please give patient a call back. Please advise.

## 2024-09-27 NOTE — Patient Instructions (Addendum)
 Hold Tylenol  when taking [employer] [phone] We will be in touch

## 2024-09-27 NOTE — Telephone Encounter (Signed)
 Patient called stating that he fell down the stairs 3 days ago. He said that he has been having leg and back pain. But also mentioned he thinks he broke some ribs because it is painful to breathe. Then this morning, he fell getting out of bed. He would like to see Dr Claudene as soon as possible. I have him scheduled tomorrow at 4pm.  Please advise if something further should be done?

## 2024-09-27 NOTE — Telephone Encounter (Signed)
 Patient scheduled today

## 2024-09-27 NOTE — Assessment & Plan Note (Signed)
 Known history of spinal stenosis questionable L4 compression fracture.  Patient is having some radicular symptoms left greater than right.  Discussed with patient about icing regimen and home exercises, discussed with patient that I do feel a CT scan is necessary with the amount of bruising and swelling in the soft tissue surrounding the sacroiliac joint.  Patient is not on a blood thinner at the moment.  We discussed abdominal pain worsening to go to the emergency room but is having no abdominal pain at the time.  Is having some mild shortness of breath secondary to likely a rib fracture noted.  Given Norco and warned of potential side effects and not to take his additional Tylenol  when taking this.  Increase activity slowly.  Follow-up again in 6 to 12 weeks

## 2024-09-27 NOTE — Progress Notes (Signed)
 [name] [address] [place] [address] Phone: [phone] Subjective:   [name], am serving as a scribe for [name].  I'm seeing this patient by the request  of:  [name] BRAVO, MD  CC: Pain after fall.  YEP:Dlagzrupcz  [name] is a 89 y.o. male coming in with complaint of pain in ribs. Tripped up the stairs and then fell backwards down 14 stairs. Patient feel 4 days ago. Pain in L side of rib cage. Has a hard time breathing and coughing causes intense pain.   Pain in L glute and SI joint when he walks.   Onset-  Location Duration-  Character- Aggravating factors- Reliving factors-  Therapies tried-  Severity-     Past Medical History:  Diagnosis Date   Allergy    Arthritis    scattered (10/26/2018)   Asthma    C. difficile diarrhea    CAD (coronary artery disease) 2005   s/p PCI of RCA   Carotid stenosis    Carotid dopplers showed 1-39% right and 40-59% left carotid artery stenosis 01/2024   Cataract    NS OD   Cognitive impairment, mild, so stated    Colitis    Erectile dysfunction    GERD (gastroesophageal reflux disease)    Hyperlipidemia    Hypertension    Perforated stomach, acute 10/23/2018   Peripheral vascular disease    Retinal detachment    Vertebrobasilar artery syndrome    Vertigo    Past Surgical History:  Procedure Laterality Date   APPENDECTOMY  1954   CATARACT EXTRACTION Left 08/08/2019   Dr. Camillo   COLONOSCOPY     ESOPHAGOGASTRODUODENOSCOPY (EGD) WITH ESOPHAGEAL DILATION  09/2018   EYE SURGERY     LAPAROTOMY N/A 10/23/2018   Procedure: EXPLORATORY LAPAROTOMY WITH GRAHAM PATCH FOR PERFORATED ULCER AND PLACEMENT OF GASTRIC TUBE;  Surgeon: Robert Cough, MD;  Location: [facility] OR;  Service: General;  Laterality: N/A;   NASAL SINUS SURGERY  05/2018   RETINAL DETACHMENT SURGERY     ? side   UPPER GASTROINTESTINAL ENDOSCOPY     Social History   Socioeconomic  History   Marital status: Married    Spouse name: Not on file   Number of children: 3   Years of education: Not on file   Highest education level: Master's degree (e.g., MA, MS, MEng, MEd, MSW, MBA)  Occupational History   Occupation: Retired Teaching Laboratory Technician: RETIRED  Tobacco Use   Smoking status: Former    Current packs/day: 0.00    Types: Cigars, Cigarettes    Quit date: 05/13/1961    Years since quitting: 63.4    Passive exposure: Never   Smokeless tobacco: Never   Tobacco comments:    Smokes cigars 65 years ago. no inhaling.  Vaping Use   Vaping status: Never Used  Substance and Sexual Activity   Alcohol use: Not Currently    Alcohol/week: 1.0 standard drink of alcohol    Types: 1 Glasses of wine per week   Drug use: Never   Sexual activity: Not Currently  Other Topics Concern   Not on file  Social History Narrative   Married with 1 son and 2 daughters.  Retired.   2 caffeinated beverages daily, 3 glasses of wine daily   Never smoker no drug use no tobacco   Social Drivers of Corporate Investment Banker Strain: Low Risk  (05/31/2024)  Overall Financial Resource Strain (CARDIA)    Difficulty of Paying Living Expenses: Not hard at all  Food Insecurity: No Food Insecurity (05/31/2024)   Hunger Vital Sign    Worried About Running Out of Food in the Last Year: Never true    Ran Out of Food in the Last Year: Never true  Transportation Needs: No Transportation Needs (05/31/2024)   PRAPARE - Administrator, Civil Service (Medical): No    Lack of Transportation (Non-Medical): No  Physical Activity: Insufficiently Active (05/31/2024)   Exercise Vital Sign    Days of Exercise per Week: 1 day    Minutes of Exercise per Session: 10 min  Stress: No Stress Concern Present (05/31/2024)   [employer] of Occupational Health - Occupational Stress Questionnaire    Feeling of Stress: Not at all  Social Connections: Moderately Isolated (05/31/2024)   Social  Connection and Isolation Panel    Frequency of Communication with Friends and Family: More than three times a week    Frequency of Social Gatherings with Friends and Family: Twice a week    Attends Religious Services: Patient declined    Database Administrator or Organizations: No    Attends Engineer, Structural: Not on file    Marital Status: Married   Allergies  Allergen Reactions   Neosporin [Bacitracin-Polymyxin B] Other (See Comments)    Redness   Family History  Problem Relation Age of Onset   Stroke Mother    Hyperlipidemia Mother    Hypertension Mother    Deep vein thrombosis Mother    Stroke Father    Hyperlipidemia Father    Hypertension Father    Deep vein thrombosis Father    Diverticulitis Father    Ulcerative colitis Father    Colon polyps Father    Colon cancer Neg Hx    Esophageal cancer Neg Hx    Stomach cancer Neg Hx    Rectal cancer Neg Hx      Current Outpatient Medications (Cardiovascular):    amLODipine  (NORVASC ) 5 MG tablet, TAKE 1 TABLET(5 MG) BY MOUTH DAILY   atorvastatin  (LIPITOR ) 80 MG tablet, TAKE 1 TABLET(80 MG) BY MOUTH DAILY   carvedilol  (COREG ) 6.25 MG tablet, Take 1 tablet (6.25 mg total) by mouth 2 (two) times daily with a meal.   hydrochlorothiazide  (HYDRODIURIL ) 25 MG tablet, TAKE 1 TABLET(25 MG) BY MOUTH DAILY  Current Outpatient Medications (Respiratory):    albuterol  (PROVENTIL ) (2.5 MG/3ML) 0.083% nebulizer solution, Take 3 mLs (2.5 mg total) by nebulization every 6 (six) hours as needed for wheezing or shortness of breath.   albuterol  (VENTOLIN  HFA) 108 (90 Base) MCG/ACT inhaler, Inhale 1-2 puffs into the lungs every 4 (four) hours as needed for wheezing or shortness of breath. Patient need to keep office visit to get more refill's .   azelastine  (ASTELIN ) 0.1 % nasal spray, USE 1 SPRAY IN EACH NOSTRIL TWICE DAILY   fluticasone  (FLONASE ) 50 MCG/ACT nasal spray, 1 spray each nostril twice daily   fluticasone   furoate-vilanterol (BREO ELLIPTA ) 100-25 MCG/ACT AEPB, Inhale 1 puff into the lungs daily.   montelukast  (SINGULAIR ) 10 MG tablet, Take 1 tablet (10 mg total) by mouth at bedtime.  Current Outpatient Medications (Analgesics):    acetaminophen  (TYLENOL ) 500 MG tablet, Take 500 mg by mouth 2 (two) times daily.   aspirin  EC 81 MG tablet, Take 81 mg by mouth daily.   HYDROcodone -acetaminophen  (NORCO/VICODIN) 5-325 MG tablet, Take 1 tablet by mouth every 8 (eight)  hours as needed for moderate pain (pain score 4-6).   Current Outpatient Medications (Other):    cholecalciferol (VITAMIN D3) 25 MCG (1000 UT) tablet, Take 1,000 Units by mouth daily.   donepezil  (ARICEPT ) 10 MG tablet, TAKE 1 TABLET BY MOUTH DAILY   doxycycline  (ADOXA) 100 MG tablet, Take 100 mg by mouth 2 (two) times daily.   Eyelid Cleansers (OCUSOFT EYELID CLEANSING) PADS, Place 1 application into both eyes 2 (two) times daily.   fish oil-omega-3 fatty acids  1000 MG capsule, Take 2 capsules (2 g total) by mouth 2 (two) times daily.   gabapentin  (NEURONTIN ) 300 MG capsule, TAKE 1 CAPSULE(300 MG) BY MOUTH THREE TIMES DAILY   Polyvinyl Alcohol-Povidone (REFRESH OP), Place 1 drop into both eyes 2 (two) times daily.   Saw Palmetto 500 MG CAPS, Take 1,000 mg by mouth 2 (two) times daily.    Reviewed prior external information including notes and imaging from  primary care provider As well as notes that were available from care everywhere and other healthcare systems.  Past medical history, social, surgical and family history all reviewed in electronic medical record.  No pertanent information unless stated regarding to the chief complaint.   Review of Systems:  No headache, visual changes, nausea, vomiting, diarrhea, constipation, dizziness, abdominal pain, skin rash, fevers, chills, night sweats, weight loss, swollen lymph nodes,  joint swelling, chest pain, shortness of breath, mood changes. POSITIVE muscle aches, body  aches  Objective  Blood pressure (!) 142/64, pulse 69, height 5' 5 (1.651 m), SpO2 97%.   General: No apparent distress alert and oriented x3 mood and affect normal, dressed appropriately.  Appears uncomfortable HEENT: Pupils equal, extraocular movements intact  Respiratory: Patient's speak in full sentences and does not appear short of breath  Cardiovascular: No lower extremity edema, non tender, no erythema  Patient when standing does have difficulty going from seated to standing position.  Severely tender to palpation in the midline in the L5-S1 area.  On inspection significant bruising and a hematoma of the soft tissue in the area.  Neurovascularly intact distally. Left rib around the 6th and 7th rib midline on the axillary area seems to have tenderness noted.  No crepitus.  No bruising noted.  Worsening pain with inhalation.   Impression and Recommendations:     The above documentation has been reviewed and is accurate and complete Dani Danis M Chastidy Ranker, DO

## 2024-09-28 ENCOUNTER — Ambulatory Visit: Payer: Self-pay | Admitting: Family Medicine

## 2024-09-28 ENCOUNTER — Ambulatory Visit: Admitting: Family Medicine

## 2024-09-29 ENCOUNTER — Other Ambulatory Visit: Payer: Self-pay | Admitting: Cardiology

## 2024-09-30 ENCOUNTER — Other Ambulatory Visit: Payer: Self-pay | Admitting: Cardiology

## 2024-10-04 ENCOUNTER — Ambulatory Visit: Admitting: Family Medicine

## 2024-10-04 ENCOUNTER — Ambulatory Visit: Payer: Self-pay | Admitting: Family Medicine

## 2024-10-06 ENCOUNTER — Telehealth: Payer: Self-pay | Admitting: Cardiology

## 2024-10-06 NOTE — Telephone Encounter (Signed)
°*  STAT* If patient is at the pharmacy, call can be transferred to refill team.   1. Which medications need to be refilled? (please list name of each medication and dose if known) hydrochlorothiazide  (HYDRODIURIL ) 25 MG tablet    2. Would you like to learn more about the convenience, safety, & potential cost savings by using the [facility]? No    3. Are you open to using the [facility] (Type [facility]  ). No    4. Which pharmacy/location (including street and city if local pharmacy) is medication to be sent to?[id number] - [place], [place] - [address]    5. Do they need a 30 day or 90 day supply? 90 day

## 2024-10-07 NOTE — Telephone Encounter (Signed)
 Placed call to pt to make him aware refill was sent in on 10/03/24.

## 2024-10-07 NOTE — Telephone Encounter (Signed)
 Refill was sent 10/03/24.

## 2024-10-10 ENCOUNTER — Telehealth: Payer: Self-pay | Admitting: Cardiology

## 2024-10-10 ENCOUNTER — Other Ambulatory Visit: Payer: Self-pay | Admitting: Cardiology

## 2024-10-10 NOTE — Telephone Encounter (Signed)
°*  STAT* If patient is at the pharmacy, call can be transferred to refill team.   1. Which medications need to be refilled? (please list name of each medication and dose if known)   hydrochlorothiazide  (HYDRODIURIL ) 25 MG tablet  2. Would you like to learn more about the convenience, safety, & potential cost savings by using the [facility]? no   3. Are you open to using the [facility] ([facility]. no   4. Which pharmacy/location (including street and city if local pharmacy) is medication to be sent to?  [id number] - [place], [place] - [address]    5. Do they need a 30 day or 90 day supply? 90 day.    Pt completely out.

## 2024-10-11 NOTE — Telephone Encounter (Signed)
 Called [facility] and made sure pt had refills, spoke with Shayna, and pt has 3 .

## 2024-10-11 NOTE — Telephone Encounter (Signed)
 Refill was sent 10/03/24.

## 2024-10-11 NOTE — Telephone Encounter (Signed)
 Call placed to pt, he has been made aware that he has a new rx at 10/03/24 with 3 refills, good through until 2026.

## 2024-10-17 NOTE — Progress Notes (Signed)
 " [facility] [address] [place] [address] Phone: [phone] Subjective:   [name] [name], am serving as a scribe for Dr. Arthea Claudene.  I'm seeing this patient by the request  of:  Mahlon Comer BRAVO, MD  CC: Left hip pain  YEP:Dlagzrupcz  09/27/2024 Likely nondisplaced rib fractures noted.  Difficult to assess with patient's other arthritic changes.  Discussed with patient about icing regimen and home exercises, increase slowly.  Follow-up with me in RICE.  Patient knows if worsening shortness of breath to seek medical attention.  Patient is to take 10 deep breaths every hour while awake.     Known history of spinal stenosis questionable L4 compression fracture.  Patient is having some radicular symptoms left greater than right.  Discussed with patient about icing regimen and home exercises, discussed with patient that I do feel a CT scan is necessary with the amount of bruising and swelling in the soft tissue surrounding the sacroiliac joint.  Patient is not on a blood thinner at the moment.  We discussed abdominal pain worsening to go to the emergency room but is having no abdominal pain at the time.  Is having some mild shortness of breath secondary to likely a rib fracture noted.  Given Norco and warned of potential side effects and not to take his additional Tylenol  when taking this.  Increase activity slowly.  Follow-up again in 6 to 12 weeks     Update 10/19/2024 [name] is a 89 y.o. male coming in with complaint of rib and pelvic pain. Patient states that he has been having severe hip pain since the fall. Pain in L glute medius after sitting for a while.   Rib pain has improved but still present.       Past Medical History:  Diagnosis Date   Allergy    Arthritis    scattered (10/26/2018)   Asthma    C. difficile diarrhea    CAD (coronary artery disease) 2005   s/p PCI of RCA   Carotid stenosis    Carotid dopplers showed  1-39% right and 40-59% left carotid artery stenosis 01/2024   Cataract    NS OD   Cognitive impairment, mild, so stated    Colitis    Erectile dysfunction    GERD (gastroesophageal reflux disease)    Hyperlipidemia    Hypertension    Perforated stomach, acute 10/23/2018   Peripheral vascular disease    Retinal detachment    Vertebrobasilar artery syndrome    Vertigo    Past Surgical History:  Procedure Laterality Date   APPENDECTOMY  1954   CATARACT EXTRACTION Left 08/08/2019   Dr. Camillo   COLONOSCOPY     ESOPHAGOGASTRODUODENOSCOPY (EGD) WITH ESOPHAGEAL DILATION  09/2018   EYE SURGERY     LAPAROTOMY N/A 10/23/2018   Procedure: EXPLORATORY LAPAROTOMY WITH GRAHAM PATCH FOR PERFORATED ULCER AND PLACEMENT OF GASTRIC TUBE;  Surgeon: Ebbie Cough, MD;  Location: [facility] OR;  Service: General;  Laterality: N/A;   NASAL SINUS SURGERY  05/2018   RETINAL DETACHMENT SURGERY     ? 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 no inhaling.  Vaping Use   Vaping status: Never Used  Substance and Sexual Activity   Alcohol use: Not Currently    Alcohol/week: 1.0 standard drink of alcohol    Types: 1 Glasses of wine per week   Drug use: Never   Sexual activity: Not Currently  Other Topics Concern   Not on file  Social History Narrative   Married with 1 son and 2 daughters.  Retired.   2 caffeinated beverages  daily, 3 glasses of wine daily   Never smoker no drug use no tobacco   Social Drivers of Health   Tobacco Use: Medium Risk (08/15/2024)   Patient History    Smoking Tobacco Use: Former    Smokeless Tobacco Use: Never    Passive Exposure: Never  Physicist, Medical Strain: Low Risk (05/31/2024)   Overall Financial Resource Strain (CARDIA)    Difficulty of Paying Living Expenses: Not hard at all  Food Insecurity: No Food Insecurity (05/31/2024)   Epic    Worried About Radiation Protection Practitioner of Food in the Last Year: Never true    Ran Out of Food in the Last Year: Never true  Transportation Needs: No Transportation Needs (05/31/2024)   Epic    Lack of Transportation (Medical): No    Lack of Transportation (Non-Medical): No  Physical Activity: Insufficiently Active (05/31/2024)   Exercise Vital Sign    Days of Exercise per Week: 1 day    Minutes of Exercise per Session: 10 min  Stress: No Stress Concern Present (05/31/2024)   [employer] of Occupational Health - Occupational Stress Questionnaire    Feeling of Stress: Not at all  Social Connections: Moderately Isolated (05/31/2024)   Social Connection and Isolation Panel    Frequency of Communication with Friends and Family: More than three times a week    Frequency of Social Gatherings with Friends and Family: Twice a week    Attends Religious Services: Patient declined    Database Administrator or Organizations: No    Attends Engineer, Structural: Not on file    Marital Status: Married  Depression (PHQ2-9): Low Risk (07/15/2024)   Depression (PHQ2-9)    PHQ-2 Score: 0  Alcohol Screen: Low Risk (05/31/2024)   Alcohol Screen    Last Alcohol Screening Score (AUDIT): 2  Housing: Unknown (05/31/2024)   Epic    Unable to Pay for Housing in the Last Year: No    Number of Times Moved in the Last Year: Not on file    Homeless in the Last Year: No  Utilities: Not At Risk (04/05/2024)   [employer] Utilities    Threatened with loss of utilities: No   Health Literacy: Adequate Health Literacy (04/05/2024)   B1300 Health Literacy    Frequency of need for help with medical instructions: Never   Allergies[1] Family History  Problem Relation Age of Onset   Stroke Mother    Hyperlipidemia Mother    Hypertension Mother    Deep vein thrombosis Mother    Stroke Father    Hyperlipidemia Father    Hypertension Father    Deep vein thrombosis Father  Diverticulitis Father    Ulcerative colitis Father    Colon polyps Father    Colon cancer Neg Hx    Esophageal cancer Neg Hx    Stomach cancer Neg Hx    Rectal cancer Neg Hx     Current Outpatient Medications (Cardiovascular):    amLODipine  (NORVASC ) 5 MG tablet, TAKE 1 TABLET(5 MG) BY MOUTH DAILY   atorvastatin  (LIPITOR ) 80 MG tablet, TAKE 1 TABLET(80 MG) BY MOUTH DAILY   carvedilol  (COREG ) 6.25 MG tablet, Take 1 tablet (6.25 mg total) by mouth 2 (two) times daily with a meal.   hydrochlorothiazide  (HYDRODIURIL ) 25 MG tablet, TAKE 1 TABLET(25 MG) BY MOUTH DAILY  Current Outpatient Medications (Respiratory):    albuterol  (PROVENTIL ) (2.5 MG/3ML) 0.083% nebulizer solution, Take 3 mLs (2.5 mg total) by nebulization every 6 (six) hours as needed for wheezing or shortness of breath.   albuterol  (VENTOLIN  HFA) 108 (90 Base) MCG/ACT inhaler, Inhale 1-2 puffs into the lungs every 4 (four) hours as needed for wheezing or shortness of breath. Patient need to keep office visit to get more refill's .   azelastine  (ASTELIN ) 0.1 % nasal spray, USE 1 SPRAY IN EACH NOSTRIL TWICE DAILY   fluticasone  (FLONASE ) 50 MCG/ACT nasal spray, 1 spray each nostril twice daily   fluticasone  furoate-vilanterol (BREO ELLIPTA ) 100-25 MCG/ACT AEPB, Inhale 1 puff into the lungs daily.   montelukast  (SINGULAIR ) 10 MG tablet, Take 1 tablet (10 mg total) by mouth at bedtime.  Current Outpatient Medications (Analgesics):    acetaminophen  (TYLENOL ) 500 MG tablet, Take 500 mg by mouth 2 (two) times daily.   aspirin  EC 81 MG  tablet, Take 81 mg by mouth daily.   HYDROcodone -acetaminophen  (NORCO/VICODIN) 5-325 MG tablet, Take 1 tablet by mouth every 8 (eight) hours as needed for moderate pain (pain score 4-6).  Current Outpatient Medications (Other):    cholecalciferol (VITAMIN D3) 25 MCG (1000 UT) tablet, Take 1,000 Units by mouth daily.   donepezil  (ARICEPT ) 10 MG tablet, TAKE 1 TABLET BY MOUTH DAILY   doxycycline  (ADOXA) 100 MG tablet, Take 100 mg by mouth 2 (two) times daily.   Eyelid Cleansers (OCUSOFT EYELID CLEANSING) PADS, Place 1 application into both eyes 2 (two) times daily.   fish oil-omega-3 fatty acids  1000 MG capsule, Take 2 capsules (2 g total) by mouth 2 (two) times daily.   gabapentin  (NEURONTIN ) 300 MG capsule, TAKE 1 CAPSULE(300 MG) BY MOUTH THREE TIMES DAILY   Polyvinyl Alcohol-Povidone (REFRESH OP), Place 1 drop into both eyes 2 (two) times daily.   Saw Palmetto 500 MG CAPS, Take 1,000 mg by mouth 2 (two) times daily.    Reviewed prior external information including notes and imaging from  primary care provider As well as notes that were available from care everywhere and other healthcare systems.  Past medical history, social, surgical and family history all reviewed in electronic medical record.  No pertanent information unless stated regarding to the chief complaint.   Review of Systems:  No headache, visual changes, nausea, vomiting, diarrhea, constipation, dizziness, abdominal pain, skin rash, fevers, chills, night sweats, weight loss, swollen lymph nodes, body aches, joint swelling, chest pain, shortness of breath, mood changes. POSITIVE muscle aches  Objective  Blood pressure 120/70, pulse 62, height 5' 5 (1.651 m), SpO2 97%.   General: No apparent distress alert and oriented x3 mood and affect normal, dressed appropriately.  HEENT: Pupils equal, extraocular movements intact  Respiratory: Patient's speak in full sentences and does not appear short of breath  Cardiovascular: No  lower extremity edema, non tender, no erythema  Ribs seem to be healing.  Able to take a deeper breaths. Left hip severe tenderness to palpation over the distal gluteal tendon.  Significant less bruising noted from previous exam.  Patient able to stand without any difficulty but does have a severely antalgic gait noted with any type of movement.    Procedure: Real-time Ultrasound Guided Injection of left gluteal area seroma  Device: GE Logiq Q7  Ultrasound guided injection is preferred based studies that show increased duration, increased effect, greater accuracy, decreased procedural pain, increased response rate, and decreased cost with ultrasound guided versus blind injection.  Verbal informed consent obtained.  Time-out conducted.  Noted no overlying erythema, induration, or other signs of local infection.  Skin prepped in a sterile fashion.  Local anesthesia: Topical Ethyl chloride.  With sterile technique and under real time ultrasound guidance:  Greater trochanteric area was visualized and patient's bursa was noted. A 22-gauge 3 inch needle was inserted and 4 cc of 0.5% Marcaine and 1 cc of Kenalog  40 mg/dL was injected. Pictures taken this was on the left side of the gluteal area Completed without difficulty  Pain immediately resolved suggesting accurate placement of the medication.  Advised to call if fevers/chills, erythema, induration, drainage, or persistent bleeding.  Images permanently stored  Impression: Technically successful ultrasound guided injection.   Impression and Recommendations:     The above documentation has been reviewed and is accurate and complete Arthea CHRISTELLA Sharps, DO       [1]  Allergies Allergen Reactions   Neosporin [Bacitracin-Polymyxin B] Other (See Comments)    Redness   "

## 2024-10-19 ENCOUNTER — Ambulatory Visit

## 2024-10-19 ENCOUNTER — Encounter: Payer: Self-pay | Admitting: Family Medicine

## 2024-10-19 ENCOUNTER — Other Ambulatory Visit: Payer: Self-pay

## 2024-10-19 ENCOUNTER — Ambulatory Visit: Admitting: Family Medicine

## 2024-10-19 VITALS — BP 120/70 | HR 62 | Ht 65.0 in

## 2024-10-19 DIAGNOSIS — R0789 Other chest pain: Secondary | ICD-10-CM

## 2024-10-19 DIAGNOSIS — M7602 Gluteal tendinitis, left hip: Secondary | ICD-10-CM

## 2024-10-19 DIAGNOSIS — M25552 Pain in left hip: Secondary | ICD-10-CM

## 2024-10-19 DIAGNOSIS — S299XXA Unspecified injury of thorax, initial encounter: Secondary | ICD-10-CM

## 2024-10-19 DIAGNOSIS — T792XXA Traumatic secondary and recurrent hemorrhage and seroma, initial encounter: Secondary | ICD-10-CM

## 2024-10-19 NOTE — Patient Instructions (Addendum)
 Injected glute med today Xray today See me again in 2 months

## 2024-10-19 NOTE — Assessment & Plan Note (Signed)
 Patient given injection and tolerated the procedure well, discussed icing regimen and home exercises, discussed which activities to do and which ones to avoid.  Increase activity slowly.  Discussed icing regimen.  Follow-up with me again in 6 to 12 weeks

## 2024-10-19 NOTE — Assessment & Plan Note (Signed)
 Rib fractures seem to be healing.  1 still seems to be displaced.  Able to take deep breaths.  Feels like he is making progress.  Discussed with him and his significant other that if any worsening to seek medical attention in the emergency room immediately especially with breathing

## 2024-10-19 NOTE — Assessment & Plan Note (Signed)
 Will get x-rays as well to make sure there is no significant bony abnormality that could be contributing.  CT scan was unremarkable previously

## 2024-10-22 ENCOUNTER — Other Ambulatory Visit: Payer: Self-pay | Admitting: Cardiology

## 2024-10-28 ENCOUNTER — Other Ambulatory Visit: Payer: Self-pay | Admitting: Cardiology

## 2024-11-02 ENCOUNTER — Other Ambulatory Visit: Payer: Self-pay | Admitting: Cardiology

## 2024-11-04 ENCOUNTER — Ambulatory Visit: Payer: Self-pay | Admitting: Family Medicine

## 2024-11-30 ENCOUNTER — Ambulatory Visit

## 2024-11-30 ENCOUNTER — Encounter: Payer: Self-pay | Admitting: Family Medicine

## 2024-11-30 ENCOUNTER — Ambulatory Visit (INDEPENDENT_AMBULATORY_CARE_PROVIDER_SITE_OTHER): Admitting: Family Medicine

## 2024-11-30 VITALS — BP 132/72 | HR 65 | Ht 65.0 in | Wt 143.0 lb

## 2024-11-30 DIAGNOSIS — R0789 Other chest pain: Secondary | ICD-10-CM

## 2024-11-30 DIAGNOSIS — M19011 Primary osteoarthritis, right shoulder: Secondary | ICD-10-CM

## 2024-11-30 DIAGNOSIS — S299XXA Unspecified injury of thorax, initial encounter: Secondary | ICD-10-CM

## 2024-11-30 DIAGNOSIS — M19019 Primary osteoarthritis, unspecified shoulder: Secondary | ICD-10-CM | POA: Insufficient documentation

## 2024-11-30 DIAGNOSIS — S299XXD Unspecified injury of thorax, subsequent encounter: Secondary | ICD-10-CM | POA: Diagnosis not present

## 2024-11-30 NOTE — Assessment & Plan Note (Signed)
 Right shoulder region.  Discussed with patient about range of motion, keeping hands within peripheral vision.  Discussed which activities to do and which ones to avoid.  Patient's rotator cuff mild weakness but symmetric to the contralateral side.  Do not feel advanced imaging is necessary.  Follow-up again in 6 to 12 weeks.

## 2024-11-30 NOTE — Assessment & Plan Note (Signed)
 I believe patient is well-healed at this time.  No other changes.  Will get 1 more x-ray to make sure nothing else to contribute.  Patient can increase activity as tolerated

## 2024-11-30 NOTE — Patient Instructions (Addendum)
 Keep hands in peripheral vision Do prescribed exercises at least 3x a week Voltaren  gel  See you again in 10 weeks

## 2024-12-02 ENCOUNTER — Ambulatory Visit: Payer: Self-pay | Admitting: Family Medicine

## 2025-01-12 ENCOUNTER — Ambulatory Visit: Admitting: Family Medicine

## 2025-01-30 ENCOUNTER — Ambulatory Visit (HOSPITAL_COMMUNITY)

## 2025-02-08 ENCOUNTER — Ambulatory Visit: Admitting: Family Medicine

## 2025-04-11 ENCOUNTER — Encounter
